# Patient Record
Sex: Female | Born: 1945
Health system: Southern US, Community
[De-identification: ages and names within clinical notes are randomized; demographics above are authoritative.]

## PROBLEM LIST (undated history)

## (undated) DIAGNOSIS — K828 Other specified diseases of gallbladder: Secondary | ICD-10-CM

## (undated) DIAGNOSIS — E785 Hyperlipidemia, unspecified: Secondary | ICD-10-CM

## (undated) DIAGNOSIS — I519 Heart disease, unspecified: Secondary | ICD-10-CM

## (undated) DIAGNOSIS — E119 Type 2 diabetes mellitus without complications: Secondary | ICD-10-CM

## (undated) DIAGNOSIS — S32010A Wedge compression fracture of first lumbar vertebra, initial encounter for closed fracture: Secondary | ICD-10-CM

## (undated) DIAGNOSIS — M8580 Other specified disorders of bone density and structure, unspecified site: Secondary | ICD-10-CM

## (undated) DIAGNOSIS — K219 Gastro-esophageal reflux disease without esophagitis: Secondary | ICD-10-CM

## (undated) DIAGNOSIS — I214 Non-ST elevation (NSTEMI) myocardial infarction: Secondary | ICD-10-CM

## (undated) DIAGNOSIS — R55 Syncope and collapse: Secondary | ICD-10-CM

## (undated) HISTORY — DX: Other specified disorders of bone density and structure, unspecified site: M85.80

## (undated) HISTORY — DX: Other specified diseases of gallbladder: K82.8

## (undated) HISTORY — PX: TONSILLECTOMY AND ADENOIDECTOMY: SUR1326

## (undated) HISTORY — DX: Hyperlipidemia, unspecified: E78.5

## (undated) HISTORY — DX: Heart disease, unspecified: I51.9

## (undated) HISTORY — PX: OTHER SURGICAL HISTORY: SHX169

---

## 2000-08-29 ENCOUNTER — Other Ambulatory Visit: Admission: RE | Admit: 2000-08-29 | Discharge: 2000-08-29 | Payer: Self-pay | Admitting: Obstetrics and Gynecology

## 2001-11-16 ENCOUNTER — Emergency Department (HOSPITAL_COMMUNITY): Admission: EM | Admit: 2001-11-16 | Discharge: 2001-11-16 | Payer: Self-pay | Admitting: Emergency Medicine

## 2005-03-30 ENCOUNTER — Ambulatory Visit: Payer: Self-pay | Admitting: Internal Medicine

## 2006-10-09 DIAGNOSIS — M8580 Other specified disorders of bone density and structure, unspecified site: Secondary | ICD-10-CM

## 2006-10-11 ENCOUNTER — Encounter: Payer: Self-pay | Admitting: Internal Medicine

## 2006-10-11 ENCOUNTER — Ambulatory Visit: Payer: Self-pay | Admitting: Internal Medicine

## 2006-10-11 DIAGNOSIS — M810 Age-related osteoporosis without current pathological fracture: Secondary | ICD-10-CM | POA: Insufficient documentation

## 2006-10-29 ENCOUNTER — Ambulatory Visit: Payer: Self-pay | Admitting: Internal Medicine

## 2006-11-01 LAB — CONVERTED CEMR LAB
Bilirubin, Direct: 0.1 mg/dL (ref 0.0–0.3)
CO2: 24 meq/L (ref 19–32)
Cholesterol: 242 mg/dL (ref 0–200)
Direct LDL: 170.7 mg/dL
GFR calc Af Amer: 82 mL/min
GFR calc non Af Amer: 68 mL/min
Glucose, Bld: 101 mg/dL — ABNORMAL HIGH (ref 70–99)
HDL: 35.7 mg/dL — ABNORMAL LOW (ref >39.0)
Sodium: 141 meq/L (ref 135–145)
Total CHOL/HDL Ratio: 6.8
Total Protein: 7.2 g/dL (ref 6.0–8.3)
Triglycerides: 132 mg/dL (ref 0–149)

## 2006-11-02 ENCOUNTER — Encounter (INDEPENDENT_AMBULATORY_CARE_PROVIDER_SITE_OTHER): Payer: Self-pay | Admitting: *Deleted

## 2006-12-18 ENCOUNTER — Ambulatory Visit: Payer: Self-pay | Admitting: Internal Medicine

## 2006-12-18 DIAGNOSIS — E785 Hyperlipidemia, unspecified: Secondary | ICD-10-CM | POA: Insufficient documentation

## 2006-12-18 DIAGNOSIS — E1169 Type 2 diabetes mellitus with other specified complication: Secondary | ICD-10-CM | POA: Insufficient documentation

## 2006-12-18 LAB — CONVERTED CEMR LAB
Cholesterol, target level: 200 mg/dL
HDL goal, serum: 40 mg/dL
LDL Goal: 130 mg/dL

## 2008-04-21 ENCOUNTER — Telehealth (INDEPENDENT_AMBULATORY_CARE_PROVIDER_SITE_OTHER): Payer: Self-pay | Admitting: *Deleted

## 2008-08-13 ENCOUNTER — Telehealth (INDEPENDENT_AMBULATORY_CARE_PROVIDER_SITE_OTHER): Payer: Self-pay | Admitting: *Deleted

## 2011-04-15 LAB — HM DIABETES EYE EXAM

## 2012-07-23 DIAGNOSIS — N6489 Other specified disorders of breast: Secondary | ICD-10-CM | POA: Diagnosis not present

## 2012-07-24 DIAGNOSIS — L819 Disorder of pigmentation, unspecified: Secondary | ICD-10-CM | POA: Diagnosis not present

## 2012-07-24 DIAGNOSIS — L821 Other seborrheic keratosis: Secondary | ICD-10-CM | POA: Diagnosis not present

## 2012-07-24 DIAGNOSIS — L820 Inflamed seborrheic keratosis: Secondary | ICD-10-CM | POA: Diagnosis not present

## 2012-07-24 DIAGNOSIS — D239 Other benign neoplasm of skin, unspecified: Secondary | ICD-10-CM | POA: Diagnosis not present

## 2012-07-24 DIAGNOSIS — D2370 Other benign neoplasm of skin of unspecified lower limb, including hip: Secondary | ICD-10-CM | POA: Diagnosis not present

## 2012-07-24 DIAGNOSIS — L909 Atrophic disorder of skin, unspecified: Secondary | ICD-10-CM | POA: Diagnosis not present

## 2012-07-24 DIAGNOSIS — D485 Neoplasm of uncertain behavior of skin: Secondary | ICD-10-CM | POA: Diagnosis not present

## 2012-07-24 DIAGNOSIS — L570 Actinic keratosis: Secondary | ICD-10-CM | POA: Diagnosis not present

## 2012-08-23 DIAGNOSIS — Z124 Encounter for screening for malignant neoplasm of cervix: Secondary | ICD-10-CM | POA: Diagnosis not present

## 2012-08-23 DIAGNOSIS — B373 Candidiasis of vulva and vagina: Secondary | ICD-10-CM | POA: Diagnosis not present

## 2012-08-23 DIAGNOSIS — Z01419 Encounter for gynecological examination (general) (routine) without abnormal findings: Secondary | ICD-10-CM | POA: Diagnosis not present

## 2012-08-23 DIAGNOSIS — Z0000 Encounter for general adult medical examination without abnormal findings: Secondary | ICD-10-CM | POA: Diagnosis not present

## 2012-08-23 DIAGNOSIS — R81 Glycosuria: Secondary | ICD-10-CM | POA: Diagnosis not present

## 2012-08-26 ENCOUNTER — Telehealth: Payer: Self-pay | Admitting: Internal Medicine

## 2012-08-26 DIAGNOSIS — Z331 Pregnant state, incidental: Secondary | ICD-10-CM | POA: Diagnosis not present

## 2012-08-26 NOTE — Telephone Encounter (Signed)
Patient states that she used to be a patient of Dr. Alwyn Ren. She would like to start seeing Alfonse Flavors again but knows that it has been years since she was last seen. She says that Dr. Alwyn Ren does see her husband Aneta Mins. Is it okay to put her in as a new patient for [facility] or does she need to see someone else? Thanks!

## 2012-08-26 NOTE — Telephone Encounter (Signed)
Please schedule followup  Medicare Wellness appointment . Please bring all actual pill bottles and supplements.

## 2012-08-27 NOTE — Telephone Encounter (Signed)
Spoke w/pt. She is filling a cancellation we have for tomorrow, 08/28/12.

## 2012-08-28 ENCOUNTER — Ambulatory Visit (INDEPENDENT_AMBULATORY_CARE_PROVIDER_SITE_OTHER): Payer: Medicare Other | Admitting: Internal Medicine

## 2012-08-28 ENCOUNTER — Encounter: Payer: Self-pay | Admitting: Internal Medicine

## 2012-08-28 VITALS — BP 118/70 | HR 106 | Temp 98.1°F | Resp 14 | Ht 61.08 in | Wt 137.0 lb

## 2012-08-28 DIAGNOSIS — E119 Type 2 diabetes mellitus without complications: Secondary | ICD-10-CM | POA: Insufficient documentation

## 2012-08-28 DIAGNOSIS — Z23 Encounter for immunization: Secondary | ICD-10-CM | POA: Diagnosis not present

## 2012-08-28 DIAGNOSIS — Z0000 Encounter for general adult medical examination without abnormal findings: Secondary | ICD-10-CM | POA: Diagnosis not present

## 2012-08-28 DIAGNOSIS — R7309 Other abnormal glucose: Secondary | ICD-10-CM | POA: Diagnosis not present

## 2012-08-28 DIAGNOSIS — M899 Disorder of bone, unspecified: Secondary | ICD-10-CM

## 2012-08-28 DIAGNOSIS — Z8249 Family history of ischemic heart disease and other diseases of the circulatory system: Secondary | ICD-10-CM

## 2012-08-28 DIAGNOSIS — N1832 Chronic kidney disease, stage 3b: Secondary | ICD-10-CM | POA: Insufficient documentation

## 2012-08-28 DIAGNOSIS — E1159 Type 2 diabetes mellitus with other circulatory complications: Secondary | ICD-10-CM | POA: Insufficient documentation

## 2012-08-28 DIAGNOSIS — M949 Disorder of cartilage, unspecified: Secondary | ICD-10-CM

## 2012-08-28 DIAGNOSIS — E785 Hyperlipidemia, unspecified: Secondary | ICD-10-CM | POA: Diagnosis not present

## 2012-08-28 DIAGNOSIS — Z139 Encounter for screening, unspecified: Secondary | ICD-10-CM

## 2012-08-28 NOTE — Patient Instructions (Addendum)
Preventive Health Care: Exercise  30-45  minutes a day, 3-4 days a week. Walking is especially valuable in preventing Osteoporosis. Eat a low-fat diet with lots of fruits and vegetables, up to 7-9 servings per day. This would eliminate need for vitamin supplements for most individuals. Consume less than 30 grams of sugar per day from foods & drinks with High Fructose Corn Syrup as #2,3 or #4 on label. The legal document "Health Care Power of Attorney & Living Will " verifies decisions concerning your health care. As per the Standard of Care , screening Colonoscopy recommended @ 50 & every 5-10 years thereafter . More frequent monitor would be dictated by family history or findings @ Colonoscopy. To prevent fast heart rate,palpitations or premature beats, avoid stimulants such as decongestants, diet pills, nicotine, or caffeine (coffee, tea, cola, or chocolate) to excess.  Please  schedule fasting Labs : BMET,NMR LIPOPROFILE PANEL, hepatic panel,vitamin D 1,25 [place] D3,A1c, urine microalbumin, CBC & dif, TSH.PLEASE BRING THESE INSTRUCTIONS TO FOLLOW UP  LAB APPOINTMENT.This will guarantee correct labs are drawn, eliminating need for repeat blood sampling ( needle sticks ! ). Diagnoses /Codes: 272.4, V17.3, 733.90,790.29.

## 2012-08-28 NOTE — Progress Notes (Signed)
Subjective:    Patient ID: Kathryn Logan, female    DOB: 05-07-1946, 67 y.o.   MRN: [id number]  HPI Medicare Wellness Visit:  Psychosocial & medical history were reviewed as required by Medicare (abuse,antisocial behavioral risks,firearm risk).  Social history: caffeine:occasionally  , alcohol:rarely   ,  tobacco use:  never  Exercise :  See below No home & personal  safety / fall risk Activities of daily living: no limitations  Seatbelt  and smoke alarm employed. Power of Attorney/Living Will status : needed Ophthalmology exam current Hearing evaluation not current Orientation :oriented X 3  Memory & recall :good Spelling  testing:good Mood & affect : normal . Depression / anxiety: denied Travel history : last 2010 [place]  Immunization status :Shingles /Flu/ PNA/ tetanus needed Transfusion history:  none  Preventive health surveillance ( colonoscopy, BMD , mammograms,PAP as per protocol/ SOC):  colonoscopy & BMD due Dental care:  Every 6 mos. Chart reviewed &  Updated. Active issues reviewed & addressed.      Review of Systems Nonfasting glucose was over 300 @ hergynecologist's office recently. He urged her to have a complete exam. She is on a heart healthy diet; she exercises as dancing & weights 30 minutes > 5 times per week without symptoms. Specifically she denies chest pain, palpitations, dyspnea, or claudication. Family history is negative for premature coronary disease . Advanced cholesterol testing never performed.There is no PMH of statin use. No myalgias. No colonoscopy to date.No significant abdominal symptoms .     Objective:   Physical Exam Gen.: Healthy and well-nourished in appearance. Alert, appropriate and cooperative throughout exam. Appears younger than stated age  Head: Normocephalic without obvious abnormalities Eyes: No corneal or conjunctival inflammation noted. Pupils equal round reactive to light and accommodation.  Extraocular motion intact.   Ears: External  ear exam reveals no significant lesions or deformities. Canals clear .TMs normal. Hearing is grossly normal bilaterally. Nose: External nasal exam reveals no deformity or inflammation. Nasal mucosa are pink and moist. No lesions or exudates noted.  Mouth: Oral mucosa and oropharynx reveal no lesions or exudates. Teeth in good repair. Neck: No deformities, masses, or tenderness noted. Range of motion &  Thyroid normal. Lungs: Normal respiratory effort; chest expands symmetrically. Lungs are clear to auscultation without rales, wheezes, or increased work of breathing. Heart: Increased  rate and regular  rhythm. Normal S1 and S2. S4 gallop, click, or rub. No murmur. Abdomen: Bowel sounds normal; abdomen soft and nontender. No masses, organomegaly or hernias noted. Genitalia: As per Dr Ambrose Mantle, Gyn                                  Musculoskeletal/extremities: Slightly accentuated curvature of upper thoracic spine. No clubbing, cyanosis, edema, or significant extremity  deformity noted. Range of motion normal .Tone & strength  Normal. Joints normal . Nail health good. Able to lie down & sit up w/o help. Negative SLR bilaterally Vascular: Carotid, radial artery, dorsalis pedis and  posterior tibial pulses are full and equal. No bruits present. Neurologic: Alert and oriented x3. Deep tendon reflexes symmetrical and normal.      Skin: Intact without suspicious lesions or rashes. Lymph: No cervical, axillary lymphadenopathy present. Psych: Mood and affect are normal. Normally interactive  Assessment & Plan:  #1 Medicare Wellness Exam; criteria met ; data entered #2 Problem List reviewed ; Assessment/ Recommendations made Plan: see Orders

## 2012-08-29 ENCOUNTER — Ambulatory Visit (INDEPENDENT_AMBULATORY_CARE_PROVIDER_SITE_OTHER): Payer: Medicare Other | Admitting: *Deleted

## 2012-08-29 DIAGNOSIS — R7309 Other abnormal glucose: Secondary | ICD-10-CM | POA: Diagnosis not present

## 2012-08-29 DIAGNOSIS — M899 Disorder of bone, unspecified: Secondary | ICD-10-CM | POA: Diagnosis not present

## 2012-08-29 DIAGNOSIS — M949 Disorder of cartilage, unspecified: Secondary | ICD-10-CM | POA: Diagnosis not present

## 2012-08-29 DIAGNOSIS — Z139 Encounter for screening, unspecified: Secondary | ICD-10-CM | POA: Diagnosis not present

## 2012-08-29 DIAGNOSIS — Z8249 Family history of ischemic heart disease and other diseases of the circulatory system: Secondary | ICD-10-CM | POA: Diagnosis not present

## 2012-08-29 DIAGNOSIS — Z23 Encounter for immunization: Secondary | ICD-10-CM

## 2012-08-29 DIAGNOSIS — E785 Hyperlipidemia, unspecified: Secondary | ICD-10-CM | POA: Diagnosis not present

## 2012-08-29 LAB — CBC WITH DIFFERENTIAL/PLATELET
Basophils Absolute: 0 10*3/uL (ref 0.0–0.1)
HCT: 38.1 % (ref 36.0–46.0)
Lymphocytes Relative: 33.7 % (ref 12.0–46.0)
Lymphs Abs: 2.6 10*3/uL (ref 0.7–4.0)
Monocytes Relative: 5.6 % (ref 3.0–12.0)
Neutrophils Relative %: 57.8 % (ref 43.0–77.0)
Platelets: 280 10*3/uL (ref 150.0–400.0)
RDW: 14.9 % — ABNORMAL HIGH (ref 11.5–14.6)
WBC: 7.7 10*3/uL (ref 4.5–10.5)

## 2012-08-29 LAB — HEPATIC FUNCTION PANEL
Albumin: 4.1 g/dL (ref 3.5–5.2)
Total Bilirubin: 0.6 mg/dL (ref 0.3–1.2)

## 2012-08-29 LAB — BASIC METABOLIC PANEL
BUN: 21 mg/dL (ref 6–23)
Calcium: 9.5 mg/dL (ref 8.4–10.5)
GFR: 87.72 mL/min (ref >60.00)
Glucose, Bld: 235 mg/dL — ABNORMAL HIGH (ref 70–99)

## 2012-08-29 LAB — HEMOGLOBIN A1C: Hgb A1c MFr Bld: 10.2 % — ABNORMAL HIGH (ref 4.6–6.5)

## 2012-08-30 ENCOUNTER — Other Ambulatory Visit: Payer: Self-pay | Admitting: Internal Medicine

## 2012-08-30 DIAGNOSIS — M899 Disorder of bone, unspecified: Secondary | ICD-10-CM | POA: Diagnosis not present

## 2012-08-30 DIAGNOSIS — Z139 Encounter for screening, unspecified: Secondary | ICD-10-CM | POA: Diagnosis not present

## 2012-08-30 DIAGNOSIS — E785 Hyperlipidemia, unspecified: Secondary | ICD-10-CM | POA: Diagnosis not present

## 2012-08-30 DIAGNOSIS — Z8249 Family history of ischemic heart disease and other diseases of the circulatory system: Secondary | ICD-10-CM | POA: Diagnosis not present

## 2012-08-30 DIAGNOSIS — R7309 Other abnormal glucose: Secondary | ICD-10-CM | POA: Diagnosis not present

## 2012-08-30 LAB — MICROALBUMIN / CREATININE URINE RATIO: Microalb Creat Ratio: 0.8 mg/g (ref 0.0–30.0)

## 2012-08-30 MED ORDER — GLIMEPIRIDE 2 MG PO TABS: ORAL_TABLET | ORAL | Status: DC

## 2012-08-30 MED ORDER — METFORMIN HCL 500 MG PO TABS: 500.0000 mg | ORAL_TABLET | Freq: Two times a day (BID) | ORAL | Status: DC

## 2012-08-30 NOTE — Addendum Note (Signed)
Addended by: Silvio Pate D on: 08/30/2012 09:07 AM   Modules accepted: Orders

## 2012-09-02 ENCOUNTER — Encounter: Payer: Self-pay | Admitting: Internal Medicine

## 2012-09-02 LAB — VITAMIN D 1,25 DIHYDROXY
Vitamin D 1, 25 (OH)2 Total: 43 pg/mL (ref 18–72)
Vitamin D2 1, 25 (OH)2: <8 pg/mL
Vitamin D3 1, 25 (OH)2: 43 pg/mL

## 2012-09-02 LAB — NMR LIPOPROFILE WITH LIPIDS
Cholesterol, Total: 249 mg/dL — ABNORMAL HIGH (ref <200)
LDL Particle Number: 2747 nmol/L — ABNORMAL HIGH (ref <1000)
Large HDL-P: <1.3 umol/L — ABNORMAL LOW (ref >=4.8)
Large VLDL-P: 1.2 nmol/L (ref <=2.7)
Small LDL Particle Number: 1624 nmol/L — ABNORMAL HIGH (ref <=527)
VLDL Size: 42.9 nm (ref <=46.6)

## 2012-09-03 ENCOUNTER — Telehealth: Payer: Self-pay | Admitting: Internal Medicine

## 2012-09-03 NOTE — Telephone Encounter (Signed)
PT called regarding there blood sugar test. Please call pt as soon as possible because she is going out of town and wanted to know before she went. thanks

## 2012-09-03 NOTE — Telephone Encounter (Signed)
Spoke with patient, patient was encouraged to set up Mychart and she can have quicker access to her lab results. Patient verbalized understanding. Patient was given lab values and scheduled appointment for Monday for glucometer teaching. Patient aware copy of report(s) mailed

## 2012-09-09 ENCOUNTER — Ambulatory Visit (INDEPENDENT_AMBULATORY_CARE_PROVIDER_SITE_OTHER): Payer: Medicare Other | Admitting: Internal Medicine

## 2012-09-09 VITALS — HR 100 | Wt 137.0 lb

## 2012-09-09 MED ORDER — GLUCOSE BLOOD VI STRP: ORAL_STRIP | Status: DC

## 2012-09-09 MED ORDER — ONETOUCH DELICA LANCETS FINE MISC: 1.0000 | Status: DC

## 2012-09-09 NOTE — Patient Instructions (Signed)
Patient verbalized understanding of Blood Sugar machine teaching. Patient advised to check BS Fasting M/W/F/Sun and Tue (2 hours after breakfast), Thurs (2 hours after lunch), then Sat (2 hours after dinner). Patient to bring BS reading to appointment on May 20,2014. Patient to call if questions or concerns about Glucose Monitor teaching

## 2012-09-10 ENCOUNTER — Telehealth: Payer: Self-pay | Admitting: Internal Medicine

## 2012-09-10 DIAGNOSIS — Z8262 Family history of osteoporosis: Secondary | ICD-10-CM | POA: Diagnosis not present

## 2012-09-10 DIAGNOSIS — M899 Disorder of bone, unspecified: Secondary | ICD-10-CM | POA: Diagnosis not present

## 2012-09-10 NOTE — Telephone Encounter (Signed)
Patient has blood on a strip for a One Touch. She doesn't know how to use it, please call her.

## 2012-09-10 NOTE — Telephone Encounter (Signed)
Left message with patient's husband to have patient return call when available.  Called patient on cell phone, patient was placing the blood on the strip before placing it into the machine, patient was instructed to place strip into machine first then place blood on strip when prompted by machine. Patient verbalized understanding.

## 2012-09-10 NOTE — Telephone Encounter (Signed)
Left message on voicemail for patient to return call when available   

## 2012-09-10 NOTE — Progress Notes (Signed)
  Subjective:    Patient ID: Kathryn Logan, female    DOB: [birth year], 67 y.o.   MRN: [id number]  HPI    Review of Systems     Objective:   Physical Exam        Assessment & Plan:

## 2012-09-10 NOTE — Assessment & Plan Note (Signed)
Glucometer teaching completed

## 2012-09-15 ENCOUNTER — Encounter: Payer: Self-pay | Admitting: Internal Medicine

## 2012-09-27 DIAGNOSIS — L280 Lichen simplex chronicus: Secondary | ICD-10-CM | POA: Diagnosis not present

## 2012-10-01 ENCOUNTER — Encounter: Payer: Self-pay | Admitting: Internal Medicine

## 2012-10-01 ENCOUNTER — Ambulatory Visit: Payer: Medicare Other | Admitting: Internal Medicine

## 2012-10-08 ENCOUNTER — Encounter: Payer: Self-pay | Admitting: Internal Medicine

## 2012-10-08 ENCOUNTER — Ambulatory Visit (INDEPENDENT_AMBULATORY_CARE_PROVIDER_SITE_OTHER): Payer: Medicare Other | Admitting: Internal Medicine

## 2012-10-08 VITALS — BP 118/76 | HR 126 | Wt 133.4 lb

## 2012-10-08 DIAGNOSIS — M949 Disorder of cartilage, unspecified: Secondary | ICD-10-CM

## 2012-10-08 DIAGNOSIS — M899 Disorder of bone, unspecified: Secondary | ICD-10-CM | POA: Diagnosis not present

## 2012-10-08 DIAGNOSIS — E785 Hyperlipidemia, unspecified: Secondary | ICD-10-CM

## 2012-10-08 DIAGNOSIS — R7402 Elevation of levels of lactic acid dehydrogenase (LDH): Secondary | ICD-10-CM

## 2012-10-08 DIAGNOSIS — IMO0001 Reserved for inherently not codable concepts without codable children: Secondary | ICD-10-CM

## 2012-10-08 NOTE — Patient Instructions (Addendum)
Please  schedule fasting Labs in 12 weeks after continuing present exercise & nutrition changes: A1c, urine microalbumin,Lipids,AST,ALT. PLEASE BRING THESE INSTRUCTIONS TO FOLLOW UP  LAB APPOINTMENT.This will guarantee correct labs are drawn, eliminating need for repeat blood sampling ( needle sticks ! ). Diagnoses /Codes: 250.02,272.4.  Annual  retinal assessments are indicated because the diabetes.

## 2012-10-08 NOTE — Progress Notes (Signed)
  Subjective:    Patient ID: Kathryn Logan, female    DOB: 19-Sep-1945, 67 y.o.   MRN: [id number]  HPI Diabetes status assessment : Uncontrolled, pt has not filled medication yet Fasting or morning glucose range: 130- 204 Highest glucose 2 hours after any meal :150- 299. Hypoglycemia : none                                                                   Significant change in  Weight :4 lb weight loss since last visit .   Exercise : walking 5 days per week, one hour per occasion.  Nutrition/diet : Limiting sugar, simple carbohydrates, and sweets.                                                                                Medication compliance:  Has not began medication regimen. Adverse  Medication effects:  Have not started medication. Eye exam : Last exam in 2011 Foot care:  Never A1c/ urine microalbumin monitor:  Last A1C  10.2%, microalbumin ratio 0.8   Review of Systems   ROS: Excess thirst /  hunger  / urination:  none.  Lightheadedness with standing:none . Chest pain:  None . Palpitations: None . Pain in  calves with walking : None . Non healing skin  ulcers or sores :None.  Numbness , tingling or burning in feet :None .   Vision changes:  No      Objective:   Physical Exam  Appears healthy and well-nourished & in no acute distress  No carotid bruits are present.No neck pain distention present at 10 - 15 degrees. Thyroid normal to palpation  Heart rhythm and rate are normal with no significant murmurs or gallops.  Chest is clear with no increased work of breathing  There is no evidence of aortic aneurysm or renal artery bruits  Abdomen soft with no organomegaly or masses. No HJR  No clubbing, cyanosis or edema present.  Pedal pulses are intact   No ischemic skin changes are present . Nails healthy    Alert and oriented. Strength, tone, DTRs reflexes normal. Light touch normal over feet.          Assessment & Plan:  See Current Assessment & Plan in  Problem List under specific Diagnosis

## 2012-10-08 NOTE — Assessment & Plan Note (Signed)
She is engaged in a very aggressive lifestyle change program both nutritionally and with exercise. Metformin twice a day with the largest meals would be recommended for insulin sensitization. The A1c will be rechecked in 10-[redacted] weeks along with lipids

## 2012-10-08 NOTE — Assessment & Plan Note (Signed)
   Focus will be on continuing the weight bearing exercises which she is now doing 6 days a week along with continuing the calcium and vitamin D supplementation. Her concerns about negative dental impact was discussed. Bisphosphonate would not be necessary unless there is progression of bone loss. Long-term risk  related to osteopenia discussed, especially at the femoral neck

## 2012-10-08 NOTE — Assessment & Plan Note (Signed)
Statin will not be initiated until we see the response of her fasting lipids to be continuing the excellent nutritional and exercise program in  10-12 weeks.

## 2012-10-28 DIAGNOSIS — L280 Lichen simplex chronicus: Secondary | ICD-10-CM | POA: Diagnosis not present

## 2012-11-12 LAB — HM MAMMOGRAPHY

## 2012-12-17 DIAGNOSIS — L439 Lichen planus, unspecified: Secondary | ICD-10-CM | POA: Diagnosis not present

## 2012-12-17 DIAGNOSIS — L821 Other seborrheic keratosis: Secondary | ICD-10-CM | POA: Diagnosis not present

## 2012-12-17 DIAGNOSIS — L919 Hypertrophic disorder of the skin, unspecified: Secondary | ICD-10-CM | POA: Diagnosis not present

## 2012-12-17 DIAGNOSIS — D485 Neoplasm of uncertain behavior of skin: Secondary | ICD-10-CM | POA: Diagnosis not present

## 2012-12-17 DIAGNOSIS — D235 Other benign neoplasm of skin of trunk: Secondary | ICD-10-CM | POA: Diagnosis not present

## 2012-12-31 ENCOUNTER — Other Ambulatory Visit: Payer: Medicare Other

## 2013-01-23 ENCOUNTER — Other Ambulatory Visit: Payer: Medicare Other

## 2013-02-03 ENCOUNTER — Other Ambulatory Visit: Payer: Medicare Other

## 2013-02-18 ENCOUNTER — Other Ambulatory Visit: Payer: Medicare Other

## 2013-03-04 ENCOUNTER — Other Ambulatory Visit (INDEPENDENT_AMBULATORY_CARE_PROVIDER_SITE_OTHER): Payer: Medicare Other

## 2013-03-04 ENCOUNTER — Encounter: Payer: Self-pay | Admitting: Internal Medicine

## 2013-03-04 DIAGNOSIS — IMO0001 Reserved for inherently not codable concepts without codable children: Secondary | ICD-10-CM

## 2013-03-04 DIAGNOSIS — E785 Hyperlipidemia, unspecified: Secondary | ICD-10-CM | POA: Diagnosis not present

## 2013-03-04 LAB — ALT: ALT: 25 U/L (ref 0–35)

## 2013-03-04 LAB — HEMOGLOBIN A1C: Hgb A1c MFr Bld: 7 % — ABNORMAL HIGH (ref 4.6–6.5)

## 2013-03-04 LAB — LIPID PANEL
Cholesterol: 236 mg/dL — ABNORMAL HIGH (ref 0–200)
Triglycerides: 137 mg/dL (ref 0.0–149.0)

## 2013-03-04 LAB — MICROALBUMIN / CREATININE URINE RATIO: Microalb Creat Ratio: 0.7 mg/g (ref 0.0–30.0)

## 2013-03-05 ENCOUNTER — Encounter: Payer: Self-pay | Admitting: *Deleted

## 2013-03-05 NOTE — Progress Notes (Signed)
Letter mailed to patient.

## 2013-03-06 ENCOUNTER — Other Ambulatory Visit: Payer: Self-pay | Admitting: Internal Medicine

## 2013-03-06 ENCOUNTER — Telehealth: Payer: Self-pay | Admitting: General Practice

## 2013-03-06 NOTE — Telephone Encounter (Signed)
Pt called and is requesting that her recent labs be released to her myChart.

## 2013-03-06 NOTE — Telephone Encounter (Signed)
See notes on 03/04/13 lab reports ( ? Released through My Chart already?) .She needs F/U before refilling meds

## 2013-03-06 NOTE — Telephone Encounter (Signed)
Noted  

## 2013-03-07 NOTE — Telephone Encounter (Signed)
Med filled.  

## 2013-03-20 ENCOUNTER — Other Ambulatory Visit: Payer: Self-pay

## 2013-04-14 ENCOUNTER — Encounter: Payer: Self-pay | Admitting: Internal Medicine

## 2013-04-14 ENCOUNTER — Ambulatory Visit (INDEPENDENT_AMBULATORY_CARE_PROVIDER_SITE_OTHER): Payer: Medicare Other | Admitting: Internal Medicine

## 2013-04-14 VITALS — BP 142/89 | HR 109 | Temp 98.6°F | Ht 61.07 in | Wt 140.4 lb

## 2013-04-14 DIAGNOSIS — J209 Acute bronchitis, unspecified: Secondary | ICD-10-CM | POA: Diagnosis not present

## 2013-04-14 DIAGNOSIS — E785 Hyperlipidemia, unspecified: Secondary | ICD-10-CM

## 2013-04-14 MED ORDER — AZITHROMYCIN 250 MG PO TABS: ORAL_TABLET | ORAL | Status: DC

## 2013-04-14 MED ORDER — ATORVASTATIN CALCIUM 20 MG PO TABS: 20.0000 mg | ORAL_TABLET | Freq: Every day | ORAL | Status: DC

## 2013-04-14 NOTE — Progress Notes (Signed)
   Subjective:    Patient ID: Kathryn Logan, female    DOB: 09/26/1945, 67 y.o.   MRN: [id number]  HPI   Symptoms began 04/11/13 as rhinitis associated with itchy, watery eyes and sneezing.  As of 11/29 she developed chest congestion with production of yellow sputum. She also describes low-grade fever, chills, sweats. The only treatment to date was Mucinex.  She is only now returning to discuss DM & lipids    Review of Systems She specifically denies frontal or maxillary sinus area pain, nasal purulence, dental pain, otic pain, or otic discharge  The cough was not associated with shortness of breath or wheezing.  Her fasting glucoses are 124-157. Her A1c dropped from 10.2% down to the value of 710/21/14. Her wrist has dropped from the 104% down to 40%. The average sugar would drop from 285 down to 172.  Her LDL on 10/21 was 179 ; HDL 40.4; & Triglycerides were excellent at 137. A modified  heart healthy diet is followed; exercise is sporadic .  Family history is positive for premature coronary disease in her father. With DM  LDL goal is less than 100 ; ideally < 70 . To date no statin.  Low dose ASA taken Specifically denied are  chest pain, palpitations, dyspnea, or claudication.      Objective:   Physical Exam General appearance:good health ;well nourished; no acute distress or increased work of breathing is present.  No  lymphadenopathy about the head, neck, or axilla noted.   Eyes: No conjunctival inflammation or lid edema is present. There is no scleral icterus.  Ears:  External ear exam shows no significant lesions or deformities.  Otoscopic examination reveals clear canals, tympanic membranes are intact bilaterally without bulging, retraction, inflammation or discharge.  Nose:  External nasal examination shows no deformity or inflammation. Nasal mucosa are pink and moist without lesions or exudates. No septal dislocation or deviation.No obstruction to airflow.   Oral  exam: Dental hygiene is good; lips and gums are healthy appearing.There is no oropharyngeal erythema or exudate noted.   Neck:  No deformities, masses, or tenderness noted.   Supple with full range of motion without pain.   Heart:  Normal rate and regular rhythm. S1 and S2 normal without gallop, murmur, click, rub or other extra sounds.   Lungs:Chest clear to auscultation; no wheezes, rhonchi,rales ,or rubs present.No increased work of breathing.    Extremities:  No cyanosis, edema, or clubbing  noted    Skin: Warm & dry w/o jaundice or tenting.         Assessment & Plan:  #1 acute bronchitis w/o bronchospasm #2 DM improved #3 dyslipidemia Plan: See orders and recommendations

## 2013-04-14 NOTE — Progress Notes (Signed)
Pre visit review using our clinic review tool, if applicable. No additional management support is needed unless otherwise documented below in the visit note. 

## 2013-04-14 NOTE — Patient Instructions (Signed)
Fasting labs 07/11/13; OV 3-4 days later Plain Mucinex (NOT D) for thick secretions ;force NON dairy fluids .   Nasal cleansing in the shower as discussed with lather of mild shampoo.After 10 seconds wash off lather while  exhaling through nostrils. Make sure that all residual soap is removed to prevent irritation.  Nasacort AQ OTC 1 spray in each nostril twice a day as needed. Use the "crossover" technique into opposite nostril spraying toward opposite ear @ 45 degree angle, not straight up into nostril.  Use a Neti pot daily only  as needed for significant sinus congestion; going from open side to congested side . Plain Allegra (NOT D )  160 daily , Loratidine 10 mg , OR Zyrtec 10 mg @ bedtime  as needed for itchy eyes & sneezing.

## 2013-05-27 ENCOUNTER — Other Ambulatory Visit: Payer: Self-pay | Admitting: Internal Medicine

## 2013-05-27 NOTE — Telephone Encounter (Signed)
Metformin refilled per protocol. JG//CMA

## 2013-06-17 ENCOUNTER — Telehealth: Payer: Self-pay

## 2013-06-17 NOTE — Telephone Encounter (Signed)
The patient called and is hoping to get an rx called in for congestion.  She states this same medication was called in for a while ago for the same problem   Callback [phone]

## 2013-06-18 ENCOUNTER — Encounter: Payer: Self-pay | Admitting: Internal Medicine

## 2013-06-18 ENCOUNTER — Other Ambulatory Visit (INDEPENDENT_AMBULATORY_CARE_PROVIDER_SITE_OTHER): Payer: Medicare Other

## 2013-06-18 ENCOUNTER — Other Ambulatory Visit: Payer: Self-pay | Admitting: Internal Medicine

## 2013-06-18 ENCOUNTER — Ambulatory Visit (INDEPENDENT_AMBULATORY_CARE_PROVIDER_SITE_OTHER): Payer: Medicare Other | Admitting: Internal Medicine

## 2013-06-18 VITALS — BP 118/80 | HR 121 | Temp 97.4°F | Wt 139.0 lb

## 2013-06-18 DIAGNOSIS — R000 Tachycardia, unspecified: Secondary | ICD-10-CM | POA: Diagnosis not present

## 2013-06-18 DIAGNOSIS — IMO0001 Reserved for inherently not codable concepts without codable children: Secondary | ICD-10-CM

## 2013-06-18 DIAGNOSIS — J209 Acute bronchitis, unspecified: Secondary | ICD-10-CM

## 2013-06-18 DIAGNOSIS — E1165 Type 2 diabetes mellitus with hyperglycemia: Principal | ICD-10-CM

## 2013-06-18 LAB — CBC WITH DIFFERENTIAL/PLATELET
BASOS PCT: 1.7 % (ref 0.0–3.0)
Basophils Absolute: 0.2 10*3/uL — ABNORMAL HIGH (ref 0.0–0.1)
EOS ABS: 0.3 10*3/uL (ref 0.0–0.7)
EOS PCT: 3 % (ref 0.0–5.0)
HEMATOCRIT: 41.8 % (ref 36.0–46.0)
Hemoglobin: 13.9 g/dL (ref 12.0–15.0)
LYMPHS ABS: 2.9 10*3/uL (ref 0.7–4.0)
Lymphocytes Relative: 27.6 % (ref 12.0–46.0)
MCHC: 33.1 g/dL (ref 30.0–36.0)
MCV: 87.9 fl (ref 78.0–100.0)
MONO ABS: 0.6 10*3/uL (ref 0.1–1.0)
Monocytes Relative: 5.8 % (ref 3.0–12.0)
NEUTROS PCT: 61.9 % (ref 43.0–77.0)
Neutro Abs: 6.5 10*3/uL (ref 1.4–7.7)
Platelets: 358 10*3/uL (ref 150.0–400.0)
RBC: 4.76 Mil/uL (ref 3.87–5.11)
RDW: 15.3 % — ABNORMAL HIGH (ref 11.5–14.6)
WBC: 10.5 10*3/uL (ref 4.5–10.5)

## 2013-06-18 LAB — BASIC METABOLIC PANEL
BUN: 29 mg/dL — ABNORMAL HIGH (ref 6–23)
CHLORIDE: 99 meq/L (ref 96–112)
CO2: 26 meq/L (ref 19–32)
CREATININE: 0.8 mg/dL (ref 0.4–1.2)
Calcium: 11.2 mg/dL — ABNORMAL HIGH (ref 8.4–10.5)
GFR: 82.14 mL/min (ref >60.00)
Glucose, Bld: 194 mg/dL — ABNORMAL HIGH (ref 70–99)
POTASSIUM: 4.2 meq/L (ref 3.5–5.1)
Sodium: 138 mEq/L (ref 135–145)

## 2013-06-18 LAB — HEMOGLOBIN A1C: HEMOGLOBIN A1C: 8.6 % — AB (ref 4.6–6.5)

## 2013-06-18 LAB — TSH: TSH: 2.81 u[IU]/mL (ref 0.35–5.50)

## 2013-06-18 MED ORDER — AMOXICILLIN 500 MG PO CAPS: 500.0000 mg | ORAL_CAPSULE | Freq: Three times a day (TID) | ORAL | Status: DC

## 2013-06-18 NOTE — Patient Instructions (Addendum)
Plain Mucinex (NOT D) for thick secretions ;force NON dairy fluids .   Nasal cleansing in the shower as discussed with lather of mild shampoo.After 10 seconds wash off lather while  exhaling through nostrils. Make sure that all residual soap is removed to prevent irritation.  Flonase OR Nasacort AQ 1 spray in each nostril twice a day as needed. Use the "crossover" technique into opposite nostril spraying toward opposite ear @ 45 degree angle, not straight up into nostril.  Use a Neti pot daily only  as needed for significant sinus congestion; going from open side to congested side . Plain Allegra (NOT D )  160 daily , Loratidine 10 mg , OR Zyrtec 10 mg @ bedtime  as needed for itchy eyes & sneezing. To prevent fast heart rates avoid stimulants such as decongestants, diet pills, nicotine, or caffeine (coffee, tea, cola, or chocolate) to excess.

## 2013-06-18 NOTE — Progress Notes (Signed)
Pre visit review using our clinic review tool, if applicable. No additional management support is needed unless otherwise documented below in the visit note. 

## 2013-06-18 NOTE — Progress Notes (Signed)
   Subjective:    Patient ID: Kathryn Logan, female    DOB: July 20, 1945, 68 y.o.   MRN: [id number]  HPI   Symptoms began 06/16/13 as head congestion. She also had some sneezing.  Subsequently she has had cough with production of yellow sputum.  Mucinex D was of no significant benefit    She has not smoked and has no history of lung disease.    Review of Systems She specifically denies frontal sinus or maxillary sinus pain. She has no dental pain, sore throat, otic pain, or otic discharge  The cough is not associated with shortness of breath or wheezing  There's been no associated fever, chills, or sweats  The sneezing was not associated with significant itchy, watery eyes.  See pulse rate; verified x3. She been taking Mucinex D 2 every 12 hours for the last several days. She denies chest pain or palpitations.  FBS 111-147    Objective:   Physical Exam General appearance:good health ;well nourished; no acute distress or increased work of breathing is present.  No  lymphadenopathy about the head, neck, or axilla noted.   Eyes: No conjunctival inflammation or lid edema is present. Ears:  External ear exam shows no significant lesions or deformities.  Otoscopic examination reveals clear canals, tympanic membranes are intact bilaterally without bulging, retraction, inflammation or discharge.  Nose:  External nasal examination shows no deformity or inflammation. Nasal mucosa are pink and moist without lesions or exudates. No septal dislocation or deviation.No obstruction to airflow.   Oral exam: Dental hygiene is good; lips and gums are healthy appearing.There is no oropharyngeal erythema or exudate noted.   Neck:  No deformities,  masses, or tenderness noted.   Supple with full range of motion without pain. Thyroid normal  Heart:  Fast rate and regular rhythm. S1 and S2 normal without gallop, murmur, click, rub or other extra sounds.   Lungs:Chest clear to auscultation; no  wheezes, rhonchi,rales ,or rubs present.No increased work of breathing.    Extremities:  No cyanosis, edema, or clubbing  noted    Skin: Warm & dry w/o jaundice or tenting.         Assessment & Plan:  #1 acute bronchitis  #2 tachycardia, probably due to Mucinex D.EKG reveals tach ,NOT a flutter  #3 diabetes questionable status  Plan: See orders

## 2013-06-18 NOTE — Telephone Encounter (Signed)
Pt was seen today by Dr. Feliz Beam

## 2013-06-21 DIAGNOSIS — J069 Acute upper respiratory infection, unspecified: Secondary | ICD-10-CM | POA: Diagnosis not present

## 2013-07-11 ENCOUNTER — Other Ambulatory Visit: Payer: Medicare Other

## 2013-07-15 ENCOUNTER — Ambulatory Visit: Payer: Medicare Other | Admitting: Internal Medicine

## 2013-07-24 ENCOUNTER — Other Ambulatory Visit: Payer: Self-pay | Admitting: *Deleted

## 2013-07-24 MED ORDER — GLUCOSE BLOOD VI STRP: ORAL_STRIP | Status: DC

## 2013-07-24 NOTE — Telephone Encounter (Signed)
Rx sent to the pharmacy by e-script.//AB/CMA 

## 2013-08-04 ENCOUNTER — Encounter: Payer: Self-pay | Admitting: Internal Medicine

## 2013-08-04 ENCOUNTER — Ambulatory Visit (INDEPENDENT_AMBULATORY_CARE_PROVIDER_SITE_OTHER): Payer: Medicare Other | Admitting: Internal Medicine

## 2013-08-04 VITALS — BP 128/80 | HR 117 | Temp 97.6°F | Wt 135.0 lb

## 2013-08-04 DIAGNOSIS — E1165 Type 2 diabetes mellitus with hyperglycemia: Principal | ICD-10-CM

## 2013-08-04 DIAGNOSIS — A084 Viral intestinal infection, unspecified: Secondary | ICD-10-CM

## 2013-08-04 DIAGNOSIS — A088 Other specified intestinal infections: Secondary | ICD-10-CM | POA: Diagnosis not present

## 2013-08-04 DIAGNOSIS — IMO0001 Reserved for inherently not codable concepts without codable children: Secondary | ICD-10-CM

## 2013-08-04 MED ORDER — SITAGLIPTIN PHOSPHATE 100 MG PO TABS: 100.0000 mg | ORAL_TABLET | Freq: Every day | ORAL | Status: DC

## 2013-08-04 NOTE — Progress Notes (Signed)
Subjective:    Patient ID: Kathryn Logan, female    DOB: 1946/04/25, 68 y.o.   MRN: [id number]  HPI Diabetes status assessment: Fasting or morning glucose range 124-154. Highest glucose 2 hours after any meal is <175. No hypoglycemia reported                                                                                                                 Regular exercise described as walking   6 times per week for 60  minutes. Specific nutrition/diet : no sugar, low glycemic carbs Medication compliance is good. No medication adverse effects noted. Eye exam not current. Foot care not current  A1c 8.6% last month.  She was exposed to a woman last week who may have had Flu on a bus trip. As of 3/20 she's had some nausea and 2 days of loose but not watery stool.  Her calcium was 11.2 one month ago; she has stopped all calcium supplements as well as vitamin D supplements.        Review of Systems No excess thirst ;  excess hunger ; or excess urination reported                              No lightheadedness with standing reported No chest pain ; palpitations ; claudication described .                                                                                                                       No non healing skin  ulcers or sores of extremities noted. No numbness or tingling or burning in feet described                                                                                                                                             Change  in weight of  4 pound  loss. No blurred,double, or loss of vision reported  .    She specifically denies frontal headache, facial pain, nasal purulence, otic pain, otic discharge. She has no sore throat, dental pain, cough, or sputum production.     Objective:   Physical Exam Gen.: Healthy and well-nourished in appearance. Alert, appropriate and cooperative throughout exam. Appears younger than stated age   Eyes: No corneal or  conjunctival inflammation noted.  Ears: External  ear exam reveals no significant lesions or deformities. Canals clear .TMs normal. Hearing is grossly normal bilaterally. Nose: External nasal exam reveals no deformity or inflammation. Nasal mucosa are pink and moist. No lesions or exudates noted.   Mouth: Oral mucosa and oropharynx reveal no lesions or exudates. Teeth in good repair. Neck: No deformities, masses, or tenderness noted. Thyroid normal Lungs: Normal respiratory effort; chest expands symmetrically. Lungs are clear to auscultation without rales, wheezes, or increased work of breathing. Heart: Normal rate and rhythm. Normal S1 and S2. No gallop, click, or rub. No murmur. Abdomen: Bowel sounds normal; abdomen soft and nontender. No masses, organomegaly or hernias noted.                    Musculoskeletal/extremities:  No clubbing, cyanosis, edema, or significant extremity  deformity noted. Range of motion normal .Tone & strength normal. Hand joints normal Able to lie down & sit up w/o help.  Vascular: Carotid, radial artery, dorsalis pedis and  posterior tibial pulses are full and equal. No bruits present. Neurologic: Alert and oriented x3.  Gait normal  .      Skin: Intact without suspicious lesions or rashes. Lymph: No cervical, axillary lymphadenopathy present. Psych: Mood and affect are normal. Normally interactive                                                                                        Assessment & Plan:  #1 uncontrolled diabetes  #2 hypercalcemia  #3 viral gastritis  See orders

## 2013-08-04 NOTE — Progress Notes (Signed)
Pre visit review using our clinic review tool, if applicable. No additional management support is needed unless otherwise documented below in the visit note. 

## 2013-08-04 NOTE — Patient Instructions (Signed)
Please do not take times, calcium supplements, or vitamin D supplements. Have the calcium checked in the morning fasting. Please take the probiotic , Align, every day until the bowels are normal. This will replace the normal bacteria which  are necessary for formation of normal stool and processing of food.  Immodium AD as needed for frank diarrhea    Your A1c will be rechecked in early May  If Januvia is not on your formulary; verify which agent in this class is.

## 2013-08-05 ENCOUNTER — Other Ambulatory Visit: Payer: Self-pay

## 2013-08-05 ENCOUNTER — Other Ambulatory Visit: Payer: Self-pay | Admitting: Internal Medicine

## 2013-08-05 ENCOUNTER — Encounter: Payer: Self-pay | Admitting: Internal Medicine

## 2013-08-05 ENCOUNTER — Other Ambulatory Visit (INDEPENDENT_AMBULATORY_CARE_PROVIDER_SITE_OTHER): Payer: Medicare Other

## 2013-08-05 DIAGNOSIS — IMO0001 Reserved for inherently not codable concepts without codable children: Secondary | ICD-10-CM

## 2013-08-05 DIAGNOSIS — E1165 Type 2 diabetes mellitus with hyperglycemia: Principal | ICD-10-CM

## 2013-08-05 DIAGNOSIS — E785 Hyperlipidemia, unspecified: Secondary | ICD-10-CM

## 2013-08-05 LAB — HEMOGLOBIN A1C: Hgb A1c MFr Bld: 7.5 % — ABNORMAL HIGH (ref 4.6–6.5)

## 2013-08-05 LAB — CALCIUM: CALCIUM: 9.4 mg/dL (ref 8.4–10.5)

## 2013-08-05 MED ORDER — PIOGLITAZONE HCL 30 MG PO TABS: 30.0000 mg | ORAL_TABLET | Freq: Every day | ORAL | Status: DC

## 2013-08-05 MED ORDER — ATORVASTATIN CALCIUM 20 MG PO TABS: 20.0000 mg | ORAL_TABLET | Freq: Every day | ORAL | Status: DC

## 2013-08-05 NOTE — Telephone Encounter (Signed)
Refill for lipitor 20 mg filled, pt needs to schedule an appointment in order to receive further refills

## 2013-08-05 NOTE — Telephone Encounter (Signed)
Refilled ; lipids will be rechecked with A1c in 11 weeks

## 2013-08-08 DIAGNOSIS — J019 Acute sinusitis, unspecified: Secondary | ICD-10-CM | POA: Diagnosis not present

## 2013-08-12 ENCOUNTER — Telehealth: Payer: Self-pay

## 2013-08-12 NOTE — Telephone Encounter (Signed)
Relevant patient education assigned to patient using Emmi. ° °

## 2013-08-20 DIAGNOSIS — D2370 Other benign neoplasm of skin of unspecified lower limb, including hip: Secondary | ICD-10-CM | POA: Diagnosis not present

## 2013-08-20 DIAGNOSIS — D2330 Other benign neoplasm of skin of unspecified part of face: Secondary | ICD-10-CM | POA: Diagnosis not present

## 2013-08-20 DIAGNOSIS — D239 Other benign neoplasm of skin, unspecified: Secondary | ICD-10-CM | POA: Diagnosis not present

## 2013-08-20 DIAGNOSIS — D2310 Other benign neoplasm of skin of unspecified eyelid, including canthus: Secondary | ICD-10-CM | POA: Diagnosis not present

## 2013-08-20 DIAGNOSIS — D485 Neoplasm of uncertain behavior of skin: Secondary | ICD-10-CM | POA: Diagnosis not present

## 2013-08-20 DIAGNOSIS — L819 Disorder of pigmentation, unspecified: Secondary | ICD-10-CM | POA: Diagnosis not present

## 2013-08-20 DIAGNOSIS — D1801 Hemangioma of skin and subcutaneous tissue: Secondary | ICD-10-CM | POA: Diagnosis not present

## 2013-08-20 DIAGNOSIS — L821 Other seborrheic keratosis: Secondary | ICD-10-CM | POA: Diagnosis not present

## 2013-08-25 ENCOUNTER — Encounter: Payer: Self-pay | Admitting: Internal Medicine

## 2013-11-12 DIAGNOSIS — I214 Non-ST elevation (NSTEMI) myocardial infarction: Secondary | ICD-10-CM

## 2013-11-12 HISTORY — DX: Non-ST elevation (NSTEMI) myocardial infarction: I21.4

## 2013-11-26 ENCOUNTER — Inpatient Hospital Stay (HOSPITAL_COMMUNITY)
Admission: EM | Admit: 2013-11-26 | Discharge: 2013-12-06 | DRG: 234 | Disposition: A | Discharge status: Home / Self Care | Payer: Medicare Other | Attending: Cardiothoracic Surgery | Admitting: Cardiothoracic Surgery

## 2013-11-26 ENCOUNTER — Emergency Department (HOSPITAL_COMMUNITY): Payer: Medicare Other

## 2013-11-26 ENCOUNTER — Encounter (HOSPITAL_COMMUNITY): Payer: Self-pay | Admitting: Emergency Medicine

## 2013-11-26 DIAGNOSIS — E1169 Type 2 diabetes mellitus with other specified complication: Secondary | ICD-10-CM | POA: Diagnosis present

## 2013-11-26 DIAGNOSIS — E1165 Type 2 diabetes mellitus with hyperglycemia: Secondary | ICD-10-CM

## 2013-11-26 DIAGNOSIS — I214 Non-ST elevation (NSTEMI) myocardial infarction: Secondary | ICD-10-CM | POA: Diagnosis not present

## 2013-11-26 DIAGNOSIS — I509 Heart failure, unspecified: Secondary | ICD-10-CM | POA: Diagnosis present

## 2013-11-26 DIAGNOSIS — I2510 Atherosclerotic heart disease of native coronary artery without angina pectoris: Secondary | ICD-10-CM | POA: Diagnosis present

## 2013-11-26 DIAGNOSIS — Z8249 Family history of ischemic heart disease and other diseases of the circulatory system: Secondary | ICD-10-CM | POA: Diagnosis not present

## 2013-11-26 DIAGNOSIS — R079 Chest pain, unspecified: Secondary | ICD-10-CM | POA: Diagnosis not present

## 2013-11-26 DIAGNOSIS — J90 Pleural effusion, not elsewhere classified: Secondary | ICD-10-CM | POA: Diagnosis not present

## 2013-11-26 DIAGNOSIS — N1832 Chronic kidney disease, stage 3b: Secondary | ICD-10-CM | POA: Diagnosis present

## 2013-11-26 DIAGNOSIS — Z951 Presence of aortocoronary bypass graft: Secondary | ICD-10-CM

## 2013-11-26 DIAGNOSIS — Z01810 Encounter for preprocedural cardiovascular examination: Secondary | ICD-10-CM | POA: Diagnosis not present

## 2013-11-26 DIAGNOSIS — I498 Other specified cardiac arrhythmias: Secondary | ICD-10-CM | POA: Diagnosis present

## 2013-11-26 DIAGNOSIS — E119 Type 2 diabetes mellitus without complications: Secondary | ICD-10-CM | POA: Diagnosis present

## 2013-11-26 DIAGNOSIS — R21 Rash and other nonspecific skin eruption: Secondary | ICD-10-CM | POA: Diagnosis not present

## 2013-11-26 DIAGNOSIS — IMO0001 Reserved for inherently not codable concepts without codable children: Secondary | ICD-10-CM | POA: Diagnosis present

## 2013-11-26 DIAGNOSIS — J811 Chronic pulmonary edema: Secondary | ICD-10-CM | POA: Diagnosis not present

## 2013-11-26 DIAGNOSIS — E1159 Type 2 diabetes mellitus with other circulatory complications: Secondary | ICD-10-CM | POA: Diagnosis present

## 2013-11-26 DIAGNOSIS — E785 Hyperlipidemia, unspecified: Secondary | ICD-10-CM | POA: Diagnosis present

## 2013-11-26 DIAGNOSIS — I059 Rheumatic mitral valve disease, unspecified: Secondary | ICD-10-CM | POA: Diagnosis not present

## 2013-11-26 DIAGNOSIS — Z833 Family history of diabetes mellitus: Secondary | ICD-10-CM

## 2013-11-26 DIAGNOSIS — D62 Acute posthemorrhagic anemia: Secondary | ICD-10-CM | POA: Diagnosis not present

## 2013-11-26 DIAGNOSIS — M949 Disorder of cartilage, unspecified: Secondary | ICD-10-CM

## 2013-11-26 DIAGNOSIS — I252 Old myocardial infarction: Secondary | ICD-10-CM | POA: Diagnosis present

## 2013-11-26 DIAGNOSIS — M899 Disorder of bone, unspecified: Secondary | ICD-10-CM

## 2013-11-26 DIAGNOSIS — J9819 Other pulmonary collapse: Secondary | ICD-10-CM | POA: Diagnosis not present

## 2013-11-26 DIAGNOSIS — I495 Sick sinus syndrome: Secondary | ICD-10-CM | POA: Diagnosis not present

## 2013-11-26 DIAGNOSIS — R918 Other nonspecific abnormal finding of lung field: Secondary | ICD-10-CM | POA: Diagnosis not present

## 2013-11-26 HISTORY — DX: Type 2 diabetes mellitus without complications: E11.9

## 2013-11-26 LAB — TROPONIN I: Troponin I: 19.84 ng/mL (ref <0.30)

## 2013-11-26 LAB — BASIC METABOLIC PANEL
Anion gap: 20 — ABNORMAL HIGH (ref 5–15)
BUN: 32 mg/dL — ABNORMAL HIGH (ref 6–23)
CO2: 18 mEq/L — ABNORMAL LOW (ref 19–32)
CREATININE: 0.71 mg/dL (ref 0.50–1.10)
Calcium: 10 mg/dL (ref 8.4–10.5)
Chloride: 101 mEq/L (ref 96–112)
GFR calc non Af Amer: 88 mL/min — ABNORMAL LOW (ref >90)
GLUCOSE: 111 mg/dL — AB (ref 70–99)
Potassium: 4.1 mEq/L (ref 3.7–5.3)
Sodium: 139 mEq/L (ref 137–147)

## 2013-11-26 LAB — CBG MONITORING, ED: Glucose-Capillary: 130 mg/dL — ABNORMAL HIGH (ref 70–99)

## 2013-11-26 LAB — CBC
HCT: 36.7 % (ref 36.0–46.0)
Hemoglobin: 12.5 g/dL (ref 12.0–15.0)
MCH: 29.3 pg (ref 26.0–34.0)
MCHC: 34.1 g/dL (ref 30.0–36.0)
MCV: 85.9 fL (ref 78.0–100.0)
Platelets: 287 10*3/uL (ref 150–400)
RBC: 4.27 MIL/uL (ref 3.87–5.11)
RDW: 14.8 % (ref 11.5–15.5)
WBC: 12.8 10*3/uL — ABNORMAL HIGH (ref 4.0–10.5)

## 2013-11-26 LAB — I-STAT TROPONIN, ED: Troponin i, poc: 15.09 ng/mL (ref 0.00–0.08)

## 2013-11-26 LAB — PRO B NATRIURETIC PEPTIDE: Pro B Natriuretic peptide (BNP): 9278 pg/mL — ABNORMAL HIGH (ref 0–125)

## 2013-11-26 MED ORDER — ZOLPIDEM TARTRATE 5 MG PO TABS: 5.0000 mg | ORAL_TABLET | Freq: Every evening | ORAL | Status: DC | PRN

## 2013-11-26 MED ORDER — ASPIRIN 81 MG PO CHEW: 324.0000 mg | CHEWABLE_TABLET | ORAL | Status: DC

## 2013-11-26 MED ORDER — ASPIRIN 300 MG RE SUPP: 300.0000 mg | RECTAL | Status: DC

## 2013-11-26 MED ORDER — SODIUM CHLORIDE 0.9 % IV SOLN: 250.0000 mL | INTRAVENOUS | Status: DC | PRN

## 2013-11-26 MED ORDER — HEPARIN (PORCINE) IN NACL 100-0.45 UNIT/ML-% IJ SOLN
750.0000 [IU]/h | INTRAMUSCULAR | Status: DC
  Administered 2013-11-26: 750 [IU]/h via INTRAVENOUS
  Filled 2013-11-26: qty 250

## 2013-11-26 MED ORDER — SODIUM CHLORIDE 0.9 % IV SOLN: INTRAVENOUS | Status: DC

## 2013-11-26 MED ORDER — ACETAMINOPHEN 325 MG PO TABS: 650.0000 mg | ORAL_TABLET | ORAL | Status: DC | PRN

## 2013-11-26 MED ORDER — ASPIRIN 81 MG PO CHEW
81.0000 mg | CHEWABLE_TABLET | ORAL | Status: AC
  Administered 2013-11-27: 06:00:00 81 mg via ORAL
  Filled 2013-11-26: qty 1

## 2013-11-26 MED ORDER — ASPIRIN 81 MG PO CHEW
243.0000 mg | CHEWABLE_TABLET | Freq: Once | ORAL | Status: AC
  Administered 2013-11-26: 243 mg via ORAL
  Filled 2013-11-26: qty 3

## 2013-11-26 MED ORDER — METOPROLOL TARTRATE 25 MG PO TABS
25.0000 mg | ORAL_TABLET | Freq: Two times a day (BID) | ORAL | Status: DC
  Administered 2013-11-26: 25 mg via ORAL
  Filled 2013-11-26 (×3): qty 1

## 2013-11-26 MED ORDER — INSULIN ASPART 100 UNIT/ML ~~LOC~~ SOLN
0.0000 [IU] | Freq: Three times a day (TID) | SUBCUTANEOUS | Status: DC
  Administered 2013-11-29: 2 [IU] via SUBCUTANEOUS
  Administered 2013-11-30: 1 [IU] via SUBCUTANEOUS

## 2013-11-26 MED ORDER — ONDANSETRON HCL 4 MG/2ML IJ SOLN: 4.0000 mg | Freq: Four times a day (QID) | INTRAMUSCULAR | Status: DC | PRN

## 2013-11-26 MED ORDER — ASPIRIN EC 81 MG PO TBEC
81.0000 mg | DELAYED_RELEASE_TABLET | Freq: Every day | ORAL | Status: DC
  Administered 2013-11-27 – 2013-11-30 (×4): 81 mg via ORAL
  Filled 2013-11-26 (×5): qty 1

## 2013-11-26 MED ORDER — NITROGLYCERIN 0.4 MG SL SUBL: 0.4000 mg | SUBLINGUAL_TABLET | SUBLINGUAL | Status: DC | PRN

## 2013-11-26 MED ORDER — SODIUM CHLORIDE 0.9 % IV SOLN: 1.0000 mL/kg/h | INTRAVENOUS | Status: DC

## 2013-11-26 MED ORDER — ATORVASTATIN CALCIUM 80 MG PO TABS
80.0000 mg | ORAL_TABLET | Freq: Every day | ORAL | Status: DC
  Administered 2013-11-27 – 2013-12-05 (×8): 80 mg via ORAL
  Filled 2013-11-26 (×10): qty 1

## 2013-11-26 MED ORDER — SODIUM CHLORIDE 0.9 % IJ SOLN: 3.0000 mL | Freq: Two times a day (BID) | INTRAMUSCULAR | Status: DC

## 2013-11-26 MED ORDER — ASPIRIN EC 81 MG PO TBEC: 81.0000 mg | DELAYED_RELEASE_TABLET | Freq: Every day | ORAL | Status: DC

## 2013-11-26 MED ORDER — METOPROLOL TARTRATE 1 MG/ML IV SOLN
5.0000 mg | Freq: Once | INTRAVENOUS | Status: AC
  Administered 2013-11-26: 5 mg via INTRAVENOUS
  Filled 2013-11-26: qty 5

## 2013-11-26 MED ORDER — SODIUM CHLORIDE 0.9 % IJ SOLN: 3.0000 mL | INTRAMUSCULAR | Status: DC | PRN

## 2013-11-26 MED ORDER — HEPARIN BOLUS VIA INFUSION
4000.0000 [IU] | Freq: Once | INTRAVENOUS | Status: AC
  Administered 2013-11-26: 4000 [IU] via INTRAVENOUS
  Filled 2013-11-26: qty 4000

## 2013-11-26 NOTE — ED Notes (Signed)
Abnormal labs given to Dr. Jeneen Rinks

## 2013-11-26 NOTE — ED Notes (Signed)
Reports not feeling well last night. Woke up this am feeling weak and fatigued. Today having pain to her back in between her shoulder blades and mid chest heaviness and mild neck pain. Denies any sob. HR 137 at triage, airway intact.

## 2013-11-26 NOTE — H&P (Signed)
Consulting cardiologist: Dr Carlyle Dolly MD  Clinical Summary Kathryn Logan is a 68 y.o.female hx of DM  hyperlipidemia admitted with chest pain.  Reports chest pressure started this morning while folding clothes. 6/10 pressure in midchest, no other associated symptoms. Lasted approx 1 hour. Nothing made better or worst. Never had prior episode. Denies any DOE over the last several weeks.    BNP 9278, K 4.1, Cr 0.71, Hgb 12.5, Plt 287, trop 15-->19.  CXR no acute process EKG sinus tach no specific ischemic changes. ( all prior EKGs show sinus tach 110s from 2014 on).   No Known Allergies  Medications Scheduled Medications:     Infusions: . heparin 750 Units/hr (11/26/13 1841)     PRN Medications:     Past Medical History  Diagnosis Date  . Osteopenia   . Hyperlipidemia   . Diabetes mellitus without complication     Past Surgical History  Procedure Laterality Date  . Tonsillectomy and adenoidectomy    . G 1 p 1    . No colonoscopy      SOC reviewed    Family History  Problem Relation Age of Onset  . Heart attack Mother 68  . Diabetes Mother   . Stroke Mother   . Hypertension Mother   . Goiter Mother   . Heart attack Father     >55  . Bladder Cancer Father   . Parkinson's disease Maternal Aunt   . Osteoporosis Sister   . Diabetes Brother     Social History Kathryn Logan reports that she has never smoked. She does not have any smokeless tobacco history on file. Kathryn Logan reports that she drinks alcohol.  Review of Systems CONSTITUTIONAL: No weight loss, fever, chills, weakness or fatigue.  HEENT: Eyes: No visual loss, blurred vision, double vision or yellow sclerae. No hearing loss, sneezing, congestion, runny nose or sore throat.  SKIN: No rash or itching.  CARDIOVASCULAR: per HPI RESPIRATORY: No shortness of breath, cough or sputum.  GASTROINTESTINAL: No anorexia, nausea, vomiting or diarrhea. No abdominal pain or blood.  GENITOURINARY: no  polyuria, no dysuria NEUROLOGICAL: No headache, dizziness, syncope, paralysis, ataxia, numbness or tingling in the extremities. No change in bowel or bladder control.  MUSCULOSKELETAL: No muscle, back pain, joint pain or stiffness.  HEMATOLOGIC: No anemia, bleeding or bruising.  LYMPHATICS: No enlarged nodes. No history of splenectomy.  PSYCHIATRIC: No history of depression or anxiety.      Physical Examination Blood pressure 100/66, pulse 109, temperature 98.4 F (36.9 C), temperature source Oral, resp. rate 17, height 5\' 2"  (1.575 m), weight 140 lb (63.504 kg), SpO2 97.00%. No intake or output data in the 24 hours ending 11/26/13 2013  HEENT: sclera clear  Cardiovascular: RRR, no m/r/g, no JVD, no carotid bruits  Respiratory: CTAB  GI: abdomen soft, NT, ND  MSK: no LE edema  Neuro: no focal deficits  Psych: appropriate affect   Lab Results  Basic Metabolic Panel:  Recent Labs Lab 11/26/13 1730  NA 139  K 4.1  CL 101  CO2 18*  GLUCOSE 111*  BUN 32*  CREATININE 0.71  CALCIUM 10.0    Liver Function Tests: No results found for this basename: AST, ALT, ALKPHOS, BILITOT, PROT, ALBUMIN,  in the last 168 hours  CBC:  Recent Labs Lab 11/26/13 1730  WBC 12.8*  HGB 12.5  HCT 36.7  MCV 85.9  PLT 287    Cardiac Enzymes:  Recent Labs Lab 11/26/13 1824  TROPONINI 19.84*    BNP: No components found with this basename: POCBNP,     Impression/Recommendations 1. NSTEMI - patient started on anticoag, plan for cath in AM. Currently pain free and hemodynamically stable.  - manage with ASA, beta blocker, high dose statin, ACE-I overnight. PRN SL NG and prn morphine as needed.  - cycle enzyme and EKG, echo tomorrow.   2. Chronic sinus tach Patient reports history of elevated heart rates. From EKG review typically sinus tach 110s, which is her current rate - will add TSH to AM labs  3. Hyperlipidemia - repeat fasting lipid panel in AM  4. DM - hold  oral agents while admitted, cover with SSI.   Carlyle Dolly, M.D., F.A.C.C.

## 2013-11-26 NOTE — ED Provider Notes (Signed)
Patient seen and examined. Care discussed with Dr. Silvio Clayman. I agree with his assessment and plan. EKG shows sinus tachycardia but no acute or ischemic changes. No ST changes. History of nausea and weakness last night worsening this morning chest pressure and scapular tightness starting at 3 PM. Resolved upon her my evaluation. Enzymes positive troponin greater than 15. Patient given 3 additional 81 mg aspirin after taking an 81 mg at home. Given IV beta blockers. Started on heparin bolus and infusion. Cardiology consulted. Remains symptom free and hemodynamically stable.  Care: Initiation of heparin bolus and drip, IV beta blockers, indication is non-Q-wave myocardial infarction. Patient remains symptomatically stable. Placed on initial nasal cannula O2. Not hypoxemic. No signs clinically of congestive heart failure no murmurs no gallops no JVD no edema.  Tanna Furry, MD 11/26/13 [phone]

## 2013-11-26 NOTE — Consult Note (Signed)
ANTICOAGULATION CONSULT NOTE - Initial Consult  Pharmacy Consult for Heparin Indication: chest pain/ACS  No Known Allergies  Patient Measurements: Height: 5\' 2"  (157.5 cm) Weight: 140 lb (63.504 kg) IBW/kg (Calculated) : 50.1 Heparin Dosing Weight: 63kg  Vital Signs: Temp: 98.4 F (36.9 C) (07/15 1727) Temp src: Oral (07/15 1727) BP: 113/72 mmHg (07/15 1800) Pulse Rate: 139 (07/15 1800)  Labs:  Recent Labs  11/26/13 1730  HGB 12.5  HCT 36.7  PLT 287    Estimated Creatinine Clearance: 60.6 ml/min (by C-G formula based on Cr of 0.8).  Medical History: Past Medical History  Diagnosis Date  . Osteopenia   . Hyperlipidemia   . Diabetes mellitus without complication    Medications:  No anticoagulants pta  Assessment: 67yof presents to the ED with back pain and chest heaviness. First troponin positive at 15. She will begin IV heparin. CBC wnl. BMET pending but sCr 0.8 back in February.   Goal of Therapy:  Heparin level 0.3-0.7 units/ml Monitor platelets by anticoagulation protocol: Yes   Plan:  1) Heparin bolus 4000 units x 1 2) Heparin drip at 750 units/hr 3) 6 hour heparin level 4) Daily heparin level and CBC  Kathryn Logan 11/26/2013,6:23 PM

## 2013-11-26 NOTE — ED Provider Notes (Signed)
CSN: [id number]     Arrival date & time 11/26/13  1718 History   First MD Initiated Contact with Patient 11/26/13 1757     Chief Complaint  Patient presents with  . Chest Pain  . Back Pain     (Consider location/radiation/quality/duration/timing/severity/associated sxs/prior Treatment) Patient is a 68 y.o. female presenting with chest pain.  Chest Pain Pain location:  Substernal area Pain quality: pressure   Pain radiates to:  Does not radiate Pain radiates to the back: no   Pain severity:  Severe Onset quality:  Sudden Duration:  1 day Timing:  Constant Progression:  Partially resolved Chronicity:  New Context: at rest   Relieved by:  Rest Associated symptoms: diaphoresis and nausea   Associated symptoms: no abdominal pain, no back pain, no cough, no fever, no headache, no numbness, no shortness of breath and not vomiting   Risk factors: high cholesterol and hypertension     Past Medical History  Diagnosis Date  . Osteopenia   . Hyperlipidemia   . Diabetes mellitus without complication    Past Surgical History  Procedure Laterality Date  . Tonsillectomy and adenoidectomy    . G 1 p 1    . No colonoscopy      SOC reviewed   Family History  Problem Relation Age of Onset  . Heart attack Mother 49  . Diabetes Mother   . Stroke Mother   . Hypertension Mother   . Goiter Mother   . Heart attack Father     >55  . Bladder Cancer Father   . Parkinson's disease Maternal Aunt   . Osteoporosis Sister   . Diabetes Brother    History  Substance Use Topics  . Smoking status: Never Smoker   . Smokeless tobacco: Not on file  . Alcohol Use: Yes     Comment: Occasionally    OB History   Grav Para Term Preterm Abortions TAB SAB Ect Mult Living                 Review of Systems  Constitutional: Positive for diaphoresis. Negative for fever and chills.  HENT: Negative for sore throat.   Eyes: Negative for pain.  Respiratory: Negative for cough and shortness of  breath.   Cardiovascular: Positive for chest pain.  Gastrointestinal: Positive for nausea. Negative for vomiting, abdominal pain and diarrhea.  Genitourinary: Negative for dysuria.  Musculoskeletal: Negative for back pain.  Skin: Negative for rash.  Neurological: Negative for numbness and headaches.      Allergies  Review of patient's allergies indicates no known allergies.  Home Medications   Prior to Admission medications   Medication Sig Start Date End Date Taking? Authorizing Provider  aspirin EC 81 MG tablet Take 81 mg by mouth daily.    Yes Historical Provider, MD  atorvastatin (LIPITOR) 20 MG tablet Take 1 tablet (20 mg total) by mouth daily. 08/05/13  Yes Hendricks Limes, MD  BIOTIN PO Take 2,500 mcg by mouth daily.    Yes Historical Provider, MD  glucose blood (ONE TOUCH ULTRA TEST) test strip Check blood sugar daily as directed, DX 250.02 07/24/13  Yes Hendricks Limes, MD  lanolin-mineral oil (BABY OIL) OIL Apply 1 application topically every other day.   Yes Historical Provider, MD  metFORMIN (GLUCOPHAGE) 500 MG tablet Take 500 mg by mouth 2 (two) times daily with a meal.   Yes Historical Provider, MD  [place] 1 each by Does not apply route  as directed. Check blood sugar daily as directed, DX 250.02 HOLD UNTIL PATIENT REQUEST 09/09/12  Yes Hendricks Limes, MD  pioglitazone (ACTOS) 30 MG tablet Take 1 tablet (30 mg total) by mouth daily. 08/05/13  Yes Hendricks Limes, MD   BP 101/60  Pulse 102  Temp(Src) 98.4 F (36.9 C) (Oral)  Resp 11  Ht 5\' 2"  (1.575 m)  Wt 140 lb (63.504 kg)  BMI 25.60 kg/m2  SpO2 99% Physical Exam  Constitutional: She is oriented to person, place, and time. She appears well-developed and well-nourished. No distress.  HENT:  Head: Normocephalic and atraumatic.  Eyes: Pupils are equal, round, and reactive to light. Right eye exhibits no discharge. Left eye exhibits no discharge.  Neck: Normal range of motion.   Cardiovascular: Normal rate, regular rhythm and normal heart sounds.   Pulmonary/Chest: Effort normal and breath sounds normal.  Abdominal: Soft. She exhibits no distension. There is no tenderness.  Musculoskeletal: Normal range of motion.  Neurological: She is alert and oriented to person, place, and time.  Skin: Skin is warm. She is not diaphoretic.    ED Course  Procedures (including critical care time) Labs Review Labs Reviewed  CBC - Abnormal; Notable for the following:    WBC 12.8 (*)    All other components within normal limits  BASIC METABOLIC PANEL - Abnormal; Notable for the following:    CO2 18 (*)    Glucose, Bld 111 (*)    BUN 32 (*)    GFR calc non Af Amer 88 (*)    Anion gap 20 (*)    All other components within normal limits  PRO B NATRIURETIC PEPTIDE - Abnormal; Notable for the following:    Pro B Natriuretic peptide (BNP) 9278.0 (*)    All other components within normal limits  TROPONIN I - Abnormal; Notable for the following:    Troponin I 19.84 (*)    All other components within normal limits  I-STAT TROPOININ, ED - Abnormal; Notable for the following:    Troponin i, poc 15.09 (*)    All other components within normal limits  CBG MONITORING, ED - Abnormal; Notable for the following:    Glucose-Capillary 130 (*)    All other components within normal limits  HEPARIN LEVEL (UNFRACTIONATED)  CBC    Imaging Review Dg Chest Port 1 View  11/26/2013   CLINICAL DATA:  Chest pain, back pain  EXAM: PORTABLE CHEST - 1 VIEW  COMPARISON:  Portable exam 1852 hr without priors for comparison.  FINDINGS: Minimal enlargement of cardiac silhouette.  Pulmonary vascularity and mediastinal contours normal.  Minimal bibasilar atelectasis.  Slight accentuation of perihilar markings without segmental consolidation, pleural effusion or pneumothorax.  Bones unremarkable.  IMPRESSION: Minimal bibasilar atelectasis enlargement of cardiac silhouette.   Electronically Signed   By: Lavonia Dana M.D.   On: 11/26/2013 19:35     EKG Interpretation   Date/Time:  Wednesday November 26 2013 17:23:19 EDT Ventricular Rate:  137 PR Interval:  112 QRS Duration: 82 QT Interval:  276 QTC Calculation: 416 R Axis:   77 Text Interpretation:  Sinus tachycardia Nonspecific T wave abnormality  Abnormal ECG No previous ECGs available Confirmed by Jeneen Rinks  MD, [place]  [phone]) on 11/26/2013 6:02:38 PM      MDM   Final diagnoses:  NSTEMI (non-ST elevated myocardial infarction)   68 year old female with a history of hyperlipidemia and diabetes who presents with chest pain. This has been started earlier this morning.  Patient did have chest pain like pressure with associated nausea and diaphoresis.  Patient is hemodynamically stable upon arrival. Patient is tachycardic with a heart rate in the 130s. Of note the patient does have a resting heart rate is documented at prior PCP this is being in the 100s to 110s. Patient's EKG demonstrates sinus tachycardia with nonspecific T wave abnormality. Given the patient's duration of her pain and her age and risk factors, there is concern for ACS. Doubt pneumonia, esophagitis, dissection, aneurysm. Will obtain troponins, basic labs, chest x-ray.  Chest x-ray demonstrates minimal bibasilar atelectasis with enlargement of cardiac silhouette. Patient's troponin is significantly elevated. I consulted with cardiology for concern for NSTEMI. Cardiology agrees to admit the patient. Patient given aspirin, nitroglycerin as well as the patient is having no active chest pain. Have been ordered per pharmacy consult. Patient started on heparin. Per the patient's ED stay, she continued to have no chest pain. Patient admitted to the floor with cardiology in stable condition. Plan according to cardiology is have the patient have a heart cath done in the morning. Patient was seen and evaluated by myself and by the attending Dr. Jeneen Rinks.     Freddi Che, MD 11/26/13 2238

## 2013-11-26 NOTE — ED Notes (Signed)
Dr. James at bedside  

## 2013-11-27 ENCOUNTER — Encounter (HOSPITAL_COMMUNITY)
Admission: EM | Disposition: A | Discharge status: Home / Self Care | Payer: Self-pay | Source: Home / Self Care | Attending: Cardiothoracic Surgery

## 2013-11-27 DIAGNOSIS — I495 Sick sinus syndrome: Secondary | ICD-10-CM

## 2013-11-27 DIAGNOSIS — I2510 Atherosclerotic heart disease of native coronary artery without angina pectoris: Secondary | ICD-10-CM

## 2013-11-27 DIAGNOSIS — E785 Hyperlipidemia, unspecified: Secondary | ICD-10-CM

## 2013-11-27 DIAGNOSIS — IMO0001 Reserved for inherently not codable concepts without codable children: Secondary | ICD-10-CM

## 2013-11-27 DIAGNOSIS — E1165 Type 2 diabetes mellitus with hyperglycemia: Secondary | ICD-10-CM

## 2013-11-27 HISTORY — PX: LEFT HEART CATHETERIZATION WITH CORONARY ANGIOGRAM: SHX5451

## 2013-11-27 LAB — GLUCOSE, CAPILLARY
GLUCOSE-CAPILLARY: 82 mg/dL (ref 70–99)
Glucose-Capillary: 111 mg/dL — ABNORMAL HIGH (ref 70–99)
Glucose-Capillary: 78 mg/dL (ref 70–99)
Glucose-Capillary: 105 mg/dL — ABNORMAL HIGH (ref 70–99)

## 2013-11-27 LAB — CBC
HEMATOCRIT: 35.1 % — AB (ref 36.0–46.0)
HEMOGLOBIN: 12 g/dL (ref 12.0–15.0)
MCH: 29.8 pg (ref 26.0–34.0)
MCHC: 34.2 g/dL (ref 30.0–36.0)
MCV: 87.1 fL (ref 78.0–100.0)
Platelets: 286 10*3/uL (ref 150–400)
RBC: 4.03 MIL/uL (ref 3.87–5.11)
RDW: 14.9 % (ref 11.5–15.5)
WBC: 12.6 10*3/uL — AB (ref 4.0–10.5)

## 2013-11-27 LAB — HEPARIN LEVEL (UNFRACTIONATED): Heparin Unfractionated: 0.49 IU/mL (ref 0.30–0.70)

## 2013-11-27 LAB — TROPONIN I
TROPONIN I: 18.77 ng/mL — AB (ref <0.30)
TROPONIN I: 15.67 ng/mL — AB (ref <0.30)
TROPONIN I: 12.8 ng/mL — AB (ref <0.30)

## 2013-11-27 LAB — BASIC METABOLIC PANEL
Anion gap: 18 — ABNORMAL HIGH (ref 5–15)
BUN: 27 mg/dL — AB (ref 6–23)
CHLORIDE: 101 meq/L (ref 96–112)
CO2: 21 mEq/L (ref 19–32)
Calcium: 9.3 mg/dL (ref 8.4–10.5)
Creatinine, Ser: 0.67 mg/dL (ref 0.50–1.10)
GFR calc Af Amer: >90 mL/min (ref >90)
GFR calc non Af Amer: 90 mL/min — ABNORMAL LOW (ref >90)
Glucose, Bld: 121 mg/dL — ABNORMAL HIGH (ref 70–99)
Potassium: 3.9 mEq/L (ref 3.7–5.3)
Sodium: 140 mEq/L (ref 137–147)

## 2013-11-27 LAB — MAGNESIUM: MAGNESIUM: 1.7 mg/dL (ref 1.5–2.5)

## 2013-11-27 LAB — LIPID PANEL
CHOLESTEROL: 138 mg/dL (ref 0–200)
HDL: 54 mg/dL (ref >39)
LDL Cholesterol: 69 mg/dL (ref 0–99)
TRIGLYCERIDES: 77 mg/dL (ref <150)
Total CHOL/HDL Ratio: 2.6 RATIO
VLDL: 15 mg/dL (ref 0–40)

## 2013-11-27 LAB — HEMOGLOBIN A1C
Hgb A1c MFr Bld: 6.2 % — ABNORMAL HIGH (ref <5.7)
Mean Plasma Glucose: 131 mg/dL — ABNORMAL HIGH (ref <117)

## 2013-11-27 LAB — APTT: aPTT: 73 seconds — ABNORMAL HIGH (ref 24–37)

## 2013-11-27 LAB — TSH: TSH: 3.47 u[IU]/mL (ref 0.350–4.500)

## 2013-11-27 LAB — PROTIME-INR
INR: 1.08 (ref 0.00–1.49)
Prothrombin Time: 14 seconds (ref 11.6–15.2)

## 2013-11-27 LAB — T4, FREE: Free T4: 1.09 ng/dL (ref 0.80–1.80)

## 2013-11-27 LAB — MRSA PCR SCREENING: MRSA by PCR: NEGATIVE

## 2013-11-27 SURGERY — LEFT HEART CATHETERIZATION WITH CORONARY ANGIOGRAM
Anesthesia: LOCAL

## 2013-11-27 MED ORDER — NITROGLYCERIN 1 MG/10 ML FOR IR/CATH LAB
INTRA_ARTERIAL | Status: AC
  Filled 2013-11-27: qty 10

## 2013-11-27 MED ORDER — FENTANYL CITRATE 0.05 MG/ML IJ SOLN
INTRAMUSCULAR | Status: AC
  Filled 2013-11-27: qty 2

## 2013-11-27 MED ORDER — MIDAZOLAM HCL 2 MG/2ML IJ SOLN
INTRAMUSCULAR | Status: AC
  Filled 2013-11-27: qty 2

## 2013-11-27 MED ORDER — LIDOCAINE HCL (PF) 1 % IJ SOLN
INTRAMUSCULAR | Status: AC
  Filled 2013-11-27: qty 30

## 2013-11-27 MED ORDER — HEPARIN (PORCINE) IN NACL 2-0.9 UNIT/ML-% IJ SOLN
INTRAMUSCULAR | Status: AC
  Filled 2013-11-27: qty 1000

## 2013-11-27 MED ORDER — CARVEDILOL 3.125 MG PO TABS
3.1250 mg | ORAL_TABLET | Freq: Two times a day (BID) | ORAL | Status: DC
Start: 2013-11-27 — End: 2013-11-30
  Administered 2013-11-27 – 2013-11-30 (×4): 3.125 mg via ORAL
  Filled 2013-11-27 (×9): qty 1

## 2013-11-27 MED ORDER — ACETAMINOPHEN 325 MG PO TABS: 650.0000 mg | ORAL_TABLET | ORAL | Status: DC | PRN

## 2013-11-27 MED ORDER — HEPARIN (PORCINE) IN NACL 100-0.45 UNIT/ML-% IJ SOLN
750.0000 [IU]/h | INTRAMUSCULAR | Status: DC
  Filled 2013-11-27: qty 250

## 2013-11-27 MED ORDER — VERAPAMIL HCL 2.5 MG/ML IV SOLN
INTRAVENOUS | Status: AC
  Filled 2013-11-27: qty 2

## 2013-11-27 MED ORDER — HEPARIN (PORCINE) IN NACL 100-0.45 UNIT/ML-% IJ SOLN
750.0000 [IU]/h | INTRAMUSCULAR | Status: DC
  Administered 2013-11-28: 750 [IU]/h via INTRAVENOUS
  Filled 2013-11-27: qty 250

## 2013-11-27 MED ORDER — HEPARIN SODIUM (PORCINE) 1000 UNIT/ML IJ SOLN
INTRAMUSCULAR | Status: AC
  Filled 2013-11-27: qty 1

## 2013-11-27 MED ORDER — ISOSORBIDE MONONITRATE 15 MG HALF TABLET
15.0000 mg | ORAL_TABLET | Freq: Every day | ORAL | Status: DC
Start: 2013-11-27 — End: 2013-11-30
  Administered 2013-11-29 – 2013-11-30 (×2): 15 mg via ORAL
  Filled 2013-11-27 (×4): qty 1

## 2013-11-27 MED ORDER — SODIUM CHLORIDE 0.9 % IV SOLN
INTRAVENOUS | Status: AC
Start: 2013-11-27 — End: 2013-11-28
  Administered 2013-11-27 (×2): via INTRAVENOUS

## 2013-11-27 MED ORDER — ONDANSETRON HCL 4 MG/2ML IJ SOLN: 4.0000 mg | Freq: Four times a day (QID) | INTRAMUSCULAR | Status: DC | PRN

## 2013-11-27 NOTE — Consult Note (Addendum)
ANTICOAGULATION CONSULT NOTE  Pharmacy Consult for Heparin Indication: chest pain/ACS  No Known Allergies  Patient Measurements: Height: 5\' 2"  (157.5 cm) Weight: 142 lb 6.7 oz (64.6 kg) IBW/kg (Calculated) : 50.1 Heparin Dosing Weight: 63kg  Vital Signs: Temp: 98.3 F (36.8 C) (07/16 1138) Temp src: Oral (07/16 1138) BP: 86/46 mmHg (07/16 1138) Pulse Rate: 100 (07/16 1606)  Labs:  Recent Labs  11/26/13 1730  11/26/13 2322 11/27/13 0057 11/27/13 0640 11/27/13 1034  HGB 12.5  --   --  12.0  --   --   HCT 36.7  --   --  35.1*  --   --   PLT 287  --   --  286  --   --   APTT  --   --   --  73*  --   --   LABPROT  --   --   --  14.0  --   --   INR  --   --   --  1.08  --   --   HEPARINUNFRC  --   --   --  0.49  --   --   CREATININE 0.71  --   --  0.67  --   --   TROPONINI  --   < > 18.77*  --  15.67* 12.80*  < > = values in this interval not displayed.  Estimated Creatinine Clearance: 61 ml/min (by C-G formula based on Cr of 0.67).  Medical History: Past Medical History  Diagnosis Date  . Osteopenia   . Hyperlipidemia   . Diabetes mellitus without complication    Medications:  No anticoagulants pta  Assessment: Kathryn Logan presents to resume heparin after PCI.  TR band deflated at ~1815 per nurse.  Will resume heparin without bolus 8 hours post TR removal.  Previously therapeutic on 750 unit/hr.    Goal of Therapy:  Heparin level 0.3-0.7 units/ml Monitor platelets by anticoagulation protocol: Yes   Plan:  1) Resume Heparin drip at 750 units/hr 8 hr after sheath removal 2) 8 hour heparin level after resume 3) Daily heparin level and CBC  Thank you, Vivia Ewing, PharmD Clinical Pharmacist - Resident Pager: [phone] Pharmacy: [phone] 11/27/2013 5:52 PM

## 2013-11-27 NOTE — Care Management Note (Addendum)
    Page 1 of 1   12/04/2013     11:05:59 AM CARE MANAGEMENT NOTE 12/04/2013  Patient:  Kathryn Logan, Kathryn Logan   Account Number:  [id number]  Date Initiated:  11/27/2013  Documentation initiated by:  [facility]  Subjective/Objective Assessment:   68 y.o.female hx of DM  hyperlipidemia admitted with chest pain.//Home with spouse.     Action/Plan:   Hep gtts; heart cath.//Access for [facility] services.   Anticipated DC Date:     Anticipated DC Plan:  HOME/SELF CARE      DC Planning Services  CM consult      Choice offered to / List presented to:             Status of service:  Completed, signed off Medicare Important Message given?  YES (If response is "NO", the following Medicare IM given date fields will be blank) Date Medicare IM given:  12/02/2013 Medicare IM given by:  [facility] Date Additional Medicare IM given:  12/04/2013 Additional Medicare IM given by:  [name]  Discharge Disposition:    Per UR Regulation:  Reviewed for med. necessity/level of care/duration of stay  If discussed at [place] of Stay Meetings, dates discussed:   12/02/2013    Comments:  ContactMANUELLA, Logan   [phone]   Kathryn Logan Spouse [phone]   [phone]  12-02-13 3:40pm Kathryn Logan Sitting up in chair eating lunch.  Independent prior to admission - Lives with husband - plans to go to sons house to live on discharge.  Confirmed will have someone with her 24/7 on discharge.  CM will continue to follow.  12-01-13 9:15pm Kathryn Logan [phone] Post op CABG x3 today

## 2013-11-27 NOTE — Progress Notes (Signed)
Patient Name: Kathryn Logan Date of Encounter: 11/27/2013     Principal Problem:   NSTEMI (non-ST elevated myocardial infarction) Active Problems:   HYPERLIPIDEMIA NEC/NOS   Type II or unspecified type diabetes mellitus without mention of complication, uncontrolled    SUBJECTIVE  Feeling well, denies any chest discomfort since arrival. No SOB. Per family, her symptom actually started on the night of 7/14 with nausea and back pain, however she did not recognize it at the time. Her CP started around 3-4PM on 7/15.   CURRENT MEDS . aspirin EC  81 mg Oral Daily  . atorvastatin  80 mg Oral Daily  . insulin aspart  0-9 Units Subcutaneous TID WC  . metoprolol tartrate  25 mg Oral BID  . sodium chloride  3 mL Intravenous Q12H    OBJECTIVE  Filed Vitals:   11/26/13 2238 11/27/13 0009 11/27/13 0500 11/27/13 0547  BP: 107/69 101/60  94/55  Pulse: 107 107  100  Temp: 97.8 F (36.6 C) 98.5 F (36.9 C)  98.4 F (36.9 C)  TempSrc: Oral Oral  Oral  Resp: 20 18  20   Height: 5\' 2"  (1.575 m)     Weight: 142 lb 6.7 oz (64.6 kg) 142 lb 6.7 oz (64.6 kg) 142 lb 6.7 oz (64.6 kg)   SpO2: 99% 96%  97%    Intake/Output Summary (Last 24 hours) at 11/27/13 0746 Last data filed at 11/27/13 6269  Gross per 24 hour  Intake 254.38 ml  Output    400 ml  Net -145.62 ml   Filed Weights   11/26/13 2238 11/27/13 0009 11/27/13 0500  Weight: 142 lb 6.7 oz (64.6 kg) 142 lb 6.7 oz (64.6 kg) 142 lb 6.7 oz (64.6 kg)    PHYSICAL EXAM  General: Pleasant, NAD. Neuro: Alert and oriented X 3. Moves all extremities spontaneously. Psych: Normal affect. HEENT:  Normal  Neck: Supple without bruits or JVD. Lungs:  Resp regular and unlabored, decreased breath sound bilaterally Heart: RRR no s3, s4, or murmurs. Abdomen: Soft, non-tender, non-distended, BS + x 4.  Extremities: No clubbing, cyanosis or edema. DP/PT/Radials 2+ and equal bilaterally.  Accessory Clinical Findings  CBC  Recent Labs  11/26/13 1730 11/27/13 0057  WBC 12.8* 12.6*  HGB 12.5 12.0  HCT 36.7 35.1*  MCV 85.9 87.1  PLT 287 485   Basic Metabolic Panel  Recent Labs  11/26/13 1730 11/27/13 0057  NA 139 140  K 4.1 3.9  CL 101 101  CO2 18* 21  GLUCOSE 111* 121*  BUN 32* 27*  CREATININE 0.71 0.67  CALCIUM 10.0 9.3  MG  --  1.7   Cardiac Enzymes  Recent Labs  11/26/13 1824 11/26/13 2322  TROPONINI 19.84* 18.77*   Hemoglobin A1C  Recent Labs  11/27/13 0057  HGBA1C 6.2*   Fasting Lipid Panel  Recent Labs  11/27/13 0057  CHOL 138  HDL 54  LDLCALC 69  TRIG 77  CHOLHDL 2.6   Thyroid Function Tests  Recent Labs  11/26/13 2322  TSH 3.470    TELE  Sinus tach with HR 90-100s  ECG  Sinus tach with HR >100, poor R wave progression in anterior lead  Radiology/Studies  Dg Chest Port 1 View  11/26/2013   CLINICAL DATA:  Chest pain, back pain  EXAM: PORTABLE CHEST - 1 VIEW  COMPARISON:  Portable exam 1852 hr without priors for comparison.  FINDINGS: Minimal enlargement of cardiac silhouette.  Pulmonary vascularity and mediastinal contours normal.  Minimal  bibasilar atelectasis.  Slight accentuation of perihilar markings without segmental consolidation, pleural effusion or pneumothorax.  Bones unremarkable.  IMPRESSION: Minimal bibasilar atelectasis enlargement of cardiac silhouette.   Electronically Signed   By: Lavonia Dana M.D.   On: 11/26/2013 19:35    ASSESSMENT AND PLAN  1. NSTEMI   - chest pain free since ED arrival  - continue with ASA, beta blocker, high dose statin overnight.   - troponin 19.84 --> 18.77, trending down  - plan for cath today. Risk and benefit has been explained to the patient and her family, she display clear understanding and agree to proceed  - no contraindication to DES (denies bleeding, h/o stroke, upcoming surgery or on systemic anticoagulation)  2. Chronic sinus tach - unclear etiology  - TSH and free T4 normal  - Patient reports history of  elevated heart rates. From EKG review typically sinus tach 110s, which is her current rate   - continue 25mg  metoprolol XL with borderline SBP  3. Elevated: proBNP >9000  - no acute CHF symptom  - may be related to tachycardia  - plan for Echo today  4. Hyperlipidemia   - repeat fasting lipid panel  5. DM   - hold oral agents while admitted, cover with SSI.    Hilbert Corrigan PA-C Pager: [phone]   Patient seen and examined. Agree with assessment and plan. Very pleasant 68 yo WF who presented yesterday after experiencing chest tightness. She did not feel well the evening before and was very uncomfortable trying to sleep. ECG suggests LAD ischemia/infarction and troponins are positive c/w NSTEMI. BNP is elevated c/w probable ischemia mediated CHF.  No chest pain presently on iv heparin. Discussed cath and possible PCI in detail with pt and family. Will change metoprolol XL to carvedilol with DM and CHF and titrate as BP and HR allow. Will add nitrates as BP allows.  Troy Sine, MD, [facility] 11/27/2013 8:31 AM

## 2013-11-27 NOTE — CV Procedure (Addendum)
     Left Heart Catheterization with Coronary Angiography  Report  Kathryn Logan  68 y.o.  female 08-03-1945  Procedure Date: 11/27/2013 Referring Physician: Carlyle Dolly, M.D. Primary Cardiologist: Carlyle Dolly, M.D.  INDICATIONS:  Non-ST elevation MI  PROCEDURE:    1. Left heart catheterization; 2. Left ventriculography; 3. Coronary angiography  CONSENT:  The risks, benefits, and details of the procedure were explained in detail to the patient. Risks including death, stroke, heart attack, kidney injury, allergy, limb ischemia, bleeding and radiation injury were discussed.  The patient verbalized understanding and wanted to proceed.  Informed written consent was obtained.  PROCEDURE TECHNIQUE:  After Xylocaine anesthesia a 5 French Slender sheath was placed in the right radial artery with an angiocath and the modified Seldinger technique.  Coronary angiography was done using a 5 F JR 4 and JL 3.5 cm diagnostic catheter.  Left ventriculography was done using the JR 4 catheter and hand injection.   Images were reviewed and discussion with the patient concerning surgery versus PCI. Considering left ventricular systolic dysfunction, proximal LAD involvement, and other focal but present disease in the right and circumflex coronary artery, we decided to proceed with evaluation for surgery. The case was terminated.  A wrist band was used for hemostasis with good results.   CONTRAST:  Total of 100 cc.  COMPLICATIONS:  none   HEMODYNAMICS:  Aortic pressure 91/51 mmHg; LV pressure 94/13 mmHg; LVEDP 16 mmHg  ANGIOGRAPHIC DATA:   The left main coronary artery is widely patent.  The left anterior descending artery is large and trans-apical with a proximal eccentric 99% stenosis. This is the culprit vessel for the patient's presentation and wall motion abnormality  The left circumflex artery is dominant. The PDA arises from the circumflex. The first obtuse marginal contains proximal  75-80% obstruction. The vessel distribution is relatively small. The second obtuse marginal is large in caliber and distribution. There is proximal to ostial 95% stenosis. The third, fourth, and fifth obtuse marginals are free of any significant obstruction. There is 50% ostial PDA. The PDA is small.  The right coronary artery is nondominant. There is ostial 70% narrowing with catheter damping. The mid vessel contains segmental 95% stenosis proximal to the dominant acute marginal branch. The acute marginal branch is graftable.  LEFT VENTRICULOGRAM:  Left ventricular angiogram was done in the 30 RAO projection and revealed mid to apical anterior severe hypokinesis. Inferior apical severe hypokinesis. Estimated ejection fraction 35%.   IMPRESSIONS:  1. Severe coronary artery disease involving the proximal LAD, the first and second marginal branches, and a nondominant right coronary artery.  2. Moderate to severe left ventricular systolic dysfunction in a predominant apical distribution compatible with LAD occlusion. EF is 30-35%.   RECOMMENDATION:  Given LV dysfunction, large LAD distribution, and diabetes, CABG in this highly functional 68 year old female would appear to be her best long-term option. PCI could be performed without much trouble but is probably less sturdy.  [facility] has been notified. They request Dr. Servando Snare but prefer expedient treatment if they have to wait.

## 2013-11-27 NOTE — H&P (View-Only) (Signed)
Patient Name: Kathryn Logan Date of Encounter: 11/27/2013     Principal Problem:   NSTEMI (non-ST elevated myocardial infarction) Active Problems:   HYPERLIPIDEMIA NEC/NOS   Type II or unspecified type diabetes mellitus without mention of complication, uncontrolled    SUBJECTIVE  Feeling well, denies any chest discomfort since arrival. No SOB. Per family, her symptom actually started on the night of 7/14 with nausea and back pain, however she did not recognize it at the time. Her CP started around 3-4PM on 7/15.   CURRENT MEDS . aspirin EC  81 mg Oral Daily  . atorvastatin  80 mg Oral Daily  . insulin aspart  0-9 Units Subcutaneous TID WC  . metoprolol tartrate  25 mg Oral BID  . sodium chloride  3 mL Intravenous Q12H    OBJECTIVE  Filed Vitals:   11/26/13 2238 11/27/13 0009 11/27/13 0500 11/27/13 0547  BP: 107/69 101/60  94/55  Pulse: 107 107  100  Temp: 97.8 F (36.6 C) 98.5 F (36.9 C)  98.4 F (36.9 C)  TempSrc: Oral Oral  Oral  Resp: 20 18  20   Height: 5\' 2"  (1.575 m)     Weight: 142 lb 6.7 oz (64.6 kg) 142 lb 6.7 oz (64.6 kg) 142 lb 6.7 oz (64.6 kg)   SpO2: 99% 96%  97%    Intake/Output Summary (Last 24 hours) at 11/27/13 0746 Last data filed at 11/27/13 9381  Gross per 24 hour  Intake 254.38 ml  Output    400 ml  Net -145.62 ml   Filed Weights   11/26/13 2238 11/27/13 0009 11/27/13 0500  Weight: 142 lb 6.7 oz (64.6 kg) 142 lb 6.7 oz (64.6 kg) 142 lb 6.7 oz (64.6 kg)    PHYSICAL EXAM  General: Pleasant, NAD. Neuro: Alert and oriented X 3. Moves all extremities spontaneously. Psych: Normal affect. HEENT:  Normal  Neck: Supple without bruits or JVD. Lungs:  Resp regular and unlabored, decreased breath sound bilaterally Heart: RRR no s3, s4, or murmurs. Abdomen: Soft, non-tender, non-distended, BS + x 4.  Extremities: No clubbing, cyanosis or edema. DP/PT/Radials 2+ and equal bilaterally.  Accessory Clinical Findings  CBC  Recent Labs  11/26/13 1730 11/27/13 0057  WBC 12.8* 12.6*  HGB 12.5 12.0  HCT 36.7 35.1*  MCV 85.9 87.1  PLT 287 017   Basic Metabolic Panel  Recent Labs  11/26/13 1730 11/27/13 0057  NA 139 140  K 4.1 3.9  CL 101 101  CO2 18* 21  GLUCOSE 111* 121*  BUN 32* 27*  CREATININE 0.71 0.67  CALCIUM 10.0 9.3  MG  --  1.7   Cardiac Enzymes  Recent Labs  11/26/13 1824 11/26/13 2322  TROPONINI 19.84* 18.77*   Hemoglobin A1C  Recent Labs  11/27/13 0057  HGBA1C 6.2*   Fasting Lipid Panel  Recent Labs  11/27/13 0057  CHOL 138  HDL 54  LDLCALC 69  TRIG 77  CHOLHDL 2.6   Thyroid Function Tests  Recent Labs  11/26/13 2322  TSH 3.470    TELE  Sinus tach with HR 90-100s  ECG  Sinus tach with HR >100, poor R wave progression in anterior lead  Radiology/Studies  Dg Chest Port 1 View  11/26/2013   CLINICAL DATA:  Chest pain, back pain  EXAM: PORTABLE CHEST - 1 VIEW  COMPARISON:  Portable exam 1852 hr without priors for comparison.  FINDINGS: Minimal enlargement of cardiac silhouette.  Pulmonary vascularity and mediastinal contours normal.  Minimal  bibasilar atelectasis.  Slight accentuation of perihilar markings without segmental consolidation, pleural effusion or pneumothorax.  Bones unremarkable.  IMPRESSION: Minimal bibasilar atelectasis enlargement of cardiac silhouette.   Electronically Signed   By: Lavonia Dana M.D.   On: 11/26/2013 19:35    ASSESSMENT AND PLAN  1. NSTEMI   - chest pain free since ED arrival  - continue with ASA, beta blocker, high dose statin overnight.   - troponin 19.84 --> 18.77, trending down  - plan for cath today. Risk and benefit has been explained to the patient and her family, she display clear understanding and agree to proceed  - no contraindication to DES (denies bleeding, h/o stroke, upcoming surgery or on systemic anticoagulation)  2. Chronic sinus tach - unclear etiology  - TSH and free T4 normal  - Patient reports history of  elevated heart rates. From EKG review typically sinus tach 110s, which is her current rate   - continue 25mg  metoprolol XL with borderline SBP  3. Elevated: proBNP >9000  - no acute CHF symptom  - may be related to tachycardia  - plan for Echo today  4. Hyperlipidemia   - repeat fasting lipid panel  5. DM   - hold oral agents while admitted, cover with SSI.    Hilbert Corrigan PA-C Pager: [phone]   Patient seen and examined. Agree with assessment and plan. Very pleasant 68 yo WF who presented yesterday after experiencing chest tightness. She did not feel well the evening before and was very uncomfortable trying to sleep. ECG suggests LAD ischemia/infarction and troponins are positive c/w NSTEMI. BNP is elevated c/w probable ischemia mediated CHF.  No chest pain presently on iv heparin. Discussed cath and possible PCI in detail with pt and family. Will change metoprolol XL to carvedilol with DM and CHF and titrate as BP and HR allow. Will add nitrates as BP allows.  Troy Sine, MD, [facility] 11/27/2013 8:31 AM

## 2013-11-27 NOTE — Progress Notes (Signed)
TR BAND REMOVAL  LOCATION:    right radial  DEFLATED PER PROTOCOL:    Yes.    TIME BAND OFF / DRESSING APPLIED:    1944   SITE UPON ARRIVAL:    Level 0  SITE AFTER BAND REMOVAL:    Level 0  REVERSE ALLEN'S TEST:     positive  CIRCULATION SENSATION AND MOVEMENT:    Within Normal Limits   Yes.    COMMENTS:   Written post radial cath instructions given and reviewed w/ pt and family.  Husband quite anxious about pending surgery, numerous questions answered, much calmer after 30 minute discussion.  Pt denies complaints. Amb to bathroom and back without difficulty. Tele shows HR 120, Coreg and Imdur held earlier today due to SBP<100.  BP 114/67. Coreg given at this time.

## 2013-11-27 NOTE — Consult Note (Signed)
[place] for Heparin Indication: chest pain/ACS  No Known Allergies  Patient Measurements: Height: 5\' 2"  (157.5 cm) Weight: 142 lb 6.7 oz (64.6 kg) IBW/kg (Calculated) : 50.1 Heparin Dosing Weight: 63kg  Vital Signs: Temp: 98.5 F (36.9 C) (07/16 0009) Temp src: Oral (07/16 0009) BP: 101/60 mmHg (07/16 0009) Pulse Rate: 107 (07/16 0009)  Labs:  Recent Labs  11/26/13 1730 11/26/13 1824 11/26/13 2322 11/27/13 0057  HGB 12.5  --   --  12.0  HCT 36.7  --   --  35.1*  PLT 287  --   --  286  APTT  --   --   --  73*  LABPROT  --   --   --  14.0  INR  --   --   --  1.08  HEPARINUNFRC  --   --   --  0.49  CREATININE 0.71  --   --  0.67  TROPONINI  --  19.84* 18.77*  --     Estimated Creatinine Clearance: 61 ml/min (by C-G formula based on Cr of 0.67).  Assessment: 68 y.o. female with chest pain for heparin   Goal of Therapy:  Heparin level 0.3-0.7 units/ml Monitor platelets by anticoagulation protocol: Yes   Plan:  Continue Heparin at current rate  Pelagia Iacobucci, Bronson Curb 11/27/2013,2:29 AM

## 2013-11-27 NOTE — Interval H&P Note (Signed)
Cath Lab Visit (complete for each Cath Lab visit)  Clinical Evaluation Leading to the Procedure:   ACS: Yes.    Non-ACS:    Anginal Classification: CCS IV  Anti-ischemic medical therapy: Maximal Therapy (2 or more classes of medications)  Non-Invasive Test Results: No non-invasive testing performed  Prior CABG: No previous CABG      History and Physical Interval Note:  11/27/2013 4:22 PM  Kathryn Logan  has presented today for surgery, with the diagnosis of chest pain  The various methods of treatment have been discussed with the patient and family. After consideration of risks, benefits and other options for treatment, the patient has consented to  Procedure(s): LEFT HEART CATHETERIZATION WITH CORONARY ANGIOGRAM (N/A) as a surgical intervention .  The patient's history has been reviewed, patient examined, no change in status, stable for surgery.  I have reviewed the patient's chart and labs.  Questions were answered to the patient's satisfaction.     Sinclair Grooms

## 2013-11-28 ENCOUNTER — Other Ambulatory Visit: Payer: Self-pay | Admitting: *Deleted

## 2013-11-28 ENCOUNTER — Inpatient Hospital Stay (HOSPITAL_COMMUNITY): Payer: Medicare Other

## 2013-11-28 DIAGNOSIS — Z01810 Encounter for preprocedural cardiovascular examination: Secondary | ICD-10-CM

## 2013-11-28 DIAGNOSIS — I2510 Atherosclerotic heart disease of native coronary artery without angina pectoris: Secondary | ICD-10-CM

## 2013-11-28 DIAGNOSIS — I059 Rheumatic mitral valve disease, unspecified: Secondary | ICD-10-CM

## 2013-11-28 LAB — BASIC METABOLIC PANEL
Anion gap: 15 (ref 5–15)
BUN: 17 mg/dL (ref 6–23)
CO2: 20 mEq/L (ref 19–32)
Calcium: 8.9 mg/dL (ref 8.4–10.5)
Chloride: 105 mEq/L (ref 96–112)
Creatinine, Ser: 0.61 mg/dL (ref 0.50–1.10)
GFR calc Af Amer: >90 mL/min (ref >90)
GFR calc non Af Amer: >90 mL/min (ref >90)
Glucose, Bld: 148 mg/dL — ABNORMAL HIGH (ref 70–99)
Potassium: 4.2 mEq/L (ref 3.7–5.3)
Sodium: 140 mEq/L (ref 137–147)

## 2013-11-28 LAB — GLUCOSE, CAPILLARY
GLUCOSE-CAPILLARY: 115 mg/dL — AB (ref 70–99)
Glucose-Capillary: 114 mg/dL — ABNORMAL HIGH (ref 70–99)
Glucose-Capillary: 108 mg/dL — ABNORMAL HIGH (ref 70–99)

## 2013-11-28 LAB — CBC
HEMATOCRIT: 30.2 % — AB (ref 36.0–46.0)
Hemoglobin: 9.9 g/dL — ABNORMAL LOW (ref 12.0–15.0)
MCH: 28.8 pg (ref 26.0–34.0)
MCHC: 32.8 g/dL (ref 30.0–36.0)
MCV: 87.8 fL (ref 78.0–100.0)
PLATELETS: 214 10*3/uL (ref 150–400)
RBC: 3.44 MIL/uL — ABNORMAL LOW (ref 3.87–5.11)
RDW: 15.2 % (ref 11.5–15.5)
WBC: 8.7 10*3/uL (ref 4.0–10.5)

## 2013-11-28 LAB — HEPARIN LEVEL (UNFRACTIONATED): Heparin Unfractionated: 0.4 IU/mL (ref 0.30–0.70)

## 2013-11-28 MED ORDER — METOPROLOL TARTRATE 12.5 MG HALF TABLET
12.5000 mg | ORAL_TABLET | Freq: Once | ORAL | Status: DC
  Filled 2013-11-28: qty 1

## 2013-11-28 MED ORDER — CHLORHEXIDINE GLUCONATE CLOTH 2 % EX PADS
6.0000 | MEDICATED_PAD | Freq: Once | CUTANEOUS | Status: AC
  Administered 2013-11-30: 6 via TOPICAL

## 2013-11-28 MED ORDER — CHLORHEXIDINE GLUCONATE CLOTH 2 % EX PADS
6.0000 | MEDICATED_PAD | Freq: Once | CUTANEOUS | Status: AC
  Administered 2013-12-01: 6 via TOPICAL

## 2013-11-28 MED ORDER — BISACODYL 5 MG PO TBEC: 5.0000 mg | DELAYED_RELEASE_TABLET | Freq: Once | ORAL | Status: DC

## 2013-11-28 MED ORDER — MAGNESIUM HYDROXIDE 400 MG/5ML PO SUSP
30.0000 mL | ORAL | Status: DC | PRN
  Administered 2013-11-30: 30 mL via ORAL
  Filled 2013-11-28: qty 30

## 2013-11-28 MED ORDER — POLYETHYLENE GLYCOL 3350 17 G PO PACK
17.0000 g | PACK | Freq: Every day | ORAL | Status: DC
Start: 2013-11-28 — End: 2013-12-01
  Administered 2013-11-28 – 2013-11-30 (×3): 17 g via ORAL
  Filled 2013-11-28 (×4): qty 1

## 2013-11-28 MED ORDER — HEPARIN (PORCINE) IN NACL 100-0.45 UNIT/ML-% IJ SOLN
1000.0000 [IU]/h | INTRAMUSCULAR | Status: DC
  Administered 2013-11-30: 1000 [IU]/h via INTRAVENOUS
  Filled 2013-11-28 (×4): qty 250

## 2013-11-28 MED ORDER — ALPRAZOLAM 0.25 MG PO TABS
0.2500 mg | ORAL_TABLET | ORAL | Status: DC | PRN
Start: 2013-11-28 — End: 2013-12-01

## 2013-11-28 MED ORDER — TEMAZEPAM 15 MG PO CAPS: 15.0000 mg | ORAL_CAPSULE | Freq: Once | ORAL | Status: DC | PRN

## 2013-11-28 MED ORDER — ALBUTEROL SULFATE (2.5 MG/3ML) 0.083% IN NEBU
2.5000 mg | INHALATION_SOLUTION | Freq: Once | RESPIRATORY_TRACT | Status: AC
  Administered 2013-11-28: 2.5 mg via RESPIRATORY_TRACT

## 2013-11-28 NOTE — Progress Notes (Signed)
ANTICOAGULATION CONSULT NOTE - Follow Up Consult  Pharmacy Consult for heparin Indication: severe CAD pending CABG  No Known Allergies  Patient Measurements: Height: 5\' 2"  (157.5 cm) Weight: 143 lb 11.8 oz (65.2 kg) IBW/kg (Calculated) : 50.1 Heparin Dosing Weight: 63kg  Vital Signs: Temp: 98.5 F (36.9 C) (07/17 0810) Temp src: Oral (07/17 0810) BP: 92/57 mmHg (07/17 0810) Pulse Rate: 96 (07/17 0810)  Labs:  Recent Labs  11/26/13 1730  11/26/13 2322 11/27/13 0057 11/27/13 0640 11/27/13 1034 11/28/13 0422 11/28/13 0939  HGB 12.5  --   --  12.0  --   --  9.9*  --   HCT 36.7  --   --  35.1*  --   --  30.2*  --   PLT 287  --   --  286  --   --  214  --   APTT  --   --   --  73*  --   --   --   --   LABPROT  --   --   --  14.0  --   --   --   --   INR  --   --   --  1.08  --   --   --   --   HEPARINUNFRC  --   --   --  0.49  --   --   --  0.40  CREATININE 0.71  --   --  0.67  --   --   --   --   TROPONINI  --   < > 18.77*  --  15.67* 12.80*  --   --   < > = values in this interval not displayed.  Estimated Creatinine Clearance: 61.3 ml/min (by C-G formula based on Cr of 0.67).   Medications:  Scheduled:  . aspirin EC  81 mg Oral Daily  . atorvastatin  80 mg Oral Daily  . carvedilol  3.125 mg Oral BID WC  . insulin aspart  0-9 Units Subcutaneous TID WC  . isosorbide mononitrate  15 mg Oral Daily  . polyethylene glycol  17 g Oral Daily   Infusions:  . heparin 750 Units/hr (11/28/13 0207)    Assessment: 68 yo female with severe CAD pending CABG consult is currently on therapeutic heparin.  Heparin level is 0.4. Goal of Therapy:  Heparin level 0.3-0.7 units/ml Monitor platelets by anticoagulation protocol: Yes   Plan:  1) Continue heparin at 750 units/hr 2) Daily heparin level and CBC 3) f/u on CABG plan  Lowanda Cashaw, Tsz-Yin 11/28/2013,11:50 AM

## 2013-11-28 NOTE — Plan of Care (Signed)
Problem: Consults Goal: MI Patient Education (See Patient Education module for education specifics.) Outcome: Progressing MI principles (risk factors, meds, s/s, when to call EMS/MD) discussed with patient and family.  Bouncing back from heart attack book given.

## 2013-11-28 NOTE — Progress Notes (Addendum)
Pre-op Cardiac Surgery  Carotid Findings:  Findings suggest 1-39% internal carotid artery stenosis bilaterally. Vertebral arteries are patent with antegrade flow.  11/28/2013 2:04 PM Maudry Mayhew, RVT, RDCS, RDMS    Upper Extremity Right Left  Brachial Pressures 109 Triphasic 109 Triphasic  Radial Waveforms Triphasic Triphasic  Ulnar Waveforms Triphasic Triphasic  Palmar Arch (Allen's Test) Normal *   Findings:  * Left Palmar Arch: Remains normal with radial compression. Decreases greater than 50% with ulnar compression.     Lower  Extremity Right Left  Dorsalis Pedis    Anterior Tibial    Posterior Tibial    Ankle/Brachial Indices      Findings:  Palpable pedal pulses X 4.

## 2013-11-28 NOTE — Progress Notes (Signed)
BP 87/55, Tele ST 102, alert and oriented, denies complaints except no bm x 2 days, MOM ordered. Up to bathroom w/ steady gait, no dizziness.  Pt states skin sweaty but not shaky, CBG checked = 115.  Rt radial level 0, heparin restarted at 0200 per pharmacy order.

## 2013-11-28 NOTE — Consult Note (Signed)
Kathryn Logan       [place] [address]             [phone]        Kathryn Logan [facility] Medical Record #[id number] Date of Birth: Aug 11, 1945  Referring: Dr Tamala Julian Primary Care: Unice Cobble, MD  Chief Complaint:    Chief Complaint  Patient presents with  . Chest Pain  . Back Pain    History of Present Illness:    The patient is a 68 year old female who presented to the emergency department with chest and back pain. She has a history of hyperlipidemia and diabetes. The pain began the morning of presentation on 11/26/2013. He was described as a pressure type sensation. There was also some associated nausea and diaphoresis. She was noted in the emergency department to be tachycardic with a heart rate in the 130's. Initial EKG showed nonspecific T-wave abnormalities. There is significant concern for a ACS and NSEMI.  Initial troponin was elevated and cardiology consultation was obtained. The patient was admitted on nitroglycerin and heparin. Initial pro BNP was noted to be greater than 9000 although she evidenced no clinical congestive heart failure. Thyroid function studies were obtained and TSH/free T4 were normal. She was felt to require cardiac catheterization and this was done on 11/27/2013 by Dr. Daneen Schick with the following results noted:  Left Heart Catheterization with Coronary Angiography Report  Kathryn Logan  68 y.o.  female  28-Oct-1945  Procedure Date: 11/27/2013  Referring Physician: Carlyle Dolly, M.D.  Primary Cardiologist: Carlyle Dolly, M.D.  INDICATIONS: Non-ST elevation MI  PROCEDURE: 1. Left heart catheterization; 2. Left ventriculography; 3. Coronary angiography  CONSENT:  The risks, benefits, and details of the procedure were explained in detail to the patient. Risks including death, stroke, heart attack, kidney injury, allergy, limb ischemia, bleeding and radiation injury were discussed. The patient verbalized  understanding and wanted to proceed. Informed written consent was obtained.  PROCEDURE TECHNIQUE: After Xylocaine anesthesia a 5 French Slender sheath was placed in the right radial artery with an angiocath and the modified Seldinger technique. Coronary angiography was done using a 5 F JR 4 and JL 3.5 cm diagnostic catheter. Left ventriculography was done using the JR 4 catheter and hand injection.  Images were reviewed and discussion with the patient concerning surgery versus PCI. Considering left ventricular systolic dysfunction, proximal LAD involvement, and other focal but present disease in the right and circumflex coronary artery, we decided to proceed with evaluation for surgery. The case was terminated.  A wrist band was used for hemostasis with good results.  CONTRAST: Total of 100 cc.  COMPLICATIONS: none  HEMODYNAMICS: Aortic pressure 91/51 mmHg; LV pressure 94/13 mmHg; LVEDP 16 mmHg  ANGIOGRAPHIC DATA: The left main coronary artery is widely patent.  The left anterior descending artery is large and trans-apical with a proximal eccentric 99% stenosis. This is the culprit vessel for the patient's presentation and wall motion abnormality  The left circumflex artery is dominant. The PDA arises from the circumflex. The first obtuse marginal contains proximal 75-80% obstruction. The vessel distribution is relatively small. The second obtuse marginal is large in caliber and distribution. There is proximal to ostial 95% stenosis. The third, fourth, and fifth obtuse marginals are free of any significant obstruction. There is 50% ostial PDA. The PDA is small.  The right coronary artery is nondominant. There is ostial 70% narrowing with catheter damping. The mid vessel contains segmental 95% stenosis  proximal to the dominant acute marginal branch. The acute marginal branch is graftable.  LEFT VENTRICULOGRAM: Left ventricular angiogram was done in the 30 RAO projection and revealed mid to apical anterior  severe hypokinesis. Inferior apical severe hypokinesis. Estimated ejection fraction 35%.  IMPRESSIONS: 1. Severe coronary artery disease involving the proximal LAD, the first and second marginal branches, and a nondominant right coronary artery.  2. Moderate to severe left ventricular systolic dysfunction and a predominant apical distribution compatible with LAD occlusion. EF is 30-35%.  RECOMMENDATION: Given LV dysfunction, large LAD distribution, and diabetes, CABG and this functional a young 68 year old female would appear to be her best long-term option.  [facility] has been notified. They request Dr. Servando Snare but prefer expedient treatment if they have to wait.   We have been asked to consult for consideration of coronary artery bypass grafting.  Current Activity/ Functional Status: Patient is independent with mobility/ambulation, transfers, ADL's, IADL's.   Zubrod Score: At the time of surgery this patient's most appropriate activity status/level should be described as: [x]     0    Normal activity, no symptoms []     1    Restricted in physical strenuous activity but ambulatory, able to do out light work []     2    Ambulatory and capable of self care, unable to do work activities, up and about                 more than 50%  Of the time                            []     3    Only limited self care, in bed greater than 50% of waking hours []     4    Completely disabled, no self care, confined to bed or chair []     5    Moribund  Past Medical History  Diagnosis Date  . Osteopenia   . Hyperlipidemia   . Diabetes mellitus without complication     Past Surgical History  Procedure Laterality Date  . Tonsillectomy and adenoidectomy    . G 1 p 1    . No colonoscopy      SOC reviewed    History  Smoking status  . Never Smoker   Smokeless tobacco  . Not on file    History  Alcohol Use  . Yes    Comment: Occasionally     History   Social History  . Marital Status: Married     Spouse Name: N/A    Number of Children: N/A  . Years of Education: N/A   Occupational History  . Not on file.   Social History Main Topics  . Smoking status: Never Smoker   . Smokeless tobacco: Not on file  . Alcohol Use: Yes     Comment: Occasionally   . Drug Use: No  . Sexual Activity: Not on file   Other Topics Concern  . Not on file   Social History Narrative  . No narrative on file    No Known Allergies  Current Facility-Administered Medications  Medication Dose Route Frequency Provider Last Rate Last Dose  . acetaminophen (TYLENOL) tablet 650 mg  650 mg Oral Q4H PRN Belva Crome III, MD      . aspirin EC tablet 81 mg  81 mg Oral Daily Cecilie Kicks, NP   81 mg at 11/28/13 1018  . atorvastatin (LIPITOR) tablet  80 mg  80 mg Oral Daily Cecilie Kicks, NP   80 mg at 11/28/13 1018  . carvedilol (COREG) tablet 3.125 mg  3.125 mg Oral BID WC Tarri Fuller, PA-C   3.125 mg at 11/27/13 2200  . heparin ADULT infusion 100 units/mL (25000 units/250 mL)  750 Units/hr Intravenous Continuous Erik Obey, RPH 7.5 mL/hr at 11/28/13 0207 750 Units/hr at 11/28/13 0207  . insulin aspart (novoLOG) injection 0-9 Units  0-9 Units Subcutaneous TID WC Cecilie Kicks, NP      . isosorbide mononitrate (IMDUR) 24 hr tablet 15 mg  15 mg Oral Daily Tarri Fuller, PA-C      . magnesium hydroxide (MILK OF MAGNESIA) suspension 30 mL  30 mL Oral PRN Arnoldo Lenis, MD      . nitroGLYCERIN (NITROSTAT) SL tablet 0.4 mg  0.4 mg Sublingual Q5 Min x 3 PRN Cecilie Kicks, NP      . ondansetron [facility]) injection 4 mg  4 mg Intravenous Q6H PRN Belva Crome III, MD      . polyethylene glycol (MIRALAX / GLYCOLAX) packet 17 g  17 g Oral Daily Tarri Fuller, PA-C   17 g at 11/28/13 1214  . zolpidem (AMBIEN) tablet 5 mg  5 mg Oral QHS PRN Cecilie Kicks, NP        Prescriptions prior to admission  Medication Sig Dispense Refill  . aspirin EC 81 MG tablet Take 81 mg by mouth daily.       [name] atorvastatin (LIPITOR) 20 MG  tablet Take 1 tablet (20 mg total) by mouth daily.  90 tablet  0  . BIOTIN PO Take 2,500 mcg by mouth daily.       [name] glucose blood (ONE TOUCH ULTRA TEST) test strip Check blood sugar daily as directed, DX 250.02  100 each  12  . lanolin-mineral oil (BABY OIL) OIL Apply 1 application topically every other day.      . metFORMIN (GLUCOPHAGE) 500 MG tablet Take 500 mg by mouth 2 (two) times daily with a meal.      . ONETOUCH DELICA LANCETS FINE MISC 1 each by Does not apply route as directed. Check blood sugar daily as directed, DX 250.02 HOLD UNTIL PATIENT REQUEST  100 each  5  . pioglitazone (ACTOS) 30 MG tablet Take 1 tablet (30 mg total) by mouth daily.  30 tablet  3    Family History  Problem Relation Age of Onset  . Heart attack Mother 73  . Diabetes Mother   . Stroke Mother   . Hypertension Mother   . Goiter Mother   . Heart attack Father     >55  . Bladder Cancer Father   . Parkinson's disease Maternal Aunt   . Osteoporosis Sister   . Diabetes Brother      Review of Systems:     Cardiac Review of Systems: Y or N  Chest Pain [ y   ]  Resting SOB [ n  ] Exertional SOB  [n  ]  Orthopnea [ n ]   Pedal Edema [ n  ]    Palpitations [ n ] Syncope  [ n ]   Presyncope [n  ]  General Review of Systems: [Y] = yes [  ]=no Constitional: recent weight change [n  ]; anorexia [n  ]; fatigue Blue.Reese  ]; nausea [n  ]; night sweats [n  ]; fever [ nn ]; or chills [  ]  Dental: poor dentition[  ]; Last Dentist visit:   Eye : blurred vision [ n ]; diplopia [ n  ]; vision changes [ n ];  Amaurosis fugax[ n ]; Resp: cough [n  ];  wheezing[n  ];  hemoptysis[n ]; shortness of breath[  nn]; paroxysmal nocturnal dyspnea[n  ]; dyspnea on exertion[n  ]; or orthopnea[  n];  GI:  gallstones[ n ], vomiting[n  ];  dysphagia[ n ]; melena[  n];  hematochezia [n  ]; heartburn[n  ];   Hx of  Colonoscopy[  ]; GU: kidney stones [n  ]; hematuria[ n ];   dysuria  [  n];  nocturia[ n ];  history of     obstruction [ n ]; urinary frequency [  n]             Skin: rash, swelling[  n];, hair loss[ n ];  peripheral edema[ n ];  or itching[n  ]; Musculosketetal: myalgias[n  ];  joint swelling[n  ];  joint erythema[n  ];  joint pain[ n ];  back pain[ n ];  Heme/Lymph: bruising[ n ];  bleeding[n  ];  anemia[ n ];  Neuro: TIA[ n ];  headaches[ n ];  stroke[  n];  vertigo[ n ];  seizures[n  ];   paresthesias[ n ];  difficulty walking[ n ];  Psych:depression[n  ]; anxiety[ n ];  Endocrine: diabetes[ y ];  thyroid dysfunction[n ];  Immunizations: Flu [  n]; Pneumococcal[y 08/2012  ];  Other:  Physical Exam: BP 117/77  Pulse 110  Temp(Src) 98.6 F (37 C) (Oral)  Resp 18  Ht 5\' 2"  (1.575 m)  Wt 143 lb 11.8 oz (65.2 kg)  BMI 26.28 kg/m2  SpO2 98%  General appearance: alert, cooperative and no distress Neurologic: intact Heart: regular rate and rhythm, S1, S2 normal, no murmur, click, rub or gallop Lungs: clear to auscultation bilaterally Abdomen: soft, non-tender; bowel sounds normal; no masses,  no organomegaly Extremities: extremities normal, atraumatic, no cyanosis or edema and no edema, redness or tenderness in the calves or thighs Wound: n/a Skin: no rashes or lesions Gu/rectal: defer HEENT: NCAT Peerl, eomi anict, pharynx clear, teeth- good repair Patient has no carotid bruits, no cervical or supraclavicular adenopathy Full DP and PT pulses bilaterally  Diagnostic Studies & Laboratory data:     Recent Radiology Findings:   Dg Chest Port 1 View  11/26/2013   CLINICAL DATA:  Chest pain, back pain  EXAM: PORTABLE CHEST - 1 VIEW  COMPARISON:  Portable exam 1852 hr without priors for comparison.  FINDINGS: Minimal enlargement of cardiac silhouette.  Pulmonary vascularity and mediastinal contours normal.  Minimal bibasilar atelectasis.  Slight accentuation of perihilar markings without segmental consolidation, pleural effusion or pneumothorax.  Bones  unremarkable.  IMPRESSION: Minimal bibasilar atelectasis enlargement of cardiac silhouette.   Electronically Signed   By: Lavonia Dana M.D.   On: 11/26/2013 19:35      Recent Lab Findings: Lab Results  Component Value Date   WBC 8.7 11/28/2013   HGB 9.9* 11/28/2013   HCT 30.2* 11/28/2013   PLT 214 11/28/2013   GLUCOSE 121* 11/27/2013   CHOL 138 11/27/2013   TRIG 77 11/27/2013   HDL 54 11/27/2013   LDLDIRECT 179.1 03/04/2013   LDLCALC 69 11/27/2013   ALT 25 03/04/2013   AST 22 03/04/2013   NA 140 11/27/2013   K 3.9 11/27/2013   CL 101 11/27/2013   CREATININE 0.67 11/27/2013   BUN 27* 11/27/2013  CO2 21 11/27/2013   TSH 3.470 11/26/2013   INR 1.08 11/27/2013   HGBA1C 6.2* 11/27/2013      Assessment / Plan:   1 Recent NSTEMI 2. Severe coronary artery disease involving the proximal LAD, the first and second marginal branches, and a nondominant right coronary artery.  3.  Moderate to severe left ventricular systolic dysfunction and a predominant apical distribution compatible with LAD occlusion. EF is 30-35%.  4. preoperative anemia with hemoglobin hematocrit of 30/9.9-unknown cause  I discussed with the patient her husband and son in detail the risks and options of proceeding with bypass surgery. I recommended because of being diabetic and having critical three-vessel coronary artery disease that coronary bypass grafting offers the best option for treatment with relief of symptoms and prevention of further decrease in LV function. We'll plan to proceed Monday, July 21.  The goals risks and alternatives of the planned surgical procedure coronary artery bypass grafting have been discussed with the patient in detail. The risks of the procedure including death, infection, stroke, myocardial infarction, bleeding, blood transfusion have all been discussed specifically.  I have quoted Kathryn Logan a 2% % of perioperative mortality and a complication rate as high as 25% %. The patient's questions have  been answered.Kathryn Logan is willing  to proceed with the planned procedure.   Grace Isaac MD      [address] [place] [address] Office [phone]   Beeper [phone]

## 2013-11-28 NOTE — Progress Notes (Signed)
  Echocardiogram 2D Echocardiogram has been performed.  Kathryn Logan 11/28/2013, 1:50 PM

## 2013-11-28 NOTE — Progress Notes (Signed)
SUBJECTIVE:  No chest pain.  Awaiting surgical consult.  OBJECTIVE:   Vitals:   Filed Vitals:   11/27/13 2347 11/28/13 0414 11/28/13 0435 11/28/13 0810  BP: 115/59 83/48 87/55  92/57  Pulse: 121 106 103 96  Temp: 98 F (36.7 C) 98.6 F (37 C)  98.5 F (36.9 C)  TempSrc: Oral Oral  Oral  Resp: 18 18  18   Height:      Weight: 143 lb 11.8 oz (65.2 kg)     SpO2: 98% 94% 97% 96%   I&O's:   Intake/Output Summary (Last 24 hours) at 11/28/13 1220 Last data filed at 11/28/13 0930  Gross per 24 hour  Intake 2980.5 ml  Output   1000 ml  Net 1980.5 ml   TELEMETRY: Reviewed telemetry pt in NSR:     PHYSICAL EXAM General: Well developed, well nourished, in no acute distress Head:   Normal cephalic and atramatic  Lungs:   Clear bilaterally to auscultation. Heart:   HRRR S1 S2  No JVD.   Abdomen: abdomen soft and non-tender Msk:  Back normal,  Normal strength and tone for age. Extremities:   No edema.  2+ right radial pulse; no hematoma Neuro: Alert and oriented. Psych:  Normal affect, responds appropriately   LABS: Basic Metabolic Panel:  Recent Labs  11/26/13 1730 11/27/13 0057  NA 139 140  K 4.1 3.9  CL 101 101  CO2 18* 21  GLUCOSE 111* 121*  BUN 32* 27*  CREATININE 0.71 0.67  CALCIUM 10.0 9.3  MG  --  1.7   Liver Function Tests: No results found for this basename: AST, ALT, ALKPHOS, BILITOT, PROT, ALBUMIN,  in the last 72 hours No results found for this basename: LIPASE, AMYLASE,  in the last 72 hours CBC:  Recent Labs  11/27/13 0057 11/28/13 0422  WBC 12.6* 8.7  HGB 12.0 9.9*  HCT 35.1* 30.2*  MCV 87.1 87.8  PLT 286 214   Cardiac Enzymes:  Recent Labs  11/26/13 2322 11/27/13 0640 11/27/13 1034  TROPONINI 18.77* 15.67* 12.80*   BNP: No components found with this basename: POCBNP,  D-Dimer: No results found for this basename: DDIMER,  in the last 72 hours Hemoglobin A1C:  Recent Labs  11/27/13 0057  HGBA1C 6.2*   Fasting Lipid  Panel:  Recent Labs  11/27/13 0057  CHOL 138  HDL 54  LDLCALC 69  TRIG 77  CHOLHDL 2.6   Thyroid Function Tests:  Recent Labs  11/26/13 2322  TSH 3.470   Anemia Panel: No results found for this basename: VITAMINB12, FOLATE, FERRITIN, TIBC, IRON, RETICCTPCT,  in the last 72 hours Coag Panel:   Lab Results  Component Value Date   INR 1.08 11/27/2013    RADIOLOGY: Dg Chest Port 1 View  11/26/2013   CLINICAL DATA:  Chest pain, back pain  EXAM: PORTABLE CHEST - 1 VIEW  COMPARISON:  Portable exam 1852 hr without priors for comparison.  FINDINGS: Minimal enlargement of cardiac silhouette.  Pulmonary vascularity and mediastinal contours normal.  Minimal bibasilar atelectasis.  Slight accentuation of perihilar markings without segmental consolidation, pleural effusion or pneumothorax.  Bones unremarkable.  IMPRESSION: Minimal bibasilar atelectasis enlargement of cardiac silhouette.   Electronically Signed   By: Lavonia Dana M.D.   On: 11/26/2013 19:35      ASSESSMENT: Multivessel CAD  PLAN:  Plan for CABG.  Patient requested Dr. Servando Snare.  Will see what his availability is for CABG.  Aggressive secondary prevention including statin.  Kathryn Logan  S., MD  11/28/2013  12:20 PM

## 2013-11-29 LAB — PULMONARY FUNCTION TEST
FEF 25-75 Post: 0.96 L/sec
FEF 25-75 Pre: 1.36 L/sec
FEF2575-%Change-Post: -29 %
FEF2575-%Pred-Post: 49 %
FEF2575-%Pred-Pre: 70 %
FEV1-%Change-Post: -5 %
FEV1-%Pred-Post: 74 %
FEV1-%Pred-Pre: 78 %
FEV1-Post: 1.62 L
FEV1-Pre: 1.71 L
FEV1FVC-%Change-Post: 2 %
FEV1FVC-%Pred-Pre: 98 %
FEV6-%Change-Post: -7 %
FEV6-%Pred-Post: 76 %
FEV6-%Pred-Pre: 82 %
FEV6-Post: 2.1 L
FEV6-Pre: 2.27 L
FEV6FVC-%Pred-Post: 104 %
FEV6FVC-%Pred-Pre: 104 %
FVC-%Change-Post: -7 %
FVC-%Pred-Post: 73 %
FVC-%Pred-Pre: 79 %
FVC-Post: 2.1 L
FVC-Pre: 2.27 L
Post FEV1/FVC ratio: 77 %
Post FEV6/FVC ratio: 100 %
Pre FEV1/FVC ratio: 75 %
Pre FEV6/FVC Ratio: 100 %

## 2013-11-29 LAB — CBC
HCT: 32.8 % — ABNORMAL LOW (ref 36.0–46.0)
Hemoglobin: 10.5 g/dL — ABNORMAL LOW (ref 12.0–15.0)
MCH: 28.5 pg (ref 26.0–34.0)
MCHC: 32 g/dL (ref 30.0–36.0)
MCV: 88.9 fL (ref 78.0–100.0)
PLATELETS: 234 10*3/uL (ref 150–400)
RBC: 3.69 MIL/uL — ABNORMAL LOW (ref 3.87–5.11)
RDW: 15 % (ref 11.5–15.5)
WBC: 8.7 10*3/uL (ref 4.0–10.5)

## 2013-11-29 LAB — GLUCOSE, CAPILLARY
GLUCOSE-CAPILLARY: 96 mg/dL (ref 70–99)
GLUCOSE-CAPILLARY: 103 mg/dL — AB (ref 70–99)
GLUCOSE-CAPILLARY: 95 mg/dL (ref 70–99)
Glucose-Capillary: 164 mg/dL — ABNORMAL HIGH (ref 70–99)

## 2013-11-29 LAB — HEPARIN LEVEL (UNFRACTIONATED)
HEPARIN UNFRACTIONATED: 0.57 [IU]/mL (ref 0.30–0.70)
HEPARIN UNFRACTIONATED: 0.41 [IU]/mL (ref 0.30–0.70)
Heparin Unfractionated: 0.18 IU/mL — ABNORMAL LOW (ref 0.30–0.70)

## 2013-11-29 MED ORDER — DIPHENHYDRAMINE HCL 25 MG PO CAPS
25.0000 mg | ORAL_CAPSULE | Freq: Four times a day (QID) | ORAL | Status: DC | PRN
  Administered 2013-11-29 – 2013-11-30 (×2): 25 mg via ORAL
  Filled 2013-11-29 (×2): qty 1

## 2013-11-29 MED ORDER — HEPARIN BOLUS VIA INFUSION
2000.0000 [IU] | Freq: Once | INTRAVENOUS | Status: AC
  Administered 2013-11-29: 2000 [IU] via INTRAVENOUS
  Filled 2013-11-29: qty 2000

## 2013-11-29 MED ORDER — DIPHENHYDRAMINE HCL 50 MG/ML IJ SOLN
25.0000 mg | Freq: Once | INTRAMUSCULAR | Status: AC
  Administered 2013-11-29: 25 mg via INTRAVENOUS
  Filled 2013-11-29: qty 1

## 2013-11-29 NOTE — Progress Notes (Signed)
Subjective: NO CP  No SOB  Has rash that itches on chest   No wheezing.  Objective: Filed Vitals:   11/29/13 0329 11/29/13 0500 11/29/13 0700 11/29/13 0800  BP:  102/48 96/53 96/50   Pulse:  94 96 96  Temp: 98.8 F (37.1 C)   98.1 F (36.7 C)  TempSrc: Oral   Oral  Resp:  19 17 16   Height:      Weight:      SpO2:  94% 95% 97%   Weight change: 2 lb 6.8 oz (1.1 kg)  Intake/Output Summary (Last 24 hours) at 11/29/13 1202 Last data filed at 11/28/13 1228  Gross per 24 hour  Intake      0 ml  Output    300 ml  Net   -300 ml    General: Alert, awake, oriented x3, in no acute distress Neck:  JVP is normal Heart: Regular rate and rhythm, without murmurs, rubs, gallops.  Chest:  Macular rash on chest mildy erythematous.   Lungs: Clear to auscultation.  No rales or wheezes. Exemities:  No edema.   Neuro: Grossly intact, nonfocal.   Lab Results: Results for orders placed during the hospital encounter of 11/26/13 (from the past 24 hour(s))  GLUCOSE, CAPILLARY     Status: Abnormal   Collection Time    11/28/13 12:26 PM      Result Value Ref Range   Glucose-Capillary 108 (*) 70 - 99 mg/dL   Comment 1 Notify RN    TYPE AND SCREEN     Status: None   Collection Time    11/28/13  7:24 PM      Result Value Ref Range   ABO/RH(D) A POS     Antibody Screen POS     Sample Expiration 12/01/2013     Antibody Identification NO CLINICALLY SIGNIFICANT ANTIBODY IDENTIFIED     DAT, IgG NEG     Unit Number [id number]     Blood Component Type RED CELLS,LR     Unit division 00     Status of Unit ALLOCATED     Transfusion Status OK TO TRANSFUSE     Crossmatch Result COMPATIBLE     Unit Number [id number]     Blood Component Type RED CELLS,LR     Unit division 00     Status of Unit ALLOCATED     Transfusion Status OK TO TRANSFUSE     Crossmatch Result COMPATIBLE     Unit Number [id number]     Blood Component Type RED CELLS,LR     Unit division 00     Status of Unit ALLOCATED      Transfusion Status OK TO TRANSFUSE     Crossmatch Result COMPATIBLE     Unit Number [id number]     Blood Component Type RED CELLS,LR     Unit division 00     Status of Unit ALLOCATED     Transfusion Status OK TO TRANSFUSE     Crossmatch Result COMPATIBLE    BASIC METABOLIC PANEL     Status: Abnormal   Collection Time    11/28/13  7:24 PM      Result Value Ref Range   Sodium 140  137 - 147 mEq/L   Potassium 4.2  3.7 - 5.3 mEq/L   Chloride 105  96 - 112 mEq/L   CO2 20  19 - 32 mEq/L   Glucose, Bld 148 (*) 70 - 99 mg/dL   BUN 17  6 - 23 mg/dL  Creatinine, Ser 0.61  0.50 - 1.10 mg/dL   Calcium 8.9  8.4 - 10.5 mg/dL   GFR calc non Af Amer >90  >90 mL/min   GFR calc Af Amer >90  >90 mL/min   Anion gap 15  5 - 15  CBC     Status: Abnormal   Collection Time    11/29/13  3:26 AM      Result Value Ref Range   WBC 8.7  4.0 - 10.5 K/uL   RBC 3.69 (*) 3.87 - 5.11 MIL/uL   Hemoglobin 10.5 (*) 12.0 - 15.0 g/dL   HCT 32.8 (*) 36.0 - 46.0 %   MCV 88.9  78.0 - 100.0 fL   MCH 28.5  26.0 - 34.0 pg   MCHC 32.0  30.0 - 36.0 g/dL   RDW 15.0  11.5 - 15.5 %   Platelets 234  150 - 400 K/uL  HEPARIN LEVEL (UNFRACTIONATED)     Status: Abnormal   Collection Time    11/29/13  3:26 AM      Result Value Ref Range   Heparin Unfractionated 0.18 (*) 0.30 - 0.70 IU/mL  GLUCOSE, CAPILLARY     Status: Abnormal   Collection Time    11/29/13  8:51 AM      Result Value Ref Range   Glucose-Capillary 164 (*) 70 - 99 mg/dL  HEPARIN LEVEL (UNFRACTIONATED)     Status: None   Collection Time    11/29/13 10:20 AM      Result Value Ref Range   Heparin Unfractionated 0.57  0.30 - 0.70 IU/mL    Studies/Results: No results found.  Medications: REviewed   @PROBHOSP @  1.  CAD LAD 99% prox.  LCx:  75 to 80% prox  OM2 ostial 95% stenosis; RCA nondominant  70% ostiral narrowing then 95% mid.  LVEF 35% .    Plan for CABG on Monday  2.  HL  Statin.    3  CV disease  Mild  Statin rx    4.  Rash  I have  asked pharmacy to look at meds.    LOS: 3 days   Kathryn Logan 11/29/2013, 12:02 PM

## 2013-11-29 NOTE — Progress Notes (Signed)
Pt c/o itching on her chest - generalized red rash noted - [place], PA notified  Pt currently up to chair - no c/o pain - eating breakfast.  Will continue to closely monitor.

## 2013-11-29 NOTE — Progress Notes (Signed)
ANTICOAGULATION CONSULT NOTE - Follow Up Consult  Pharmacy Consult for heparin Indication: CAD awaiting CABG  Labs:  Recent Labs  11/26/13 1730  11/26/13 2322  11/27/13 0057 11/27/13 0640 11/27/13 1034 11/28/13 0422 11/28/13 0939 11/28/13 1924 11/29/13 0326 11/29/13 1020  HGB 12.5  --   --   --  12.0  --   --  9.9*  --   --  10.5*  --   HCT 36.7  --   --   --  35.1*  --   --  30.2*  --   --  32.8*  --   PLT 287  --   --   --  286  --   --  214  --   --  234  --   APTT  --   --   --   --  73*  --   --   --   --   --   --   --   LABPROT  --   --   --   --  14.0  --   --   --   --   --   --   --   INR  --   --   --   --  1.08  --   --   --   --   --   --   --   HEPARINUNFRC  --   --   --   < > 0.49  --   --   --  0.40  --  0.18* 0.57  CREATININE 0.71  --   --   --  0.67  --   --   --   --  0.61  --   --   TROPONINI  --   < > 18.77*  --   --  15.67* 12.80*  --   --   --   --   --   < > = values in this interval not displayed.   Assessment: 68yo female with severe CAD pending CABG on Monday now therapeutic on heparin after re-bolus and increased rate. H/H low but improved, plt wnl and stable with no reported s/s bleeding.   Goal of Therapy:  Heparin level 0.3-0.7 units/ml   Plan:  - Continue heparin at 900 units/hr - F/u confirmatory HL in 6 hr - Daily HL, CBC, s/s bleeding - CABG planned for Monday  Harolyn Rutherford, PharmD Clinical Pharmacist - Resident Pager: [phone] Pharmacy: [phone] 11/29/2013 11:31 AM

## 2013-11-29 NOTE — Progress Notes (Signed)
ANTICOAGULATION CONSULT NOTE - Follow Up Consult  Pharmacy Consult for heparin Indication: CAD awaiting CABG  Labs:  Recent Labs  11/26/13 2322  11/27/13 0057 11/27/13 0640 11/27/13 1034 11/28/13 0422  11/28/13 1924 11/29/13 0326 11/29/13 1020 11/29/13 1713  HGB  --   < > 12.0  --   --  9.9*  --   --  10.5*  --   --   HCT  --   --  35.1*  --   --  30.2*  --   --  32.8*  --   --   PLT  --   --  286  --   --  214  --   --  234  --   --   APTT  --   --  73*  --   --   --   --   --   --   --   --   LABPROT  --   --  14.0  --   --   --   --   --   --   --   --   INR  --   --  1.08  --   --   --   --   --   --   --   --   HEPARINUNFRC  --   --  0.49  --   --   --   < >  --  0.18* 0.57 0.41  CREATININE  --   --  0.67  --   --   --   --  0.61  --   --   --   TROPONINI 18.77*  --   --  15.67* 12.80*  --   --   --   --   --   --   < > = values in this interval not displayed.   Assessment: 68yo female with severe CAD pending CABG on Monday remains therapeutic on heparin. H/H low but improved, plt wnl and stable. RN reports no s/s of bleeding.   Goal of Therapy:  Heparin level 0.3-0.7 units/ml   Plan:  - Continue heparin at 900 units/hr - Daily HL, CBC, s/s bleeding - CABG planned for Monday  Albertina Parr, PharmD.  Clinical Pharmacist Pager [phone]

## 2013-11-29 NOTE — Progress Notes (Signed)
ANTICOAGULATION CONSULT NOTE - Follow Up Consult  Pharmacy Consult for heparin Indication: CAD awaiting CABG  Labs:  Recent Labs  11/26/13 1730  11/26/13 2322 11/27/13 0057 11/27/13 0640 11/27/13 1034 11/28/13 0422 11/28/13 0939 11/28/13 1924 11/29/13 0326  HGB 12.5  --   --  12.0  --   --  9.9*  --   --  10.5*  HCT 36.7  --   --  35.1*  --   --  30.2*  --   --  32.8*  PLT 287  --   --  286  --   --  214  --   --  234  APTT  --   --   --  73*  --   --   --   --   --   --   LABPROT  --   --   --  14.0  --   --   --   --   --   --   INR  --   --   --  1.08  --   --   --   --   --   --   HEPARINUNFRC  --   --   --  0.49  --   --   --  0.40  --  0.18*  CREATININE 0.71  --   --  0.67  --   --   --   --  0.61  --   TROPONINI  --   < > 18.77*  --  15.67* 12.80*  --   --   --   --   < > = values in this interval not displayed.   Assessment: 68yo female now subtherapeutic on heparin after two levels at goal, no issues w/ gtt per RN, plan for CABG Monday.  Goal of Therapy:  Heparin level 0.3-0.7 units/ml   Plan:  Will give heparin 2000 units IV bolus x1 and increase gtt by 3 units/kg/hr to 1000 units/hr and check level in 6hr.  Wynona Neat, PharmD, BCPS  11/29/2013,4:02 AM

## 2013-11-29 NOTE — Progress Notes (Signed)
RN called, pt c/o rash on her chest. No new medications. Benadryl prn ordered.   Kerin Ransom PA-C 11/29/2013 8:34 AM

## 2013-11-29 NOTE — Progress Notes (Signed)
Pt placed on tele box - up to chair since waking this morning.  Pt to bathroom to clean up independently.  No c/o chest pain or discomfort.  Pt educated of the importance of relaying any changes, SOB or pain to staff immediately - pt verbalizes understanding and has no questions or concerns at this time.  Will continue to closely monitor.

## 2013-11-29 NOTE — Progress Notes (Signed)
CARDIAC REHAB PHASE I   PRE:  Rate/Rhythm: 95 ST  BP:  Sitting: 103/54      SaO2: 97 RA  MODE:  Ambulation: 350 ft   POST:  Rate/Rhythm: 95 ST  BP:  Sitting: 115/67     SaO2: 98 RA  1045-1130 Patient ambulated in hallway independently x 1 assist. Steady gait noted. Patient denied c/o SOB or CP. Post ambulation, patient to chair with call bell and phone in reach. Pre-op CABG education complete. Cardiac surgery booklet and IS given. Patient demonstrated correct use of IS. Patient encouraged to watch pre-op video. Will follow after surgery.  Santina Evans, BSN 11/29/2013 11:39 AM

## 2013-11-30 LAB — COMPREHENSIVE METABOLIC PANEL
ALT: 19 U/L (ref 0–35)
AST: 24 U/L (ref 0–37)
Albumin: 3.2 g/dL — ABNORMAL LOW (ref 3.5–5.2)
Alkaline Phosphatase: 74 U/L (ref 39–117)
Anion gap: 19 — ABNORMAL HIGH (ref 5–15)
BUN: 17 mg/dL (ref 6–23)
CO2: 20 mEq/L (ref 19–32)
Calcium: 9.3 mg/dL (ref 8.4–10.5)
Chloride: 102 mEq/L (ref 96–112)
Creatinine, Ser: 0.78 mg/dL (ref 0.50–1.10)
GFR calc Af Amer: >90 mL/min (ref >90)
GFR calc non Af Amer: 85 mL/min — ABNORMAL LOW (ref >90)
Glucose, Bld: 166 mg/dL — ABNORMAL HIGH (ref 70–99)
Potassium: 4.1 mEq/L (ref 3.7–5.3)
Sodium: 141 mEq/L (ref 137–147)
Total Bilirubin: 0.4 mg/dL (ref 0.3–1.2)
Total Protein: 6.9 g/dL (ref 6.0–8.3)

## 2013-11-30 LAB — HEMOGLOBIN A1C
Hgb A1c MFr Bld: 6.1 % — ABNORMAL HIGH (ref <5.7)
Mean Plasma Glucose: 128 mg/dL — ABNORMAL HIGH (ref <117)

## 2013-11-30 LAB — APTT: aPTT: 74 seconds — ABNORMAL HIGH (ref 24–37)

## 2013-11-30 LAB — CBC
HCT: 29.3 % — ABNORMAL LOW (ref 36.0–46.0)
Hemoglobin: 9.7 g/dL — ABNORMAL LOW (ref 12.0–15.0)
MCH: 29.1 pg (ref 26.0–34.0)
MCHC: 33.1 g/dL (ref 30.0–36.0)
MCV: 88 fL (ref 78.0–100.0)
PLATELETS: 238 10*3/uL (ref 150–400)
RBC: 3.33 MIL/uL — AB (ref 3.87–5.11)
RDW: 14.9 % (ref 11.5–15.5)
WBC: 7.8 10*3/uL (ref 4.0–10.5)

## 2013-11-30 LAB — PROTIME-INR
INR: 1.02 (ref 0.00–1.49)
Prothrombin Time: 13.4 seconds (ref 11.6–15.2)

## 2013-11-30 LAB — URINALYSIS, ROUTINE W REFLEX MICROSCOPIC
Bilirubin Urine: NEGATIVE
Glucose, UA: NEGATIVE mg/dL
Hgb urine dipstick: NEGATIVE
Ketones, ur: NEGATIVE mg/dL
Leukocytes, UA: NEGATIVE
Nitrite: NEGATIVE
Protein, ur: NEGATIVE mg/dL
Specific Gravity, Urine: 1.013 (ref 1.005–1.030)
Urobilinogen, UA: 1 mg/dL (ref 0.0–1.0)
pH: 5 (ref 5.0–8.0)

## 2013-11-30 LAB — GLUCOSE, CAPILLARY
Glucose-Capillary: 101 mg/dL — ABNORMAL HIGH (ref 70–99)
Glucose-Capillary: 142 mg/dL — ABNORMAL HIGH (ref 70–99)
Glucose-Capillary: 90 mg/dL (ref 70–99)
Glucose-Capillary: 116 mg/dL — ABNORMAL HIGH (ref 70–99)

## 2013-11-30 LAB — POCT I-STAT 3, ART BLOOD GAS (G3+)
Bicarbonate: 22.3 mEq/L (ref 20.0–24.0)
O2 SAT: 98 %
TCO2: 23 mmol/L (ref 0–100)
pCO2 arterial: 27.8 mmHg — ABNORMAL LOW (ref 35.0–45.0)
pH, Arterial: 7.512 — ABNORMAL HIGH (ref 7.350–7.450)
pO2, Arterial: 87 mmHg (ref 80.0–100.0)

## 2013-11-30 LAB — HEPARIN LEVEL (UNFRACTIONATED): HEPARIN UNFRACTIONATED: 0.38 [IU]/mL (ref 0.30–0.70)

## 2013-11-30 MED ORDER — SODIUM CHLORIDE 0.9 % IV SOLN
INTRAVENOUS | Status: AC
  Administered 2013-12-01: 69.8 mL/h via INTRAVENOUS
  Filled 2013-11-30: qty 40

## 2013-11-30 MED ORDER — NITROGLYCERIN IN D5W 200-5 MCG/ML-% IV SOLN
2.0000 ug/min | INTRAVENOUS | Status: AC
  Administered 2013-12-01: 5 ug/min via INTRAVENOUS
  Filled 2013-11-30: qty 250

## 2013-11-30 MED ORDER — PHENYLEPHRINE HCL 10 MG/ML IJ SOLN
30.0000 ug/min | INTRAVENOUS | Status: DC
  Filled 2013-11-30: qty 2

## 2013-11-30 MED ORDER — EPINEPHRINE HCL 1 MG/ML IJ SOLN
0.5000 ug/min | INTRAMUSCULAR | Status: DC
  Filled 2013-11-30: qty 4

## 2013-11-30 MED ORDER — DEXTROSE 5 % IV SOLN
750.0000 mg | INTRAVENOUS | Status: DC
  Filled 2013-11-30: qty 750

## 2013-11-30 MED ORDER — METOPROLOL TARTRATE 25 MG PO TABS
25.0000 mg | ORAL_TABLET | Freq: Two times a day (BID) | ORAL | Status: DC
  Administered 2013-11-30: 25 mg via ORAL
  Filled 2013-11-30 (×3): qty 1

## 2013-11-30 MED ORDER — POTASSIUM CHLORIDE 2 MEQ/ML IV SOLN
80.0000 meq | INTRAVENOUS | Status: DC
  Filled 2013-11-30: qty 40

## 2013-11-30 MED ORDER — SODIUM CHLORIDE 0.9 % IV SOLN
INTRAVENOUS | Status: DC
  Filled 2013-11-30: qty 30

## 2013-11-30 MED ORDER — SODIUM CHLORIDE 0.9 % IV SOLN
INTRAVENOUS | Status: AC
  Administered 2013-12-01: 1 [IU]/h via INTRAVENOUS
  Administered 2013-12-01: 1.4 [IU]/h via INTRAVENOUS
  Filled 2013-11-30: qty 1

## 2013-11-30 MED ORDER — PLASMA-LYTE 148 IV SOLN
INTRAVENOUS | Status: AC
  Administered 2013-12-01: 08:00:00
  Filled 2013-11-30: qty 2.5

## 2013-11-30 MED ORDER — DOPAMINE-DEXTROSE 3.2-5 MG/ML-% IV SOLN
2.0000 ug/kg/min | INTRAVENOUS | Status: AC
  Administered 2013-12-01: 3 ug/kg/min via INTRAVENOUS
  Filled 2013-11-30: qty 250

## 2013-11-30 MED ORDER — DEXMEDETOMIDINE HCL IN NACL 400 MCG/100ML IV SOLN
0.1000 ug/kg/h | INTRAVENOUS | Status: AC
  Administered 2013-12-01: 0.2 ug/kg/h via INTRAVENOUS
  Filled 2013-11-30: qty 100

## 2013-11-30 MED ORDER — BISACODYL 5 MG PO TBEC
5.0000 mg | DELAYED_RELEASE_TABLET | Freq: Once | ORAL | Status: AC
  Administered 2013-11-30: 5 mg via ORAL
  Filled 2013-11-30: qty 1

## 2013-11-30 MED ORDER — DEXTROSE 5 % IV SOLN
1.5000 g | INTRAVENOUS | Status: AC
  Administered 2013-12-01: .75 g via INTRAVENOUS
  Administered 2013-12-01: 1.5 g via INTRAVENOUS
  Filled 2013-11-30 (×2): qty 1.5

## 2013-11-30 MED ORDER — VANCOMYCIN HCL 10 G IV SOLR
1250.0000 mg | INTRAVENOUS | Status: AC
  Administered 2013-12-01: 1250 mg via INTRAVENOUS
  Filled 2013-11-30 (×2): qty 1250

## 2013-11-30 MED ORDER — MAGNESIUM SULFATE 50 % IJ SOLN
40.0000 meq | INTRAMUSCULAR | Status: DC
  Filled 2013-11-30: qty 10

## 2013-11-30 NOTE — Progress Notes (Addendum)
Subjective: Denies CP  No SOB  Itching on chest less Objective: Filed Vitals:   11/29/13 2036 11/29/13 2345 11/30/13 0408 11/30/13 0818  BP:  98/53 109/62 112/60  Pulse:  100 99 99  Temp: 98.4 F (36.9 C) 99.4 F (37.4 C) 98.5 F (36.9 C) 99.1 F (37.3 C)  TempSrc: Oral Oral Oral Oral  Resp:  18 20   Height:      Weight:   147 lb 14.9 oz (67.1 kg)   SpO2: 94% 93% 93%    Weight change: 1 lb 12.2 oz (0.8 kg)  Intake/Output Summary (Last 24 hours) at 11/30/13 1011 Last data filed at 11/30/13 0500  Gross per 24 hour  Intake    873 ml  Output   1200 ml  Net   -327 ml    General: Alert, awake, oriented x3, in no acute distress Neck:  JVP is normal Heart: Regular rate and rhythm, without murmurs, rubs, gallops.  Chest  Mild macular rash   Lungs: Clear to auscultation.  No rales or wheezes. Exemities:  No edema.   Neuro: Grossly intact, nonfocal.  Tele:  SR  Lab Results: Results for orders placed during the hospital encounter of 11/26/13 (from the past 24 hour(s))  HEPARIN LEVEL (UNFRACTIONATED)     Status: None   Collection Time    11/29/13 10:20 AM      Result Value Ref Range   Heparin Unfractionated 0.57  0.30 - 0.70 IU/mL  GLUCOSE, CAPILLARY     Status: None   Collection Time    11/29/13 12:26 PM      Result Value Ref Range   Glucose-Capillary 96  70 - 99 mg/dL  GLUCOSE, CAPILLARY     Status: Abnormal   Collection Time    11/29/13  4:36 PM      Result Value Ref Range   Glucose-Capillary 103 (*) 70 - 99 mg/dL  HEPARIN LEVEL (UNFRACTIONATED)     Status: None   Collection Time    11/29/13  5:13 PM      Result Value Ref Range   Heparin Unfractionated 0.41  0.30 - 0.70 IU/mL  GLUCOSE, CAPILLARY     Status: None   Collection Time    11/29/13  9:31 PM      Result Value Ref Range   Glucose-Capillary 95  70 - 99 mg/dL  CBC     Status: Abnormal   Collection Time    11/30/13  2:32 AM      Result Value Ref Range   WBC 7.8  4.0 - 10.5 K/uL   RBC 3.33 (*) 3.87 -  5.11 MIL/uL   Hemoglobin 9.7 (*) 12.0 - 15.0 g/dL   HCT 29.3 (*) 36.0 - 46.0 %   MCV 88.0  78.0 - 100.0 fL   MCH 29.1  26.0 - 34.0 pg   MCHC 33.1  30.0 - 36.0 g/dL   RDW 14.9  11.5 - 15.5 %   Platelets 238  150 - 400 K/uL  HEPARIN LEVEL (UNFRACTIONATED)     Status: None   Collection Time    11/30/13  2:32 AM      Result Value Ref Range   Heparin Unfractionated 0.38  0.30 - 0.70 IU/mL  GLUCOSE, CAPILLARY     Status: Abnormal   Collection Time    11/30/13  7:51 AM      Result Value Ref Range   Glucose-Capillary 101 (*) 70 - 99 mg/dL  POCT I-STAT 3, ART BLOOD GAS (  G3+)     Status: Abnormal   Collection Time    11/30/13  9:53 AM      Result Value Ref Range   pH, Arterial 7.512 (*) 7.350 - 7.450   pCO2 arterial 27.8 (*) 35.0 - 45.0 mmHg   pO2, Arterial 87.0  80.0 - 100.0 mmHg   Bicarbonate 22.3  20.0 - 24.0 mEq/L   TCO2 23  0 - 100 mmol/L   O2 Saturation 98.0     Patient temperature 98.6 F     Collection site RADIAL, ALLEN'S TEST ACCEPTABLE     Drawn by RT     Sample type ARTERIAL      Studies/Results: No results found.  Medications:  Reviewed   @PROBHOSP @  1 CAD LAD 99% prox. LCx: 75 to 80% prox OM2 ostial 95% stenosis; RCA nondominant 70% ostiral narrowing then 95% mid. LVEF 35% . Plan for CABG on Monday  2. HL Statin.  3 CV disease Mild 4. Rash Still present Spoke with pharmacy  The 2 meds that hav small chance for rash are coreg and imdur  Will switch coreg to metoprolol 25 bid  This still has a chance fro rash  Will also stop imdur for now.     LOS: 4 days   Kathryn Logan 11/30/2013, 10:11 AM

## 2013-11-30 NOTE — Progress Notes (Signed)
ANTICOAGULATION CONSULT NOTE - Follow Up Consult  Pharmacy Consult for Heparin Indication: CAD awaiting CABG  No Known Allergies  Patient Measurements: Height: 5\' 2"  (157.5 cm) Weight: 147 lb 14.9 oz (67.1 kg) IBW/kg (Calculated) : 50.1 Heparin Dosing Weight: 64 kg  Vital Signs: Temp: 99.1 F (37.3 C) (07/19 0818) Temp src: Oral (07/19 0818) BP: 112/60 mmHg (07/19 0818) Pulse Rate: 99 (07/19 0818)  Labs:  Recent Labs  11/28/13 0422  11/28/13 1924 11/29/13 0326 11/29/13 1020 11/29/13 1713 11/30/13 0232 11/30/13 1000  HGB 9.9*  --   --  10.5*  --   --  9.7*  --   HCT 30.2*  --   --  32.8*  --   --  29.3*  --   PLT 214  --   --  234  --   --  238  --   APTT  --   --   --   --   --   --   --  74*  LABPROT  --   --   --   --   --   --   --  13.4  INR  --   --   --   --   --   --   --  1.02  HEPARINUNFRC  --   < >  --  0.18* 0.57 0.41 0.38  --   CREATININE  --   --  0.61  --   --   --   --  0.78  < > = values in this interval not displayed.  Estimated Creatinine Clearance: 62.1 ml/min (by C-G formula based on Cr of 0.78).   Medications:  Scheduled:  . aspirin EC  81 mg Oral Daily  . atorvastatin  80 mg Oral Daily  . bisacodyl  5 mg Oral Once  . bisacodyl  5 mg Oral Once  . Chlorhexidine Gluconate Cloth  6 each Topical Once   And  . [START ON 12/01/2013] Chlorhexidine Gluconate Cloth  6 each Topical Once  . insulin aspart  0-9 Units Subcutaneous TID WC  . [START ON 12/01/2013] metoprolol tartrate  12.5 mg Oral Once  . metoprolol tartrate  25 mg Oral BID  . polyethylene glycol  17 g Oral Daily   Infusions:  . heparin 1,000 Units/hr (11/30/13 0809)    Assessment: 68yo female with severe CAD pending CABG on Monday remains therapeutic on heparin but level continues to decline. H/H low but relatively stable, plt wnl and stable with no reported s/s bleeding.    Goal of Therapy:  Heparin level 0.3-0.7 units/ml Monitor platelets by anticoagulation protocol: Yes    Plan:  - Increase heparin slightly to 1000 units/hr to maintain in goal range - Daily HL, CBC, s/s bleeding - CABG planned for Monday  Harolyn Rutherford, PharmD Clinical Pharmacist - Resident Pager: [phone] Pharmacy: [phone] 11/30/2013 10:58 AM

## 2013-11-30 NOTE — Progress Notes (Signed)
[facility] teaching completed. Teaching includes IS use, Pain medication use, ambulation, coughing and DBE, Infection control and wound care and wound splinting

## 2013-12-01 ENCOUNTER — Encounter (HOSPITAL_COMMUNITY)
Admission: EM | Disposition: A | Discharge status: Home / Self Care | Payer: Medicare Other | Source: Home / Self Care | Attending: Cardiothoracic Surgery

## 2013-12-01 ENCOUNTER — Encounter (HOSPITAL_COMMUNITY): Payer: Self-pay | Admitting: Anesthesiology

## 2013-12-01 ENCOUNTER — Encounter (HOSPITAL_COMMUNITY): Payer: Medicare Other | Admitting: Anesthesiology

## 2013-12-01 ENCOUNTER — Inpatient Hospital Stay (HOSPITAL_COMMUNITY): Payer: Medicare Other

## 2013-12-01 ENCOUNTER — Inpatient Hospital Stay (HOSPITAL_COMMUNITY): Payer: Medicare Other | Admitting: Anesthesiology

## 2013-12-01 DIAGNOSIS — I2510 Atherosclerotic heart disease of native coronary artery without angina pectoris: Secondary | ICD-10-CM

## 2013-12-01 DIAGNOSIS — Z951 Presence of aortocoronary bypass graft: Secondary | ICD-10-CM

## 2013-12-01 HISTORY — PX: INTRAOPERATIVE TRANSESOPHAGEAL ECHOCARDIOGRAM: SHX5062

## 2013-12-01 HISTORY — PX: CORONARY ARTERY BYPASS GRAFT: SHX141

## 2013-12-01 LAB — POCT I-STAT 3, ART BLOOD GAS (G3+)
ACID-BASE DEFICIT: 5 mmol/L — AB (ref 0.0–2.0)
ACID-BASE EXCESS: 1 mmol/L (ref 0.0–2.0)
Acid-base deficit: 5 mmol/L — ABNORMAL HIGH (ref 0.0–2.0)
Acid-base deficit: 3 mmol/L — ABNORMAL HIGH (ref 0.0–2.0)
Acid-base deficit: 3 mmol/L — ABNORMAL HIGH (ref 0.0–2.0)
BICARBONATE: 20.5 meq/L (ref 20.0–24.0)
BICARBONATE: 21.1 meq/L (ref 20.0–24.0)
BICARBONATE: 21.7 meq/L (ref 20.0–24.0)
Bicarbonate: 25.3 mEq/L — ABNORMAL HIGH (ref 20.0–24.0)
Bicarbonate: 22.3 mEq/L (ref 20.0–24.0)
O2 SAT: 100 %
O2 SAT: 94 %
O2 SAT: 97 %
O2 Saturation: 100 %
O2 Saturation: 96 %
PCO2 ART: 39.7 mmHg (ref 35.0–45.0)
PCO2 ART: 38.1 mmHg (ref 35.0–45.0)
PH ART: 7.342 — AB (ref 7.350–7.450)
PO2 ART: 228 mmHg — AB (ref 80.0–100.0)
PO2 ART: 77 mmHg — AB (ref 80.0–100.0)
PO2 ART: 68 mmHg — AB (ref 80.0–100.0)
Patient temperature: 35.2
Patient temperature: 36.6
TCO2: 27 mmol/L (ref 0–100)
TCO2: 22 mmol/L (ref 0–100)
TCO2: 22 mmol/L (ref 0–100)
TCO2: 23 mmol/L (ref 0–100)
TCO2: 23 mmol/L (ref 0–100)
pCO2 arterial: 39.2 mmHg (ref 35.0–45.0)
pCO2 arterial: 34.7 mmHg — ABNORMAL LOW (ref 35.0–45.0)
pCO2 arterial: 37.5 mmHg (ref 35.0–45.0)
pH, Arterial: 7.418 (ref 7.350–7.450)
pH, Arterial: 7.321 — ABNORMAL LOW (ref 7.350–7.450)
pH, Arterial: 7.403 (ref 7.350–7.450)
pH, Arterial: 7.381 (ref 7.350–7.450)
pO2, Arterial: 369 mmHg — ABNORMAL HIGH (ref 80.0–100.0)
pO2, Arterial: 90 mmHg (ref 80.0–100.0)

## 2013-12-01 LAB — POCT I-STAT, CHEM 8
BUN: 12 mg/dL (ref 6–23)
BUN: 11 mg/dL (ref 6–23)
BUN: 12 mg/dL (ref 6–23)
BUN: 11 mg/dL (ref 6–23)
BUN: 10 mg/dL (ref 6–23)
BUN: 9 mg/dL (ref 6–23)
CALCIUM ION: 1.21 mmol/L (ref 1.13–1.30)
CALCIUM ION: 1.23 mmol/L (ref 1.13–1.30)
CALCIUM ION: 1.05 mmol/L — AB (ref 1.13–1.30)
CHLORIDE: 108 meq/L (ref 96–112)
CHLORIDE: 106 meq/L (ref 96–112)
CREATININE: 0.5 mg/dL (ref 0.50–1.10)
CREATININE: 0.5 mg/dL (ref 0.50–1.10)
Calcium, Ion: 1.1 mmol/L — ABNORMAL LOW (ref 1.13–1.30)
Calcium, Ion: 1.03 mmol/L — ABNORMAL LOW (ref 1.13–1.30)
Calcium, Ion: 1.16 mmol/L (ref 1.13–1.30)
Chloride: 106 mEq/L (ref 96–112)
Chloride: 98 mEq/L (ref 96–112)
Chloride: 104 mEq/L (ref 96–112)
Chloride: 107 mEq/L (ref 96–112)
Creatinine, Ser: 0.4 mg/dL — ABNORMAL LOW (ref 0.50–1.10)
Creatinine, Ser: 0.4 mg/dL — ABNORMAL LOW (ref 0.50–1.10)
Creatinine, Ser: 0.4 mg/dL — ABNORMAL LOW (ref 0.50–1.10)
Creatinine, Ser: 0.4 mg/dL — ABNORMAL LOW (ref 0.50–1.10)
GLUCOSE: 106 mg/dL — AB (ref 70–99)
GLUCOSE: 107 mg/dL — AB (ref 70–99)
Glucose, Bld: 123 mg/dL — ABNORMAL HIGH (ref 70–99)
Glucose, Bld: 148 mg/dL — ABNORMAL HIGH (ref 70–99)
Glucose, Bld: 198 mg/dL — ABNORMAL HIGH (ref 70–99)
Glucose, Bld: 99 mg/dL (ref 70–99)
HCT: 21 % — ABNORMAL LOW (ref 36.0–46.0)
HCT: 29 % — ABNORMAL LOW (ref 36.0–46.0)
HEMATOCRIT: 28 % — AB (ref 36.0–46.0)
HEMATOCRIT: 24 % — AB (ref 36.0–46.0)
HEMATOCRIT: 23 % — AB (ref 36.0–46.0)
HEMATOCRIT: 28 % — AB (ref 36.0–46.0)
HEMOGLOBIN: 7.1 g/dL — AB (ref 12.0–15.0)
HEMOGLOBIN: 9.9 g/dL — AB (ref 12.0–15.0)
HEMOGLOBIN: 9.5 g/dL — AB (ref 12.0–15.0)
Hemoglobin: 9.5 g/dL — ABNORMAL LOW (ref 12.0–15.0)
Hemoglobin: 8.2 g/dL — ABNORMAL LOW (ref 12.0–15.0)
Hemoglobin: 7.8 g/dL — ABNORMAL LOW (ref 12.0–15.0)
POTASSIUM: 5.7 meq/L — AB (ref 3.7–5.3)
POTASSIUM: 4.4 meq/L (ref 3.7–5.3)
Potassium: 4.3 mEq/L (ref 3.7–5.3)
Potassium: 3.5 mEq/L — ABNORMAL LOW (ref 3.7–5.3)
Potassium: 4.3 mEq/L (ref 3.7–5.3)
Potassium: 4.1 mEq/L (ref 3.7–5.3)
SODIUM: 137 meq/L (ref 137–147)
SODIUM: 135 meq/L — AB (ref 137–147)
Sodium: 140 mEq/L (ref 137–147)
Sodium: 139 mEq/L (ref 137–147)
Sodium: 140 mEq/L (ref 137–147)
Sodium: 139 mEq/L (ref 137–147)
TCO2: 23 mmol/L (ref 0–100)
TCO2: 22 mmol/L (ref 0–100)
TCO2: 24 mmol/L (ref 0–100)
TCO2: 22 mmol/L (ref 0–100)
TCO2: 20 mmol/L (ref 0–100)
TCO2: 21 mmol/L (ref 0–100)

## 2013-12-01 LAB — CBC
HCT: 30.6 % — ABNORMAL LOW (ref 36.0–46.0)
HCT: 32.2 % — ABNORMAL LOW (ref 36.0–46.0)
HCT: 30.7 % — ABNORMAL LOW (ref 36.0–46.0)
HEMOGLOBIN: 10.8 g/dL — AB (ref 12.0–15.0)
Hemoglobin: 10 g/dL — ABNORMAL LOW (ref 12.0–15.0)
Hemoglobin: 10.5 g/dL — ABNORMAL LOW (ref 12.0–15.0)
MCH: 28.7 pg (ref 26.0–34.0)
MCH: 29.3 pg (ref 26.0–34.0)
MCH: 29.3 pg (ref 26.0–34.0)
MCHC: 32.7 g/dL (ref 30.0–36.0)
MCHC: 33.5 g/dL (ref 30.0–36.0)
MCHC: 34.2 g/dL (ref 30.0–36.0)
MCV: 87.7 fL (ref 78.0–100.0)
MCV: 87.3 fL (ref 78.0–100.0)
MCV: 85.8 fL (ref 78.0–100.0)
PLATELETS: 237 10*3/uL (ref 150–400)
Platelets: 87 10*3/uL — ABNORMAL LOW (ref 150–400)
Platelets: 100 10*3/uL — ABNORMAL LOW (ref 150–400)
RBC: 3.49 MIL/uL — ABNORMAL LOW (ref 3.87–5.11)
RBC: 3.69 MIL/uL — AB (ref 3.87–5.11)
RBC: 3.58 MIL/uL — ABNORMAL LOW (ref 3.87–5.11)
RDW: 14.9 % (ref 11.5–15.5)
RDW: 14.7 % (ref 11.5–15.5)
RDW: 14.4 % (ref 11.5–15.5)
WBC: 7.7 10*3/uL (ref 4.0–10.5)
WBC: 7.7 10*3/uL (ref 4.0–10.5)
WBC: 9 10*3/uL (ref 4.0–10.5)

## 2013-12-01 LAB — APTT: APTT: 35 s (ref 24–37)

## 2013-12-01 LAB — POCT I-STAT 4, (NA,K, GLUC, HGB,HCT)
GLUCOSE: 149 mg/dL — AB (ref 70–99)
HCT: 32 % — ABNORMAL LOW (ref 36.0–46.0)
HEMOGLOBIN: 10.9 g/dL — AB (ref 12.0–15.0)
POTASSIUM: 3.5 meq/L — AB (ref 3.7–5.3)
SODIUM: 142 meq/L (ref 137–147)

## 2013-12-01 LAB — CREATININE, SERUM
Creatinine, Ser: 0.52 mg/dL (ref 0.50–1.10)
GFR calc Af Amer: >90 mL/min (ref >90)
GFR calc non Af Amer: >90 mL/min (ref >90)

## 2013-12-01 LAB — MAGNESIUM: Magnesium: 3.2 mg/dL — ABNORMAL HIGH (ref 1.5–2.5)

## 2013-12-01 LAB — PROTIME-INR
INR: 1.38 (ref 0.00–1.49)
Prothrombin Time: 17 seconds — ABNORMAL HIGH (ref 11.6–15.2)

## 2013-12-01 LAB — GLUCOSE, CAPILLARY: GLUCOSE-CAPILLARY: 104 mg/dL — AB (ref 70–99)

## 2013-12-01 LAB — HEMOGLOBIN AND HEMATOCRIT, BLOOD
HCT: 27.2 % — ABNORMAL LOW (ref 36.0–46.0)
Hemoglobin: 9.3 g/dL — ABNORMAL LOW (ref 12.0–15.0)

## 2013-12-01 LAB — PLATELET COUNT: Platelets: 114 10*3/uL — ABNORMAL LOW (ref 150–400)

## 2013-12-01 LAB — HEPARIN LEVEL (UNFRACTIONATED): Heparin Unfractionated: 0.4 IU/mL (ref 0.30–0.70)

## 2013-12-01 SURGERY — CORONARY ARTERY BYPASS GRAFTING (CABG)
Anesthesia: General | Site: Chest

## 2013-12-01 MED ORDER — PROTAMINE SULFATE 10 MG/ML IV SOLN
INTRAVENOUS | Status: AC
  Filled 2013-12-01: qty 25

## 2013-12-01 MED ORDER — ACETAMINOPHEN 160 MG/5ML PO SOLN: 1000.0000 mg | Freq: Four times a day (QID) | ORAL | Status: DC

## 2013-12-01 MED ORDER — LIDOCAINE HCL (CARDIAC) 20 MG/ML IV SOLN
INTRAVENOUS | Status: AC
  Filled 2013-12-01: qty 5

## 2013-12-01 MED ORDER — POTASSIUM CHLORIDE 10 MEQ/50ML IV SOLN
10.0000 meq | INTRAVENOUS | Status: AC
  Administered 2013-12-01 (×3): 10 meq via INTRAVENOUS

## 2013-12-01 MED ORDER — ALBUMIN HUMAN 5 % IV SOLN
250.0000 mL | INTRAVENOUS | Status: AC | PRN
  Administered 2013-12-01 – 2013-12-02 (×3): 250 mL via INTRAVENOUS
  Filled 2013-12-01: qty 250

## 2013-12-01 MED ORDER — MILRINONE IN DEXTROSE 20 MG/100ML IV SOLN: 0.3330 ug/kg/min | INTRAVENOUS | Status: DC

## 2013-12-01 MED ORDER — ALBUMIN HUMAN 5 % IV SOLN
INTRAVENOUS | Status: DC | PRN
  Administered 2013-12-01 (×2): via INTRAVENOUS

## 2013-12-01 MED ORDER — PHENYLEPHRINE HCL 10 MG/ML IJ SOLN
0.0000 ug/min | INTRAVENOUS | Status: DC
  Filled 2013-12-01: qty 2

## 2013-12-01 MED ORDER — ACETAMINOPHEN 650 MG RE SUPP
650.0000 mg | Freq: Once | RECTAL | Status: AC
  Administered 2013-12-01: 650 mg via RECTAL

## 2013-12-01 MED ORDER — SODIUM CHLORIDE 0.9 % IJ SOLN
INTRAMUSCULAR | Status: DC | PRN
  Administered 2013-12-01 (×3): via TOPICAL

## 2013-12-01 MED ORDER — MIDAZOLAM HCL 10 MG/2ML IJ SOLN
INTRAMUSCULAR | Status: AC
  Filled 2013-12-01: qty 2

## 2013-12-01 MED ORDER — LACTATED RINGERS IV SOLN: INTRAVENOUS | Status: DC

## 2013-12-01 MED ORDER — FENTANYL CITRATE 0.05 MG/ML IJ SOLN
INTRAMUSCULAR | Status: DC | PRN
Start: 2013-12-01 — End: 2013-12-01
  Administered 2013-12-01: 100 ug via INTRAVENOUS
  Administered 2013-12-01: 150 ug via INTRAVENOUS
  Administered 2013-12-01: 675 ug via INTRAVENOUS
  Administered 2013-12-01: 50 ug via INTRAVENOUS
  Administered 2013-12-01: 150 ug via INTRAVENOUS
  Administered 2013-12-01: 100 ug via INTRAVENOUS
  Administered 2013-12-01: 25 ug via INTRAVENOUS

## 2013-12-01 MED ORDER — MILRINONE IN DEXTROSE 20 MG/100ML IV SOLN
0.3750 ug/kg/min | INTRAVENOUS | Status: DC
  Administered 2013-12-02: 0.333 ug/kg/min via INTRAVENOUS
  Filled 2013-12-01: qty 100

## 2013-12-01 MED ORDER — HEPARIN SODIUM (PORCINE) 1000 UNIT/ML IJ SOLN
INTRAMUSCULAR | Status: AC
  Filled 2013-12-01: qty 3

## 2013-12-01 MED ORDER — METOCLOPRAMIDE HCL 5 MG/ML IJ SOLN
10.0000 mg | Freq: Four times a day (QID) | INTRAMUSCULAR | Status: AC
  Administered 2013-12-01 – 2013-12-02 (×4): 10 mg via INTRAVENOUS
  Filled 2013-12-01 (×3): qty 2

## 2013-12-01 MED ORDER — PHENYLEPHRINE 40 MCG/ML (10ML) SYRINGE FOR IV PUSH (FOR BLOOD PRESSURE SUPPORT)
PREFILLED_SYRINGE | INTRAVENOUS | Status: AC
  Filled 2013-12-01: qty 10

## 2013-12-01 MED ORDER — PANTOPRAZOLE SODIUM 40 MG PO TBEC: 40.0000 mg | DELAYED_RELEASE_TABLET | Freq: Every day | ORAL | Status: DC

## 2013-12-01 MED ORDER — ROCURONIUM BROMIDE 50 MG/5ML IV SOLN
INTRAVENOUS | Status: AC
  Filled 2013-12-01: qty 2

## 2013-12-01 MED ORDER — MAGNESIUM SULFATE 4000MG/100ML IJ SOLN
4.0000 g | Freq: Once | INTRAMUSCULAR | Status: AC
  Administered 2013-12-01: 4 g via INTRAVENOUS
  Filled 2013-12-01: qty 100

## 2013-12-01 MED ORDER — BISACODYL 10 MG RE SUPP: 10.0000 mg | Freq: Every day | RECTAL | Status: DC

## 2013-12-01 MED ORDER — HEPARIN SODIUM (PORCINE) 1000 UNIT/ML IJ SOLN
INTRAMUSCULAR | Status: DC | PRN
  Administered 2013-12-01: 35000 [IU] via INTRAVENOUS

## 2013-12-01 MED ORDER — SODIUM CHLORIDE 0.9 % IV SOLN
INTRAVENOUS | Status: DC
  Administered 2013-12-01: 21:00:00 via INTRAVENOUS
  Filled 2013-12-01: qty 1

## 2013-12-01 MED ORDER — DOPAMINE-DEXTROSE 3.2-5 MG/ML-% IV SOLN: 2.0000 ug/kg/min | INTRAVENOUS | Status: DC

## 2013-12-01 MED ORDER — LACTATED RINGERS IV SOLN: 500.0000 mL | Freq: Once | INTRAVENOUS | Status: AC | PRN

## 2013-12-01 MED ORDER — PROPOFOL 10 MG/ML IV BOLUS
INTRAVENOUS | Status: DC | PRN
  Administered 2013-12-01: 20 mg via INTRAVENOUS

## 2013-12-01 MED ORDER — ROCURONIUM BROMIDE 100 MG/10ML IV SOLN
INTRAVENOUS | Status: DC | PRN
  Administered 2013-12-01: 50 mg via INTRAVENOUS
  Administered 2013-12-01: 20 mg via INTRAVENOUS
  Administered 2013-12-01: 30 mg via INTRAVENOUS

## 2013-12-01 MED ORDER — NITROGLYCERIN IN D5W 200-5 MCG/ML-% IV SOLN: 0.0000 ug/min | INTRAVENOUS | Status: DC

## 2013-12-01 MED ORDER — HEPARIN SODIUM (PORCINE) 1000 UNIT/ML IJ SOLN
INTRAMUSCULAR | Status: AC
  Filled 2013-12-01: qty 1

## 2013-12-01 MED ORDER — DEXTROSE 5 % IV SOLN
1.5000 g | Freq: Two times a day (BID) | INTRAVENOUS | Status: AC
  Administered 2013-12-01 – 2013-12-03 (×4): 1.5 g via INTRAVENOUS
  Filled 2013-12-01 (×4): qty 1.5

## 2013-12-01 MED ORDER — MORPHINE SULFATE 2 MG/ML IJ SOLN
1.0000 mg | INTRAMUSCULAR | Status: AC | PRN
  Administered 2013-12-01 – 2013-12-02 (×3): 2 mg via INTRAVENOUS
  Filled 2013-12-01 (×2): qty 1
  Filled 2013-12-01: qty 2
  Filled 2013-12-01: qty 1

## 2013-12-01 MED ORDER — LACTATED RINGERS IV SOLN
INTRAVENOUS | Status: DC | PRN
  Administered 2013-12-01: 08:00:00 via INTRAVENOUS

## 2013-12-01 MED ORDER — DOCUSATE SODIUM 100 MG PO CAPS
200.0000 mg | ORAL_CAPSULE | Freq: Every day | ORAL | Status: DC
  Administered 2013-12-02: 200 mg via ORAL
  Filled 2013-12-01: qty 2

## 2013-12-01 MED ORDER — MORPHINE SULFATE 2 MG/ML IJ SOLN
2.0000 mg | INTRAMUSCULAR | Status: DC | PRN
  Administered 2013-12-01: 2 mg via INTRAVENOUS
  Administered 2013-12-02: 4 mg via INTRAVENOUS
  Administered 2013-12-02 (×3): 2 mg via INTRAVENOUS
  Filled 2013-12-01: qty 1
  Filled 2013-12-01: qty 2
  Filled 2013-12-01: qty 1

## 2013-12-01 MED ORDER — SODIUM CHLORIDE 0.9 % IV SOLN
10.0000 mg | INTRAVENOUS | Status: DC | PRN
  Administered 2013-12-01: 10 ug/min via INTRAVENOUS

## 2013-12-01 MED ORDER — HEMOSTATIC AGENTS (NO CHARGE) OPTIME
TOPICAL | Status: DC | PRN
  Administered 2013-12-01: 1 via TOPICAL

## 2013-12-01 MED ORDER — FENTANYL CITRATE 0.05 MG/ML IJ SOLN
INTRAMUSCULAR | Status: AC
  Filled 2013-12-01: qty 5

## 2013-12-01 MED ORDER — ASPIRIN EC 325 MG PO TBEC
325.0000 mg | DELAYED_RELEASE_TABLET | Freq: Every day | ORAL | Status: DC
  Administered 2013-12-02: 325 mg via ORAL
  Filled 2013-12-01 (×2): qty 1

## 2013-12-01 MED ORDER — MIDAZOLAM HCL 2 MG/2ML IJ SOLN: 2.0000 mg | INTRAMUSCULAR | Status: DC | PRN

## 2013-12-01 MED ORDER — ONDANSETRON HCL 4 MG/2ML IJ SOLN
4.0000 mg | Freq: Four times a day (QID) | INTRAMUSCULAR | Status: DC | PRN
  Administered 2013-12-01: 4 mg via INTRAVENOUS
  Filled 2013-12-01: qty 2

## 2013-12-01 MED ORDER — ASPIRIN 81 MG PO CHEW: 324.0000 mg | CHEWABLE_TABLET | Freq: Every day | ORAL | Status: DC

## 2013-12-01 MED ORDER — SODIUM CHLORIDE 0.9 % IJ SOLN
3.0000 mL | Freq: Two times a day (BID) | INTRAMUSCULAR | Status: DC
  Administered 2013-12-02 (×2): 3 mL via INTRAVENOUS

## 2013-12-01 MED ORDER — OXYCODONE HCL 5 MG PO TABS
5.0000 mg | ORAL_TABLET | ORAL | Status: DC | PRN
  Administered 2013-12-02 (×2): 10 mg via ORAL
  Administered 2013-12-02: 5 mg via ORAL
  Administered 2013-12-02: 10 mg via ORAL
  Filled 2013-12-01: qty 2
  Filled 2013-12-01: qty 1
  Filled 2013-12-01 (×2): qty 2

## 2013-12-01 MED ORDER — SODIUM BICARBONATE 8.4 % IV SOLN
25.0000 meq | Freq: Once | INTRAVENOUS | Status: AC
  Administered 2013-12-01: 25 meq via INTRAVENOUS

## 2013-12-01 MED ORDER — VANCOMYCIN HCL IN DEXTROSE 1-5 GM/200ML-% IV SOLN
1000.0000 mg | Freq: Once | INTRAVENOUS | Status: AC
  Administered 2013-12-01: 1000 mg via INTRAVENOUS
  Filled 2013-12-01: qty 200

## 2013-12-01 MED ORDER — PROPOFOL 10 MG/ML IV BOLUS
INTRAVENOUS | Status: AC
  Filled 2013-12-01: qty 20

## 2013-12-01 MED ORDER — INSULIN REGULAR BOLUS VIA INFUSION
0.0000 [IU] | Freq: Three times a day (TID) | INTRAVENOUS | Status: DC
  Administered 2013-12-02: 2 [IU] via INTRAVENOUS
  Administered 2013-12-02: 3 [IU] via INTRAVENOUS
  Filled 2013-12-01: qty 10

## 2013-12-01 MED ORDER — PROTAMINE SULFATE 10 MG/ML IV SOLN
INTRAVENOUS | Status: AC
  Filled 2013-12-01: qty 5

## 2013-12-01 MED ORDER — FAMOTIDINE IN NACL 20-0.9 MG/50ML-% IV SOLN
20.0000 mg | Freq: Two times a day (BID) | INTRAVENOUS | Status: AC
  Administered 2013-12-01: 20 mg via INTRAVENOUS

## 2013-12-01 MED ORDER — SODIUM CHLORIDE 0.9 % IV SOLN
INTRAVENOUS | Status: DC
  Administered 2013-12-01: 20 mL/h via INTRAVENOUS

## 2013-12-01 MED ORDER — SODIUM CHLORIDE 0.9 % IJ SOLN
INTRAMUSCULAR | Status: AC
  Filled 2013-12-01: qty 20

## 2013-12-01 MED ORDER — SODIUM CHLORIDE 0.45 % IV SOLN
INTRAVENOUS | Status: DC
  Administered 2013-12-01: 20 mL/h via INTRAVENOUS

## 2013-12-01 MED ORDER — METOPROLOL TARTRATE 25 MG/10 ML ORAL SUSPENSION
12.5000 mg | Freq: Two times a day (BID) | ORAL | Status: DC
  Filled 2013-12-01 (×5): qty 5

## 2013-12-01 MED ORDER — MIDAZOLAM HCL 5 MG/5ML IJ SOLN
INTRAMUSCULAR | Status: DC | PRN
  Administered 2013-12-01: 4 mg via INTRAVENOUS
  Administered 2013-12-01: 1 mg via INTRAVENOUS
  Administered 2013-12-01: 2 mg via INTRAVENOUS
  Administered 2013-12-01: 1 mg via INTRAVENOUS
  Administered 2013-12-01: 2 mg via INTRAVENOUS

## 2013-12-01 MED ORDER — MILRINONE IN DEXTROSE 20 MG/100ML IV SOLN
0.1250 ug/kg/min | INTRAVENOUS | Status: AC
  Administered 2013-12-01: .33 ug/kg/min via INTRAVENOUS
  Filled 2013-12-01 (×2): qty 100

## 2013-12-01 MED ORDER — BISACODYL 5 MG PO TBEC
10.0000 mg | DELAYED_RELEASE_TABLET | Freq: Every day | ORAL | Status: DC
Start: 2013-12-02 — End: 2013-12-03
  Administered 2013-12-02: 10 mg via ORAL
  Filled 2013-12-01: qty 2

## 2013-12-01 MED ORDER — ACETAMINOPHEN 500 MG PO TABS
1000.0000 mg | ORAL_TABLET | Freq: Four times a day (QID) | ORAL | Status: DC
  Administered 2013-12-02 – 2013-12-03 (×5): 1000 mg via ORAL
  Filled 2013-12-01 (×10): qty 2

## 2013-12-01 MED ORDER — SUCCINYLCHOLINE CHLORIDE 20 MG/ML IJ SOLN
INTRAMUSCULAR | Status: AC
  Filled 2013-12-01: qty 1

## 2013-12-01 MED ORDER — PROTAMINE SULFATE 10 MG/ML IV SOLN
INTRAVENOUS | Status: DC | PRN
  Administered 2013-12-01 (×4): 30 mg via INTRAVENOUS
  Administered 2013-12-01: 10 mg via INTRAVENOUS
  Administered 2013-12-01 (×4): 30 mg via INTRAVENOUS

## 2013-12-01 MED ORDER — METOPROLOL TARTRATE 1 MG/ML IV SOLN: 2.5000 mg | INTRAVENOUS | Status: DC | PRN

## 2013-12-01 MED ORDER — ARTIFICIAL TEARS OP OINT
TOPICAL_OINTMENT | OPHTHALMIC | Status: AC
  Filled 2013-12-01: qty 3.5

## 2013-12-01 MED ORDER — LIDOCAINE HCL (CARDIAC) 20 MG/ML IV SOLN
INTRAVENOUS | Status: DC | PRN
  Administered 2013-12-01: 20 mg via INTRAVENOUS

## 2013-12-01 MED ORDER — SODIUM CHLORIDE 0.9 % IV SOLN
INTRAVENOUS | Status: DC | PRN
  Administered 2013-12-01: 14:00:00 via INTRAVENOUS

## 2013-12-01 MED ORDER — SODIUM CHLORIDE 0.9 % IJ SOLN: 3.0000 mL | INTRAMUSCULAR | Status: DC | PRN

## 2013-12-01 MED ORDER — 0.9 % SODIUM CHLORIDE (POUR BTL) OPTIME
TOPICAL | Status: DC | PRN
  Administered 2013-12-01: 6000 mL

## 2013-12-01 MED ORDER — ACETAMINOPHEN 160 MG/5ML PO SOLN: 650.0000 mg | Freq: Once | ORAL | Status: AC

## 2013-12-01 MED ORDER — METOPROLOL TARTRATE 12.5 MG HALF TABLET
12.5000 mg | ORAL_TABLET | Freq: Two times a day (BID) | ORAL | Status: DC
  Administered 2013-12-02 (×2): 12.5 mg via ORAL
  Filled 2013-12-01 (×5): qty 1

## 2013-12-01 MED ORDER — DEXMEDETOMIDINE HCL IN NACL 200 MCG/50ML IV SOLN: 0.1000 ug/kg/h | INTRAVENOUS | Status: DC

## 2013-12-01 MED ORDER — SODIUM CHLORIDE 0.9 % IV SOLN: 250.0000 mL | INTRAVENOUS | Status: DC

## 2013-12-01 MED ORDER — EPHEDRINE SULFATE 50 MG/ML IJ SOLN
INTRAMUSCULAR | Status: AC
  Filled 2013-12-01: qty 1

## 2013-12-01 SURGICAL SUPPLY — 72 items
ATTRACTOMAT 16X20 MAGNETIC DRP (DRAPES) ×3 IMPLANT
BAG DECANTER FOR FLEXI CONT (MISCELLANEOUS) ×3 IMPLANT
BANDAGE ELASTIC 4 VELCRO ST LF (GAUZE/BANDAGES/DRESSINGS) ×3 IMPLANT
BANDAGE ELASTIC 6 VELCRO ST LF (GAUZE/BANDAGES/DRESSINGS) ×3 IMPLANT
BANDAGE GAUZE ELAST BULKY 4 IN (GAUZE/BANDAGES/DRESSINGS) ×3 IMPLANT
BLADE STERNUM SYSTEM 6 (BLADE) ×3 IMPLANT
BNDG GAUZE ELAST 4 BULKY (GAUZE/BANDAGES/DRESSINGS) ×1 IMPLANT
CANISTER SUCTION 2500CC (MISCELLANEOUS) ×3 IMPLANT
CANNULA VEN 2 STAGE (MISCELLANEOUS) ×1 IMPLANT
CARDIAC SUCTION (MISCELLANEOUS) ×3 IMPLANT
CATH CPB KIT GERHARDT (MISCELLANEOUS) ×3 IMPLANT
CATH THORACIC 28FR (CATHETERS) ×3 IMPLANT
COVER SURGICAL LIGHT HANDLE (MISCELLANEOUS) ×3 IMPLANT
CRADLE DONUT ADULT HEAD (MISCELLANEOUS) ×3 IMPLANT
DRAIN CHANNEL 28F RND 3/8 FF (WOUND CARE) ×3 IMPLANT
DRAPE CARDIOVASCULAR INCISE (DRAPES) ×3
DRAPE SLUSH/WARMER DISC (DRAPES) ×3 IMPLANT
DRAPE SRG 135X102X78XABS (DRAPES) ×2 IMPLANT
DRSG AQUACEL AG ADV 3.5X14 (GAUZE/BANDAGES/DRESSINGS) ×3 IMPLANT
ELECT BLADE 4.0 EZ CLEAN MEGAD (MISCELLANEOUS) ×3
ELECT REM PT RETURN 9FT ADLT (ELECTROSURGICAL) ×6
ELECTRODE BLDE 4.0 EZ CLN MEGD (MISCELLANEOUS) ×2 IMPLANT
ELECTRODE REM PT RTRN 9FT ADLT (ELECTROSURGICAL) ×4 IMPLANT
GLOVE BIO SURGEON STRL SZ 6 (GLOVE) ×3 IMPLANT
GLOVE BIO SURGEON STRL SZ 6.5 (GLOVE) ×15 IMPLANT
GLOVE BIOGEL PI IND STRL 6 (GLOVE) IMPLANT
GLOVE BIOGEL PI IND STRL 6.5 (GLOVE) IMPLANT
GLOVE BIOGEL PI INDICATOR 6 (GLOVE) ×1
GLOVE BIOGEL PI INDICATOR 6.5 (GLOVE) ×1
GOWN STRL REUS W/ TWL LRG LVL3 (GOWN DISPOSABLE) ×8 IMPLANT
GOWN STRL REUS W/TWL LRG LVL3 (GOWN DISPOSABLE) ×18
HEMOSTAT POWDER SURGIFOAM 1G (HEMOSTASIS) ×9 IMPLANT
HEMOSTAT SURGICEL 2X14 (HEMOSTASIS) ×3 IMPLANT
KIT BASIN OR (CUSTOM PROCEDURE TRAY) ×3 IMPLANT
KIT ROOM TURNOVER OR (KITS) ×3 IMPLANT
KIT SUCTION CATH 14FR (SUCTIONS) ×6 IMPLANT
KIT VASOVIEW W/TROCAR VH 2000 (KITS) ×3 IMPLANT
LEAD PACING MYOCARDI (MISCELLANEOUS) ×3 IMPLANT
MARKER GRAFT CORONARY BYPASS (MISCELLANEOUS) ×9 IMPLANT
NS IRRIG 1000ML POUR BTL (IV SOLUTION) ×16 IMPLANT
PACK OPEN HEART (CUSTOM PROCEDURE TRAY) ×3 IMPLANT
PAD ARMBOARD 7.5X6 YLW CONV (MISCELLANEOUS) ×6 IMPLANT
PAD ELECT DEFIB RADIOL ZOLL (MISCELLANEOUS) ×3 IMPLANT
PENCIL BUTTON HOLSTER BLD 10FT (ELECTRODE) ×3 IMPLANT
PUNCH AORTIC ROT 4.0MM RCL 40 (MISCELLANEOUS) ×1 IMPLANT
SET CARDIOPLEGIA MPS 5001102 (MISCELLANEOUS) ×1 IMPLANT
SPONGE GAUZE 4X4 12PLY (GAUZE/BANDAGES/DRESSINGS) ×6 IMPLANT
SPONGE GAUZE 4X4 12PLY STER LF (GAUZE/BANDAGES/DRESSINGS) ×2 IMPLANT
SPONGE LAP 18X18 X RAY DECT (DISPOSABLE) ×2 IMPLANT
SUT BONE WAX W31G (SUTURE) ×3 IMPLANT
SUT PROLENE 3 0 SH1 36 (SUTURE) ×3 IMPLANT
SUT PROLENE 4 0 TF (SUTURE) ×7 IMPLANT
SUT PROLENE 6 0 C 1 30 (SUTURE) ×3 IMPLANT
SUT PROLENE 6 0 CC (SUTURE) ×7 IMPLANT
SUT PROLENE 7 0 BV1 MDA (SUTURE) ×4 IMPLANT
SUT PROLENE 8 0 BV175 6 (SUTURE) ×4 IMPLANT
SUT SILK  1 MH (SUTURE) ×1
SUT SILK 1 MH (SUTURE) IMPLANT
SUT STEEL 6MS V (SUTURE) ×3 IMPLANT
SUT STEEL SZ 6 DBL 3X14 BALL (SUTURE) ×3 IMPLANT
SUT VIC AB 1 CTX 18 (SUTURE) ×6 IMPLANT
SUTURE E-PAK OPEN HEART (SUTURE) ×3 IMPLANT
SYSTEM SAHARA CHEST DRAIN ATS (WOUND CARE) ×3 IMPLANT
TAPE CLOTH SURG 4X10 WHT LF (GAUZE/BANDAGES/DRESSINGS) ×1 IMPLANT
TAPE PAPER 2X10 WHT MICROPORE (GAUZE/BANDAGES/DRESSINGS) ×1 IMPLANT
TOWEL OR 17X24 6PK STRL BLUE (TOWEL DISPOSABLE) ×6 IMPLANT
TOWEL OR 17X26 10 PK STRL BLUE (TOWEL DISPOSABLE) ×6 IMPLANT
TRAY FOLEY IC TEMP SENS 16FR (CATHETERS) ×3 IMPLANT
TUBE FEEDING 8FR 16IN STR KANG (MISCELLANEOUS) ×3 IMPLANT
TUBING INSUFFLATION 10FT LAP (TUBING) ×3 IMPLANT
UNDERPAD 30X30 INCONTINENT (UNDERPADS AND DIAPERS) ×3 IMPLANT
WATER STERILE IRR 1000ML POUR (IV SOLUTION) ×6 IMPLANT

## 2013-12-01 NOTE — Progress Notes (Signed)
Pt transported to the operating room in stable condition and family by her side. Report given to CRNA.

## 2013-12-01 NOTE — Transfer of Care (Signed)
Immediate Anesthesia Transfer of Care Note  Patient: Kathryn Logan  Procedure(s) Performed: Procedure(s): CORONARY ARTERY BYPASS GRAFTING (CABG) x three, using left internal mammary artery, and right leg saphenous vein harvested endoscopically (LIMA to LAD, SVG to OM2 , SVG to dRCA) (N/A) INTRAOPERATIVE TRANSESOPHAGEAL ECHOCARDIOGRAM (N/A)  Patient Location: SICU  Anesthesia Type:General  Level of Consciousness: sedated and Patient remains intubated per anesthesia plan  Airway & Oxygen Therapy: Patient remains intubated per anesthesia plan and Patient placed on Ventilator (see vital sign flow sheet for setting)  Post-op Assessment: Report given to PACU RN and Post -op Vital signs reviewed and stable  Post vital signs: Reviewed and stable  Complications: No apparent anesthesia complications

## 2013-12-01 NOTE — Procedures (Signed)
Extubation Procedure Note  Patient Details:   Name: STEPHENY CANAL DOB: 05/18/1945 MRN: [id number]   Airway Documentation:  Airway 8 mm (Active)  Secured at (cm) 21 cm 12/01/2013  8:32 PM  Measured From Lips 12/01/2013  8:32 PM  Secured Location Right 12/01/2013  8:32 PM  Secured By Pink Tape 12/01/2013  8:32 PM  Site Condition Dry 12/01/2013  8:32 PM    Evaluation  O2 sats: stable throughout Complications: No apparent complications Patient did tolerate procedure well. Bilateral Breath Sounds: Clear;Diminished   Yes, patient vocalized name after tube was pulled. Patient was able to cough up secretions and is on a nasal cannula of 6 liters with SATS of 93% Patient had a NIF of -40, procedure was done 3 times and consistently was above -30. Patient had a  Vital capacity of 1.6 procedure was done 3 times as well. RT will continue to monitor throughout the night.  Virgilio Frees 12/01/2013, 9:17 PM

## 2013-12-01 NOTE — Progress Notes (Signed)
Patient ID: Kathryn Logan, female   DOB: [birth year], 68 y.o.   MRN: [id number] EVENING ROUNDS NOTE :     [address]       [place] [address]             [phone]                 Day of Surgery Procedure(s) (LRB): CORONARY ARTERY BYPASS GRAFTING (CABG) x three, using left internal mammary artery, and right leg saphenous vein harvested endoscopically (LIMA to LAD, SVG to OM2 , SVG to dRCA) (N/A) INTRAOPERATIVE TRANSESOPHAGEAL ECHOCARDIOGRAM (N/A)  Total Length of Stay:  LOS: 5 days  BP 113/57  Pulse 104  Temp(Src) 96.1 F (35.6 C) (Oral)  Resp 12  Ht 5\' 2"  (1.575 m)  Wt 144 lb 2.9 oz (65.4 kg)  BMI 26.36 kg/m2  SpO2 95%  .Intake/Output     07/19 0701 - 07/20 0700 07/20 0701 - 07/21 0700   P.O.     I.V. (mL/kg) 256.9 (3.9) 2632.6 (40.3)   Blood  581   IV Piggyback  520   Total Intake(mL/kg) 256.9 (3.9) 3733.6 (57.1)   Urine (mL/kg/hr) 750 (0.5) 2400 (3.3)   Blood  1075 (1.5)   Chest Tube  40 (0.1)   Total Output 750 3515   Net -493.2 +218.6        Urine Occurrence 3 x      . sodium chloride 20 mL/hr at 12/01/13 1700  . sodium chloride 20 mL/hr at 12/01/13 1700  . [START ON 12/02/2013] sodium chloride    . dexmedetomidine 0.5 mcg/kg/hr (12/01/13 1700)  . DOPamine 3 mcg/kg/min (12/01/13 1700)  . insulin (NOVOLIN-R) infusion 6.2 Units/hr (12/01/13 1700)  . lactated ringers 20 mL/hr (12/01/13 1551)  . milrinone 0.333 mcg/kg/min (12/01/13 1700)  . nitroGLYCERIN 5 mcg/min (12/01/13 1700)  . phenylephrine (NEO-SYNEPHRINE) Adult infusion       Lab Results  Component Value Date   WBC 7.7 12/01/2013   HGB 10.9* 12/01/2013   HCT 32.0* 12/01/2013   PLT 87* 12/01/2013   GLUCOSE 149* 12/01/2013   CHOL 138 11/27/2013   TRIG 77 11/27/2013   HDL 54 11/27/2013   LDLDIRECT 179.1 03/04/2013   LDLCALC 69 11/27/2013   ALT 19 11/30/2013   AST 24 11/30/2013   NA 142 12/01/2013   K 3.5* 12/01/2013   CL 106 12/01/2013   CREATININE 0.40* 12/01/2013   BUN 10 12/01/2013    CO2 20 11/30/2013   TSH 3.470 11/26/2013   INR 1.38 12/01/2013   HGBA1C 6.1* 11/30/2013   MICROALBUR 1.7 03/04/2013   Stable early post op Sedated on vent Not bleeding  Grace Isaac MD  Beeper [phone] Office [phone] 12/01/2013 6:08 PM

## 2013-12-01 NOTE — OR Nursing (Signed)
14:40 - 2nd call to SICU charge nurse

## 2013-12-01 NOTE — Progress Notes (Signed)
Nurse placed pt on wean parameters of 40% O2 and a RR of 4. Patient is stable and will continue with weaning.

## 2013-12-01 NOTE — OR Nursing (Signed)
13:05 - 1st call to SICU  - spoke to [facility], Network engineer

## 2013-12-01 NOTE — Anesthesia Procedure Notes (Addendum)
Anesthesia Procedure Note PA catheter:  Routine monitors. Timeout, sterile prep, drape, FBP R neck.  Trendelenburg position.  1% Lido local, finder and trocar RIJ 1st pass with US guidance.  Cordis placed over J wire. PA catheter in easily.  Sterile dressing applied.  Patient tolerated well, VSS.  Jenita Seashore, MD  08:20-08:33 Procedure Name: Intubation Date/Time: 12/01/2013 10:02 AM Performed by: Kyung Rudd Pre-anesthesia Checklist: Patient identified, Emergency Drugs available, Suction available, Patient being monitored and Timeout performed Patient Re-evaluated:Patient Re-evaluated prior to inductionOxygen Delivery Method: Circle system utilized Preoxygenation: Pre-oxygenation with 100% oxygen Intubation Type: IV induction Ventilation: Mask ventilation without difficulty Laryngoscope Size: Mac and 3 Grade View: Grade II Tube type: Oral Tube size: 8.0 mm Number of attempts: 1 Airway Equipment and Method: Stylet Placement Confirmation: ETT inserted through vocal cords under direct vision,  positive ETCO2 and breath sounds checked- equal and bilateral Secured at: 21 cm Tube secured with: Tape Dental Injury: Teeth and Oropharynx as per pre-operative assessment

## 2013-12-01 NOTE — Anesthesia Preprocedure Evaluation (Addendum)
Anesthesia Evaluation  Patient identified by MRN, date of birth, ID band Patient awake    Reviewed: Allergy & Precautions, H&P , NPO status , Patient's Chart, lab work & pertinent test results, reviewed documented beta blocker date and time   History of Anesthesia Complications Negative for: history of anesthetic complications  Airway Mallampati: II TM Distance: >3 FB Neck ROM: Full    Dental  (+) Teeth Intact, Dental Advisory Given   Pulmonary neg pulmonary ROS,  breath sounds clear to auscultation        Cardiovascular + angina + CAD (severe LAD disease, EF 30-35%) and + Past MI Rhythm:Regular Rate:Normal  11/28/13 ECHO: EF 40-45%, antero-apical akinesis, mild MR   Neuro/Psych negative neurological ROS     GI/Hepatic negative GI ROS, Neg liver ROS,   Endo/Other  diabetes (glu 104), Oral Hypoglycemic Agents  Renal/GU negative Renal ROS     Musculoskeletal   Abdominal   Peds  Hematology  (+) Blood dyscrasia (Hb 10.0), anemia ,   Anesthesia Other Findings   Reproductive/Obstetrics                       Anesthesia Physical Anesthesia Plan  ASA: III  Anesthesia Plan: General   Post-op Pain Management:    Induction: Intravenous  Airway Management Planned: Oral ETT  Additional Equipment: Arterial line, PA Cath, 3D TEE and Ultrasound Guidance Line Placement  Intra-op Plan:   Post-operative Plan: Post-operative intubation/ventilation  Informed Consent: I have reviewed the patients History and Physical, chart, labs and discussed the procedure including the risks, benefits and alternatives for the proposed anesthesia with the patient or authorized representative who has indicated his/her understanding and acceptance.   Dental advisory given  Plan Discussed with: CRNA, Anesthesiologist and Surgeon  Anesthesia Plan Comments: (Plan routine monitors, A line, PA cath, GETA with post op  ventilation)      Anesthesia Quick Evaluation

## 2013-12-01 NOTE — Progress Notes (Signed)
CABG today.  Candee Furbish, MD

## 2013-12-01 NOTE — Brief Op Note (Addendum)
      [address]       [place] [address]             [phone]       12/01/2013  1:21 PM  PATIENT:  Kathryn Logan  68 y.o. female  PRE-OPERATIVE DIAGNOSIS:  CAD with recent subendocardial MI less 5 days  POST-OPERATIVE DIAGNOSIS:  CAD  PROCEDURE:   CORONARY ARTERY BYPASS GRAFTING x 3 (LIMA-LAD, SVG-OM2, SVG-dRCA) ENDOSCOPIC VEIN HARVEST RIGHT LEG   SURGEON:  Surgeon(s): Grace Isaac, MD  ASSISTANT: Suzzanne Cloud, PA-C  ANESTHESIA:   general  PATIENT CONDITION:  ICU - intubated and hemodynamically stable.  PRE-OPERATIVE WEIGHT: 65 kg

## 2013-12-02 ENCOUNTER — Inpatient Hospital Stay (HOSPITAL_COMMUNITY): Payer: Medicare Other

## 2013-12-02 ENCOUNTER — Encounter (HOSPITAL_COMMUNITY): Payer: Self-pay | Admitting: Cardiothoracic Surgery

## 2013-12-02 LAB — CREATININE, SERUM
Creatinine, Ser: 0.67 mg/dL (ref 0.50–1.10)
GFR calc Af Amer: >90 mL/min (ref >90)
GFR calc non Af Amer: 90 mL/min — ABNORMAL LOW (ref >90)

## 2013-12-02 LAB — CBC
HCT: 28 % — ABNORMAL LOW (ref 36.0–46.0)
HCT: 30.9 % — ABNORMAL LOW (ref 36.0–46.0)
Hemoglobin: 9.7 g/dL — ABNORMAL LOW (ref 12.0–15.0)
Hemoglobin: 10.4 g/dL — ABNORMAL LOW (ref 12.0–15.0)
MCH: 29.6 pg (ref 26.0–34.0)
MCH: 29.3 pg (ref 26.0–34.0)
MCHC: 34.6 g/dL (ref 30.0–36.0)
MCHC: 33.7 g/dL (ref 30.0–36.0)
MCV: 85.4 fL (ref 78.0–100.0)
MCV: 87 fL (ref 78.0–100.0)
PLATELETS: 105 10*3/uL — AB (ref 150–400)
PLATELETS: 122 10*3/uL — AB (ref 150–400)
RBC: 3.28 MIL/uL — ABNORMAL LOW (ref 3.87–5.11)
RBC: 3.55 MIL/uL — ABNORMAL LOW (ref 3.87–5.11)
RDW: 14.4 % (ref 11.5–15.5)
RDW: 14.9 % (ref 11.5–15.5)
WBC: 8.8 10*3/uL (ref 4.0–10.5)
WBC: 11.5 10*3/uL — ABNORMAL HIGH (ref 4.0–10.5)

## 2013-12-02 LAB — GLUCOSE, CAPILLARY
GLUCOSE-CAPILLARY: 114 mg/dL — AB (ref 70–99)
GLUCOSE-CAPILLARY: 98 mg/dL (ref 70–99)
GLUCOSE-CAPILLARY: 111 mg/dL — AB (ref 70–99)
GLUCOSE-CAPILLARY: 158 mg/dL — AB (ref 70–99)
GLUCOSE-CAPILLARY: 96 mg/dL (ref 70–99)
GLUCOSE-CAPILLARY: 100 mg/dL — AB (ref 70–99)
GLUCOSE-CAPILLARY: 112 mg/dL — AB (ref 70–99)
GLUCOSE-CAPILLARY: 119 mg/dL — AB (ref 70–99)
GLUCOSE-CAPILLARY: 128 mg/dL — AB (ref 70–99)
GLUCOSE-CAPILLARY: 148 mg/dL — AB (ref 70–99)
GLUCOSE-CAPILLARY: 175 mg/dL — AB (ref 70–99)
Glucose-Capillary: 102 mg/dL — ABNORMAL HIGH (ref 70–99)
Glucose-Capillary: 103 mg/dL — ABNORMAL HIGH (ref 70–99)
Glucose-Capillary: 108 mg/dL — ABNORMAL HIGH (ref 70–99)
Glucose-Capillary: 108 mg/dL — ABNORMAL HIGH (ref 70–99)
Glucose-Capillary: 112 mg/dL — ABNORMAL HIGH (ref 70–99)
Glucose-Capillary: 114 mg/dL — ABNORMAL HIGH (ref 70–99)
Glucose-Capillary: 117 mg/dL — ABNORMAL HIGH (ref 70–99)
Glucose-Capillary: 119 mg/dL — ABNORMAL HIGH (ref 70–99)
Glucose-Capillary: 145 mg/dL — ABNORMAL HIGH (ref 70–99)
Glucose-Capillary: 146 mg/dL — ABNORMAL HIGH (ref 70–99)
Glucose-Capillary: 149 mg/dL — ABNORMAL HIGH (ref 70–99)
Glucose-Capillary: 169 mg/dL — ABNORMAL HIGH (ref 70–99)
Glucose-Capillary: 91 mg/dL (ref 70–99)
Glucose-Capillary: 94 mg/dL (ref 70–99)
Glucose-Capillary: 97 mg/dL (ref 70–99)

## 2013-12-02 LAB — TYPE AND SCREEN
ABO/RH(D): A POS
Antibody Screen: POSITIVE
DAT, IgG: NEGATIVE
Unit division: 0
Unit division: 0
Unit division: 0
Unit division: 0
Unit division: 0
Unit division: 0

## 2013-12-02 LAB — POCT I-STAT, CHEM 8
BUN: 13 mg/dL (ref 6–23)
CHLORIDE: 106 meq/L (ref 96–112)
Calcium, Ion: 1.21 mmol/L (ref 1.13–1.30)
Creatinine, Ser: 0.6 mg/dL (ref 0.50–1.10)
Glucose, Bld: 200 mg/dL — ABNORMAL HIGH (ref 70–99)
HCT: 29 % — ABNORMAL LOW (ref 36.0–46.0)
Hemoglobin: 9.9 g/dL — ABNORMAL LOW (ref 12.0–15.0)
Potassium: 5.1 mEq/L (ref 3.7–5.3)
Sodium: 135 mEq/L — ABNORMAL LOW (ref 137–147)
TCO2: 19 mmol/L (ref 0–100)

## 2013-12-02 LAB — BASIC METABOLIC PANEL
ANION GAP: 15 (ref 5–15)
BUN: 11 mg/dL (ref 6–23)
CO2: 20 mEq/L (ref 19–32)
CREATININE: 0.52 mg/dL (ref 0.50–1.10)
Calcium: 8 mg/dL — ABNORMAL LOW (ref 8.4–10.5)
Chloride: 104 mEq/L (ref 96–112)
GFR calc non Af Amer: >90 mL/min (ref >90)
Glucose, Bld: 96 mg/dL (ref 70–99)
POTASSIUM: 4.2 meq/L (ref 3.7–5.3)
Sodium: 139 mEq/L (ref 137–147)

## 2013-12-02 LAB — MAGNESIUM
Magnesium: 2.5 mg/dL (ref 1.5–2.5)
Magnesium: 2.5 mg/dL (ref 1.5–2.5)

## 2013-12-02 MED ORDER — INSULIN DETEMIR 100 UNIT/ML ~~LOC~~ SOLN
10.0000 [IU] | Freq: Every day | SUBCUTANEOUS | Status: DC
  Filled 2013-12-02: qty 0.1

## 2013-12-02 MED ORDER — INSULIN DETEMIR 100 UNIT/ML ~~LOC~~ SOLN
10.0000 [IU] | Freq: Once | SUBCUTANEOUS | Status: AC
  Administered 2013-12-02: 10 [IU] via SUBCUTANEOUS
  Filled 2013-12-02: qty 0.1

## 2013-12-02 MED ORDER — INSULIN ASPART 100 UNIT/ML ~~LOC~~ SOLN
0.0000 [IU] | SUBCUTANEOUS | Status: DC
  Administered 2013-12-02: 2 [IU] via SUBCUTANEOUS
  Administered 2013-12-02 (×2): 4 [IU] via SUBCUTANEOUS

## 2013-12-02 MED ORDER — ENOXAPARIN SODIUM 30 MG/0.3ML ~~LOC~~ SOLN
30.0000 mg | SUBCUTANEOUS | Status: DC
Start: 2013-12-02 — End: 2013-12-06
  Administered 2013-12-02 – 2013-12-05 (×4): 30 mg via SUBCUTANEOUS
  Filled 2013-12-02 (×5): qty 0.3

## 2013-12-02 MED FILL — Magnesium Sulfate Inj 50%: INTRAMUSCULAR | Qty: 10 | Status: AC

## 2013-12-02 MED FILL — Sodium Chloride IV Soln 0.9%: INTRAVENOUS | Qty: 2000 | Status: AC

## 2013-12-02 MED FILL — Heparin Sodium (Porcine) Inj 1000 Unit/ML: INTRAMUSCULAR | Qty: 30 | Status: AC

## 2013-12-02 MED FILL — Electrolyte-R (PH 7.4) Solution: INTRAVENOUS | Qty: 1000 | Status: AC

## 2013-12-02 MED FILL — Sodium Bicarbonate IV Soln 8.4%: INTRAVENOUS | Qty: 50 | Status: AC

## 2013-12-02 MED FILL — Lidocaine HCl IV Inj 20 MG/ML: INTRAVENOUS | Qty: 5 | Status: AC

## 2013-12-02 MED FILL — Mannitol IV Soln 20%: INTRAVENOUS | Qty: 500 | Status: AC

## 2013-12-02 MED FILL — Heparin Sodium (Porcine) Inj 1000 Unit/ML: INTRAMUSCULAR | Qty: 10 | Status: AC

## 2013-12-02 MED FILL — Potassium Chloride Inj 2 mEq/ML: INTRAVENOUS | Qty: 40 | Status: AC

## 2013-12-02 NOTE — Op Note (Signed)
NAMEHAVANA, BALDWIN NO.:  [id number]  MEDICAL RECORD NO.:  [id number]  LOCATION:  [id number]                        FACILITY:  [facility]  PHYSICIAN:  Lanelle Bal, MD    DATE OF BIRTH:  30-Dec-1945  DATE OF PROCEDURE:  12/01/2013 DATE OF DISCHARGE:                              OPERATIVE REPORT   PREOPERATIVE DIAGNOSIS:  Coronary occlusive disease with recent subendocardial myocardial infarction with depressed LV function.  POSTOPERATIVE DIAGNOSIS:  Coronary occlusive disease with recent subendocardial myocardial infarction with depressed LV function.  SURGICAL PROCEDURE:  Coronary artery bypass grafting x3 with the left internal mammary to the left anterior descending coronary artery, reverse saphenous vein graft to the second obtuse marginal coronary artery, reverse saphenous vein graft to the right coronary artery with right leg Endovein harvesting calf and thigh.  SURGEON:  Lanelle Bal, MD  FIRST ASSISTANT:  Suzzanne Cloud, PA-C  BRIEF HISTORY:  The patient is a 68 year old female with no previous history of cardiac disease who presents with an acute myocardial infarction, elevated troponins, and at the time of cath, inferior wall hypokinesis.  She underwent cardiac catheterization by Dr. Daneen Schick which demonstrated a high-grade complex plaque in the proximal LAD.  In addition, a 90% stenosis of a very large second obtuse marginal branch and a small nondominant right coronary artery.  Because of the critical nature of her lesions and her known diabetes, coronary artery bypass grafting was recommended.  The patient agreed and signed informed consent.  DESCRIPTION OF PROCEDURE:  With Swan-Ganz and arterial line monitors in place, the patient underwent general endotracheal anesthesia without incident.  Skin of the chest and legs were prepped with Betadine and draped in usual sterile manner.  Using the [employer] Endovein harvesting system, segment of vein  was harvested from the right thigh and calf and was of adequate quality and caliber.  Median sternotomy was performed. Left internal mammary artery was dissected down as a pedicle graft.  The distal artery was divided, had good free flow. The pericardium was opened.  The TEE and visualizing the anterior wall appeared to have a better function than originally noted on the cardiac cath.  The patient was systemically heparinized.  Ascending aorta was cannulated.  The right atrium was cannulated and aortic root vent cardioplegia needle was introduced into the ascending aorta.  The patient was placed on cardiopulmonary bypass 2.4 L/min/m2.  Sites of anastomosis were inspected were dissected out of the epicardium.  The patient's distal circumflex branches were extremely small as it arose from the AV groove less than 1 mm in size each.  The aortic cross-clamp was applied and 500 mL of cold blood potassium cardioplegia was administered with diastolic arrest of the heart.  Myocardial septal temperature was monitored throughout the cross-clamp.  Attention was turned first to the large second obtuse marginal, which was opened and was 1.8-mm in size using a running 7-0 Prolene and distal anastomosis was performed.  Attention was then turned to the small right coronary artery which was opened and was 1.2 mm in size, and using a running 8-0 Prolene a segment of reverse saphenous vein graft was anastomosed to the right coronary artery.  In the  midportion of the LAD, the vessel was opened and was of good quality, approximately 1.5-1.6 mm in size.  Using a running 8-0 Prolene, the left internal mammary artery was anastomosed to the left anterior descending coronary artery.  With release of the bulldog on the mammary artery with this rise in myocardial septal temperature, bulldog was placed back on the mammary artery, pedicle of the mammary was then tacked to the epicardium.  With cross-clamp still in  place, 2 punch aortotomies were performed and each of the 2 vein grafts were anastomosed to the ascending aorta.  Air was allowed to passively fill in the grafts and ascending aorta were de-aired.  The aortic cross-clamp was then removed with a total cross-clamp time of 71 minutes.  Before removing the cross-clamp, the bulldog and the mammary artery was removed with prompt rise in myocardial septal temperature.  The patient spontaneously converted to a sinus rhythm.  Sites of anastomosis were inspected and were free of bleeding.  Atrial and ventricular pacing wires were applied.  Graft markers were applied.  The patient was then ventilated and weaned from cardiopulmonary bypass on low-dose milrinone and dopamine.  She remained hemodynamically stable.  She was decannulated in usual fashion.  With the TEE, overall ventricular function appeared to have improved.  She was decannulated in usual fashion.  Protamine sulfate was administered with operative field hemostatic.  A left pleural tube and a Blake mediastinal drain were left in place.  The sternum was closed with #6 stainless steel wire.  Fascia was closed with interrupted 0 Vicryl, running 3-0 Vicryl in the subcutaneous tissue, 4-0 subcuticular stitch in skin edges.  Dry dressings were applied.  Sponge and needle count was reported as correct at completion of the procedure.  The patient tolerated the procedure without obvious complication.  Because of low hematocrit prior to pump of 30, she did require 2 units of packed red blood cells to be placed in the pump.  Total pump time was 103 minutes.     Lanelle Bal, MD     EG/MEDQ  D:  12/02/2013  T:  12/02/2013  Job:  [id number]

## 2013-12-02 NOTE — Progress Notes (Signed)
Patient ID: Kathryn Logan, female   DOB: 1945-12-12, 68 y.o.   MRN: [id number] TCTS DAILY ICU PROGRESS NOTE                   [address]            [place] [address]          [phone]   1 Day Post-Op Procedure(s) (LRB): CORONARY ARTERY BYPASS GRAFTING (CABG) x three, using left internal mammary artery, and right leg saphenous vein harvested endoscopically (LIMA to LAD, SVG to OM2 , SVG to dRCA) (N/A) INTRAOPERATIVE TRANSESOPHAGEAL ECHOCARDIOGRAM (N/A)  Total Length of Stay:  LOS: 6 days   Subjective: Awake , alert neuro intact, extubated last pm  Objective: Vital signs in last 24 hours: Temp:  [95.2 F (35.1 C)-99.3 F (37.4 C)] 99 F (37.2 C) (07/21 0700) Pulse Rate:  [85-109] 107 (07/21 0700) Cardiac Rhythm:  [-] Sinus tachycardia (07/21 0400) Resp:  [7-29] 13 (07/21 0700) BP: (93-124)/(47-68) 108/55 mmHg (07/21 0700) SpO2:  [90 %-97 %] 93 % (07/21 0700) FiO2 (%):  [40 %-50 %] 40 % (07/20 2032) Weight:  [154 lb 5.2 oz (70 kg)] 154 lb 5.2 oz (70 kg) (07/21 0500)  Filed Weights   11/30/13 0408 12/01/13 0507 12/02/13 0500  Weight: 147 lb 14.9 oz (67.1 kg) 144 lb 2.9 oz (65.4 kg) 154 lb 5.2 oz (70 kg)    Weight change: 10 lb 2.3 oz (4.6 kg)   Hemodynamic parameters for last 24 hours: PAP: (23-38)/(9-24) 26/12 mmHg CO:  [4.6 L/min-6.2 L/min] 5 L/min CI:  [2.7 L/min/m2-3.7 L/min/m2] 3 L/min/m2  Intake/Output from previous day: 07/20 0701 - 07/21 0700 In: 5576.4 [P.O.:100; I.V.:3375.4; Blood:581; IV Piggyback:1520] Out: 9379 [Urine:3610; Blood:1075; Chest Tube:330]  Intake/Output this shift:    Current Meds: Scheduled Meds: . acetaminophen  1,000 mg Oral 4 times per day   Or  . acetaminophen (TYLENOL) oral liquid 160 mg/5 mL  1,000 mg Per Tube 4 times per day  . aspirin EC  325 mg Oral Daily   Or  . aspirin  324 mg Per Tube Daily  . atorvastatin  80 mg Oral Daily  . bisacodyl  10 mg Oral Daily   Or  . bisacodyl  10 mg Rectal Daily  .  cefUROXime (ZINACEF)  IV  1.5 g Intravenous Q12H  . docusate sodium  200 mg Oral Daily  . famotidine (PEPCID) IV  20 mg Intravenous Q12H  . insulin regular  0-10 Units Intravenous TID WC  . metoCLOPramide (REGLAN) injection  10 mg Intravenous 4 times per day  . metoprolol tartrate  12.5 mg Oral BID   Or  . metoprolol tartrate  12.5 mg Per Tube BID  . [START ON 12/03/2013] pantoprazole  40 mg Oral Daily  . sodium chloride  3 mL Intravenous Q12H   Continuous Infusions: . sodium chloride 20 mL/hr at 12/01/13 1900  . sodium chloride 20 mL/hr at 12/01/13 1900  . sodium chloride    . dexmedetomidine Stopped (12/01/13 1945)  . DOPamine 3 mcg/kg/min (12/01/13 1900)  . insulin (NOVOLIN-R) infusion 1.7 Units/hr (12/02/13 0600)  . lactated ringers 20 mL/hr (12/01/13 1551)  . milrinone 0.333 mcg/kg/min (12/02/13 0145)  . nitroGLYCERIN Stopped (12/01/13 2300)  . phenylephrine (NEO-SYNEPHRINE) Adult infusion Stopped (12/01/13 1920)   PRN Meds:.albumin human, metoprolol, midazolam, morphine injection, ondansetron (ZOFRAN) IV, oxyCODONE, sodium chloride  General appearance: alert and cooperative Neurologic: intact Heart: regular rate and rhythm, S1, S2 normal,  no murmur, click, rub or gallop Lungs: clear to auscultation bilaterally Abdomen: soft, non-tender; bowel sounds normal; no masses,  no organomegaly Extremities: extremities normal, atraumatic, no cyanosis or edema and Homans sign is negative, no sign of DVT Wound: sternum stable  Lab Results: CBC: Recent Labs  12/01/13 2050 12/01/13 2053 12/02/13 0410  WBC 9.0  --  8.8  HGB 10.5* 9.5* 9.7*  HCT 30.7* 28.0* 28.0*  PLT 100*  --  105*   BMET:  Recent Labs  11/30/13 1000  12/01/13 2053 12/02/13 0410  NA 141  < > 139 139  K 4.1  < > 4.1 4.2  CL 102  < > 107 104  CO2 20  --   --  20  GLUCOSE 166*  < > 99 96  BUN 17  < > 9 11  CREATININE 0.78  < > 0.50 0.52  CALCIUM 9.3  --   --  8.0*  < > = values in this interval not  displayed.  PT/INR:  Recent Labs  12/01/13 1510  LABPROT 17.0*  INR 1.38   Radiology: Dg Chest Portable 1 View  12/01/2013   CLINICAL DATA:  Status post CABG  EXAM: PORTABLE CHEST - 1 VIEW  COMPARISON:  11/26/2013  FINDINGS: Endotracheal and nasogastric tubes are appropriately positioned. Epicardial pacer wires are in place. Mild enlargement of the cardiac silhouette is reidentified. Right IJ Swan-Ganz catheter tip is positioned over the right main pulmonary artery and could be pulled back approximately 3 cm for main pulmonary arterial position. Central vascular congestion is noted with trace pleural effusions. Mild prominence of the interstitial markings could suggest superimposed edema.  IMPRESSION: Cardiomegaly with mild trace interstitial edema.  Support apparatus as above, including right IJ Swan-Ganz catheter tip over right interlobar pulmonary artery, consider pulling back approximately 3 cm.   Electronically Signed   By: Conchita Paris M.D.   On: 12/01/2013 16:07     Assessment/Plan: S/P Procedure(s) (LRB): CORONARY ARTERY BYPASS GRAFTING (CABG) x three, using left internal mammary artery, and right leg saphenous vein harvested endoscopically (LIMA to LAD, SVG to OM2 , SVG to dRCA) (N/A) INTRAOPERATIVE TRANSESOPHAGEAL ECHOCARDIOGRAM (N/A) Mobilize Diuresis d/c tubes/lines See progression orders HGB stable, preop anemia required transfusion       [name] 12/02/2013 7:34 AM

## 2013-12-02 NOTE — Anesthesia Postprocedure Evaluation (Signed)
  Anesthesia Post-op Note  Patient: Kathryn Logan  Procedure(s) Performed: Procedure(s): CORONARY ARTERY BYPASS GRAFTING (CABG) x three, using left internal mammary artery, and right leg saphenous vein harvested endoscopically (LIMA to LAD, SVG to OM2 , SVG to dRCA) (N/A) INTRAOPERATIVE TRANSESOPHAGEAL ECHOCARDIOGRAM (N/A)  Patient Location: SICU  Anesthesia Type:General  Level of Consciousness: awake, alert , oriented and patient cooperative  Airway and Oxygen Therapy: Patient Spontanous Breathing and Patient connected to nasal cannula oxygen  Post-op Pain: none  Post-op Assessment: Post-op Vital signs reviewed, Patient's Cardiovascular Status Stable, Respiratory Function Stable, Patent Airway, No signs of Nausea or vomiting, Adequate PO intake and Pain level controlled  Post-op Vital Signs: Reviewed and stable  Last Vitals:  Filed Vitals:   12/02/13 1400  BP: 112/64  Pulse: 88  Temp:   Resp: 9    Complications: No apparent anesthesia complications

## 2013-12-02 NOTE — Progress Notes (Signed)
TCTS BRIEF SICU PROGRESS NOTE  1 Day Post-Op  S/P Procedure(s) (LRB): CORONARY ARTERY BYPASS GRAFTING (CABG) x three, using left internal mammary artery, and right leg saphenous vein harvested endoscopically (LIMA to LAD, SVG to OM2 , SVG to dRCA) (N/A) INTRAOPERATIVE TRANSESOPHAGEAL ECHOCARDIOGRAM (N/A)   Stable day NSR w/ stable BP off all drips O2 sats 96% on 4 L/min UOP adequate  Plan: Continue current plan  Kathryn Logan H 12/02/2013 7:44 PM

## 2013-12-03 ENCOUNTER — Inpatient Hospital Stay (HOSPITAL_COMMUNITY): Payer: Medicare Other

## 2013-12-03 LAB — CBC
HCT: 31 % — ABNORMAL LOW (ref 36.0–46.0)
Hemoglobin: 10.4 g/dL — ABNORMAL LOW (ref 12.0–15.0)
MCH: 29.6 pg (ref 26.0–34.0)
MCHC: 33.5 g/dL (ref 30.0–36.0)
MCV: 88.3 fL (ref 78.0–100.0)
Platelets: 132 10*3/uL — ABNORMAL LOW (ref 150–400)
RBC: 3.51 MIL/uL — ABNORMAL LOW (ref 3.87–5.11)
RDW: 15.4 % (ref 11.5–15.5)
WBC: 12 10*3/uL — ABNORMAL HIGH (ref 4.0–10.5)

## 2013-12-03 LAB — BASIC METABOLIC PANEL
Anion gap: 16 — ABNORMAL HIGH (ref 5–15)
BUN: 14 mg/dL (ref 6–23)
CO2: 21 mEq/L (ref 19–32)
Calcium: 8.6 mg/dL (ref 8.4–10.5)
Chloride: 102 mEq/L (ref 96–112)
Creatinine, Ser: 0.59 mg/dL (ref 0.50–1.10)
GFR calc Af Amer: >90 mL/min (ref >90)
GFR calc non Af Amer: >90 mL/min (ref >90)
Glucose, Bld: 89 mg/dL (ref 70–99)
Potassium: 4.4 mEq/L (ref 3.7–5.3)
Sodium: 139 mEq/L (ref 137–147)

## 2013-12-03 LAB — GLUCOSE, CAPILLARY
GLUCOSE-CAPILLARY: 133 mg/dL — AB (ref 70–99)
GLUCOSE-CAPILLARY: 91 mg/dL (ref 70–99)
GLUCOSE-CAPILLARY: 116 mg/dL — AB (ref 70–99)
Glucose-Capillary: 93 mg/dL (ref 70–99)
Glucose-Capillary: 114 mg/dL — ABNORMAL HIGH (ref 70–99)

## 2013-12-03 MED ORDER — DOCUSATE SODIUM 100 MG PO CAPS
200.0000 mg | ORAL_CAPSULE | Freq: Every day | ORAL | Status: DC
  Administered 2013-12-03 – 2013-12-04 (×2): 200 mg via ORAL
  Filled 2013-12-03 (×4): qty 2

## 2013-12-03 MED ORDER — INSULIN ASPART 100 UNIT/ML ~~LOC~~ SOLN
0.0000 [IU] | Freq: Three times a day (TID) | SUBCUTANEOUS | Status: DC
  Administered 2013-12-03 – 2013-12-05 (×7): 2 [IU] via SUBCUTANEOUS

## 2013-12-03 MED ORDER — METOPROLOL TARTRATE 12.5 MG HALF TABLET
12.5000 mg | ORAL_TABLET | Freq: Two times a day (BID) | ORAL | Status: DC
  Administered 2013-12-03 (×2): 12.5 mg via ORAL
  Filled 2013-12-03 (×4): qty 1

## 2013-12-03 MED ORDER — SODIUM CHLORIDE 0.9 % IJ SOLN
3.0000 mL | Freq: Two times a day (BID) | INTRAMUSCULAR | Status: DC
  Administered 2013-12-03 – 2013-12-05 (×6): 3 mL via INTRAVENOUS

## 2013-12-03 MED ORDER — ASPIRIN EC 81 MG PO TBEC
81.0000 mg | DELAYED_RELEASE_TABLET | Freq: Every day | ORAL | Status: DC
  Administered 2013-12-03 – 2013-12-06 (×4): 81 mg via ORAL
  Filled 2013-12-03 (×4): qty 1

## 2013-12-03 MED ORDER — SODIUM CHLORIDE 0.9 % IJ SOLN: 3.0000 mL | INTRAMUSCULAR | Status: DC | PRN

## 2013-12-03 MED ORDER — BISACODYL 10 MG RE SUPP
10.0000 mg | Freq: Every day | RECTAL | Status: DC | PRN
  Administered 2013-12-05: 10 mg via RECTAL
  Filled 2013-12-03: qty 1

## 2013-12-03 MED ORDER — TRAMADOL HCL 50 MG PO TABS: 50.0000 mg | ORAL_TABLET | ORAL | Status: DC | PRN

## 2013-12-03 MED ORDER — SODIUM CHLORIDE 0.9 % IV SOLN: 250.0000 mL | INTRAVENOUS | Status: DC | PRN

## 2013-12-03 MED ORDER — METFORMIN HCL 500 MG PO TABS
500.0000 mg | ORAL_TABLET | Freq: Two times a day (BID) | ORAL | Status: DC
  Administered 2013-12-03 – 2013-12-06 (×7): 500 mg via ORAL
  Filled 2013-12-03 (×10): qty 1

## 2013-12-03 MED ORDER — LISINOPRIL 2.5 MG PO TABS
2.5000 mg | ORAL_TABLET | Freq: Every day | ORAL | Status: DC
  Administered 2013-12-03 – 2013-12-06 (×4): 2.5 mg via ORAL
  Filled 2013-12-03 (×4): qty 1

## 2013-12-03 MED ORDER — BISACODYL 5 MG PO TBEC: 10.0000 mg | DELAYED_RELEASE_TABLET | Freq: Every day | ORAL | Status: DC | PRN

## 2013-12-03 MED ORDER — OXYCODONE HCL 5 MG PO TABS
5.0000 mg | ORAL_TABLET | ORAL | Status: DC | PRN
  Administered 2013-12-03: 5 mg via ORAL
  Filled 2013-12-03: qty 1

## 2013-12-03 MED ORDER — MOVING RIGHT ALONG BOOK
Freq: Once | Status: AC
  Administered 2013-12-03: 09:00:00
  Filled 2013-12-03: qty 1

## 2013-12-03 MED ORDER — ONDANSETRON HCL 4 MG PO TABS: 4.0000 mg | ORAL_TABLET | Freq: Four times a day (QID) | ORAL | Status: DC | PRN

## 2013-12-03 MED ORDER — FAMOTIDINE 20 MG PO TABS
20.0000 mg | ORAL_TABLET | Freq: Two times a day (BID) | ORAL | Status: DC
  Administered 2013-12-03 – 2013-12-06 (×7): 20 mg via ORAL
  Filled 2013-12-03 (×9): qty 1

## 2013-12-03 MED ORDER — PIOGLITAZONE HCL 30 MG PO TABS
30.0000 mg | ORAL_TABLET | Freq: Every day | ORAL | Status: DC
  Administered 2013-12-03 – 2013-12-06 (×4): 30 mg via ORAL
  Filled 2013-12-03 (×4): qty 1

## 2013-12-03 MED ORDER — ONDANSETRON HCL 4 MG/2ML IJ SOLN: 4.0000 mg | Freq: Four times a day (QID) | INTRAMUSCULAR | Status: DC | PRN

## 2013-12-03 NOTE — Progress Notes (Signed)
Patient transferred from 2S room 14 to 2W room 23, after report was called to Baxter Flattery, Therapist, sports.  All questions answered.  Patient's medications, chart, and personal belongings all sent with patient.  Husband present during transfer.  Safety measures maintained.  Doran Clay, RN

## 2013-12-03 NOTE — Progress Notes (Signed)
Patient ID: Kathryn Logan, female   DOB: 04-Sep-1945, 68 y.o.   MRN: [id number] TCTS DAILY ICU PROGRESS NOTE                   [address]            [place] [address]          [phone]   2 Days Post-Op Procedure(s) (LRB): CORONARY ARTERY BYPASS GRAFTING (CABG) x three, using left internal mammary artery, and right leg saphenous vein harvested endoscopically (LIMA to LAD, SVG to OM2 , SVG to dRCA) (N/A) INTRAOPERATIVE TRANSESOPHAGEAL ECHOCARDIOGRAM (N/A)  Total Length of Stay:  LOS: 7 days   Subjective: Feels better today, walked in unit, good pain control  Objective: Vital signs in last 24 hours: Temp:  [97.4 F (36.3 C)-98.6 F (37 C)] 97.6 F (36.4 C) (07/22 0823) Pulse Rate:  [87-110] 110 (07/22 0800) Cardiac Rhythm:  [-] Sinus tachycardia (07/22 0800) Resp:  [9-21] 15 (07/22 0600) BP: (99-147)/(56-81) 147/73 mmHg (07/22 0800) SpO2:  [91 %-99 %] 94 % (07/22 0800) Weight:  [154 lb 12.2 oz (70.2 kg)] 154 lb 12.2 oz (70.2 kg) (07/22 0500)  Filed Weights   12/01/13 0507 12/02/13 0500 12/03/13 0500  Weight: 144 lb 2.9 oz (65.4 kg) 154 lb 5.2 oz (70 kg) 154 lb 12.2 oz (70.2 kg)    Weight change: 7.1 oz (0.2 kg)   Hemodynamic parameters for last 24 hours:    Intake/Output from previous day: 07/21 0701 - 07/22 0700 In: 1180.7 [P.O.:720; I.V.:460.7] Out: 1100 [Urine:1060; Chest Tube:40]  Intake/Output this shift: Total I/O In: 100 [P.O.:60; I.V.:40] Out: 250 [Urine:250]  Current Meds: Scheduled Meds: . acetaminophen  1,000 mg Oral 4 times per day   Or  . acetaminophen (TYLENOL) oral liquid 160 mg/5 mL  1,000 mg Per Tube 4 times per day  . aspirin EC  325 mg Oral Daily   Or  . aspirin  324 mg Per Tube Daily  . atorvastatin  80 mg Oral Daily  . bisacodyl  10 mg Oral Daily   Or  . bisacodyl  10 mg Rectal Daily  . docusate sodium  200 mg Oral Daily  . enoxaparin (LOVENOX) injection  30 mg Subcutaneous Q24H  . insulin aspart  0-24 Units  Subcutaneous 6 times per day  . insulin detemir  10 Units Subcutaneous Daily  . insulin regular  0-10 Units Intravenous TID WC  . metoprolol tartrate  12.5 mg Oral BID   Or  . metoprolol tartrate  12.5 mg Per Tube BID  . pantoprazole  40 mg Oral Daily  . sodium chloride  3 mL Intravenous Q12H   Continuous Infusions: . sodium chloride 20 mL/hr at 12/01/13 1900  . sodium chloride 20 mL/hr at 12/03/13 0400  . sodium chloride    . insulin (NOVOLIN-R) infusion 3.4 Units/hr (12/02/13 1300)  . lactated ringers 20 mL/hr (12/01/13 1551)  . nitroGLYCERIN Stopped (12/01/13 2300)  . phenylephrine (NEO-SYNEPHRINE) Adult infusion Stopped (12/01/13 1920)   PRN Meds:.metoprolol, morphine injection, ondansetron (ZOFRAN) IV, oxyCODONE, sodium chloride  General appearance: alert and cooperative Neurologic: intact Heart: regular rate and rhythm, S1, S2 normal, no murmur, click, rub or gallop Lungs: clear to auscultation bilaterally Abdomen: soft, non-tender; bowel sounds normal; no masses,  no organomegaly Extremities: extremities normal, atraumatic, no cyanosis or edema and Homans sign is negative, no sign of DVT Wound: sternum stable  Lab Results: CBC: Recent Labs  12/02/13 1615 12/03/13 0408  WBC 11.5* 12.0*  HGB 10.4* 10.4*  HCT 30.9* 31.0*  PLT 122* 132*   BMET:  Recent Labs  12/02/13 0410 12/02/13 1609 12/02/13 1615 12/03/13 0408  NA 139 135*  --  139  K 4.2 5.1  --  4.4  CL 104 106  --  102  CO2 20  --   --  21  GLUCOSE 96 200*  --  89  BUN 11 13  --  14  CREATININE 0.52 0.60 0.67 0.59  CALCIUM 8.0*  --   --  8.6    PT/INR:  Recent Labs  12/01/13 1510  LABPROT 17.0*  INR 1.38   Radiology: Dg Chest Portable 1 View In Am  12/02/2013   CLINICAL DATA:  Post CABG, followup  EXAM: PORTABLE CHEST - 1 VIEW  COMPARISON:  Portable chest x-ray of 12/01/2013  FINDINGS: Aeration has improved slightly as has the mild edema pattern. The endotracheal tube is been removed. Left chest  tube remains and no definite pneumothorax is seen. Mild cardiomegaly is stable. Swan-Ganz catheter is noted with the tip in the right main pulmonary artery.  IMPRESSION: Endotracheal tube removed. Improved aeration with improvement in mild edema.   Electronically Signed   By: Ivar Drape M.D.   On: 12/02/2013 08:00   Dg Chest Portable 1 View  12/01/2013   CLINICAL DATA:  Status post CABG  EXAM: PORTABLE CHEST - 1 VIEW  COMPARISON:  11/26/2013  FINDINGS: Endotracheal and nasogastric tubes are appropriately positioned. Epicardial pacer wires are in place. Mild enlargement of the cardiac silhouette is reidentified. Right IJ Swan-Ganz catheter tip is positioned over the right main pulmonary artery and could be pulled back approximately 3 cm for main pulmonary arterial position. Central vascular congestion is noted with trace pleural effusions. Mild prominence of the interstitial markings could suggest superimposed edema.  IMPRESSION: Cardiomegaly with mild trace interstitial edema.  Support apparatus as above, including right IJ Swan-Ganz catheter tip over right interlobar pulmonary artery, consider pulling back approximately 3 cm.   Electronically Signed   By: Conchita Paris M.D.   On: 12/01/2013 16:07     Assessment/Plan: S/P Procedure(s) (LRB): CORONARY ARTERY BYPASS GRAFTING (CABG) x three, using left internal mammary artery, and right leg saphenous vein harvested endoscopically (LIMA to LAD, SVG to OM2 , SVG to dRCA) (N/A) INTRAOPERATIVE TRANSESOPHAGEAL ECHOCARDIOGRAM (N/A) Mobilize Diuresis d/c tubes/lines Plan for transfer to step-down: see transfer orders Expected Acute  Blood - loss Anemia   Murvin Gift B 12/03/2013 8:25 AM

## 2013-12-03 NOTE — Progress Notes (Addendum)
Pt ambulated 300 ft on 1L of O2. Pt did not endorse any SOB or dyspnea though RN has noted some SOB during trips to bathroom.  O2 sats remain above 90% during ambulation.  Pt assisted to edge of bed for dinner. Call bell within reach. Pt reminded of continued use of IS. Pt verbalized understanding. Will continue to monitor pt closely.

## 2013-12-03 NOTE — ED Provider Notes (Signed)
I saw and evaluated the patient, reviewed the resident's note and I agree with the findings and plan.   EKG Interpretation   Date/Time:  Wednesday November 26 2013 17:23:19 EDT Ventricular Rate:  137 PR Interval:  112 QRS Duration: 82 QT Interval:  276 QTC Calculation: 416 R Axis:   77 Text Interpretation:  Sinus tachycardia Nonspecific T wave abnormality  Abnormal ECG No previous ECGs available Confirmed by Jeneen Rinks  MD, [place]  [phone]) on 11/26/2013 6:02:38 PM      Please see my additional dictation  Tanna Furry, MD 12/03/13 1025

## 2013-12-04 ENCOUNTER — Inpatient Hospital Stay (HOSPITAL_COMMUNITY): Payer: Medicare Other

## 2013-12-04 LAB — BASIC METABOLIC PANEL
Anion gap: 14 (ref 5–15)
BUN: 18 mg/dL (ref 6–23)
CO2: 22 mEq/L (ref 19–32)
Calcium: 8.5 mg/dL (ref 8.4–10.5)
Chloride: 104 mEq/L (ref 96–112)
Creatinine, Ser: 0.6 mg/dL (ref 0.50–1.10)
GFR calc Af Amer: >90 mL/min (ref >90)
GFR calc non Af Amer: >90 mL/min (ref >90)
Glucose, Bld: 110 mg/dL — ABNORMAL HIGH (ref 70–99)
Potassium: 4.4 mEq/L (ref 3.7–5.3)
Sodium: 140 mEq/L (ref 137–147)

## 2013-12-04 LAB — CBC
HCT: 32 % — ABNORMAL LOW (ref 36.0–46.0)
Hemoglobin: 10.4 g/dL — ABNORMAL LOW (ref 12.0–15.0)
MCH: 29 pg (ref 26.0–34.0)
MCHC: 32.5 g/dL (ref 30.0–36.0)
MCV: 89.1 fL (ref 78.0–100.0)
Platelets: 186 10*3/uL (ref 150–400)
RBC: 3.59 MIL/uL — ABNORMAL LOW (ref 3.87–5.11)
RDW: 15.6 % — ABNORMAL HIGH (ref 11.5–15.5)
WBC: 13.2 10*3/uL — ABNORMAL HIGH (ref 4.0–10.5)

## 2013-12-04 LAB — GLUCOSE, CAPILLARY
GLUCOSE-CAPILLARY: 124 mg/dL — AB (ref 70–99)
GLUCOSE-CAPILLARY: 133 mg/dL — AB (ref 70–99)
Glucose-Capillary: 117 mg/dL — ABNORMAL HIGH (ref 70–99)
Glucose-Capillary: 122 mg/dL — ABNORMAL HIGH (ref 70–99)

## 2013-12-04 MED ORDER — MAGNESIUM HYDROXIDE 400 MG/5ML PO SUSP
30.0000 mL | Freq: Every day | ORAL | Status: DC | PRN
  Filled 2013-12-04: qty 30

## 2013-12-04 MED ORDER — METOPROLOL TARTRATE 25 MG PO TABS
25.0000 mg | ORAL_TABLET | Freq: Two times a day (BID) | ORAL | Status: DC
  Administered 2013-12-04 (×2): 25 mg via ORAL
  Filled 2013-12-04 (×4): qty 1

## 2013-12-04 NOTE — Progress Notes (Signed)
CARDIAC REHAB PHASE I   PRE:  Rate/Rhythm: 125 ST  BP:  Supine:   Sitting: 105/49  Standing:    SaO2: 96%RA  MODE:  Ambulation: 550 ft   POST:  Rate/Rhythm: 129 ST  BP:  Supine:   Sitting: 125/73  Standing:    SaO2: 95%RA 1000-1028 Pt walked 550 ft on RA with rolling walker with minimal asst. Gait steady. Tolerated well. No palpitations with elevated HR. Knows to rest 3 to 4 hours between walks. Very motivated.   Graylon Good, RN BSN  12/04/2013 10:22 AM

## 2013-12-04 NOTE — Progress Notes (Addendum)
[address]       [place] [address]             [phone]      3 Days Post-Op Procedure(s) (LRB): CORONARY ARTERY BYPASS GRAFTING (CABG) x three, using left internal mammary artery, and right leg saphenous vein harvested endoscopically (LIMA to LAD, SVG to OM2 , SVG to dRCA) (N/A) INTRAOPERATIVE TRANSESOPHAGEAL ECHOCARDIOGRAM (N/A) Subjective: Feels ok, overall progressing well  Objective: Vital signs in last 24 hours: Temp:  [97.6 F (36.4 C)-98.3 F (36.8 C)] 98.3 F (36.8 C) (07/23 0438) Pulse Rate:  [96-117] 109 (07/23 0438) Cardiac Rhythm:  [-] Sinus tachycardia (07/22 1940) Resp:  [18-20] 18 (07/23 0438) BP: (117-136)/(46-82) 117/66 mmHg (07/23 0438) SpO2:  [90 %-99 %] 94 % (07/23 0438) Weight:  [151 lb 10.8 oz (68.8 kg)] 151 lb 10.8 oz (68.8 kg) (07/23 0452)  Hemodynamic parameters for last 24 hours:    Intake/Output from previous day: 07/22 0701 - 07/23 0700 In: 460 [P.O.:420; I.V.:40] Out: 825 [Urine:825] Intake/Output this shift:    General appearance: alert, cooperative and no distress Heart: regular rate and rhythm and tachy Lungs: clear to auscultation bilaterally Abdomen: benign Extremities: minor edema Wound: evh incis ok, chest incis dressed  Lab Results:  Recent Labs  12/03/13 0408 12/04/13 0250  WBC 12.0* 13.2*  HGB 10.4* 10.4*  HCT 31.0* 32.0*  PLT 132* 186   BMET:  Recent Labs  12/03/13 0408 12/04/13 0250  NA 139 140  K 4.4 4.4  CL 102 104  CO2 21 22  GLUCOSE 89 110*  BUN 14 18  CREATININE 0.59 0.60  CALCIUM 8.6 8.5    PT/INR:  Recent Labs  12/01/13 1510  LABPROT 17.0*  INR 1.38   ABG    Component Value Date/Time   PHART 7.381 12/01/2013 2201   HCO3 22.3 12/01/2013 2201   TCO2 19 12/02/2013 1609   ACIDBASEDEF 3.0* 12/01/2013 2201   O2SAT 97.0 12/01/2013 2201   CBG (last 3)   Recent Labs  12/03/13 1630 12/03/13 2101 12/04/13 0608  GLUCAP 91 116* 124*   Scheduled Meds: . aspirin EC   81 mg Oral Daily  . atorvastatin  80 mg Oral Daily  . docusate sodium  200 mg Oral Daily  . enoxaparin (LOVENOX) injection  30 mg Subcutaneous Q24H  . famotidine  20 mg Oral BID  . insulin aspart  0-24 Units Subcutaneous TID AC & HS  . lisinopril  2.5 mg Oral Daily  . metFORMIN  500 mg Oral BID WC  . metoprolol tartrate  12.5 mg Oral BID  . pioglitazone  30 mg Oral Daily  . sodium chloride  3 mL Intravenous Q12H   Continuous Infusions:  PRN Meds:.sodium chloride, bisacodyl, bisacodyl, ondansetron (ZOFRAN) IV, ondansetron, oxyCODONE, sodium chloride, traMADol  Dg Chest 2 View  12/04/2013   CLINICAL DATA:  Postop CABG  EXAM: CHEST  2 VIEW  COMPARISON:  12/03/2013  FINDINGS: Right jugular catheter has been removed.  No pneumothorax  Bibasilar atelectasis in bilateral effusions shows slight interval improvement. Negative for edema.  IMPRESSION: Mild improvement in bibasilar atelectasis and bilateral pleural effusions.   Electronically Signed   By: Franchot Gallo M.D.   On: 12/04/2013 07:29   Dg Chest Port 1 View  12/03/2013   CLINICAL DATA:  Postop CABG, followup  EXAM: PORTABLE CHEST - 1 VIEW  COMPARISON:  Portable chest x-ray of 12/02/2013  FINDINGS: There is little change in suboptimal  aeration with basilar opacities consistent with atelectasis and effusions. The Swan-Ganz catheter has been removed and a venous sheath remains in the SVC. No pneumothorax is seen. Mild cardiomegaly is stable.  IMPRESSION: 1. Little change in poor aeration with basilar atelectasis and effusions. 2. Swan-Ganz catheter removed.   Electronically Signed   By: Ivar Drape M.D.   On: 12/03/2013 09:07    Assessment/Plan: S/P Procedure(s) (LRB): CORONARY ARTERY BYPASS GRAFTING (CABG) x three, using left internal mammary artery, and right leg saphenous vein harvested endoscopically (LIMA to LAD, SVG to OM2 , SVG to dRCA) (N/A) INTRAOPERATIVE TRANSESOPHAGEAL ECHOCARDIOGRAM (N/A)  1 doing well 2 tachycardia, will  increase beta blocker 3 push rehab and pulm toilet as able 4 labs stable, mild leukocytosis, H/H stable      LOS: 8 days    GOLD,[name] 12/04/2013   Home one to two days I have seen and examined Kathryn Logan and agree with the above assessment  and plan.  Grace Isaac MD [facility] [phone] Office [phone] 12/04/2013 8:57 AM

## 2013-12-04 NOTE — Progress Notes (Signed)
Pt ambulated approximately 550 ft accompanied by family using front wheel walker. Pt tolerated ambulation well and is currently resting in chair with call bell within reach. Will continue to monitor.  Kathryn Logan

## 2013-12-04 NOTE — Discharge Instructions (Signed)
Endoscopic Saphenous Vein Harvesting Care After Refer to this sheet in the next few weeks. These instructions provide you with information on caring for yourself after your procedure. Your health care provider may also give you more specific instructions. Your treatment has been planned according to current medical practices, but problems sometimes occur. Call your health care provider if you have any problems or questions after your procedure. HOME CARE INSTRUCTIONS Medicine  Take whatever pain medicine your surgeon prescribes. Follow the directions carefully. Do not take over-the-counter pain medicine unless your surgeon says it is okay. Some pain medicine can cause bleeding problems for several weeks after surgery.  Follow your surgeon's instructions about driving. You will probably not be permitted to drive after heart surgery.  Take any medicines your surgeon prescribes. Any medicines you took before your heart surgery should be checked with your health care provider before you start taking them again. Wound care  If your surgeon has prescribed an elastic bandage or stocking, ask how long you should wear it.  Check the area around your surgical cuts (incisions) whenever your bandages (dressings) are changed. Look for any redness or swelling.  You will need to return to have the stitches (sutures) or staples taken out. Ask your surgeon when to do that.  Ask your surgeon when you can shower or bathe. Activity  Try to keep your legs raised when you are sitting.  Do any exercises your health care providers have given you. These may include deep breathing exercises, coughing, walking, or other exercises. SEEK MEDICAL CARE IF:  You have any questions about your medicines.  You have more leg pain, especially if your pain medicine stops working.  New or growing bruises develop on your leg.  Your leg swells, feels tight, or becomes red.  You have numbness in your leg. SEEK IMMEDIATE  MEDICAL CARE IF:  Your pain gets much worse.  Blood or fluid leaks from any of the incisions.  Your incisions become warm, swollen, or red.  You have chest pain.  You have trouble breathing.  You have a fever.  You have more pain near your leg incision. MAKE SURE YOU:  Understand these instructions.  Will watch your condition.  Will get help right away if you are not doing well or get worse. Document Released: 01/11/2011 Document Revised: 05/06/2013 Document Reviewed: 01/11/2011 [facility] Patient Information 2015 [place], [place]. This information is not intended to replace advice given to you by your health care provider. Make sure you discuss any questions you have with your health care provider. Coronary Artery Bypass Grafting  Coronary artery bypass grafting (CABG) is a surgery that is done when arteries of the heart have become narrow or blocked with a fatty, waxy buildup. These arteries supply the heart with oxygen and nutrients it needs to pump blood to your body. During CABG, a section of blood vessel from another part of the body is taken and placed where there is narrowing or blocking. BEFORE THE PROCEDURE  Take all medicines as told by your doctor. You may be asked to start new medicines or stop others. Do not stop medicines or take different amounts on your own.  Do not eat or drink anything after midnight on the night before the surgery or as told by your doctor. Ask your doctor if it is okay to take a sip of water with any needed medicines. PROCEDURE  There are two ways of performing this surgery. Traditional open surgery  You will be given medicine  to make you sleep through the surgery (general anesthetic).  Once you are sleeping, a cut (incision) is made down the front of the chest through your breastbone. Your breastbone is spread open so your heart can be seen.  You are then placed on a heart-lung bypass machine. This machine gives oxygen to your blood while the  heart is being worked on.  Your heart is then stopped for a short amount of time. This is so Engineer, production can do the next steps.  Part of a leg vein may be taken and used to go around (bypass) the blocked heart arteries. Sometimes parts of an artery from inside your chest wall or from your arm are used.  When the bypass is done, you are taken off the machine.  Your heart is started again. It will start pumping as it once did.  The sac around your heart is closed.  Your chest is closed with stitches or staples.  You will have tubes in your chest. These tubes are connected to a suction device. This device will drain fluid and puff up your lungs again. Minimally invasive surgery This surgery is done from a cut that is made on the left side of your chest. If needed, your surgeon may not have to slow or stop your heart. AFTER THE PROCEDURE  You will be taken to a recovery area to be watched.  You may wake up with a tube in your throat that helps you breathe. You may be connected to a breathing machine. You will not be able to talk when the tube is in. The tube is taken out when it is safe.  You may be groggy and have some pain. You will be given medicine to help the pain. Document Released: 05/06/2013 Document Reviewed: 05/06/2013 [facility] Patient Information 2015 [place], [place]. This information is not intended to replace advice given to you by your health care provider. Make sure you discuss any questions you have with your health care provider.

## 2013-12-05 LAB — GLUCOSE, CAPILLARY
Glucose-Capillary: 121 mg/dL — ABNORMAL HIGH (ref 70–99)
Glucose-Capillary: 136 mg/dL — ABNORMAL HIGH (ref 70–99)
Glucose-Capillary: 97 mg/dL (ref 70–99)
Glucose-Capillary: 151 mg/dL — ABNORMAL HIGH (ref 70–99)

## 2013-12-05 MED ORDER — METOPROLOL TARTRATE 25 MG PO TABS
37.5000 mg | ORAL_TABLET | Freq: Two times a day (BID) | ORAL | Status: DC
  Administered 2013-12-05 – 2013-12-06 (×3): 37.5 mg via ORAL
  Filled 2013-12-05 (×4): qty 1

## 2013-12-05 MED ORDER — LORATADINE 10 MG PO TABS
10.0000 mg | ORAL_TABLET | Freq: Every day | ORAL | Status: DC
  Administered 2013-12-05 – 2013-12-06 (×2): 10 mg via ORAL
  Filled 2013-12-05 (×2): qty 1

## 2013-12-05 MED ORDER — SALINE SPRAY 0.65 % NA SOLN
1.0000 | NASAL | Status: DC | PRN
  Filled 2013-12-05: qty 44

## 2013-12-05 NOTE — Progress Notes (Signed)
OK to transfer to regular bed per Jadene Pierini, PA-C Kalman Shan, Baird Kay, RN

## 2013-12-05 NOTE — Progress Notes (Signed)
Pt ambulated approximately 500 ft with RW and assistance of family.  Pt tolerated well.  Pt back to room in chair with call bell in reach.  Will continue to monitor closely.

## 2013-12-05 NOTE — Progress Notes (Signed)
12/05/2013 3:05 PM EPW d/cd per orders and per protocol. Ends intact, pt tolerated well. Vital signs collected per protocol. Advised bedrest for one hour. Call bell within reach.  Kathryn Logan

## 2013-12-05 NOTE — Progress Notes (Addendum)
[address]       [place] [address]             [phone]   4 Days Post-Op Procedure(s) (LRB): CORONARY ARTERY BYPASS GRAFTING (CABG) x three, using left internal mammary artery, and right leg saphenous vein harvested endoscopically (LIMA to LAD, SVG to OM2 , SVG to dRCA) (N/A) INTRAOPERATIVE TRANSESOPHAGEAL ECHOCARDIOGRAM (N/A) Subjective: Feels well  Objective: Vital signs in last 24 hours: Temp:  [97.2 F (36.2 C)-98.1 F (36.7 C)] 97.2 F (36.2 C) (07/24 0321) Pulse Rate:  [98-112] 112 (07/24 0321) Cardiac Rhythm:  [-] Sinus tachycardia (07/24 0749) Resp:  [18-20] 18 (07/24 0321) BP: (118-142)/(56-81) 142/81 mmHg (07/24 0321) SpO2:  [95 %] 95 % (07/24 0321) Weight:  [149 lb 11.1 oz (67.9 kg)] 149 lb 11.1 oz (67.9 kg) (07/24 0321)  Hemodynamic parameters for last 24 hours:    Intake/Output from previous day: 07/23 0701 - 07/24 0700 In: 240 [P.O.:240] Out: 1700 [Urine:1700] Intake/Output this shift:   PE Gen: NAD Lungs: clear Cor: RRR Abd: benign Ext: minor edema Incis: healing well  Lab Results:  Recent Labs  12/03/13 0408 12/04/13 0250  WBC 12.0* 13.2*  HGB 10.4* 10.4*  HCT 31.0* 32.0*  PLT 132* 186   BMET:  Recent Labs  12/03/13 0408 12/04/13 0250  NA 139 140  K 4.4 4.4  CL 102 104  CO2 21 22  GLUCOSE 89 110*  BUN 14 18  CREATININE 0.59 0.60  CALCIUM 8.6 8.5    PT/INR: No results found for this basename: LABPROT, INR,  in the last 72 hours ABG    Component Value Date/Time   PHART 7.381 12/01/2013 2201   HCO3 22.3 12/01/2013 2201   TCO2 19 12/02/2013 1609   ACIDBASEDEF 3.0* 12/01/2013 2201   O2SAT 97.0 12/01/2013 2201   CBG (last 3)   Recent Labs  12/04/13 1620 12/04/13 2039 12/05/13 0557  GLUCAP 122* 133* 121*   Scheduled Meds: . aspirin EC  81 mg Oral Daily  . atorvastatin  80 mg Oral Daily  . docusate sodium  200 mg Oral Daily  . enoxaparin (LOVENOX) injection  30 mg Subcutaneous Q24H  . famotidine   20 mg Oral BID  . insulin aspart  0-24 Units Subcutaneous TID AC & HS  . lisinopril  2.5 mg Oral Daily  . metFORMIN  500 mg Oral BID WC  . metoprolol tartrate  25 mg Oral BID  . pioglitazone  30 mg Oral Daily  . sodium chloride  3 mL Intravenous Q12H   Continuous Infusions:  PRN Meds:.sodium chloride, bisacodyl, bisacodyl, magnesium hydroxide, ondansetron (ZOFRAN) IV, ondansetron, oxyCODONE, sodium chloride, traMADol  Dg Chest 2 View  12/04/2013   CLINICAL DATA:  Postop CABG  EXAM: CHEST  2 VIEW  COMPARISON:  12/03/2013  FINDINGS: Right jugular catheter has been removed.  No pneumothorax  Bibasilar atelectasis in bilateral effusions shows slight interval improvement. Negative for edema.  IMPRESSION: Mild improvement in bibasilar atelectasis and bilateral pleural effusions.   Electronically Signed   By: Franchot Gallo M.D.   On: 12/04/2013 07:29   Dg Chest Port 1 View  12/03/2013   CLINICAL DATA:  Postop CABG, followup  EXAM: PORTABLE CHEST - 1 VIEW  COMPARISON:  Portable chest x-ray of 12/02/2013  FINDINGS: There is little change in suboptimal aeration with basilar opacities consistent with atelectasis and effusions. The Swan-Ganz catheter has been removed and a venous sheath remains in the SVC.  No pneumothorax is seen. Mild cardiomegaly is stable.  IMPRESSION: 1. Little change in poor aeration with basilar atelectasis and effusions. 2. Swan-Ganz catheter removed.   Electronically Signed   By: Ivar Drape M.D.   On: 12/03/2013 09:07    Assessment/Plan: S/P Procedure(s) (LRB): CORONARY ARTERY BYPASS GRAFTING (CABG) x three, using left internal mammary artery, and right leg saphenous vein harvested endoscopically (LIMA to LAD, SVG to OM2 , SVG to dRCA) (N/A) INTRAOPERATIVE TRANSESOPHAGEAL ECHOCARDIOGRAM (N/A)  1 doing well 2 no new labs 3 sugars controlled 4 still a bit tachy but improved, occas systolic htn but BP mostly well controlled on current meds- will increase beta blocker, will need  epw's d/c'd 5 ambulation/pulm toilet- having some sinus congestion, will add nasal spray/claritin 6 poss home later today or tomorrow   LOS: 9 days    GOLD,[name] 12/05/2013  Still no bowel movement Plan d/c tomorrow I have seen and examined Kathryn Logan and agree with the above assessment  and plan.  Grace Isaac MD Beeper [phone] Office [phone] 12/05/2013 10:50 AM

## 2013-12-05 NOTE — Discharge Summary (Signed)
Physician Discharge Summary  Patient ID: Kathryn Logan MRN: [id number] DOB/AGE: 1945-07-28 68 y.o.  Admit date: 11/26/2013 Discharge date: 12/06/2013  Admission Diagnoses: NSTEMI    History of present Illness: The patient is a 68 year old female who presented to the emergency department with chest and back pain. She has a history of hyperlipidemia and diabetes. The pain began the morning of presentation on 11/26/2013. He was described as a pressure type sensation. There was also some associated nausea and diaphoresis. She was noted in the emergency department to be tachycardic with a heart rate in the 130's. Initial EKG showed nonspecific T-wave abnormalities. There is significant concern for a ACS and NSEMI. Initial troponin was elevated and cardiology consultation was obtained. The patient was admitted on nitroglycerin and heparin. Initial pro BNP was noted to be greater than 9000 although she evidenced no clinical congestive heart failure. Thyroid function studies were obtained and TSH/free T4 were normal. She was felt to require cardiac catheterization as well as  further evaluation and treatment.     Past Medical History   Diagnosis  Date   .  Osteopenia    .  Hyperlipidemia    .  Diabetes mellitus without complication     Past Surgical History   Procedure  Laterality  Date   .  Tonsillectomy and adenoidectomy     .  G 1 p 1     .  No colonoscopy       SOC reviewed    History   Smoking status   .  Never Smoker   Smokeless tobacco   .  Not on file    History   Alcohol Use   .  Yes     Comment: Occasionally    History    Social History   .  Marital Status:  Married     Spouse Name:  N/A     Number of Children:  N/A   .  Years of Education:  N/A    Occupational History   .  Not on file.    Social History Main Topics   .  Smoking status:  Never Smoker   .  Smokeless tobacco:  Not on file   .  Alcohol Use:  Yes      Comment: Occasionally   .  Drug Use:  No    .  Sexual Activity:  Not on file    Other Topics  Concern   .  Not on file    Social History Narrative   .  No narrative on file    No Known Allergies    Discharge Diagnoses:  Principal Problem:   NSTEMI (non-ST elevated myocardial infarction) Active Problems:   HYPERLIPIDEMIA NEC/NOS   Type II or unspecified type diabetes mellitus without mention of complication, uncontrolled   S/P CABG x 3   Discharged Condition: good  Hospital Course: The patient was admitted and ruled in for a non-ST segment elevation myocardial infarction. She was treated medically and stabilized. Cardiac catheterization was done and recommendations were for coronary bypass grafting.    Postoperative hospital course:  The patient is overall doing quite well. She maintained stable hemodynamics although has been tachycardic necessitating up titration of her beta blocker. This is improved over time. She was weaned from the ventilator without difficulty. She has remained neurologically intact. She is tolerating gradually increasing activities using standard protocols. Incisions are noted healing well without evidence of infection. She has a postoperative anemia which is stable.  She has mild volume overload which is improving. Blood sugars have been stable on sliding scale insulin.  She will need outpatient follow up with Dr. Linna Darner for hemoglobin A1C=6.1.  She has been evaluated on today's date and is stable for discharge home.     Consults: cardiology and cardiothoracic surgery  Significant Diagnostic Studies: cardiac catheterization Left Heart Catheterization with Coronary Angiography Report  Kathryn Logan  68 y.o.  female  11/25/1945  Procedure Date: 11/27/2013  Referring Physician: Carlyle Dolly, M.D.  Primary Cardiologist: Carlyle Dolly, M.D.  INDICATIONS: Non-ST elevation MI  PROCEDURE: 1. Left heart catheterization; 2. Left ventriculography; 3. Coronary angiography  CONSENT:  The risks,  benefits, and details of the procedure were explained in detail to the patient. Risks including death, stroke, heart attack, kidney injury, allergy, limb ischemia, bleeding and radiation injury were discussed. The patient verbalized understanding and wanted to proceed. Informed written consent was obtained.  PROCEDURE TECHNIQUE: After Xylocaine anesthesia a 5 French Slender sheath was placed in the right radial artery with an angiocath and the modified Seldinger technique. Coronary angiography was done using a 5 F JR 4 and JL 3.5 cm diagnostic catheter. Left ventriculography was done using the JR 4 catheter and hand injection.  Images were reviewed and discussion with the patient concerning surgery versus PCI. Considering left ventricular systolic dysfunction, proximal LAD involvement, and other focal but present disease in the right and circumflex coronary artery, we decided to proceed with evaluation for surgery. The case was terminated.  A wrist band was used for hemostasis with good results.  CONTRAST: Total of 100 cc.  COMPLICATIONS: none  HEMODYNAMICS: Aortic pressure 91/51 mmHg; LV pressure 94/13 mmHg; LVEDP 16 mmHg  ANGIOGRAPHIC DATA: The left main coronary artery is widely patent.  The left anterior descending artery is large and trans-apical with a proximal eccentric 99% stenosis. This is the culprit vessel for the patient's presentation and wall motion abnormality  The left circumflex artery is dominant. The PDA arises from the circumflex. The first obtuse marginal contains proximal 75-80% obstruction. The vessel distribution is relatively small. The second obtuse marginal is large in caliber and distribution. There is proximal to ostial 95% stenosis. The third, fourth, and fifth obtuse marginals are free of any significant obstruction. There is 50% ostial PDA. The PDA is small.  The right coronary artery is nondominant. There is ostial 70% narrowing with catheter damping. The mid vessel  contains segmental 95% stenosis proximal to the dominant acute marginal branch. The acute marginal branch is graftable.  LEFT VENTRICULOGRAM: Left ventricular angiogram was done in the 30 RAO projection and revealed mid to apical anterior severe hypokinesis. Inferior apical severe hypokinesis. Estimated ejection fraction 35%.  IMPRESSIONS: 1. Severe coronary artery disease involving the proximal LAD, the first and second marginal branches, and a nondominant right coronary artery.  2. Moderate to severe left ventricular systolic dysfunction and a predominant apical distribution compatible with LAD occlusion. EF is 30-35%.  RECOMMENDATION: Given LV dysfunction, large LAD distribution, and diabetes, CABG and this functional a young 68 year old female would appear to be her best long-term option.  [facility] has been notified. They request Dr. Servando Snare but prefer expedient treatment if they have to wait.  We have been asked to consult for consideration of coronary artery bypass grafting     Treatments:   DATE OF PROCEDURE: 12/01/2013  DATE OF DISCHARGE:  OPERATIVE REPORT  PREOPERATIVE DIAGNOSIS: Coronary occlusive disease with recent  subendocardial myocardial infarction with depressed LV function.  POSTOPERATIVE DIAGNOSIS: Coronary occlusive disease with recent  subendocardial myocardial infarction with depressed LV function.  SURGICAL PROCEDURE: Coronary artery bypass grafting x3 with the left  internal mammary to the left anterior descending coronary artery,  reverse saphenous vein graft to the second obtuse marginal coronary  artery, reverse saphenous vein graft to the right coronary artery with  right leg Endovein harvesting calf and thigh.  SURGEON: Lanelle Bal, MD  FIRST ASSISTANT: Suzzanne Cloud, PA-C    Discharge Exam: Blood pressure 131/70, pulse 102, temperature 97.8 F (36.6 C), temperature source Oral, resp. rate 18, height 5\' 2"  (1.575 m), weight 145 lb 11.6 oz (66.1 kg), SpO2  95.00%. Resp: clear to auscultation bilaterally Cardio: regular rate and rhythm Extremities: no edema, redness or tenderness in the calves or thighs Incision/Wound: Clean and dry   Disposition: Home      Discharge Instructions   Amb Referral to Cardiac Rehabilitation    Complete by:  As directed            Medications:   Medication List         aspirin EC 81 MG tablet  Take 81 mg by mouth daily.     atorvastatin 80 MG tablet  Commonly known as:  LIPITOR  Take 1 tablet (80 mg total) by mouth daily.     BIOTIN PO  Take 2,500 mcg by mouth daily.     glucose blood test strip  Commonly known as:  ONE TOUCH ULTRA TEST  Check blood sugar daily as directed, DX 250.02     lanolin-mineral oil Oil  Apply 1 application topically every other day.     lisinopril 2.5 MG tablet  Commonly known as:  PRINIVIL,ZESTRIL  Take 1 tablet (2.5 mg total) by mouth daily.     metFORMIN 500 MG tablet  Commonly known as:  GLUCOPHAGE  Take 500 mg by mouth 2 (two) times daily with a meal.     metoprolol tartrate 12.5 mg Tabs tablet  Commonly known as:  LOPRESSOR  Take 1.5 tablets (37.5 mg total) by mouth 2 (two) times daily.     [place] LANCETS FINE Misc  - 1 each by Does not apply route as directed. Check blood sugar daily as directed, DX 250.02  - HOLD UNTIL PATIENT REQUEST     oxyCODONE 5 MG immediate release tablet  Commonly known as:  Oxy IR/ROXICODONE  Take 1-2 tablets (5-10 mg total) by mouth every 3 (three) hours as needed for severe pain.     pioglitazone 30 MG tablet  Commonly known as:  ACTOS  Take 1 tablet (30 mg total) by mouth daily.        Follow Up: Follow-up Information   Follow up with GERHARDT,EDWARD B, MD. (01/08/2014 at 12:30 PM to see the surgeon. Please obtain a chest x-ray at 11:30 AM at [facility]. [facility] is located in the same office complex.)    Specialty:  Cardiothoracic Surgery   Contact information:   [address] [place] [address] [phone]       Follow up with Carlyle Dolly, F, MD. (Please obtain an appointment to see cardiologist in 2 weeks if it has not already been arranged.)    Specialty:  Cardiology   Contact information:   [address] [place] [address] [phone]       Signed: Burke Keels 12/06/2013, 8:47 AM  The patient has been discharged on:  1.Beta Blocker: Yes [  x ]  No [ ]   If No, reason:    2.Ace Inhibitor/ARB: Yes [ x ]  No [  ]  If No, reason:    3.Statin: Yes [ x ]  No [ ]   If No, reason:    4.Ecasa: Yes [ x ]  No [ ]   If No, reason:

## 2013-12-05 NOTE — Progress Notes (Signed)
[id number] Education completed with pt and son. Understanding voiced. Gave diabetic and heart healthy diets and discussed carb counting. Discussed CRP2 and pt gave permission to refer to [facility] program. Pt has walked once today and will walk twice more if not discharged.Graylon Good RN BSN 12/05/2013 9:29 AM

## 2013-12-06 LAB — GLUCOSE, CAPILLARY
GLUCOSE-CAPILLARY: 108 mg/dL — AB (ref 70–99)
Glucose-Capillary: 115 mg/dL — ABNORMAL HIGH (ref 70–99)

## 2013-12-06 MED ORDER — METOPROLOL TARTRATE 12.5 MG HALF TABLET: 37.5000 mg | ORAL_TABLET | Freq: Two times a day (BID) | ORAL | Status: DC

## 2013-12-06 MED ORDER — LISINOPRIL 2.5 MG PO TABS: 2.5000 mg | ORAL_TABLET | Freq: Every day | ORAL | Status: DC

## 2013-12-06 MED ORDER — ATORVASTATIN CALCIUM 80 MG PO TABS: 80.0000 mg | ORAL_TABLET | Freq: Every day | ORAL | Status: DC

## 2013-12-06 MED ORDER — OXYCODONE HCL 5 MG PO TABS
5.0000 mg | ORAL_TABLET | ORAL | Status: DC | PRN
Start: 2013-12-06 — End: 2014-04-27

## 2013-12-06 NOTE — Progress Notes (Signed)
       [address]       [place] [address]             [phone]          5 Days Post-Op Procedure(s) (LRB): CORONARY ARTERY BYPASS GRAFTING (CABG) x three, using left internal mammary artery, and right leg saphenous vein harvested endoscopically (LIMA to LAD, SVG to OM2 , SVG to dRCA) (N/A) INTRAOPERATIVE TRANSESOPHAGEAL ECHOCARDIOGRAM (N/A)  Subjective: Feels well, had a BM yesterday.  Ready to go home.   Objective: Vital signs in last 24 hours: Patient Vitals for the past 24 hrs:  BP Temp Temp src Pulse Resp SpO2 Weight  12/06/13 0452 131/70 mmHg 97.8 F (36.6 C) Oral 102 18 95 % 145 lb 11.6 oz (66.1 kg)  12/05/13 2024 119/66 mmHg 97.6 F (36.4 C) Oral 118 18 94 % -  12/05/13 1600 132/68 mmHg - - 108 18 94 % -  12/05/13 1545 120/64 mmHg - - 106 18 93 % -  12/05/13 1530 136/73 mmHg - - 104 18 94 % -  12/05/13 1515 137/71 mmHg - - 105 18 94 % -  12/05/13 1500 130/76 mmHg - - 107 18 95 % -  12/05/13 1116 123/70 mmHg - - 119 - - -   Current Weight  12/06/13 145 lb 11.6 oz (66.1 kg)   PRE-OPERATIVE WEIGHT: 65 kg   Intake/Output from previous day: 07/24 0701 - 07/25 0700 In: 720 [P.O.:720] Out: 700 [Urine:700]    PHYSICAL EXAM:  Heart: RRR, mildly tachy 100-110 Lungs: Clear Wound: Clean and dry Extremities: Minimal LE edema   Lab Results: CBC: Recent Labs  12/04/13 0250  WBC 13.2*  HGB 10.4*  HCT 32.0*  PLT 186   BMET:  Recent Labs  12/04/13 0250  NA 140  K 4.4  CL 104  CO2 22  GLUCOSE 110*  BUN 18  CREATININE 0.60  CALCIUM 8.5    PT/INR: No results found for this basename: LABPROT, INR,  in the last 72 hours    Assessment/Plan: S/P Procedure(s) (LRB): CORONARY ARTERY BYPASS GRAFTING (CABG) x three, using left internal mammary artery, and right leg saphenous vein harvested endoscopically (LIMA to LAD, SVG to OM2 , SVG to dRCA) (N/A) INTRAOPERATIVE TRANSESOPHAGEAL ECHOCARDIOGRAM (N/A) CV- BPs stable, tachycardia  improved with increase of beta blocker.   Doing well otherwise. Plan home today- instructions reviewed with patient.   LOS: 10 days    Karliah Kowalchuk H 12/06/2013

## 2013-12-06 NOTE — Progress Notes (Signed)
Reviewed DC summary with patient. Answered all questions and patient has Rollator at home, no PT recommended. Patient home with son and husband. Glade Nurse, RN

## 2013-12-06 NOTE — Progress Notes (Signed)
CARDIAC REHAB PHASE I   PRE:  Rate/Rhythm: 115  BP:  Supine:   Sitting: 126/54  Standing:    SaO2: 95 RA  MODE:  Ambulation: 550 ft   POST:  Rate/Rhythem: 122  BP:  Supine:   Sitting: 126/72  Standing:    SaO2: 99 RA  Pt ambulated 550 ft with assist x1 using rolling walker.  Pt tolerated walk well and had no c/o with walk.  Pt has rollator walk at home to use.  Pt was encouraged to use walker at home.  Answered questions about exercise and Phase II rehab for pt and son.  Pt is eager to start rehab!  Pt returned to chair after walk with call bell in reach and son in room. Alberteen Sam, MA, ACSM RCEP [phone]  Clotilde Dieter

## 2013-12-06 NOTE — Progress Notes (Signed)
Removed CT sutures per MD order per hospital policy. Patient tolerated well. Applied benzoin and steri strips to CT suture site. Will continue to monitor closely.

## 2013-12-11 ENCOUNTER — Ambulatory Visit (INDEPENDENT_AMBULATORY_CARE_PROVIDER_SITE_OTHER): Payer: Medicare Other | Admitting: Internal Medicine

## 2013-12-11 ENCOUNTER — Encounter: Payer: Self-pay | Admitting: Internal Medicine

## 2013-12-11 VITALS — BP 110/70 | HR 105 | Temp 98.1°F | Ht 62.0 in | Wt 139.0 lb

## 2013-12-11 DIAGNOSIS — R7303 Prediabetes: Secondary | ICD-10-CM

## 2013-12-11 DIAGNOSIS — Z951 Presence of aortocoronary bypass graft: Secondary | ICD-10-CM | POA: Diagnosis not present

## 2013-12-11 DIAGNOSIS — I214 Non-ST elevation (NSTEMI) myocardial infarction: Secondary | ICD-10-CM | POA: Diagnosis not present

## 2013-12-11 DIAGNOSIS — R7309 Other abnormal glucose: Secondary | ICD-10-CM | POA: Diagnosis not present

## 2013-12-11 NOTE — Progress Notes (Signed)
Pre visit review using our clinic review tool, if applicable. No additional management support is needed unless otherwise documented below in the visit note. 

## 2013-12-11 NOTE — Patient Instructions (Signed)
Eat a low-fat diet with lots of fruits and vegetables, up to 7-9 servings per day. Consume less than 30  Grams (preferably ZERO) of sugar per day from foods & drinks with High Fructose Corn Syrup (HFCS) sugar as #1,2,3 or # 4 on label.[employer], [employer] do not carry products with HFCS. Follow a  low carb nutrition program such as [place] or The [employer] Sugar [employer]usters  to prevent Diabetes progression . White carbohydrates (potatoes, rice, bread, and pasta) have a high spike of sugar and a high load of sugar. For example a  baked potato has a cup of sugar and a  french fry  2 teaspoons of sugar. Yams, wild  rice, whole grained bread &  wheat pasta have been much lower spike and load of  sugar. Portions should be the size of a deck of cards or your palm. Check glucose once daily if possible Fasting or morning glucose recommended M, W, F, & Sun if possible. Goal= 100-150 Glucose 2 hours after breakfast Tues, after lunch Thurs & 2 hrs after eve meal Sat if possible. Goal = < 180

## 2013-12-11 NOTE — Progress Notes (Signed)
   Subjective:    Patient ID: Kathryn Logan, female    DOB: 02/26/1946, 68 y.o.   MRN: [id number]  [facility] record 7/15-7/25/15 were reviewed. She was hospitalized with a non-STEMI; presentation heart rate was in the 130s. Her BNP was over 9000 but she showed no sign of congestive heart failure. She is on Pioglitazone. Catheterization revealed LAD disease with involvement of first and second marginal branches. Ejection fraction was 30-35%. She underwent bypass grafting 12/01/13.   She was discharged on atorvastatin 80 mg daily; LDL goal will be less than 70.   She is continuing her Pioglitazone and metformin. Her A1c was 8.6 % in Feb and  7.5% in March of this year. On 7/16 it was 6.2%; it was rechecked 7/19 & was 6.1%.  FBS range/ average:115-131 2 hour post meal glucose< 150 Hypoglycemia not reported Ophthalmologic exam is not current  Foot care not current Diet is heart healthy , low carb Exercise being increased; to enter Cardiac Rehab    Review of Systems  Polyuria, polyphagia, polydipsia absent. There is no blurred vision, double vision, or loss of vision.  Also denied are numbness, tingling, or burning of the extremities. No nonhealing skin lesions present. Weight is dropping.      Objective:   Physical Exam Appears healthy and well-nourished & in no acute distress  No carotid bruits are present.No neck pain distention present at 10 - 15 degrees. Thyroid normal to palpation  Heart rhythm  normal with slight tachycardia &  gallop .No murmur  Chest is clear with no increased work of breathing  There is no evidence of aortic aneurysm or renal artery bruits  Abdomen soft with no organomegaly or masses. No HJR  No clubbing, cyanosis or edema present.  Pedal pulses are intact   No ischemic skin changes are present . Op sites w/o cellulitis.Fingernails/ toenails healthy   Alert and oriented. Strength, tone, DTRs reflexes normal          Assessment &  Plan:  #1 A1c value is down to the prediabetic range. Metformin should be discontinued with an A1c at this level.  #2 history of elevated BNP & decreased ejection fraction. The generic Actos should be stopped as risk outweighs benefit.  #3 status post non-ST elevation myocardial infarction and bypass grafting; stable clinically

## 2013-12-16 ENCOUNTER — Telehealth: Payer: Self-pay | Admitting: *Deleted

## 2013-12-16 ENCOUNTER — Encounter: Payer: Medicare Other | Admitting: Internal Medicine

## 2013-12-16 NOTE — Telephone Encounter (Signed)
Faxed order for cardiac rehab - phase 2

## 2013-12-18 ENCOUNTER — Encounter: Payer: Self-pay | Admitting: Cardiology

## 2013-12-18 ENCOUNTER — Ambulatory Visit (INDEPENDENT_AMBULATORY_CARE_PROVIDER_SITE_OTHER): Payer: Medicare Other | Admitting: Cardiology

## 2013-12-18 VITALS — BP 124/68 | HR 95 | Ht 62.0 in | Wt 136.0 lb

## 2013-12-18 DIAGNOSIS — I2510 Atherosclerotic heart disease of native coronary artery without angina pectoris: Secondary | ICD-10-CM | POA: Diagnosis not present

## 2013-12-18 NOTE — Patient Instructions (Signed)
Your physician has recommended you make the following change in your medication: INCREASE metoprolol tartrate to 50mg  twice daily.   Your physician recommends that you schedule a follow-up appointment in: 6-8 weeks (September) with Dr. Debara Pickett

## 2013-12-18 NOTE — Progress Notes (Signed)
Patient ID: Kathryn Logan, female   DOB: Jan 09, 1946, 68 y.o.   MRN: [id number]    12/18/2013 Kathryn Logan   [birth year]  [id number]  Primary Physicia Kathryn Cobble, MD Primary Cardiologist: Dr. Debara Logan  HPI:  Kathryn Logan presents to clinic for post hospital followup after undergoing coronary artery bypass grafting . She is a 68 year old female with a h/o diet controlled DM but no prior cardiac history who presented to [facility] on 11/26/2013 with chest discomfort with associated nausea and diaphoresis. EKG demonstrated nonspecific T-wave abnormalities. Initial cardiac enzymes were elevated and she ruled in for non-ST elevation myocardial infarction. Subsequently, she underwent a left heart catheterization, perform by Dr. Tamala Logan, which revealed severe coronary artery disease involving the proximal LAD, the first and second marginal branches and a nondominant right coronary artery. She was found to have moderate to severe left ventricular systolic dysfunction with an estimated ejection fraction of 30-35%. However, post-cath 2-D echocardiogram revealed an EF of 40-45%. Surgical revascularization was recommended for her CAD. She ultimately underwent CABG x3 by Dr. Jobie Logan on 12/01/2048. She received a LIMA-LAD, SVG-OM2, SVG-dRCA. She had an uncomplicated postoperative course. She was discharged home on 12/05/2013. She was discharged home on aspirin, metoprolol, lisinopril and atorvastatin.  [name] today for post hospital followup. She is accompanied by her husband. She states that she has been doing fine since discharge from the hospital. She denies any recurrent anginal symptoms. No shortness of breath. She denies orthopnea, PND, lower extremity edema. She denies palpitations, dizziness, lightheadedness, syncope/near-syncope. She states she has been fully compliant with her medications.    Current Outpatient Prescriptions  Medication Sig Dispense Refill  . aspirin EC 81  MG tablet Take 81 mg by mouth daily.       [name] atorvastatin (LIPITOR) 80 MG tablet Take 1 tablet (80 mg total) by mouth daily.  30 tablet  1  . BIOTIN PO Take 2,500 mcg by mouth daily.       . bisacodyl (DULCOLAX) 5 MG EC tablet Take 5 mg by mouth. Tuesday and Wednesday      . Docusate Calcium (STOOL SOFTENER PO) Take by mouth as needed.      [name] glucose blood (ONE TOUCH ULTRA TEST) test strip Check blood sugar daily as directed, DX 250.02  100 each  12  . lanolin-mineral oil (BABY OIL) OIL Apply 1 application topically every other day.      . lisinopril (PRINIVIL,ZESTRIL) 2.5 MG tablet Take 1 tablet (2.5 mg total) by mouth daily.  30 tablet  1  . loratadine (CLARITIN) 10 MG tablet Take 10 mg by mouth as needed for allergies.      . metoprolol tartrate (LOPRESSOR) 12.5 mg TABS tablet Take 1.5 tablets (37.5 mg total) by mouth 2 (two) times daily.  90 tablet  1  . ONETOUCH DELICA LANCETS FINE MISC 1 each by Does not apply route as directed. Check blood sugar daily as directed, DX 250.02 HOLD UNTIL PATIENT REQUEST  100 each  5  . oxyCODONE (OXY IR/ROXICODONE) 5 MG immediate release tablet Take 1-2 tablets (5-10 mg total) by mouth every 3 (three) hours as needed for severe pain.  30 tablet  0   No current facility-administered medications for this visit.    No Known Allergies  History   Social History  . Marital Status: Married    Spouse Name: N/A    Number of Children: N/A  . Years of Education: N/A   Occupational History  .  Not on file.   Social History Main Topics  . Smoking status: Never Smoker   . Smokeless tobacco: Not on file  . Alcohol Use: Yes     Comment: Occasionally   . Drug Use: No  . Sexual Activity: Not on file   Other Topics Concern  . Not on file   Social History Narrative  . No narrative on file     Review of Systems: General: negative for chills, fever, night sweats or weight changes.  Cardiovascular: negative for chest pain, dyspnea on exertion, edema,  orthopnea, palpitations, paroxysmal nocturnal dyspnea or shortness of breath Dermatological: negative for rash Respiratory: negative for cough or wheezing Urologic: negative for hematuria Abdominal: negative for nausea, vomiting, diarrhea, bright red blood per rectum, melena, or hematemesis Neurologic: negative for visual changes, syncope, or dizziness All other systems reviewed and are otherwise negative except as noted above.    Blood pressure 124/68, pulse 95, height 5\' 2"  (1.575 m), weight 136 lb (61.689 kg).  General appearance: alert, cooperative and no distress Neck: no carotid bruit and no JVD Lungs: clear to auscultation bilaterally Heart: regular rate and rhythm, S1, S2 normal, no murmur, click, rub or gallop Extremities: no LEE Pulses: 2+ and symmetric Skin: warm and dry Neurologic: Grossly normal  EKG Not performed  ASSESSMENT AND PLAN:   1. CAD: Status post CABG x3 (LIMA-LAD, SVG-OM2, SVG-dRCA). EF 40-45%. She denies any recurrent anginal symptoms no dyspnea or signs of acute heart failure. Continue aspirin, beta blocker, ACE inhibitor and statin. Her pulse rate today is 95 bpm and blood pressure is 124/68. We will further increase her beta blocker to 50 mg twice a day for better rate control.   2. Diabetes: Well controlled. Her hemoglobin A1c has improved from 10 down to 6.2 over the course of one year. She is not currently on any medications. Her diabetes is controlled through diet and exercise. This will continue to be followed by her primary care provider, Dr. Linna Logan.    PLAN Continue current medications for CAD. As mentioned above, we will further increase her Lopressor to 50 mg twice a day. She is scheduled for post surgical followup with Dr. Servando Logan on 01/08/2014. I recommended that she follow with her primary cardiologist, Dr. Debara Logan, in 4-6 weeks for repeat assessment.   Kathryn Logan, Kathryn Logan 12/18/2013 3:13 PM

## 2014-01-06 ENCOUNTER — Other Ambulatory Visit: Payer: Self-pay | Admitting: Cardiothoracic Surgery

## 2014-01-06 DIAGNOSIS — I214 Non-ST elevation (NSTEMI) myocardial infarction: Secondary | ICD-10-CM

## 2014-01-07 ENCOUNTER — Other Ambulatory Visit: Payer: Self-pay | Admitting: Cardiothoracic Surgery

## 2014-01-07 DIAGNOSIS — I214 Non-ST elevation (NSTEMI) myocardial infarction: Secondary | ICD-10-CM

## 2014-01-08 ENCOUNTER — Ambulatory Visit: Payer: Medicare Other | Admitting: Cardiothoracic Surgery

## 2014-01-12 ENCOUNTER — Ambulatory Visit
Admission: RE | Admit: 2014-01-12 | Discharge: 2014-01-12 | Disposition: A | Discharge status: Home / Self Care | Payer: Medicare Other | Source: Ambulatory Visit | Attending: Cardiothoracic Surgery | Admitting: Cardiothoracic Surgery

## 2014-01-12 ENCOUNTER — Ambulatory Visit (INDEPENDENT_AMBULATORY_CARE_PROVIDER_SITE_OTHER): Payer: Self-pay | Admitting: Physician Assistant

## 2014-01-12 VITALS — BP 118/81 | HR 85 | Ht 62.0 in | Wt 136.0 lb

## 2014-01-12 DIAGNOSIS — R000 Tachycardia, unspecified: Secondary | ICD-10-CM

## 2014-01-12 DIAGNOSIS — R9389 Abnormal findings on diagnostic imaging of other specified body structures: Secondary | ICD-10-CM | POA: Diagnosis not present

## 2014-01-12 DIAGNOSIS — I214 Non-ST elevation (NSTEMI) myocardial infarction: Secondary | ICD-10-CM

## 2014-01-12 MED ORDER — METOPROLOL TARTRATE 50 MG PO TABS: 50.0000 mg | ORAL_TABLET | Freq: Two times a day (BID) | ORAL | Status: DC

## 2014-01-12 NOTE — Progress Notes (Signed)
[address]       [place] [address]             [id number]          HPI: Patient returns for routine postoperative follow-up having undergone CABG x 3 by Dr. Servando Snare on 12/01/2013. The patient's postoperative course was notable for tachycardia, but was otherwise uneventful.  She was discharged home on 12/06/2013 in good condition.   Since hospital discharge, the patient has continued to progress well.  She is having no chest soreness and has completely stopped taking the pain medication.  She has been seen by the cardiology PA and was noted to still be mildly tachycardic, therefore her beta blocker dose was increased.  She seems to be tolerating this well.  She denies chest pain, shortness of breath, or lower extremity edema.  The patient has also seen her medical doctor and because her blood sugars have remained low and her A1C has decreased over time, she has been taken off all diabetes medications.    Current Outpatient Prescriptions  Medication Sig Dispense Refill  . aspirin EC 81 MG tablet Take 81 mg by mouth daily.       [name] atorvastatin (LIPITOR) 80 MG tablet Take 1 tablet (80 mg total) by mouth daily.  30 tablet  1  . BIOTIN PO Take 2,500 mcg by mouth daily.       . bisacodyl (DULCOLAX) 5 MG EC tablet Take 5 mg by mouth. Tuesday and Wednesday      . Docusate Calcium (STOOL SOFTENER PO) Take by mouth as needed.      [name] glucose blood (ONE TOUCH ULTRA TEST) test strip Check blood sugar daily as directed, DX 250.02  100 each  12  . lanolin-mineral oil (BABY OIL) OIL Apply 1 application topically every other day.      . lisinopril (PRINIVIL,ZESTRIL) 2.5 MG tablet Take 1 tablet (2.5 mg total) by mouth daily.  30 tablet  1  . loratadine (CLARITIN) 10 MG tablet Take 10 mg by mouth as needed for allergies.      . metoprolol tartrate (LOPRESSOR) 50 MG tablet Take 1 tablet (50 mg total) by mouth 2 (two) times daily.  60 tablet  1  . ONETOUCH DELICA LANCETS FINE MISC  1 each by Does not apply route as directed. Check blood sugar daily as directed, DX 250.02 HOLD UNTIL PATIENT REQUEST  100 each  5  . oxyCODONE (OXY IR/ROXICODONE) 5 MG immediate release tablet Take 1-2 tablets (5-10 mg total) by mouth every 3 (three) hours as needed for severe pain.  30 tablet  0   No current facility-administered medications for this visit.     Physical Exam: BP 118/81 HR 85 Wounds: Sternal and leg incisions all well healed. One left chest tube suture is in place and is removed without difficulty.  No erythema or drainage.  Sternum is stable to palpation. Heart: regular rate and rhythm Lungs: Clear to auscultation Extremities: No lower extremity edema   Diagnostic Tests: Chest xray: Dg Chest 2 View  01/12/2014   CLINICAL DATA:  Acute MI.  EXAM: CHEST  2 VIEW  COMPARISON:  12/04/2013  FINDINGS: Sternotomy wires are unchanged. Lungs are adequately inflated without focal airspace consolidation or effusion. Cardiomediastinal silhouette and remainder of the exam is unchanged.  IMPRESSION: No active cardiopulmonary disease.   Electronically Signed   By: Marin Olp M.D.   On: 01/12/2014 13:39  Assessment/Plan: Kathryn Logan is doing well status post CABG x 3.  She has been seen by cardiology and her primary MD and is overall progressing well.  Her beta blocker dose was increased for tachycardia, and today her heart rate is controlled at 85.  She is running low on her Lopressor, and I have given her a new prescription for Lopressor 50 mg po bid #60 with 1 refill.  She may begin driving at this point and increase her activity as tolerated.  She has been contacted by cardiac rehab and plans to begin the program next week.  We will see her back in 4-6 weeks for a recheck and she may call in the interim if she has any problems.

## 2014-01-15 ENCOUNTER — Encounter (HOSPITAL_COMMUNITY)
Admission: RE | Admit: 2014-01-15 | Discharge: 2014-01-15 | Disposition: A | Discharge status: Home / Self Care | Payer: Medicare Other | Source: Ambulatory Visit | Attending: Internal Medicine | Admitting: Internal Medicine

## 2014-01-15 DIAGNOSIS — Z951 Presence of aortocoronary bypass graft: Secondary | ICD-10-CM | POA: Insufficient documentation

## 2014-01-15 DIAGNOSIS — I2510 Atherosclerotic heart disease of native coronary artery without angina pectoris: Secondary | ICD-10-CM | POA: Insufficient documentation

## 2014-01-15 NOTE — Progress Notes (Signed)
Cardiac Rehab Medication Review by a Pharmacist  Does the patient  feel that his/her medications are working for him/her?  yes  Has the patient been experiencing any side effects to the medications prescribed?  no  Does the patient measure his/her own blood pressure or blood glucose at home?  Yes. Checks blood glucose at home. Pt reports last A1c was around 6%. CBGs well controlled currently  Does the patient have any problems obtaining medications due to transportation or finances?   no  Understanding of regimen: good Understanding of indications: good Potential of compliance: good   Pharmacist comments: Pt reports compliance with medications and is doing quite well. No complaints at this time.  Reginia Naas 01/15/2014 9:08 AM

## 2014-01-21 ENCOUNTER — Ambulatory Visit (HOSPITAL_COMMUNITY): Payer: Medicare Other

## 2014-01-21 ENCOUNTER — Encounter (HOSPITAL_COMMUNITY)
Admission: RE | Admit: 2014-01-21 | Discharge: 2014-01-21 | Disposition: A | Discharge status: Home / Self Care | Payer: Medicare Other | Source: Ambulatory Visit | Attending: Internal Medicine | Admitting: Internal Medicine

## 2014-01-21 DIAGNOSIS — Z951 Presence of aortocoronary bypass graft: Secondary | ICD-10-CM | POA: Diagnosis not present

## 2014-01-21 DIAGNOSIS — I2510 Atherosclerotic heart disease of native coronary artery without angina pectoris: Secondary | ICD-10-CM | POA: Diagnosis not present

## 2014-01-21 LAB — GLUCOSE, CAPILLARY
Glucose-Capillary: 108 mg/dL — ABNORMAL HIGH (ref 70–99)
Glucose-Capillary: 98 mg/dL (ref 70–99)

## 2014-01-21 NOTE — Progress Notes (Signed)
Pt in today for first day of exercise in the phase II cardiac rehab program at the 11:15 exercise.  Pt tolerated light exercise with no complaints. Monitor showed SR with no noted ectopy.  Pt pre and post blood sugars obtained due to prior history of diabetes.  Pt reports she checks her blood glucose every day. Medication list reconciled.  PHQ2 score 0.  Pt with supportive family and feels positive about her recovery.  Pt goals is to increase stamina.  Will monitor Met levels and review home exercise.  Long term goal is to be able to walk for 60 minutes.  Will monitor her progress toward this achievable goal. Maurice Small RN, BSN

## 2014-01-23 ENCOUNTER — Ambulatory Visit (HOSPITAL_COMMUNITY): Payer: Medicare Other

## 2014-01-23 ENCOUNTER — Encounter (HOSPITAL_COMMUNITY)
Admission: RE | Admit: 2014-01-23 | Discharge: 2014-01-23 | Disposition: A | Discharge status: Home / Self Care | Payer: Medicare Other | Source: Ambulatory Visit | Attending: Internal Medicine | Admitting: Internal Medicine

## 2014-01-23 DIAGNOSIS — I2510 Atherosclerotic heart disease of native coronary artery without angina pectoris: Secondary | ICD-10-CM | POA: Diagnosis not present

## 2014-01-26 ENCOUNTER — Other Ambulatory Visit: Payer: Self-pay | Admitting: Physician Assistant

## 2014-01-26 ENCOUNTER — Ambulatory Visit (HOSPITAL_COMMUNITY): Payer: Medicare Other

## 2014-01-26 ENCOUNTER — Encounter (HOSPITAL_COMMUNITY)
Admission: RE | Admit: 2014-01-26 | Discharge: 2014-01-26 | Disposition: A | Discharge status: Home / Self Care | Payer: Medicare Other | Source: Ambulatory Visit | Attending: Internal Medicine | Admitting: Internal Medicine

## 2014-01-26 DIAGNOSIS — I2510 Atherosclerotic heart disease of native coronary artery without angina pectoris: Secondary | ICD-10-CM | POA: Diagnosis not present

## 2014-01-27 ENCOUNTER — Other Ambulatory Visit: Payer: Self-pay

## 2014-01-27 DIAGNOSIS — E785 Hyperlipidemia, unspecified: Secondary | ICD-10-CM

## 2014-01-27 MED ORDER — ATORVASTATIN CALCIUM 80 MG PO TABS: 80.0000 mg | ORAL_TABLET | Freq: Every day | ORAL | Status: DC

## 2014-01-27 NOTE — Telephone Encounter (Signed)
rx sent to pharmacy

## 2014-01-28 ENCOUNTER — Encounter (HOSPITAL_COMMUNITY)
Admission: RE | Admit: 2014-01-28 | Discharge: 2014-01-28 | Disposition: A | Discharge status: Home / Self Care | Payer: Medicare Other | Source: Ambulatory Visit | Attending: Internal Medicine | Admitting: Internal Medicine

## 2014-01-28 ENCOUNTER — Ambulatory Visit (HOSPITAL_COMMUNITY): Payer: Medicare Other

## 2014-01-28 DIAGNOSIS — I2510 Atherosclerotic heart disease of native coronary artery without angina pectoris: Secondary | ICD-10-CM | POA: Diagnosis not present

## 2014-01-28 NOTE — Progress Notes (Signed)
Reviewed home exercise with pt today.  Pt plans to walk at mall for exercise.  Reviewed THR, pulse, RPE, sign and symptoms,and when to call 911 or MD.  Pt voiced understanding. Alberteen Sam, MA, ACSM RCEP

## 2014-01-30 ENCOUNTER — Ambulatory Visit (HOSPITAL_COMMUNITY): Payer: Medicare Other

## 2014-01-30 ENCOUNTER — Encounter (HOSPITAL_COMMUNITY)
Admission: RE | Admit: 2014-01-30 | Discharge: 2014-01-30 | Disposition: A | Discharge status: Home / Self Care | Payer: Medicare Other | Source: Ambulatory Visit | Attending: Internal Medicine | Admitting: Internal Medicine

## 2014-01-30 DIAGNOSIS — I2510 Atherosclerotic heart disease of native coronary artery without angina pectoris: Secondary | ICD-10-CM | POA: Diagnosis not present

## 2014-02-02 ENCOUNTER — Other Ambulatory Visit: Payer: Self-pay | Admitting: Physician Assistant

## 2014-02-02 ENCOUNTER — Ambulatory Visit (INDEPENDENT_AMBULATORY_CARE_PROVIDER_SITE_OTHER): Payer: Medicare Other | Admitting: Internal Medicine

## 2014-02-02 ENCOUNTER — Encounter (HOSPITAL_COMMUNITY)
Admission: RE | Admit: 2014-02-02 | Discharge: 2014-02-02 | Disposition: A | Discharge status: Home / Self Care | Payer: Medicare Other | Source: Ambulatory Visit | Attending: Internal Medicine | Admitting: Internal Medicine

## 2014-02-02 ENCOUNTER — Encounter: Payer: Self-pay | Admitting: Internal Medicine

## 2014-02-02 ENCOUNTER — Ambulatory Visit (HOSPITAL_COMMUNITY): Payer: Medicare Other

## 2014-02-02 VITALS — BP 100/66 | HR 88 | Ht 62.0 in | Wt 132.3 lb

## 2014-02-02 DIAGNOSIS — IMO0001 Reserved for inherently not codable concepts without codable children: Secondary | ICD-10-CM | POA: Diagnosis not present

## 2014-02-02 DIAGNOSIS — I2510 Atherosclerotic heart disease of native coronary artery without angina pectoris: Secondary | ICD-10-CM

## 2014-02-02 DIAGNOSIS — Z951 Presence of aortocoronary bypass graft: Secondary | ICD-10-CM | POA: Diagnosis not present

## 2014-02-02 DIAGNOSIS — E785 Hyperlipidemia, unspecified: Secondary | ICD-10-CM | POA: Diagnosis not present

## 2014-02-02 DIAGNOSIS — E1165 Type 2 diabetes mellitus with hyperglycemia: Secondary | ICD-10-CM

## 2014-02-02 NOTE — Patient Instructions (Signed)
Your physician wants you to follow-up in: 6 months with Dr. Hilty. You will receive a reminder letter in the mail two months in advance. If you don't receive a letter, please call our office to schedule the follow-up appointment.    

## 2014-02-02 NOTE — Progress Notes (Signed)
OFFICE NOTE  Chief Complaint:  Feels well - occasional low BP at CR  Primary Care Physician: Unice Cobble, MD  HPI:  Kathryn Logan is a 68 year old female with a h/o diet controlled DM but no prior cardiac history who presented to [facility] on 11/26/2013 with chest discomfort with associated nausea and diaphoresis. EKG demonstrated nonspecific T-wave abnormalities. Initial cardiac enzymes were elevated and she ruled in for non-ST elevation myocardial infarction. Subsequently, she underwent a left heart catheterization, perform by Dr. Tamala Julian, which revealed severe coronary artery disease involving the proximal LAD, the first and second marginal branches and a nondominant right coronary artery. She was found to have moderate to severe left ventricular systolic dysfunction with an estimated ejection fraction of 30-35%. However, post-cath 2-D echocardiogram revealed an EF of 40-45%. Surgical revascularization was recommended for her CAD. She ultimately underwent CABG x3 by Dr. Servando Snare on 12/01/2048. She received a LIMA-LAD, SVG-OM2, SVG-dRCA. She had an uncomplicated postoperative course. She was discharged home on 12/05/2013. She was discharged home on aspirin, metoprolol, lisinopril and atorvastatin.  Kathryn Logan was recently seen by Ellen Henri, PA-C., who recommended increasing her beta blocker due to tachycardia. Recently she's been having problems with hypotension, mostly in the morning at cardiac rehabilitation. She is generally asymptomatic with this.  PMHx:  Past Medical History  Diagnosis Date  . Osteopenia   . Hyperlipidemia   . Diabetes mellitus without complication     Past Surgical History  Procedure Laterality Date  . Tonsillectomy and adenoidectomy    . G 1 p 1    . No colonoscopy      [place] reviewed  . Coronary artery bypass graft N/A 12/01/2013    Procedure: CORONARY ARTERY BYPASS GRAFTING (CABG) x three, using left internal mammary artery, and right  leg saphenous vein harvested endoscopically (LIMA to LAD, SVG to OM2 , SVG to dRCA);  Surgeon: Grace Isaac, MD;  Location: [facility];  Service: Open Heart Surgery;  Laterality: N/A;  . Intraoperative transesophageal echocardiogram N/A 12/01/2013    Procedure: INTRAOPERATIVE TRANSESOPHAGEAL ECHOCARDIOGRAM;  Surgeon: Grace Isaac, MD;  Location: [facility];  Service: Open Heart Surgery;  Laterality: N/A;    FAMHx:  Family History  Problem Relation Age of Onset  . Heart attack Mother 1  . Diabetes Mother   . Stroke Mother   . Hypertension Mother   . Goiter Mother   . Heart attack Father     >55  . Bladder Cancer Father   . Parkinson's disease Maternal Aunt   . Osteoporosis Sister   . Diabetes Brother     SOCHx:   reports that she has never smoked. She does not have any smokeless tobacco history on file. She reports that she drinks alcohol. She reports that she does not use illicit drugs.  ALLERGIES:  No Known Allergies  ROS: A comprehensive review of systems was negative except for: hypotension  HOME MEDS: Current Outpatient Prescriptions  Medication Sig Dispense Refill  . aspirin EC 81 MG tablet Take 81 mg by mouth daily.       [name] atorvastatin (LIPITOR) 80 MG tablet Take 1 tablet (80 mg total) by mouth daily.  30 tablet  11  . BIOTIN PO Take 2,500 mcg by mouth daily.       . bisacodyl (DULCOLAX) 5 MG EC tablet Take 5 mg by mouth daily as needed for moderate constipation.       Mariane Baumgarten Calcium (STOOL SOFTENER PO) Take  by mouth as needed.      [name] glucose blood (ONE TOUCH ULTRA TEST) test strip Check blood sugar daily as directed, DX 250.02  100 each  12  . lanolin-mineral oil (BABY OIL) OIL Apply 1 application topically every other day.      . lisinopril (PRINIVIL,ZESTRIL) 2.5 MG tablet Take 2.5 mg by mouth at bedtime.      [name] loratadine (CLARITIN) 10 MG tablet Take 10 mg by mouth as needed for allergies.      . metoprolol tartrate (LOPRESSOR) 50 MG tablet Take 1 tablet (50 mg  total) by mouth 2 (two) times daily.  60 tablet  1  . ONETOUCH DELICA LANCETS FINE MISC 1 each by Does not apply route as directed. Check blood sugar daily as directed, DX 250.02 HOLD UNTIL PATIENT REQUEST  100 each  5  . oxyCODONE (OXY IR/ROXICODONE) 5 MG immediate release tablet Take 1-2 tablets (5-10 mg total) by mouth every 3 (three) hours as needed for severe pain.  30 tablet  0   No current facility-administered medications for this visit.    LABS/IMAGING: No results found for this or any previous visit (from the past 48 hour(s)). No results found.  VITALS: BP 100/66  Pulse 88  Ht 5\' 2"  (1.575 m)  Wt 132 lb 4.8 oz (60.011 kg)  BMI 24.19 kg/m2  EXAM: General appearance: alert and no distress Neck: no carotid bruit and no JVD Lungs: clear to auscultation bilaterally Heart: regular rate and rhythm, S1, S2 normal, no murmur, click, rub or gallop Abdomen: soft, non-tender; bowel sounds normal; no masses,  no organomegaly Extremities: extremities normal, atraumatic, no cyanosis or edema Pulses: 2+ and symmetric Skin: Skin color, texture, turgor normal. No rashes or lesions Neurologic: Grossly normal Psych: Anxious  EKG: deferred  ASSESSMENT: 1. Coronary artery disease status post three-vessel CABG 2. DM2 3. Dyslipidemia  PLAN: 1.   Kathryn Logan continues to recover from three-vessel bypass. She has had some low blood pressures which may be related to increases in her metoprolol. This was primarily due to ongoing tachycardia. She also takes lisinopril. I've recommended that she move her lisinopril dose to take in the evening as it may help with some of her a.m. hypotension. If she continues to have systolic blood pressures below 100, then she may need to discontinue lisinopril altogether. Plan to see her back in 6 months or sooner as necessary.  Pixie Casino, MD, [facility] Attending Cardiologist [facility]  Caitlain Tweed C 02/02/2014, 3:43 PM

## 2014-02-03 ENCOUNTER — Other Ambulatory Visit: Payer: Self-pay | Admitting: *Deleted

## 2014-02-03 MED ORDER — LISINOPRIL 2.5 MG PO TABS: 2.5000 mg | ORAL_TABLET | Freq: Every day | ORAL | Status: DC

## 2014-02-04 ENCOUNTER — Ambulatory Visit (HOSPITAL_COMMUNITY): Payer: Medicare Other

## 2014-02-04 ENCOUNTER — Encounter (HOSPITAL_COMMUNITY)
Admission: RE | Admit: 2014-02-04 | Discharge: 2014-02-04 | Disposition: A | Discharge status: Home / Self Care | Payer: Medicare Other | Source: Ambulatory Visit | Attending: Internal Medicine | Admitting: Internal Medicine

## 2014-02-04 DIAGNOSIS — I2510 Atherosclerotic heart disease of native coronary artery without angina pectoris: Secondary | ICD-10-CM | POA: Diagnosis not present

## 2014-02-06 ENCOUNTER — Encounter (HOSPITAL_COMMUNITY)
Admission: RE | Admit: 2014-02-06 | Discharge: 2014-02-06 | Disposition: A | Discharge status: Home / Self Care | Payer: Medicare Other | Source: Ambulatory Visit | Attending: Internal Medicine | Admitting: Internal Medicine

## 2014-02-06 ENCOUNTER — Ambulatory Visit (HOSPITAL_COMMUNITY): Payer: Medicare Other

## 2014-02-06 DIAGNOSIS — I2510 Atherosclerotic heart disease of native coronary artery without angina pectoris: Secondary | ICD-10-CM | POA: Diagnosis not present

## 2014-02-06 NOTE — Progress Notes (Signed)
Kathryn Logan 68 y.o. female Nutrition Note Spoke with pt.  Nutrition Plan and Nutrition Survey goals reviewed with pt. Pt is following Step 2 of the Therapeutic Lifestyle Changes diet. Pt is diabetic. Last A1c indicates blood glucose well-controlled. Pt checks CBG's daily and alternates pre-prandial and post-prandial CBG checks per MD recommendation. Per pt, CBG's run between 119-137 mg/dL whether they are pre- or post-prandial checks. This Probation officer went over Diabetes Education test results. Pt expressed understanding of the information reviewed. Pt aware of nutrition education classes offered. Lab Results  Component Value Date   HGBA1C 6.1* 11/30/2013   Nutrition Diagnosis   Food-and nutrition-related knowledge deficit related to lack of exposure to information as related to diagnosis of: ? CVD ? DM (A1c 6.1)  Nutrition RX/ Estimated Daily Nutrition Needs for: wt loss (pt desires) 1200 Kcal, 30 gm fat, 8 gm sat fat, 1.1 gm trans-fat, <1500 mg sodium, 150 gm CHO   Nutrition Intervention   Pt's individual nutrition plan reviewed with pt.   Benefits of adopting Therapeutic Lifestyle Changes discussed when Medficts reviewed.   Pt to attend the Portion Distortion class and Diabetes Q & A class   Pt given handouts for: ? Nutrition I class ? Nutrition II class ? Diabetes Blitz class   Continue client-centered nutrition education by RD, as part of interdisciplinary care. Goal(s)   Pt to identify food quantities necessary to achieve: ? wt loss to a goal wt of 116-126 lb (53.1-57.2 kg) at graduation from cardiac rehab.  Monitor and Evaluate progress toward nutrition goal with team. Nutrition Risk:  Change to Moderate Derek Mound, M.Ed, RD, LDN, CDE 02/06/2014 11:59 AM

## 2014-02-09 ENCOUNTER — Ambulatory Visit (HOSPITAL_COMMUNITY): Payer: Medicare Other

## 2014-02-09 ENCOUNTER — Encounter (HOSPITAL_COMMUNITY): Payer: Medicare Other

## 2014-02-11 ENCOUNTER — Encounter (HOSPITAL_COMMUNITY)
Admission: RE | Admit: 2014-02-11 | Discharge: 2014-02-11 | Disposition: A | Discharge status: Home / Self Care | Payer: Medicare Other | Source: Ambulatory Visit | Attending: Internal Medicine | Admitting: Internal Medicine

## 2014-02-11 ENCOUNTER — Ambulatory Visit (HOSPITAL_COMMUNITY): Payer: Medicare Other

## 2014-02-11 DIAGNOSIS — I2510 Atherosclerotic heart disease of native coronary artery without angina pectoris: Secondary | ICD-10-CM | POA: Diagnosis not present

## 2014-02-11 NOTE — Progress Notes (Signed)
[address]       [place] [address]             [phone]      Gayathri M [place]ton [place] Medical Record #[id number] Date of Birth: [birth year]  Referring: Dr Debara Pickett Primary Care: Unice Cobble, MD  Chief Complaint:   POST OP FOLLOW UP 12/01/2013  OPERATIVE REPORT  PREOPERATIVE DIAGNOSIS: Coronary occlusive disease with recent  subendocardial myocardial infarction with depressed LV function.  POSTOPERATIVE DIAGNOSIS: Coronary occlusive disease with recent  subendocardial myocardial infarction with depressed LV function.  SURGICAL PROCEDURE: Coronary artery bypass grafting x3 with the left  internal mammary to the left anterior descending coronary artery,  reverse saphenous vein graft to the second obtuse marginal coronary  artery, reverse saphenous vein graft to the right coronary artery with  right leg Endovein harvesting calf and thigh.  SURGEON: Lanelle Bal, MD  History of Present Illness:     Patient doing well postoperatively, without evidence of recurrent angina or congestive heart failure. She's actively involved in cardiac rehabilitation.      Past Medical History  Diagnosis Date  . Osteopenia   . Hyperlipidemia   . Diabetes mellitus without complication      History  Smoking status  . Never Smoker   Smokeless tobacco  . Not on file    History  Alcohol Use  . Yes    Comment: Occasionally      No Known Allergies  Current Outpatient Prescriptions  Medication Sig Dispense Refill  . aspirin EC 81 MG tablet Take 81 mg by mouth daily.       [name] atorvastatin (LIPITOR) 80 MG tablet Take 1 tablet (80 mg total) by mouth daily.  30 tablet  11  . BIOTIN PO Take 2,500 mcg by mouth daily.       . bisacodyl (DULCOLAX) 5 MG EC tablet Take 5 mg by mouth daily as needed for moderate constipation.       Mariane Baumgarten Calcium (STOOL SOFTENER PO) Take by mouth as needed.      [name] glucose blood (ONE TOUCH ULTRA TEST) test strip Check blood  sugar daily as directed, DX 250.02  100 each  12  . lanolin-mineral oil (BABY OIL) OIL Apply 1 application topically every other day.      . lisinopril (PRINIVIL,ZESTRIL) 2.5 MG tablet Take 1 tablet (2.5 mg total) by mouth at bedtime.  30 tablet  11  . loratadine (CLARITIN) 10 MG tablet Take 10 mg by mouth as needed for allergies.      . metoprolol tartrate (LOPRESSOR) 50 MG tablet Take 1 tablet (50 mg total) by mouth 2 (two) times daily.  60 tablet  1  . ONETOUCH DELICA LANCETS FINE MISC 1 each by Does not apply route as directed. Check blood sugar daily as directed, DX 250.02 HOLD UNTIL PATIENT REQUEST  100 each  5  . oxyCODONE (OXY IR/ROXICODONE) 5 MG immediate release tablet Take 1-2 tablets (5-10 mg total) by mouth every 3 (three) hours as needed for severe pain.  30 tablet  0   No current facility-administered medications for this visit.     Physical Exam: BP 126/85  Pulse 92  Resp 20  Ht 5\' 2"  (1.575 m)  Wt 132 lb (59.875 kg)  BMI 24.14 kg/m2  SpO2 98%  General appearance: alert and cooperative Neurologic: intact Heart: regular rate and rhythm, S1, S2 normal, no murmur, click, rub or gallop Lungs: clear to  auscultation bilaterally and normal percussion bilaterally Abdomen: soft, non-tender; bowel sounds normal; no masses,  no organomegaly Extremities: extremities normal, atraumatic, no cyanosis or edema and Homans sign is negative, no sign of DVT Wound: Sternum is stable and well-healed   Diagnostic Studies & Laboratory data:     Recent Radiology Findings:  CLINICAL DATA: Acute MI.  EXAM:  CHEST 2 VIEW  COMPARISON: 12/04/2013  FINDINGS:  Sternotomy wires are unchanged. Lungs are adequately inflated  without focal airspace consolidation or effusion. Cardiomediastinal  silhouette and remainder of the exam is unchanged.  IMPRESSION:  No active cardiopulmonary disease.  Electronically Signed  By: Marin Olp M.D.  On: 01/12/2014 13:39  Recent Lab Findings: Lab  Results  Component Value Date   WBC 13.2* 12/04/2013   HGB 10.4* 12/04/2013   HCT 32.0* 12/04/2013   PLT 186 12/04/2013   GLUCOSE 110* 12/04/2013   CHOL 138 11/27/2013   TRIG 77 11/27/2013   HDL 54 11/27/2013   LDLDIRECT 179.1 03/04/2013   LDLCALC 69 11/27/2013   ALT 19 11/30/2013   AST 24 11/30/2013   NA 140 12/04/2013   K 4.4 12/04/2013   CL 104 12/04/2013   CREATININE 0.60 12/04/2013   BUN 18 12/04/2013   CO2 22 12/04/2013   TSH 3.470 11/26/2013   INR 1.38 12/01/2013   HGBA1C 6.1* 11/30/2013      Assessment / Plan:      Patient stable after coronary artery bypass grafting July 2015. She is closely followed by cardiology. She is continuing her statin, aspirin, ACE inhibitor, and following her blood glucoses. I've not made a return appointment would be glad to see her anytime     Grace Isaac MD      [address] [place] [address] Office [phone]   Beeper [phone]  02/12/2014 10:18 AM

## 2014-02-12 ENCOUNTER — Ambulatory Visit (INDEPENDENT_AMBULATORY_CARE_PROVIDER_SITE_OTHER): Payer: Self-pay | Admitting: Cardiothoracic Surgery

## 2014-02-12 ENCOUNTER — Encounter: Payer: Self-pay | Admitting: Cardiothoracic Surgery

## 2014-02-12 VITALS — BP 126/85 | HR 92 | Resp 20 | Ht 62.0 in | Wt 132.0 lb

## 2014-02-12 DIAGNOSIS — R000 Tachycardia, unspecified: Secondary | ICD-10-CM

## 2014-02-12 DIAGNOSIS — Z951 Presence of aortocoronary bypass graft: Secondary | ICD-10-CM

## 2014-02-12 DIAGNOSIS — I25119 Atherosclerotic heart disease of native coronary artery with unspecified angina pectoris: Secondary | ICD-10-CM

## 2014-02-13 ENCOUNTER — Encounter (HOSPITAL_COMMUNITY)
Admission: RE | Admit: 2014-02-13 | Discharge: 2014-02-13 | Disposition: A | Discharge status: Home / Self Care | Payer: Medicare Other | Source: Ambulatory Visit | Attending: Internal Medicine | Admitting: Internal Medicine

## 2014-02-13 ENCOUNTER — Ambulatory Visit (HOSPITAL_COMMUNITY): Payer: Medicare Other

## 2014-02-13 DIAGNOSIS — Z951 Presence of aortocoronary bypass graft: Secondary | ICD-10-CM | POA: Diagnosis not present

## 2014-02-16 ENCOUNTER — Ambulatory Visit (HOSPITAL_COMMUNITY): Payer: Medicare Other

## 2014-02-16 ENCOUNTER — Encounter (HOSPITAL_COMMUNITY)
Admission: RE | Admit: 2014-02-16 | Discharge: 2014-02-16 | Disposition: A | Discharge status: Home / Self Care | Payer: Medicare Other | Source: Ambulatory Visit | Attending: Internal Medicine | Admitting: Internal Medicine

## 2014-02-16 DIAGNOSIS — Z951 Presence of aortocoronary bypass graft: Secondary | ICD-10-CM | POA: Diagnosis not present

## 2014-02-18 ENCOUNTER — Ambulatory Visit (HOSPITAL_COMMUNITY): Payer: Medicare Other

## 2014-02-18 ENCOUNTER — Encounter (HOSPITAL_COMMUNITY)
Admission: RE | Admit: 2014-02-18 | Discharge: 2014-02-18 | Disposition: A | Discharge status: Home / Self Care | Payer: Medicare Other | Source: Ambulatory Visit | Attending: Internal Medicine | Admitting: Internal Medicine

## 2014-02-18 DIAGNOSIS — Z951 Presence of aortocoronary bypass graft: Secondary | ICD-10-CM | POA: Diagnosis not present

## 2014-02-20 ENCOUNTER — Encounter (HOSPITAL_COMMUNITY)
Admission: RE | Admit: 2014-02-20 | Discharge: 2014-02-20 | Disposition: A | Discharge status: Home / Self Care | Payer: Medicare Other | Source: Ambulatory Visit | Attending: Internal Medicine | Admitting: Internal Medicine

## 2014-02-20 ENCOUNTER — Ambulatory Visit (HOSPITAL_COMMUNITY): Payer: Medicare Other

## 2014-02-20 DIAGNOSIS — Z951 Presence of aortocoronary bypass graft: Secondary | ICD-10-CM | POA: Diagnosis not present

## 2014-02-23 ENCOUNTER — Encounter (HOSPITAL_COMMUNITY)
Admission: RE | Admit: 2014-02-23 | Discharge: 2014-02-23 | Disposition: A | Discharge status: Home / Self Care | Payer: Medicare Other | Source: Ambulatory Visit | Attending: Internal Medicine | Admitting: Internal Medicine

## 2014-02-23 ENCOUNTER — Ambulatory Visit (HOSPITAL_COMMUNITY): Payer: Medicare Other

## 2014-02-23 DIAGNOSIS — Z951 Presence of aortocoronary bypass graft: Secondary | ICD-10-CM | POA: Diagnosis not present

## 2014-02-25 ENCOUNTER — Encounter (HOSPITAL_COMMUNITY)
Admission: RE | Admit: 2014-02-25 | Discharge: 2014-02-25 | Disposition: A | Discharge status: Home / Self Care | Payer: Medicare Other | Source: Ambulatory Visit | Attending: Internal Medicine | Admitting: Internal Medicine

## 2014-02-25 ENCOUNTER — Ambulatory Visit (HOSPITAL_COMMUNITY): Payer: Medicare Other

## 2014-02-25 DIAGNOSIS — Z951 Presence of aortocoronary bypass graft: Secondary | ICD-10-CM | POA: Diagnosis not present

## 2014-02-27 ENCOUNTER — Encounter (HOSPITAL_COMMUNITY)
Admission: RE | Admit: 2014-02-27 | Discharge: 2014-02-27 | Disposition: A | Discharge status: Home / Self Care | Payer: Medicare Other | Source: Ambulatory Visit | Attending: Internal Medicine | Admitting: Internal Medicine

## 2014-02-27 ENCOUNTER — Ambulatory Visit (HOSPITAL_COMMUNITY): Payer: Medicare Other

## 2014-02-27 DIAGNOSIS — Z951 Presence of aortocoronary bypass graft: Secondary | ICD-10-CM | POA: Diagnosis not present

## 2014-03-02 ENCOUNTER — Ambulatory Visit (HOSPITAL_COMMUNITY): Payer: Medicare Other

## 2014-03-02 ENCOUNTER — Encounter (HOSPITAL_COMMUNITY)
Admission: RE | Admit: 2014-03-02 | Discharge: 2014-03-02 | Disposition: A | Discharge status: Home / Self Care | Payer: Medicare Other | Source: Ambulatory Visit | Attending: Internal Medicine | Admitting: Internal Medicine

## 2014-03-02 DIAGNOSIS — Z951 Presence of aortocoronary bypass graft: Secondary | ICD-10-CM | POA: Diagnosis not present

## 2014-03-02 NOTE — Progress Notes (Signed)
Pt reports to rehab staff that she had some muscular discomfort to right scapula area. Pt reported that the pain was intense and was associated with vomiting.  Pt had this "pain" episode every day for three days. Pt states that this differs from her cardiac pain prior to her bypass surgery.  Pain started with cool down exercises on Friday during tricep kick back.  Pt has appt on tomorrow with her primary MD Dr. Linna Darner.  Will fax rehab report to md for review. Cherre Huger, BSN

## 2014-03-03 ENCOUNTER — Encounter: Payer: Self-pay | Admitting: Internal Medicine

## 2014-03-03 ENCOUNTER — Ambulatory Visit (INDEPENDENT_AMBULATORY_CARE_PROVIDER_SITE_OTHER): Payer: Medicare Other | Admitting: Internal Medicine

## 2014-03-03 ENCOUNTER — Telehealth: Payer: Self-pay | Admitting: *Deleted

## 2014-03-03 VITALS — BP 102/72 | HR 91 | Temp 98.2°F | Resp 14 | Wt 129.0 lb

## 2014-03-03 DIAGNOSIS — I2510 Atherosclerotic heart disease of native coronary artery without angina pectoris: Secondary | ICD-10-CM | POA: Diagnosis not present

## 2014-03-03 DIAGNOSIS — R112 Nausea with vomiting, unspecified: Secondary | ICD-10-CM

## 2014-03-03 DIAGNOSIS — R1013 Epigastric pain: Secondary | ICD-10-CM | POA: Diagnosis not present

## 2014-03-03 DIAGNOSIS — R110 Nausea: Secondary | ICD-10-CM

## 2014-03-03 DIAGNOSIS — Z23 Encounter for immunization: Secondary | ICD-10-CM

## 2014-03-03 DIAGNOSIS — M546 Pain in thoracic spine: Secondary | ICD-10-CM

## 2014-03-03 MED ORDER — RANITIDINE HCL 150 MG PO TABS: 150.0000 mg | ORAL_TABLET | Freq: Two times a day (BID) | ORAL | Status: DC

## 2014-03-03 NOTE — Progress Notes (Signed)
Pre visit review using our clinic review tool, if applicable. No additional management support is needed unless otherwise documented below in the visit note. 

## 2014-03-03 NOTE — Telephone Encounter (Signed)
[facility] Triage Call Report Triage Record Num: [id number] Operator: Amy Maggart Patient Name: Kathryn Logan Call Date & Time: 02/27/2014 7:43:04PM Patient Phone: [phone] PCP: Unice Cobble Patient Gender: Female PCP Fax : [phone] Patient DOB: 1946/04/15 Practice Name: [facility] Reason for Call: Caller: Kathryn Logan/Patient; PCP: Unice Cobble; CB#: [phone]; Call regarding Vomiting; Onset 02/26/14. Afebrile. Caller reports vomiting x2 in past 24hrs, but is able to keep fluids down. Pt also c/o back pain d/t pulled muscle from exercise. Caller denies any other signs of illness. Triaged per Vomiting and Back Sx guidelines, homecare disp for "all other sx." Homecare advice and call back parameters given per guidelines; caller verbalized understanding. Protocol(s) Used: Nausea or Vomiting Recommended Outcome per Protocol: Provide Home/Self Care Reason for Outcome: All other situations Care Advice: Call provider immediately if develop severe pain, black, tarry stools, bloody stools, blood-streaked or coffee ground-looking vomitus, or abdomen swollen. ~ Nausea Care Advice: - Drink small amounts of clear, sweetened liquids or ice cold drinks. - Eat light, bland foods such as saltine crackers or plain bread. - Do not eat high fat, highly seasoned, high fiber, or high sugar content foods. - Avoid mixing hot food and cold foods. - Eat smaller, more frequent meals. - Rest as much as possible in a sitting or in a propped lying position. Do not lie flat for at least 2 hours after eating. - Do not take pain medication (such as aspirin, NSAIDs) while nauseated. - Rest as much as possible until symptoms improve since activity may worsen nausea. ~ Call provider within 8 hours if unable to keep any fluids down for more than 8 hours, fever over 101.5 F (38.6 C), or severe vomiting with diarrhea. ~ 10/

## 2014-03-03 NOTE — Progress Notes (Signed)
   Subjective:    Patient ID: Kathryn Logan, female    DOB: 02/10/1946, 68 y.o.   MRN: [id number]  HPI   She has had 3 separate episodes of nausea and vomiting attributed to straining her back  On 02/17/14 she was using 3 pound weights in exercise class flexing and extending right upper arm posteriorly repetitively . She questioned whether she jerked it back too fast causing acute pain in the right infrascapular area.  She described that acute pain as electric & variable in intensity .It was nonradiating.  She did take Tylenol with some response  On 02/26/14 she ate a grilled chicken wrap which had ranch dressing; several hours later she had nausea and vomiting  On 10/16 at approximately 2 PM she had salad ,talapia as well as broccoli. Between 3 and 5 she experienced back pain with nausea and vomiting  On 10/17 she ate the leftover talapia and broccoli and by 3-4 pm she was experiencing nausea and vomiting again.  She has had a purposeful 15 pound weight loss since her cardiac surgery 3 months ago.  She has no personal or family history of gallbladder disease.    Review of Systems   She has constant "burning"  across her chest subcutaneously since her cardiac surgery. This is not exertional.  She did have sweats with these episodes but denies fever or chills.  She has no dysuria, pyuria, hematuria  She has no melena, rectal bleeding, or change in her bowels. She also specifically denies urine which is Coke-colored or stool which is clay colored.  FBS now 88-135.       Objective:   Physical Exam General appearance is one of good health and nourishment w/o distress.  Eyes: No conjunctival inflammation or scleral icterus is present.  Oral exam: Dental hygiene is good; lips and gums are healthy appearing.There is no oropharyngeal erythema or exudate noted.   Heart:  Normal rate and regular rhythm. S1 and S2 normal without gallop, murmur, click, rub or other extra sounds      Lungs:Chest clear to auscultation; no wheezes, rhonchi,rales ,or rubs present.No increased work of breathing.   Abdomen: bowel sounds normal, soft and non-tender without masses, organomegaly or hernias noted.  No guarding or rebound . No tenderness over the flanks to percussion  Musculoskeletal: Able to lie flat and sit up without help. Negative straight leg raising bilaterally. Gait normal.  There is no tenderness to palpation over the area of initial pain in the right infrascapular area.  Skin:Warm & dry.  Intact without suspicious lesions or rashes ; no jaundice or tenting. Specifically there is a change in the color temperature and area of pain infrascapular area  Lymphatic: No lymphadenopathy is noted about the head, neck, axilla              Assessment & Plan:  #1 3 separate episodes of nausea and vomiting several hours after eating.; rule out gallbladder disease.  #2 history of acute muscle strain without evidence of ongoing active process clinically  #3 chronic burning in chest   #4 DM , improved control  Plan: See orders and recommendations

## 2014-03-03 NOTE — Patient Instructions (Signed)
Your next office appointment will be determined based upon review of your pendingUltrasound. Those instructions will be transmitted to you through My Chart  Followup as needed for your acute issue. Please report any significant change in your symptoms.

## 2014-03-04 ENCOUNTER — Encounter (HOSPITAL_COMMUNITY)
Admission: RE | Admit: 2014-03-04 | Discharge: 2014-03-04 | Disposition: A | Discharge status: Home / Self Care | Payer: Medicare Other | Source: Ambulatory Visit | Attending: Internal Medicine | Admitting: Internal Medicine

## 2014-03-04 ENCOUNTER — Ambulatory Visit (HOSPITAL_COMMUNITY): Payer: Medicare Other

## 2014-03-04 DIAGNOSIS — Z951 Presence of aortocoronary bypass graft: Secondary | ICD-10-CM | POA: Diagnosis not present

## 2014-03-06 ENCOUNTER — Encounter (HOSPITAL_COMMUNITY): Admission: RE | Admit: 2014-03-06 | Payer: Medicare Other | Source: Ambulatory Visit

## 2014-03-06 ENCOUNTER — Ambulatory Visit (HOSPITAL_COMMUNITY): Payer: Medicare Other

## 2014-03-09 ENCOUNTER — Encounter (HOSPITAL_COMMUNITY)
Admission: RE | Admit: 2014-03-09 | Discharge: 2014-03-09 | Disposition: A | Discharge status: Home / Self Care | Payer: Medicare Other | Source: Ambulatory Visit | Attending: Internal Medicine | Admitting: Internal Medicine

## 2014-03-09 ENCOUNTER — Ambulatory Visit (HOSPITAL_COMMUNITY): Payer: Medicare Other

## 2014-03-09 DIAGNOSIS — Z951 Presence of aortocoronary bypass graft: Secondary | ICD-10-CM | POA: Diagnosis not present

## 2014-03-10 ENCOUNTER — Ambulatory Visit
Admission: RE | Admit: 2014-03-10 | Discharge: 2014-03-10 | Disposition: A | Discharge status: Home / Self Care | Payer: Medicare Other | Source: Ambulatory Visit | Attending: Internal Medicine | Admitting: Internal Medicine

## 2014-03-10 DIAGNOSIS — K828 Other specified diseases of gallbladder: Secondary | ICD-10-CM | POA: Diagnosis not present

## 2014-03-10 DIAGNOSIS — M546 Pain in thoracic spine: Secondary | ICD-10-CM

## 2014-03-10 DIAGNOSIS — R112 Nausea with vomiting, unspecified: Secondary | ICD-10-CM

## 2014-03-10 DIAGNOSIS — N281 Cyst of kidney, acquired: Secondary | ICD-10-CM | POA: Diagnosis not present

## 2014-03-11 ENCOUNTER — Encounter (HOSPITAL_COMMUNITY)
Admission: RE | Admit: 2014-03-11 | Discharge: 2014-03-11 | Disposition: A | Discharge status: Home / Self Care | Payer: Medicare Other | Source: Ambulatory Visit | Attending: Internal Medicine | Admitting: Internal Medicine

## 2014-03-11 ENCOUNTER — Ambulatory Visit (HOSPITAL_COMMUNITY): Payer: Medicare Other

## 2014-03-11 DIAGNOSIS — Z951 Presence of aortocoronary bypass graft: Secondary | ICD-10-CM | POA: Diagnosis not present

## 2014-03-13 ENCOUNTER — Encounter (HOSPITAL_COMMUNITY)
Admission: RE | Admit: 2014-03-13 | Discharge: 2014-03-13 | Disposition: A | Discharge status: Home / Self Care | Payer: Medicare Other | Source: Ambulatory Visit | Attending: Internal Medicine | Admitting: Internal Medicine

## 2014-03-13 ENCOUNTER — Ambulatory Visit (INDEPENDENT_AMBULATORY_CARE_PROVIDER_SITE_OTHER): Payer: Medicare Other | Admitting: Internal Medicine

## 2014-03-13 ENCOUNTER — Encounter: Payer: Self-pay | Admitting: Internal Medicine

## 2014-03-13 ENCOUNTER — Ambulatory Visit (HOSPITAL_COMMUNITY): Payer: Medicare Other

## 2014-03-13 ENCOUNTER — Other Ambulatory Visit (INDEPENDENT_AMBULATORY_CARE_PROVIDER_SITE_OTHER): Payer: Medicare Other

## 2014-03-13 VITALS — BP 116/80 | HR 91 | Temp 98.4°F | Resp 12 | Wt 130.0 lb

## 2014-03-13 DIAGNOSIS — R932 Abnormal findings on diagnostic imaging of liver and biliary tract: Secondary | ICD-10-CM

## 2014-03-13 DIAGNOSIS — I2510 Atherosclerotic heart disease of native coronary artery without angina pectoris: Secondary | ICD-10-CM

## 2014-03-13 DIAGNOSIS — E1151 Type 2 diabetes mellitus with diabetic peripheral angiopathy without gangrene: Secondary | ICD-10-CM

## 2014-03-13 DIAGNOSIS — E1159 Type 2 diabetes mellitus with other circulatory complications: Secondary | ICD-10-CM

## 2014-03-13 DIAGNOSIS — Z951 Presence of aortocoronary bypass graft: Secondary | ICD-10-CM | POA: Diagnosis not present

## 2014-03-13 LAB — HEMOGLOBIN A1C: Hgb A1c MFr Bld: 5.3 % (ref 4.6–6.5)

## 2014-03-13 NOTE — Patient Instructions (Signed)
Your next office appointment will be determined based upon review of your pending labs. Those instructions will be transmitted to you through My Chart . 

## 2014-03-13 NOTE — Progress Notes (Signed)
   Subjective:    Patient ID: Kathryn Logan, female    DOB: Apr 29, 1946, 68 y.o.   MRN: [id number]  HPI   The posterior chest symptoms have resolved. The gall bladder studies had shown sludge but no stones. She also denies any residual nausea or vomiting. As the symptoms have resolved; she is not anxious to pursue gastroenterology evaluation.  She does have persistent burning @ the midsternum since her cardiac surgery. She feels this is worse when her clothes rub against the chest wall  The pain in the upper extremity has resolved with armrest.  Fasting blood sugars are in the 90s. Two-hour postprandial high is 171; most are less than 130  Her chart was reviewed. Her A1c was 8.6% in February 2015. That would be associated with an average sugar of 228 and 72% increased long-term risk  Her LDL has dropped from approximately 170 to 69,a 100% decrease in risk.      Review of Systems Polyuria, polyphagia, polydipsia absent. There is no blurred vision, double vision, or loss of vision.  Also denied are numbness, tingling, or burning of the extremities. No nonhealing skin lesions present. Weight is stable.      Objective:   Physical Exam Appears healthy and well-nourished & in no acute distress  No carotid bruits are present.No neck vein distention present at 10 - 15 degrees. Thyroid normal to palpation  Heart rhythm and rate are normal with no gallop or murmur  Chest is clear with no increased work of breathing  There is no evidence of aortic aneurysm or renal artery bruits  Abdomen soft with no organomegaly or masses. No HJR  No clubbing, cyanosis or edema present.  Pedal pulses are intact   No ischemic skin changes are present . Fingernails/ toenails healthy   Alert and oriented. Strength, tone, DTRs reflexes normal          Assessment & Plan:  #1 symptoms suggestive of gallbladder disease essentially resolved #2 diabetes, ? resolved  #3 dyslipidemia  Plan: If  symptoms recur;[place] nuclear scan of the gallbladder could be pursued.

## 2014-03-13 NOTE — Progress Notes (Signed)
   Subjective:    Patient ID: Kathryn Logan, female    DOB: Mar 17, 1946, 68 y.o.   MRN: [id number]  HPI  Patient is seen today for follow-up of gall bladder ultrasound.   Since last office visit 01/2014, she has had no further nausea or vomiting following meals.  She has had no abdominal pain.  She did not seek referral to GI because symptoms resolved.    She remains very active in cardiac rehab without CV symptoms.   She does have occasional mid-sternal burning that is exacerbated by clothes rubbing midsternal incision from CABG.   FBS at home are ranging 90's.  2 hour post prandial blood sugars are mostly <130's.    Review of Systems She denies fevers, chills, sweats, unintentional weight loss or weight gain Denies chest pain, shortness of breath, palpitations, LE edema, orthopnea, PND Denies cough, wheezing Denies nausea, vomiting, diarrhea, abdominal pain, melena      Objective:   Physical Exam  Constitutional: She is oriented to person, place, and time. She appears well-developed and well-nourished. No distress.  Eyes: Conjunctivae and EOM are normal. Pupils are equal, round, and reactive to light. Right eye exhibits no discharge. Left eye exhibits no discharge.  Neck: Normal range of motion. Neck supple. No thyromegaly present.  Cardiovascular: Normal rate, regular rhythm and normal heart sounds.   No murmur heard. Pulmonary/Chest: Effort normal and breath sounds normal. No respiratory distress. She has no wheezes.  Abdominal: Soft. Bowel sounds are normal. She exhibits no distension and no mass. There is no tenderness. There is no guarding.  Musculoskeletal: Normal range of motion. She exhibits no edema and no tenderness.  Lymphadenopathy:    She has no cervical adenopathy.  Neurological: She is alert and oriented to person, place, and time.  Skin: Skin is warm and dry. No rash noted. She is not diaphoretic. No erythema.  Psychiatric: She has a normal mood and affect.  Her behavior is normal. Judgment and thought content normal.          Assessment & Plan:

## 2014-03-13 NOTE — Progress Notes (Signed)
Pre visit review using our clinic review tool, if applicable. No additional management support is needed unless otherwise documented below in the visit note. 

## 2014-03-16 ENCOUNTER — Encounter (HOSPITAL_COMMUNITY)
Admission: RE | Admit: 2014-03-16 | Discharge: 2014-03-16 | Disposition: A | Discharge status: Home / Self Care | Payer: Medicare Other | Source: Ambulatory Visit | Attending: Internal Medicine | Admitting: Internal Medicine

## 2014-03-16 ENCOUNTER — Ambulatory Visit (HOSPITAL_COMMUNITY): Payer: Medicare Other

## 2014-03-16 DIAGNOSIS — Z951 Presence of aortocoronary bypass graft: Secondary | ICD-10-CM | POA: Diagnosis not present

## 2014-03-18 ENCOUNTER — Encounter (HOSPITAL_COMMUNITY): Payer: Medicare Other

## 2014-03-18 ENCOUNTER — Ambulatory Visit (HOSPITAL_COMMUNITY): Payer: Medicare Other

## 2014-03-18 DIAGNOSIS — Z951 Presence of aortocoronary bypass graft: Secondary | ICD-10-CM | POA: Diagnosis not present

## 2014-03-20 ENCOUNTER — Ambulatory Visit (HOSPITAL_COMMUNITY): Payer: Medicare Other

## 2014-03-20 ENCOUNTER — Encounter (HOSPITAL_COMMUNITY)
Admission: RE | Admit: 2014-03-20 | Discharge: 2014-03-20 | Disposition: A | Discharge status: Home / Self Care | Payer: Medicare Other | Source: Ambulatory Visit | Attending: Internal Medicine | Admitting: Internal Medicine

## 2014-03-20 DIAGNOSIS — Z951 Presence of aortocoronary bypass graft: Secondary | ICD-10-CM | POA: Diagnosis not present

## 2014-03-23 ENCOUNTER — Encounter (HOSPITAL_COMMUNITY)
Admission: RE | Admit: 2014-03-23 | Discharge: 2014-03-23 | Disposition: A | Discharge status: Home / Self Care | Payer: Medicare Other | Source: Ambulatory Visit | Attending: Internal Medicine | Admitting: Internal Medicine

## 2014-03-23 ENCOUNTER — Ambulatory Visit (HOSPITAL_COMMUNITY): Payer: Medicare Other

## 2014-03-23 DIAGNOSIS — Z951 Presence of aortocoronary bypass graft: Secondary | ICD-10-CM | POA: Diagnosis not present

## 2014-03-25 ENCOUNTER — Ambulatory Visit (HOSPITAL_COMMUNITY): Payer: Medicare Other

## 2014-03-25 ENCOUNTER — Encounter (HOSPITAL_COMMUNITY)
Admission: RE | Admit: 2014-03-25 | Discharge: 2014-03-25 | Disposition: A | Discharge status: Home / Self Care | Payer: Medicare Other | Source: Ambulatory Visit | Attending: Internal Medicine | Admitting: Internal Medicine

## 2014-03-25 DIAGNOSIS — Z951 Presence of aortocoronary bypass graft: Secondary | ICD-10-CM | POA: Diagnosis not present

## 2014-03-26 ENCOUNTER — Other Ambulatory Visit: Payer: Self-pay | Admitting: Physician Assistant

## 2014-03-27 ENCOUNTER — Encounter (HOSPITAL_COMMUNITY)
Admission: RE | Admit: 2014-03-27 | Discharge: 2014-03-27 | Disposition: A | Discharge status: Home / Self Care | Payer: Medicare Other | Source: Ambulatory Visit | Attending: Internal Medicine | Admitting: Internal Medicine

## 2014-03-27 ENCOUNTER — Other Ambulatory Visit: Payer: Self-pay

## 2014-03-27 ENCOUNTER — Ambulatory Visit (HOSPITAL_COMMUNITY): Payer: Medicare Other

## 2014-03-27 DIAGNOSIS — Z951 Presence of aortocoronary bypass graft: Secondary | ICD-10-CM | POA: Diagnosis not present

## 2014-03-27 DIAGNOSIS — R000 Tachycardia, unspecified: Secondary | ICD-10-CM

## 2014-03-27 MED ORDER — METOPROLOL TARTRATE 50 MG PO TABS: 50.0000 mg | ORAL_TABLET | Freq: Two times a day (BID) | ORAL | Status: DC

## 2014-03-27 NOTE — Telephone Encounter (Signed)
Rx sent to pharmacy   

## 2014-03-30 ENCOUNTER — Ambulatory Visit (HOSPITAL_COMMUNITY): Payer: Medicare Other

## 2014-03-30 ENCOUNTER — Encounter (HOSPITAL_COMMUNITY)
Admission: RE | Admit: 2014-03-30 | Discharge: 2014-03-30 | Disposition: A | Discharge status: Home / Self Care | Payer: Medicare Other | Source: Ambulatory Visit | Attending: Internal Medicine | Admitting: Internal Medicine

## 2014-03-30 DIAGNOSIS — Z951 Presence of aortocoronary bypass graft: Secondary | ICD-10-CM | POA: Diagnosis not present

## 2014-04-01 ENCOUNTER — Telehealth: Payer: Self-pay | Admitting: *Deleted

## 2014-04-01 ENCOUNTER — Encounter (HOSPITAL_COMMUNITY)
Admission: RE | Admit: 2014-04-01 | Discharge: 2014-04-01 | Disposition: A | Discharge status: Home / Self Care | Payer: Medicare Other | Source: Ambulatory Visit | Attending: Internal Medicine | Admitting: Internal Medicine

## 2014-04-01 ENCOUNTER — Ambulatory Visit (HOSPITAL_COMMUNITY): Payer: Medicare Other

## 2014-04-01 DIAGNOSIS — Z951 Presence of aortocoronary bypass graft: Secondary | ICD-10-CM | POA: Diagnosis not present

## 2014-04-01 NOTE — Telephone Encounter (Signed)
Faxed order for cardiac rehab maintenance program - OK to continue exercise @ 77-150bpm

## 2014-04-03 ENCOUNTER — Ambulatory Visit (HOSPITAL_COMMUNITY): Payer: Medicare Other

## 2014-04-03 ENCOUNTER — Encounter (HOSPITAL_COMMUNITY)
Admission: RE | Admit: 2014-04-03 | Discharge: 2014-04-03 | Disposition: A | Discharge status: Home / Self Care | Payer: Medicare Other | Source: Ambulatory Visit | Attending: Internal Medicine | Admitting: Internal Medicine

## 2014-04-03 DIAGNOSIS — Z951 Presence of aortocoronary bypass graft: Secondary | ICD-10-CM | POA: Diagnosis not present

## 2014-04-06 ENCOUNTER — Ambulatory Visit (HOSPITAL_COMMUNITY): Payer: Medicare Other

## 2014-04-06 ENCOUNTER — Encounter (HOSPITAL_COMMUNITY)
Admission: RE | Admit: 2014-04-06 | Discharge: 2014-04-06 | Disposition: A | Discharge status: Home / Self Care | Payer: Medicare Other | Source: Ambulatory Visit | Attending: Internal Medicine | Admitting: Internal Medicine

## 2014-04-06 DIAGNOSIS — Z951 Presence of aortocoronary bypass graft: Secondary | ICD-10-CM | POA: Diagnosis not present

## 2014-04-07 ENCOUNTER — Other Ambulatory Visit: Payer: Self-pay | Admitting: Internal Medicine

## 2014-04-07 ENCOUNTER — Telehealth: Payer: Self-pay | Admitting: Internal Medicine

## 2014-04-07 DIAGNOSIS — K219 Gastro-esophageal reflux disease without esophagitis: Secondary | ICD-10-CM

## 2014-04-07 DIAGNOSIS — R1011 Right upper quadrant pain: Secondary | ICD-10-CM

## 2014-04-07 NOTE — Telephone Encounter (Signed)
Patient returned my call. She has been advised of MD response. She would like a referral to see Dr Olevia Perches.

## 2014-04-07 NOTE — Telephone Encounter (Signed)
Patient called and would like to know if she can increase her zantac from 2 per day to 3 per day. She states she is still having the burning in her chest and she thinks she needs one before lunch bc that is her largest meal of the day. CB# [phone]

## 2014-04-07 NOTE — Telephone Encounter (Signed)
Phone call to patient. She states she is busy at the grocery store. She will call back in 5 minutes.

## 2014-04-07 NOTE — Telephone Encounter (Signed)
OTC B12 Ok if she wants to try Zantac tid not appropriate GI referral if symptoms not controlled

## 2014-04-07 NOTE — Telephone Encounter (Signed)
Patient also states her nails are brittle and her hair is falling out. One of the nurses at her cardiac rehab suggested that she should take a b12 supplement. She would like to know if Dr. Linna Darner thinks that is okay.

## 2014-04-08 ENCOUNTER — Telehealth (HOSPITAL_COMMUNITY): Payer: Self-pay | Admitting: Internal Medicine

## 2014-04-08 ENCOUNTER — Ambulatory Visit (HOSPITAL_COMMUNITY): Payer: Medicare Other

## 2014-04-08 ENCOUNTER — Encounter (HOSPITAL_COMMUNITY): Payer: Medicare Other

## 2014-04-13 ENCOUNTER — Encounter (HOSPITAL_COMMUNITY)
Admission: RE | Admit: 2014-04-13 | Discharge: 2014-04-13 | Disposition: A | Discharge status: Home / Self Care | Payer: Medicare Other | Source: Ambulatory Visit | Attending: Internal Medicine | Admitting: Internal Medicine

## 2014-04-13 ENCOUNTER — Ambulatory Visit (HOSPITAL_COMMUNITY): Payer: Medicare Other

## 2014-04-13 DIAGNOSIS — Z951 Presence of aortocoronary bypass graft: Secondary | ICD-10-CM | POA: Diagnosis not present

## 2014-04-15 ENCOUNTER — Encounter (HOSPITAL_COMMUNITY)
Admission: RE | Admit: 2014-04-15 | Discharge: 2014-04-15 | Disposition: A | Discharge status: Home / Self Care | Payer: Medicare Other | Source: Ambulatory Visit | Attending: Internal Medicine | Admitting: Internal Medicine

## 2014-04-15 ENCOUNTER — Ambulatory Visit (HOSPITAL_COMMUNITY): Payer: Medicare Other

## 2014-04-15 DIAGNOSIS — Z951 Presence of aortocoronary bypass graft: Secondary | ICD-10-CM | POA: Diagnosis not present

## 2014-04-17 ENCOUNTER — Ambulatory Visit (HOSPITAL_COMMUNITY): Payer: Medicare Other

## 2014-04-17 ENCOUNTER — Encounter (HOSPITAL_COMMUNITY)
Admission: RE | Admit: 2014-04-17 | Discharge: 2014-04-17 | Disposition: A | Discharge status: Home / Self Care | Payer: Medicare Other | Source: Ambulatory Visit | Attending: Internal Medicine | Admitting: Internal Medicine

## 2014-04-17 DIAGNOSIS — Z951 Presence of aortocoronary bypass graft: Secondary | ICD-10-CM | POA: Diagnosis not present

## 2014-04-20 ENCOUNTER — Encounter (HOSPITAL_COMMUNITY)
Admission: RE | Admit: 2014-04-20 | Discharge: 2014-04-20 | Disposition: A | Discharge status: Home / Self Care | Payer: Medicare Other | Source: Ambulatory Visit | Attending: Internal Medicine | Admitting: Internal Medicine

## 2014-04-20 DIAGNOSIS — Z951 Presence of aortocoronary bypass graft: Secondary | ICD-10-CM | POA: Diagnosis not present

## 2014-04-22 ENCOUNTER — Ambulatory Visit (HOSPITAL_COMMUNITY): Payer: Medicare Other

## 2014-04-22 ENCOUNTER — Encounter (HOSPITAL_COMMUNITY): Payer: Medicare Other

## 2014-04-23 ENCOUNTER — Encounter (HOSPITAL_COMMUNITY): Payer: Self-pay | Admitting: Interventional Cardiology

## 2014-04-24 ENCOUNTER — Telehealth (HOSPITAL_COMMUNITY): Payer: Self-pay | Admitting: Internal Medicine

## 2014-04-24 ENCOUNTER — Encounter (HOSPITAL_COMMUNITY): Admission: RE | Admit: 2014-04-24 | Payer: Medicare Other | Source: Ambulatory Visit

## 2014-04-24 ENCOUNTER — Encounter: Payer: Self-pay | Admitting: Internal Medicine

## 2014-04-24 ENCOUNTER — Ambulatory Visit (HOSPITAL_COMMUNITY): Payer: Medicare Other

## 2014-04-24 ENCOUNTER — Telehealth (HOSPITAL_COMMUNITY): Payer: Self-pay | Admitting: Cardiac Rehabilitation

## 2014-04-24 NOTE — Telephone Encounter (Signed)
Message received from pt will be absent from cardiac rehab due to helping a friend recover from [place].

## 2014-04-27 ENCOUNTER — Encounter (HOSPITAL_COMMUNITY)
Admission: RE | Admit: 2014-04-27 | Discharge: 2014-04-27 | Disposition: A | Discharge status: Home / Self Care | Payer: Medicare Other | Source: Ambulatory Visit | Attending: Internal Medicine | Admitting: Internal Medicine

## 2014-04-27 DIAGNOSIS — Z951 Presence of aortocoronary bypass graft: Secondary | ICD-10-CM | POA: Diagnosis not present

## 2014-04-27 NOTE — Progress Notes (Signed)
Pt graduates today with the completion of 36 exercise session in the phase II program.  Pt maintained excellent attendance to exercise and education classes.  Pt made significant increase with MET level progression from 3.1 to 3.9.  Pt plans to continue home exercise in the cardiac rehab maintenance program 3 x week.  Pt plans to exercise on her "off" days with walking in the mall for one hour.  Pt feels she has met both her short term and long term goal.  Pt reports having increase stamina and currently walks one hour on her off days.  Medication list reconciled. Pt verbalizes compliance to her medication regimen.  Repeat PHQ2 score 0.  Pt demonstrates appropriate coping skills and positive attitude.  Pt is joy to work with and we look forward to working with her in the maintenance program. Cherre Huger, BSN

## 2014-04-29 ENCOUNTER — Ambulatory Visit (HOSPITAL_COMMUNITY): Payer: Medicare Other

## 2014-04-29 ENCOUNTER — Encounter (HOSPITAL_COMMUNITY)
Admission: RE | Admit: 2014-04-29 | Discharge: 2014-04-29 | Disposition: A | Discharge status: Home / Self Care | Payer: Self-pay | Source: Ambulatory Visit | Attending: Internal Medicine | Admitting: Internal Medicine

## 2014-04-29 DIAGNOSIS — Z951 Presence of aortocoronary bypass graft: Secondary | ICD-10-CM | POA: Insufficient documentation

## 2014-04-29 DIAGNOSIS — I509 Heart failure, unspecified: Secondary | ICD-10-CM | POA: Insufficient documentation

## 2014-04-29 DIAGNOSIS — E119 Type 2 diabetes mellitus without complications: Secondary | ICD-10-CM | POA: Insufficient documentation

## 2014-04-29 DIAGNOSIS — I2510 Atherosclerotic heart disease of native coronary artery without angina pectoris: Secondary | ICD-10-CM | POA: Insufficient documentation

## 2014-04-29 DIAGNOSIS — Z833 Family history of diabetes mellitus: Secondary | ICD-10-CM | POA: Insufficient documentation

## 2014-04-29 DIAGNOSIS — Z5189 Encounter for other specified aftercare: Secondary | ICD-10-CM | POA: Insufficient documentation

## 2014-04-29 DIAGNOSIS — I499 Cardiac arrhythmia, unspecified: Secondary | ICD-10-CM | POA: Insufficient documentation

## 2014-04-29 DIAGNOSIS — E785 Hyperlipidemia, unspecified: Secondary | ICD-10-CM | POA: Insufficient documentation

## 2014-04-29 DIAGNOSIS — I252 Old myocardial infarction: Secondary | ICD-10-CM | POA: Insufficient documentation

## 2014-04-29 DIAGNOSIS — Z8249 Family history of ischemic heart disease and other diseases of the circulatory system: Secondary | ICD-10-CM | POA: Insufficient documentation

## 2014-04-30 ENCOUNTER — Encounter (HOSPITAL_COMMUNITY)
Admission: RE | Admit: 2014-04-30 | Discharge: 2014-04-30 | Disposition: A | Discharge status: Home / Self Care | Payer: Medicare Other | Source: Ambulatory Visit | Attending: Internal Medicine | Admitting: Internal Medicine

## 2014-05-01 ENCOUNTER — Encounter (HOSPITAL_COMMUNITY)
Admission: RE | Admit: 2014-05-01 | Payer: Medicare Other | Source: Ambulatory Visit | Attending: Internal Medicine | Admitting: Internal Medicine

## 2014-05-01 ENCOUNTER — Ambulatory Visit (HOSPITAL_COMMUNITY): Payer: Medicare Other

## 2014-05-04 ENCOUNTER — Encounter (HOSPITAL_COMMUNITY)
Admission: RE | Admit: 2014-05-04 | Discharge: 2014-05-04 | Disposition: A | Discharge status: Home / Self Care | Payer: Self-pay | Source: Ambulatory Visit | Attending: Internal Medicine | Admitting: Internal Medicine

## 2014-05-05 ENCOUNTER — Encounter: Payer: Self-pay | Admitting: *Deleted

## 2014-05-05 ENCOUNTER — Encounter (HOSPITAL_COMMUNITY)
Admission: RE | Admit: 2014-05-05 | Discharge: 2014-05-05 | Disposition: A | Discharge status: Home / Self Care | Payer: Self-pay | Source: Ambulatory Visit | Attending: Internal Medicine | Admitting: Internal Medicine

## 2014-05-07 ENCOUNTER — Encounter (HOSPITAL_COMMUNITY): Payer: Self-pay

## 2014-05-11 ENCOUNTER — Encounter (HOSPITAL_COMMUNITY): Payer: Self-pay

## 2014-05-12 ENCOUNTER — Encounter (HOSPITAL_COMMUNITY): Payer: Self-pay

## 2014-05-14 ENCOUNTER — Encounter (HOSPITAL_COMMUNITY): Payer: Self-pay

## 2014-05-18 ENCOUNTER — Encounter (HOSPITAL_COMMUNITY)
Admission: RE | Admit: 2014-05-18 | Discharge: 2014-05-18 | Disposition: A | Discharge status: Home / Self Care | Payer: Self-pay | Source: Ambulatory Visit | Attending: Internal Medicine | Admitting: Internal Medicine

## 2014-05-18 DIAGNOSIS — Z833 Family history of diabetes mellitus: Secondary | ICD-10-CM | POA: Insufficient documentation

## 2014-05-18 DIAGNOSIS — Z8249 Family history of ischemic heart disease and other diseases of the circulatory system: Secondary | ICD-10-CM | POA: Insufficient documentation

## 2014-05-18 DIAGNOSIS — Z5189 Encounter for other specified aftercare: Secondary | ICD-10-CM | POA: Insufficient documentation

## 2014-05-18 DIAGNOSIS — I252 Old myocardial infarction: Secondary | ICD-10-CM | POA: Insufficient documentation

## 2014-05-18 DIAGNOSIS — I499 Cardiac arrhythmia, unspecified: Secondary | ICD-10-CM | POA: Insufficient documentation

## 2014-05-18 DIAGNOSIS — E119 Type 2 diabetes mellitus without complications: Secondary | ICD-10-CM | POA: Insufficient documentation

## 2014-05-18 DIAGNOSIS — I509 Heart failure, unspecified: Secondary | ICD-10-CM | POA: Insufficient documentation

## 2014-05-18 DIAGNOSIS — I2510 Atherosclerotic heart disease of native coronary artery without angina pectoris: Secondary | ICD-10-CM | POA: Insufficient documentation

## 2014-05-18 DIAGNOSIS — E785 Hyperlipidemia, unspecified: Secondary | ICD-10-CM | POA: Insufficient documentation

## 2014-05-18 DIAGNOSIS — Z951 Presence of aortocoronary bypass graft: Secondary | ICD-10-CM | POA: Insufficient documentation

## 2014-05-19 ENCOUNTER — Encounter (HOSPITAL_COMMUNITY)
Admission: RE | Admit: 2014-05-19 | Discharge: 2014-05-19 | Disposition: A | Discharge status: Home / Self Care | Payer: Self-pay | Source: Ambulatory Visit | Attending: Internal Medicine | Admitting: Internal Medicine

## 2014-05-21 ENCOUNTER — Encounter (HOSPITAL_COMMUNITY)
Admission: RE | Admit: 2014-05-21 | Discharge: 2014-05-21 | Disposition: A | Discharge status: Home / Self Care | Payer: Self-pay | Source: Ambulatory Visit | Attending: Internal Medicine | Admitting: Internal Medicine

## 2014-05-25 ENCOUNTER — Encounter (HOSPITAL_COMMUNITY): Payer: Self-pay

## 2014-05-26 ENCOUNTER — Encounter (HOSPITAL_COMMUNITY): Payer: Self-pay

## 2014-05-28 ENCOUNTER — Encounter (HOSPITAL_COMMUNITY): Payer: Self-pay

## 2014-05-28 ENCOUNTER — Other Ambulatory Visit: Payer: Self-pay | Admitting: Internal Medicine

## 2014-06-01 ENCOUNTER — Encounter (HOSPITAL_COMMUNITY): Payer: Self-pay

## 2014-06-02 ENCOUNTER — Telehealth: Payer: Self-pay | Admitting: Internal Medicine

## 2014-06-02 ENCOUNTER — Encounter (HOSPITAL_COMMUNITY): Payer: Self-pay

## 2014-06-04 ENCOUNTER — Encounter (HOSPITAL_COMMUNITY): Payer: Self-pay

## 2014-06-04 NOTE — Telephone Encounter (Signed)
Close encounter 

## 2014-06-08 ENCOUNTER — Encounter (HOSPITAL_COMMUNITY): Payer: Self-pay

## 2014-06-09 ENCOUNTER — Encounter (HOSPITAL_COMMUNITY): Payer: Self-pay

## 2014-06-11 ENCOUNTER — Encounter (HOSPITAL_COMMUNITY): Payer: Self-pay

## 2014-06-15 ENCOUNTER — Encounter (HOSPITAL_COMMUNITY): Payer: No Typology Code available for payment source | Attending: Internal Medicine

## 2014-06-15 DIAGNOSIS — Z833 Family history of diabetes mellitus: Secondary | ICD-10-CM | POA: Insufficient documentation

## 2014-06-15 DIAGNOSIS — I2510 Atherosclerotic heart disease of native coronary artery without angina pectoris: Secondary | ICD-10-CM | POA: Insufficient documentation

## 2014-06-15 DIAGNOSIS — Z951 Presence of aortocoronary bypass graft: Secondary | ICD-10-CM | POA: Insufficient documentation

## 2014-06-15 DIAGNOSIS — Z5189 Encounter for other specified aftercare: Secondary | ICD-10-CM | POA: Insufficient documentation

## 2014-06-15 DIAGNOSIS — I499 Cardiac arrhythmia, unspecified: Secondary | ICD-10-CM | POA: Insufficient documentation

## 2014-06-15 DIAGNOSIS — E119 Type 2 diabetes mellitus without complications: Secondary | ICD-10-CM | POA: Insufficient documentation

## 2014-06-15 DIAGNOSIS — I509 Heart failure, unspecified: Secondary | ICD-10-CM | POA: Insufficient documentation

## 2014-06-15 DIAGNOSIS — Z8249 Family history of ischemic heart disease and other diseases of the circulatory system: Secondary | ICD-10-CM | POA: Insufficient documentation

## 2014-06-15 DIAGNOSIS — E785 Hyperlipidemia, unspecified: Secondary | ICD-10-CM | POA: Insufficient documentation

## 2014-06-15 DIAGNOSIS — I252 Old myocardial infarction: Secondary | ICD-10-CM | POA: Insufficient documentation

## 2014-06-16 ENCOUNTER — Encounter (HOSPITAL_COMMUNITY): Payer: Self-pay

## 2014-06-18 ENCOUNTER — Encounter (HOSPITAL_COMMUNITY): Payer: Self-pay

## 2014-06-19 ENCOUNTER — Ambulatory Visit (INDEPENDENT_AMBULATORY_CARE_PROVIDER_SITE_OTHER): Payer: Medicare Other | Admitting: Internal Medicine

## 2014-06-19 ENCOUNTER — Encounter: Payer: Self-pay | Admitting: Internal Medicine

## 2014-06-19 VITALS — BP 118/70 | HR 87 | Ht 62.0 in | Wt 135.8 lb

## 2014-06-19 DIAGNOSIS — R112 Nausea with vomiting, unspecified: Secondary | ICD-10-CM

## 2014-06-19 DIAGNOSIS — R634 Abnormal weight loss: Secondary | ICD-10-CM | POA: Diagnosis not present

## 2014-06-19 DIAGNOSIS — K824 Cholesterolosis of gallbladder: Secondary | ICD-10-CM | POA: Diagnosis not present

## 2014-06-19 NOTE — Patient Instructions (Addendum)
Please follow up as needed Schedule screening colonoscopy at your convenience Consider HIDA scan to assess biliary function Dr Viona Gilmore.Kathryn Logan

## 2014-06-19 NOTE — Progress Notes (Signed)
Kathryn Logan 07-15-1945 [id number]  Note: This dictation was prepared with Dragon digital system. Any transcriptional errors that result from this procedure are unintentional.   History of Present Illness: This is a 69 year old white female diabetic who is here to evaluate 3 separate attacks of vomiting which occurred last fall. She underwent coronary artery bypass graft in the summer 2015 having an MI. Prior to that she was a type II diabetic and  was trying to lose weight  . She has  lost about 16 pounds. Since the surgery she has been on a strict low-fat diet and  managed to  lose weight to 130 pounds. She does cardiac  rehabilitation 3 times a week and her hemoglobin A1c has come down to 5.3. The 3 separate episodes of vomiting occurred after she ate fast food. There were during the day and lasted about an hour. There was no jaundice or fever. She vomited undigested food. She denies history of gastroesophageal reflux dysphagia or heartburn. Ultrasound of the gallbladder October 2015 confirmed biliary sludge and one polyps. Gallbladder wall was normal size. Common bile duct was 4.6 mm. She was started on ranitidine 150 mg twice a day. She has continued bland diet. There is no family history of gallbladder disease. She has never had a colonoscopy    Past Medical History  Diagnosis Date  . Osteopenia   . Hyperlipidemia   . Diabetes mellitus without complication   . Heart disease   . Gallbladder sludge     Past Surgical History  Procedure Laterality Date  . Tonsillectomy and adenoidectomy    . G 1 p 1    . No colonoscopy      [name] reviewed  . Coronary artery bypass graft N/A 12/01/2013    Procedure: CORONARY ARTERY BYPASS GRAFTING (CABG) x three, using left internal mammary artery, and right leg saphenous vein harvested endoscopically (LIMA to LAD, SVG to OM2 , SVG to dRCA);  Surgeon: Grace Isaac, MD;  Location: [facility];  Service: Open Heart Surgery;  Laterality: N/A;  .  Intraoperative transesophageal echocardiogram N/A 12/01/2013    Procedure: INTRAOPERATIVE TRANSESOPHAGEAL ECHOCARDIOGRAM;  Surgeon: Grace Isaac, MD;  Location: [facility];  Service: Open Heart Surgery;  Laterality: N/A;  . Left heart catheterization with coronary angiogram N/A 11/27/2013    Procedure: LEFT HEART CATHETERIZATION WITH CORONARY ANGIOGRAM;  Surgeon: Sinclair Grooms, MD;  Location: [facility] C[facility] LAB;  Service: Cardiovascular;  Laterality: N/A;    No Known Allergies  Family history and social history have been reviewed.  Review of Systems:   The remainder of the 10 point ROS is negative except as outlined in the H&P  Physical Exam: General Appearance Well developed, in no distress Eyes  Non icteric  HEENT  Non traumatic, normocephalic  Mouth No lesion, tongue papillated, no cheilosis Neck Supple without adenopathy, thyroid not enlarged, no carotid bruits, no JVD Lungs Clear to auscultation bilaterally COR Normal S1, normal S2, regular rhythm, no murmur, quiet precordium Abdomen soft nontender abdomen with normoactive bowel sounds. Post thoracotomy scar which is rather tender up in the sternnal. Lower abdomen unremarkable. Liver edge at costal margin. Rectal moderate amount of Hemoccult negative stool Extremities  No pedal edema Skin No lesions Neurological Alert and oriented x 3 Psychological Normal mood and affect  Assessment and Plan:   69 year old white female with 3 separate episodes of vomiting in the setting of biliary sludge. Weight loss and major surgery. All of these conditions are conducive to  cholelithiasis. She has been well now for several months and is not interested in further evaluation which would include  HIDA scan. She would like to wait and see if she has  another episode .She has no history of gastroesophageal reflux or hiatal hernia and has done when on Ranitidine 150 mg twice a day. She is going to reduce it to 150 mg daily  Colorectal screening. She has  never had a colonoscopy. I advised her to schedulel screening colonoscopy in near future    Delfin Edis 06/19/2014

## 2014-06-22 ENCOUNTER — Encounter (HOSPITAL_COMMUNITY): Payer: Self-pay

## 2014-06-23 ENCOUNTER — Encounter (HOSPITAL_COMMUNITY): Payer: Self-pay

## 2014-06-25 ENCOUNTER — Encounter (HOSPITAL_COMMUNITY): Payer: Self-pay

## 2014-06-29 ENCOUNTER — Encounter (HOSPITAL_COMMUNITY): Payer: Self-pay

## 2014-06-30 ENCOUNTER — Encounter (HOSPITAL_COMMUNITY): Payer: Self-pay

## 2014-07-02 ENCOUNTER — Encounter (HOSPITAL_COMMUNITY): Payer: Self-pay

## 2014-07-06 ENCOUNTER — Encounter (HOSPITAL_COMMUNITY): Payer: Self-pay

## 2014-07-07 ENCOUNTER — Telehealth (HOSPITAL_COMMUNITY): Payer: Self-pay | Admitting: *Deleted

## 2014-07-07 ENCOUNTER — Encounter (HOSPITAL_COMMUNITY): Payer: Self-pay

## 2014-07-09 ENCOUNTER — Encounter (HOSPITAL_COMMUNITY): Payer: Self-pay

## 2014-07-10 ENCOUNTER — Telehealth: Payer: Self-pay | Admitting: Internal Medicine

## 2014-07-10 MED ORDER — LORATADINE 10 MG PO TABS: 10.0000 mg | ORAL_TABLET | Freq: Every day | ORAL | Status: DC

## 2014-07-10 NOTE — Telephone Encounter (Signed)
Patient takes 1 claritin per day. Dr Linna Darner called in an [place] for Loratidine for her husband which is covered by her insurance. She is requesting if it is possible to get that Rx as well to [facility].

## 2014-07-10 NOTE — Telephone Encounter (Signed)
#  90 , RX 3 

## 2014-07-13 ENCOUNTER — Encounter (HOSPITAL_COMMUNITY): Payer: Self-pay

## 2014-07-14 ENCOUNTER — Encounter (HOSPITAL_COMMUNITY): Payer: PRIVATE HEALTH INSURANCE

## 2014-07-14 DIAGNOSIS — I2510 Atherosclerotic heart disease of native coronary artery without angina pectoris: Secondary | ICD-10-CM | POA: Insufficient documentation

## 2014-07-14 DIAGNOSIS — Z5189 Encounter for other specified aftercare: Secondary | ICD-10-CM | POA: Insufficient documentation

## 2014-07-14 DIAGNOSIS — I499 Cardiac arrhythmia, unspecified: Secondary | ICD-10-CM | POA: Insufficient documentation

## 2014-07-14 DIAGNOSIS — Z833 Family history of diabetes mellitus: Secondary | ICD-10-CM | POA: Insufficient documentation

## 2014-07-14 DIAGNOSIS — I252 Old myocardial infarction: Secondary | ICD-10-CM | POA: Insufficient documentation

## 2014-07-14 DIAGNOSIS — Z951 Presence of aortocoronary bypass graft: Secondary | ICD-10-CM | POA: Insufficient documentation

## 2014-07-14 DIAGNOSIS — E119 Type 2 diabetes mellitus without complications: Secondary | ICD-10-CM | POA: Insufficient documentation

## 2014-07-14 DIAGNOSIS — I509 Heart failure, unspecified: Secondary | ICD-10-CM | POA: Insufficient documentation

## 2014-07-14 DIAGNOSIS — E785 Hyperlipidemia, unspecified: Secondary | ICD-10-CM | POA: Insufficient documentation

## 2014-07-14 DIAGNOSIS — Z8249 Family history of ischemic heart disease and other diseases of the circulatory system: Secondary | ICD-10-CM | POA: Insufficient documentation

## 2014-07-16 ENCOUNTER — Encounter (HOSPITAL_COMMUNITY): Payer: Self-pay

## 2014-07-20 ENCOUNTER — Encounter (HOSPITAL_COMMUNITY): Payer: Self-pay

## 2014-07-21 ENCOUNTER — Encounter (HOSPITAL_COMMUNITY): Payer: Self-pay

## 2014-07-23 ENCOUNTER — Encounter (HOSPITAL_COMMUNITY): Payer: Self-pay

## 2014-07-27 ENCOUNTER — Encounter (HOSPITAL_COMMUNITY): Payer: Self-pay

## 2014-07-28 ENCOUNTER — Encounter (HOSPITAL_COMMUNITY): Payer: Self-pay

## 2014-07-30 ENCOUNTER — Encounter (HOSPITAL_COMMUNITY): Payer: Self-pay

## 2014-08-03 ENCOUNTER — Encounter (HOSPITAL_COMMUNITY): Payer: Self-pay

## 2014-08-04 ENCOUNTER — Encounter (HOSPITAL_COMMUNITY): Payer: Self-pay

## 2014-08-04 ENCOUNTER — Encounter: Payer: Self-pay | Admitting: Internal Medicine

## 2014-08-04 ENCOUNTER — Ambulatory Visit (INDEPENDENT_AMBULATORY_CARE_PROVIDER_SITE_OTHER): Payer: Medicare Other | Admitting: Internal Medicine

## 2014-08-04 VITALS — BP 118/78 | HR 98 | Ht 62.0 in | Wt 143.7 lb

## 2014-08-04 DIAGNOSIS — E1151 Type 2 diabetes mellitus with diabetic peripheral angiopathy without gangrene: Secondary | ICD-10-CM | POA: Diagnosis not present

## 2014-08-04 DIAGNOSIS — E785 Hyperlipidemia, unspecified: Secondary | ICD-10-CM | POA: Diagnosis not present

## 2014-08-04 DIAGNOSIS — Z951 Presence of aortocoronary bypass graft: Secondary | ICD-10-CM

## 2014-08-04 DIAGNOSIS — E1159 Type 2 diabetes mellitus with other circulatory complications: Secondary | ICD-10-CM

## 2014-08-04 NOTE — Progress Notes (Signed)
OFFICE NOTE  Chief Complaint:  No complaints  Primary Care Physician: Unice Cobble, MD  HPI:  Kathryn Logan is a 69 year old female with a h/o diet controlled DM but no prior cardiac history who presented to [facility] on 11/26/2013 with chest discomfort with associated nausea and diaphoresis. EKG demonstrated nonspecific T-wave abnormalities. Initial cardiac enzymes were elevated and she ruled in for non-ST elevation myocardial infarction. Subsequently, she underwent a left heart catheterization, perform by Dr. Tamala Julian, which revealed severe coronary artery disease involving the proximal LAD, the first and second marginal branches and a nondominant right coronary artery. She was found to have moderate to severe left ventricular systolic dysfunction with an estimated ejection fraction of 30-35%. However, post-cath 2-D echocardiogram revealed an EF of 40-45%. Surgical revascularization was recommended for her CAD. She ultimately underwent CABG x3 by Dr. Servando Snare on 12/01/2048. She received a LIMA-LAD, SVG-OM2, SVG-dRCA. She had an uncomplicated postoperative course. She was discharged home on 12/05/2013. She was discharged home on aspirin, metoprolol, lisinopril and atorvastatin.  Kathryn Logan was recently seen by Ellen Henri, PA-C., who recommended increasing her beta blocker due to tachycardia. Recently she's been having problems with hypotension, mostly in the morning at cardiac rehabilitation. She is generally asymptomatic with this.  I saw Kathryn Logan back in the office today. Overall she is doing really well. She seems to be maintaining a high normal heart rate in the 80s and 90s. Think a lot of this may be due to anxiety. She's had low blood pressure in the past however I moved her lisinopril to take at night and her blood pressures been more stable. She denies any chest pain or worsening shortness of breath. She's not on any exercise over the past month and has a small  amount of weight gain. She signed up for maintenance cardiac redilatation starting in a couple of days. Hopefully this will help her with weight loss and better cardiac conditioning.  PMHx:  Past Medical History  Diagnosis Date  . Osteopenia   . Hyperlipidemia   . Diabetes mellitus without complication   . Heart disease   . Gallbladder sludge     Past Surgical History  Procedure Laterality Date  . Tonsillectomy and adenoidectomy    . G 1 p 1    . No colonoscopy      [place] reviewed  . Coronary artery bypass graft N/A 12/01/2013    Procedure: CORONARY ARTERY BYPASS GRAFTING (CABG) x three, using left internal mammary artery, and right leg saphenous vein harvested endoscopically (LIMA to LAD, SVG to OM2 , SVG to dRCA);  Surgeon: Grace Isaac, MD;  Location: [place];  Service: Open Heart Surgery;  Laterality: N/A;  . Intraoperative transesophageal echocardiogram N/A 12/01/2013    Procedure: INTRAOPERATIVE TRANSESOPHAGEAL ECHOCARDIOGRAM;  Surgeon: Grace Isaac, MD;  Location: [place];  Service: Open Heart Surgery;  Laterality: N/A;  . Left heart catheterization with coronary angiogram N/A 11/27/2013    Procedure: LEFT HEART CATHETERIZATION WITH CORONARY ANGIOGRAM;  Surgeon: Sinclair Grooms, MD;  Location: [place] CATH LAB;  Service: Cardiovascular;  Laterality: N/A;    FAMHx:  Family History  Problem Relation Age of Onset  . Heart attack Mother 56  . Diabetes Mother   . Stroke Mother   . Hypertension Mother   . Goiter Mother   . Heart attack Father     >55  . Bladder Cancer Father   . Parkinson's disease Maternal Aunt   . Osteoporosis  Sister   . Diabetes Brother     SOCHx:   reports that she has never smoked. She does not have any smokeless tobacco history on file. She reports that she drinks alcohol. She reports that she does not use illicit drugs.  ALLERGIES:  No Known Allergies  ROS: A comprehensive review of systems was negative.  HOME MEDS: Current Outpatient  Prescriptions  Medication Sig Dispense Refill  . aspirin EC 81 MG tablet Take 81 mg by mouth daily.     [name] atorvastatin (LIPITOR) 80 MG tablet Take 1 tablet (80 mg total) by mouth daily. 30 tablet 11  . BIOTIN PO Take 5,000 mcg by mouth daily.     . bisacodyl (DULCOLAX) 5 MG EC tablet Take 5 mg by mouth daily as needed for moderate constipation.     Mariane Baumgarten Calcium (STOOL SOFTENER PO) Take by mouth as needed.    [name] glucose blood (ONE TOUCH ULTRA TEST) test strip Check blood sugar daily as directed, DX 250.02 100 each 12  . lanolin-mineral oil (BABY OIL) OIL Apply 1 application topically every other day.    . lisinopril (PRINIVIL,ZESTRIL) 2.5 MG tablet Take 1 tablet (2.5 mg total) by mouth at bedtime. 30 tablet 11  . loratadine (CLARITIN) 10 MG tablet Take 1 tablet (10 mg total) by mouth daily. 90 tablet 3  . metoprolol (LOPRESSOR) 50 MG tablet Take 1 tablet (50 mg total) by mouth 2 (two) times daily. 60 tablet 5  . ONETOUCH DELICA LANCETS FINE MISC 1 each by Does not apply route as directed. Check blood sugar daily as directed, DX 250.02 HOLD UNTIL PATIENT REQUEST 100 each 5  . ranitidine (ZANTAC) 150 MG tablet TAKE ONE TABLET BY MOUTH TWICE DAILY 60 tablet 5  . vitamin B-12 (CYANOCOBALAMIN) 1000 MCG tablet Take 1,000 mcg by mouth daily.     No current facility-administered medications for this visit.    LABS/IMAGING: No results found for this or any previous visit (from the past 48 hour(s)). No results found.  VITALS: BP 118/78 mmHg  Pulse 98  Ht 5\' 2"  (1.575 m)  Wt 143 lb 11.2 oz (65.182 kg)  BMI 26.28 kg/m2  EXAM: General appearance: alert and no distress Neck: no carotid bruit and no JVD Lungs: clear to auscultation bilaterally Heart: regular rate and rhythm, S1, S2 normal, no murmur, click, rub or gallop Abdomen: soft, non-tender; bowel sounds normal; no masses,  no organomegaly Extremities: extremities normal, atraumatic, no cyanosis or edema Pulses: 2+ and  symmetric Skin: Skin color, texture, turgor normal. No rashes or lesions Neurologic: Grossly normal Psych: Anxious  EKG: Normal sinus rhythm at 98  ASSESSMENT: 1. Coronary artery disease status post three-vessel CABG 2. DM2 3. Dyslipidemia  PLAN: 1.   Kathryn Logan continues to improve her bypass surgery. She denies any worsening chest pain or shortness of breath. She's going to start maintenance cardiac rehabilitation later this week. Diabetes and cholesterol followed by her primary care provider and are within normal limits. She continue risk factor modification will plan to see her back in 6 months.  Pixie Casino, MD, [facility] Attending Cardiologist [facility]  Macall Mccroskey C 08/04/2014, 5:11 PM

## 2014-08-04 NOTE — Patient Instructions (Signed)
Your physician wants you to follow-up in: 6 months with Dr. Hilty. You will receive a reminder letter in the mail two months in advance. If you don't receive a letter, please call our office to schedule the follow-up appointment.    

## 2014-08-06 ENCOUNTER — Encounter (HOSPITAL_COMMUNITY)
Admission: RE | Admit: 2014-08-06 | Discharge: 2014-08-06 | Disposition: A | Discharge status: Home / Self Care | Payer: Self-pay | Source: Ambulatory Visit | Attending: Internal Medicine | Admitting: Internal Medicine

## 2014-08-10 ENCOUNTER — Encounter (HOSPITAL_COMMUNITY): Payer: Self-pay

## 2014-08-11 ENCOUNTER — Encounter (HOSPITAL_COMMUNITY): Payer: Self-pay

## 2014-08-13 ENCOUNTER — Encounter (HOSPITAL_COMMUNITY): Payer: Self-pay

## 2014-08-17 ENCOUNTER — Encounter (HOSPITAL_COMMUNITY): Payer: PRIVATE HEALTH INSURANCE | Attending: Internal Medicine

## 2014-08-17 DIAGNOSIS — E119 Type 2 diabetes mellitus without complications: Secondary | ICD-10-CM | POA: Insufficient documentation

## 2014-08-17 DIAGNOSIS — I252 Old myocardial infarction: Secondary | ICD-10-CM | POA: Insufficient documentation

## 2014-08-17 DIAGNOSIS — Z8249 Family history of ischemic heart disease and other diseases of the circulatory system: Secondary | ICD-10-CM | POA: Insufficient documentation

## 2014-08-17 DIAGNOSIS — I509 Heart failure, unspecified: Secondary | ICD-10-CM | POA: Insufficient documentation

## 2014-08-17 DIAGNOSIS — Z951 Presence of aortocoronary bypass graft: Secondary | ICD-10-CM | POA: Insufficient documentation

## 2014-08-17 DIAGNOSIS — Z5189 Encounter for other specified aftercare: Secondary | ICD-10-CM | POA: Insufficient documentation

## 2014-08-17 DIAGNOSIS — I499 Cardiac arrhythmia, unspecified: Secondary | ICD-10-CM | POA: Insufficient documentation

## 2014-08-17 DIAGNOSIS — I2510 Atherosclerotic heart disease of native coronary artery without angina pectoris: Secondary | ICD-10-CM | POA: Insufficient documentation

## 2014-08-17 DIAGNOSIS — Z833 Family history of diabetes mellitus: Secondary | ICD-10-CM | POA: Insufficient documentation

## 2014-08-17 DIAGNOSIS — E785 Hyperlipidemia, unspecified: Secondary | ICD-10-CM | POA: Insufficient documentation

## 2014-08-18 ENCOUNTER — Encounter (HOSPITAL_COMMUNITY): Payer: Self-pay

## 2014-08-20 ENCOUNTER — Encounter (HOSPITAL_COMMUNITY): Payer: Self-pay

## 2014-08-24 ENCOUNTER — Encounter (HOSPITAL_COMMUNITY): Payer: Self-pay

## 2014-08-25 ENCOUNTER — Encounter (HOSPITAL_COMMUNITY): Payer: Self-pay

## 2014-08-27 ENCOUNTER — Encounter (HOSPITAL_COMMUNITY): Payer: Self-pay

## 2014-08-31 ENCOUNTER — Encounter (HOSPITAL_COMMUNITY): Payer: Self-pay

## 2014-09-01 ENCOUNTER — Encounter (HOSPITAL_COMMUNITY): Payer: Self-pay

## 2014-09-03 ENCOUNTER — Encounter (HOSPITAL_COMMUNITY): Payer: Self-pay

## 2014-09-07 ENCOUNTER — Encounter (HOSPITAL_COMMUNITY): Payer: Self-pay

## 2014-09-08 ENCOUNTER — Encounter (HOSPITAL_COMMUNITY): Payer: Self-pay

## 2014-09-10 ENCOUNTER — Encounter (HOSPITAL_COMMUNITY): Payer: Self-pay

## 2014-09-14 ENCOUNTER — Encounter (HOSPITAL_COMMUNITY): Payer: Self-pay

## 2014-09-15 ENCOUNTER — Encounter (HOSPITAL_COMMUNITY): Payer: Self-pay

## 2014-09-17 ENCOUNTER — Encounter (HOSPITAL_COMMUNITY): Payer: Self-pay

## 2014-09-21 ENCOUNTER — Encounter (HOSPITAL_COMMUNITY): Payer: Self-pay

## 2014-09-22 ENCOUNTER — Encounter (HOSPITAL_COMMUNITY): Payer: Self-pay

## 2014-09-24 ENCOUNTER — Encounter (HOSPITAL_COMMUNITY): Payer: Self-pay

## 2014-09-28 ENCOUNTER — Encounter (HOSPITAL_COMMUNITY): Payer: Self-pay

## 2014-09-29 ENCOUNTER — Encounter (HOSPITAL_COMMUNITY): Payer: Self-pay

## 2014-10-01 ENCOUNTER — Encounter (HOSPITAL_COMMUNITY): Payer: Self-pay

## 2014-10-05 ENCOUNTER — Other Ambulatory Visit: Payer: Self-pay | Admitting: Internal Medicine

## 2014-10-05 ENCOUNTER — Encounter (HOSPITAL_COMMUNITY): Payer: Self-pay

## 2014-10-05 NOTE — Telephone Encounter (Signed)
Rx has been sent to the pharmacy electronically. ° °

## 2014-10-06 ENCOUNTER — Encounter (HOSPITAL_COMMUNITY): Payer: Self-pay

## 2014-10-08 ENCOUNTER — Encounter (HOSPITAL_COMMUNITY): Payer: Self-pay

## 2014-10-13 ENCOUNTER — Encounter (HOSPITAL_COMMUNITY): Payer: Self-pay

## 2014-10-14 DIAGNOSIS — L814 Other melanin hyperpigmentation: Secondary | ICD-10-CM | POA: Diagnosis not present

## 2014-10-14 DIAGNOSIS — L918 Other hypertrophic disorders of the skin: Secondary | ICD-10-CM | POA: Diagnosis not present

## 2014-10-14 DIAGNOSIS — D1801 Hemangioma of skin and subcutaneous tissue: Secondary | ICD-10-CM | POA: Diagnosis not present

## 2014-10-14 DIAGNOSIS — D2262 Melanocytic nevi of left upper limb, including shoulder: Secondary | ICD-10-CM | POA: Diagnosis not present

## 2014-10-14 DIAGNOSIS — L821 Other seborrheic keratosis: Secondary | ICD-10-CM | POA: Diagnosis not present

## 2014-10-14 DIAGNOSIS — D2261 Melanocytic nevi of right upper limb, including shoulder: Secondary | ICD-10-CM | POA: Diagnosis not present

## 2014-10-14 DIAGNOSIS — D225 Melanocytic nevi of trunk: Secondary | ICD-10-CM | POA: Diagnosis not present

## 2014-10-22 IMAGING — CR DG CHEST 1V PORT
1 series · 1 of 1 positions shown · non-contrast
Comparison: 11/26/2013

CLINICAL DATA: Status post CABG

EXAM:
PORTABLE CHEST - 1 VIEW

[AP]
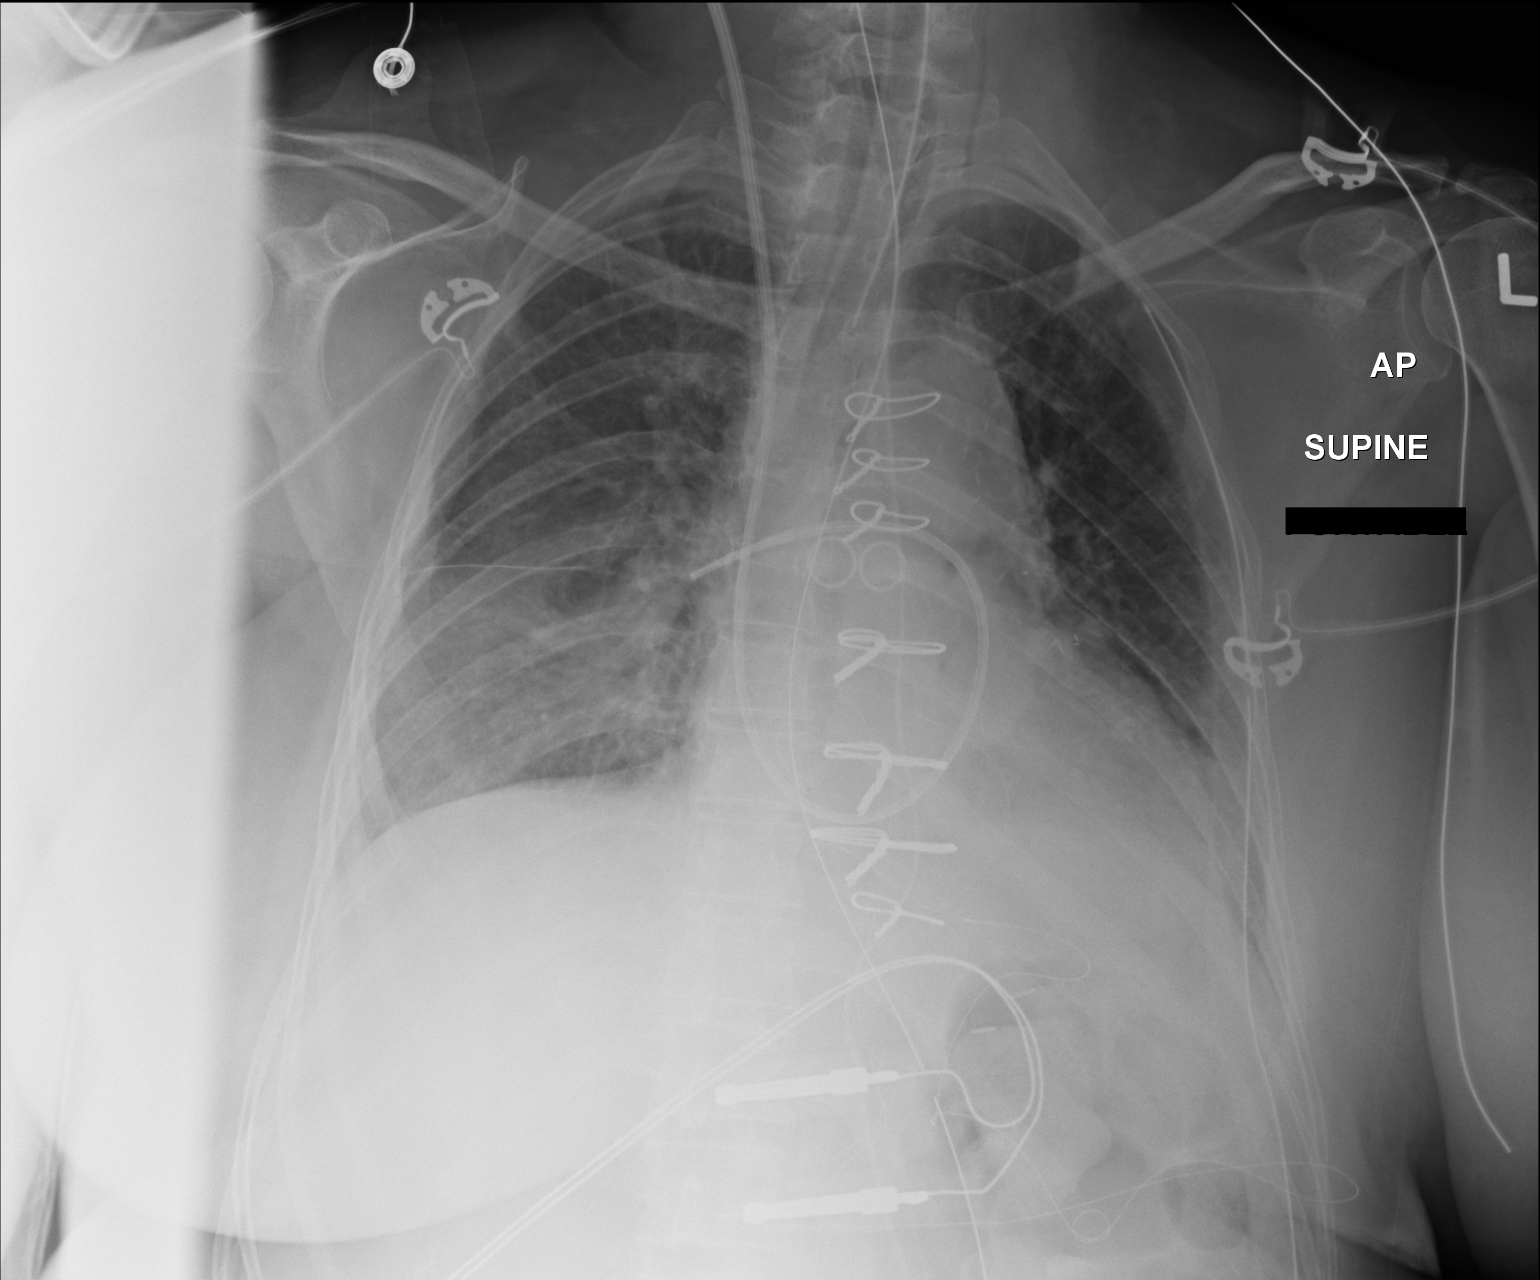

[1 of 1 positions shown; findings below may reference images not displayed]

FINDINGS: Endotracheal and nasogastric tubes are appropriately positioned.
Epicardial pacer wires are in place. Mild enlargement of the cardiac
silhouette is reidentified. Right IJ Swan-Ganz catheter tip is
positioned over the right main pulmonary artery and could be pulled
back approximately 3 cm for main pulmonary arterial position.
Central vascular congestion is noted with trace pleural effusions.
Mild prominence of the interstitial markings could suggest
superimposed edema.
IMPRESSION: Cardiomegaly with mild trace interstitial edema.

Support apparatus as above, including right IJ Swan-Ganz catheter
tip over right interlobar pulmonary artery, consider pulling back
approximately 3 cm.

## 2014-10-23 IMAGING — CR DG CHEST 1V PORT
1 series · 1 of 1 positions shown · non-contrast
Comparison: Portable chest x-ray of 12/01/2013

CLINICAL DATA: Post CABG, followup

EXAM:
PORTABLE CHEST - 1 VIEW

[portable]
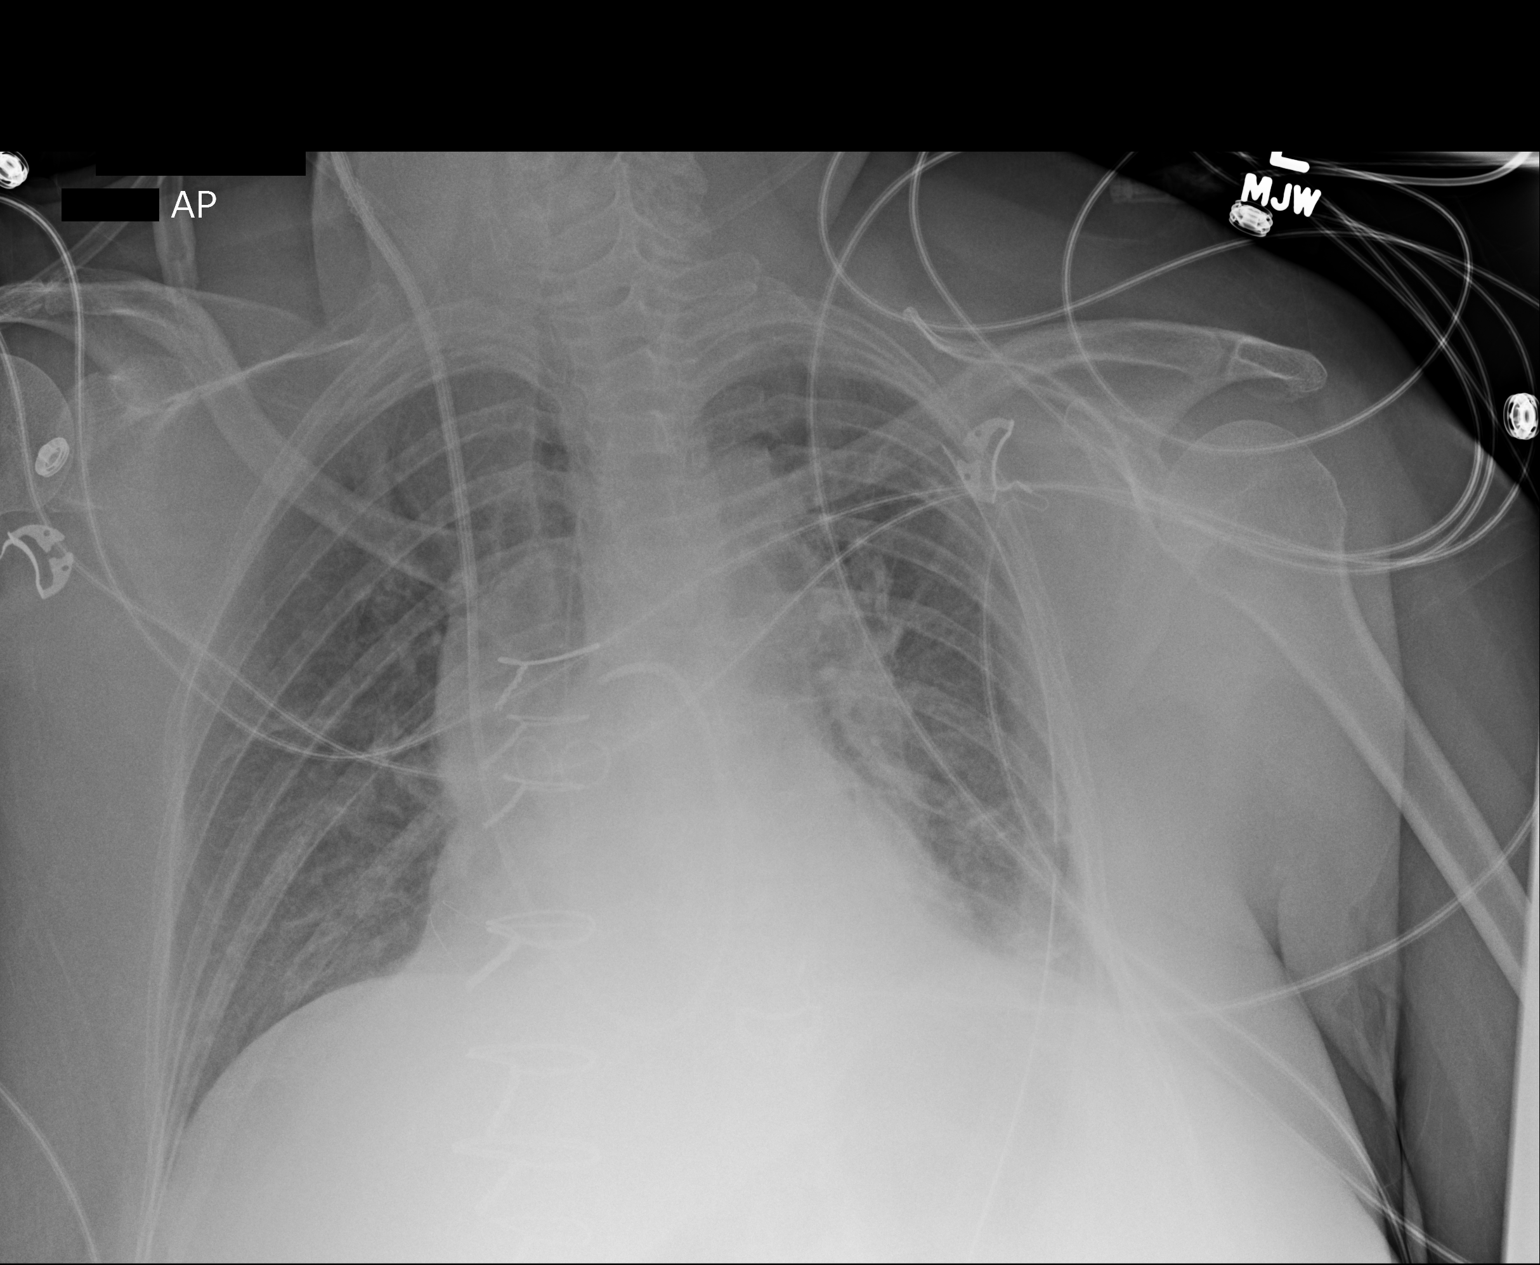

[1 of 1 positions shown; findings below may reference images not displayed]

FINDINGS: Aeration has improved slightly as has the mild edema pattern. The
endotracheal tube is been removed. Left chest tube remains and no
definite pneumothorax is seen. Mild cardiomegaly is stable.
Swan-Ganz catheter is noted with the tip in the right main pulmonary
artery.
IMPRESSION: Endotracheal tube removed. Improved aeration with improvement in
mild edema.

## 2014-11-09 ENCOUNTER — Other Ambulatory Visit: Payer: Self-pay

## 2014-12-03 IMAGING — CR DG CHEST 2V
2 series · 2 of 2 positions shown · non-contrast
Comparison: 12/04/2013

CLINICAL DATA: Acute MI.

EXAM:
CHEST  2 VIEW

[w chest pa]
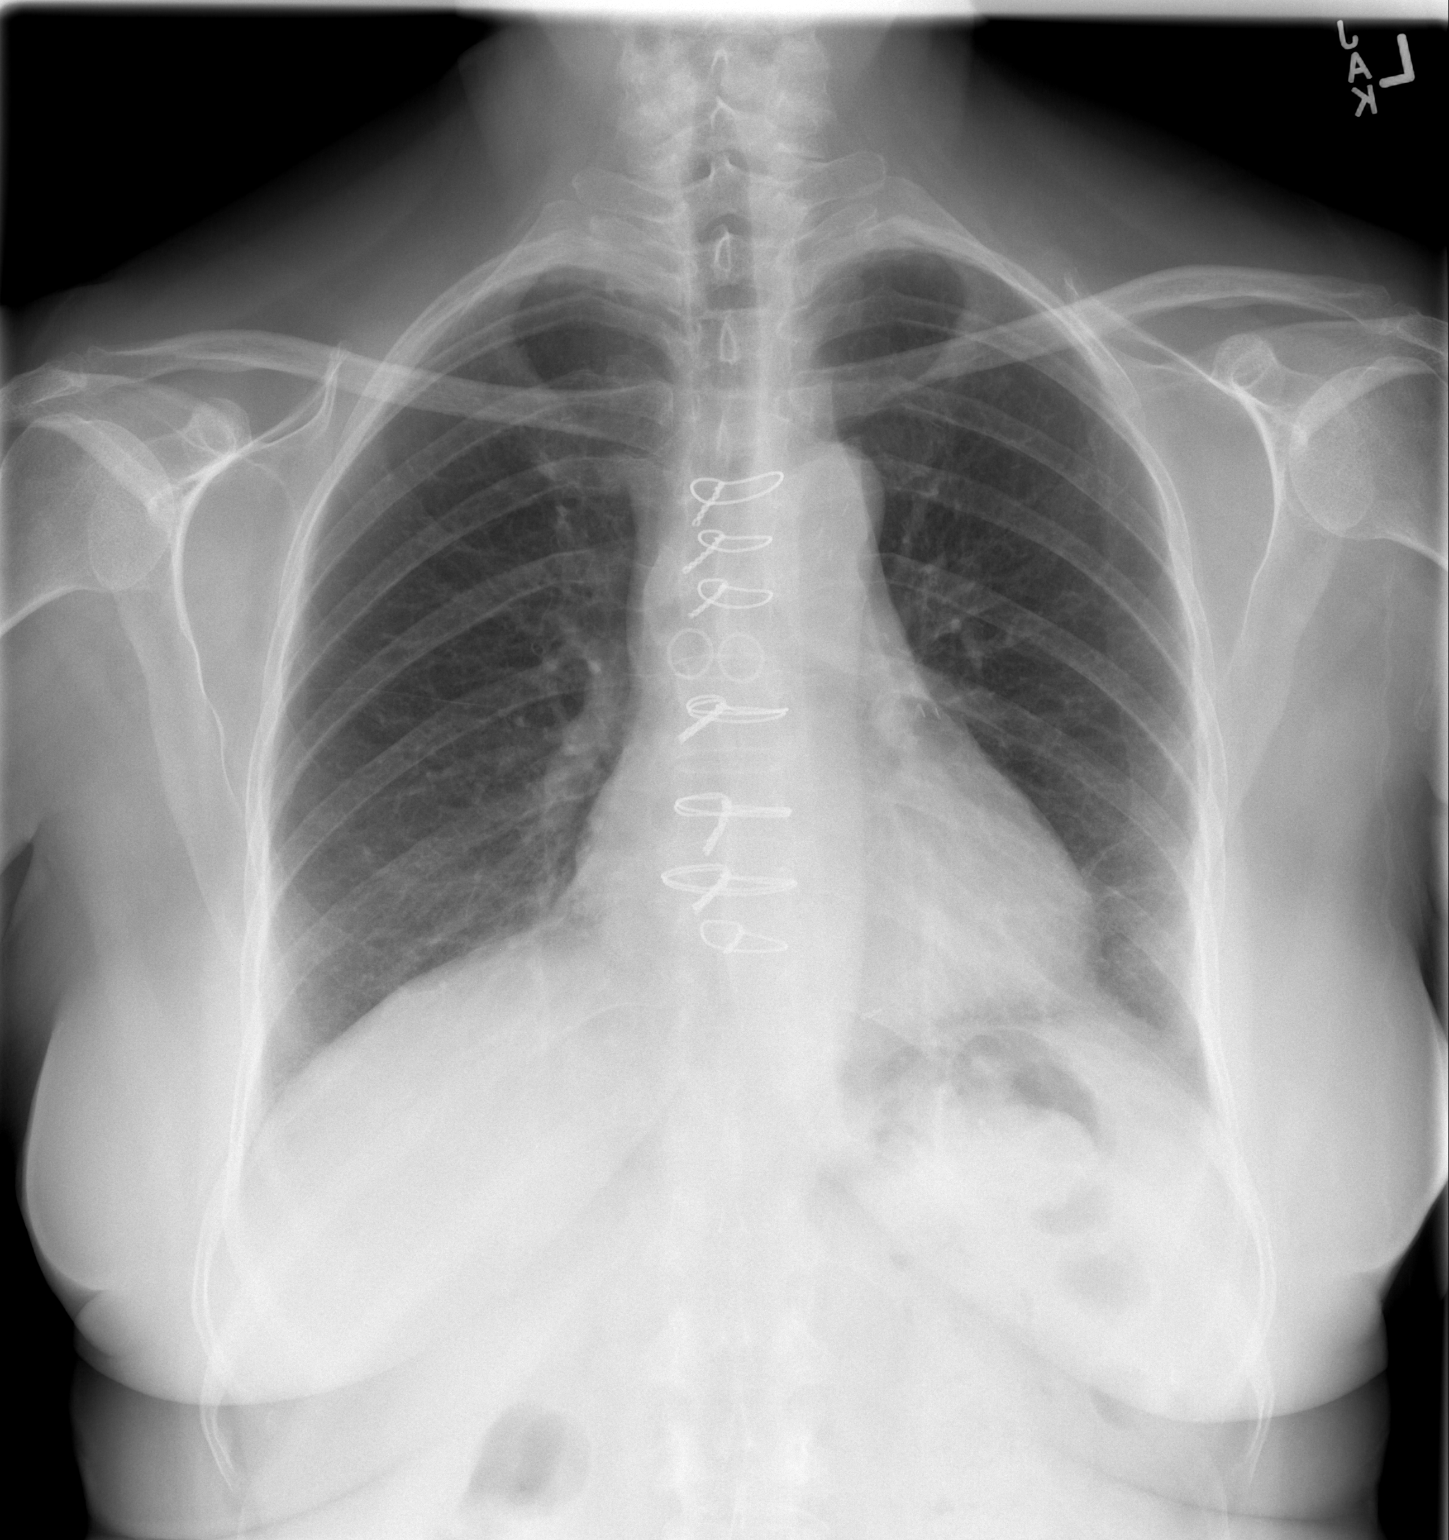

[w chest lat]
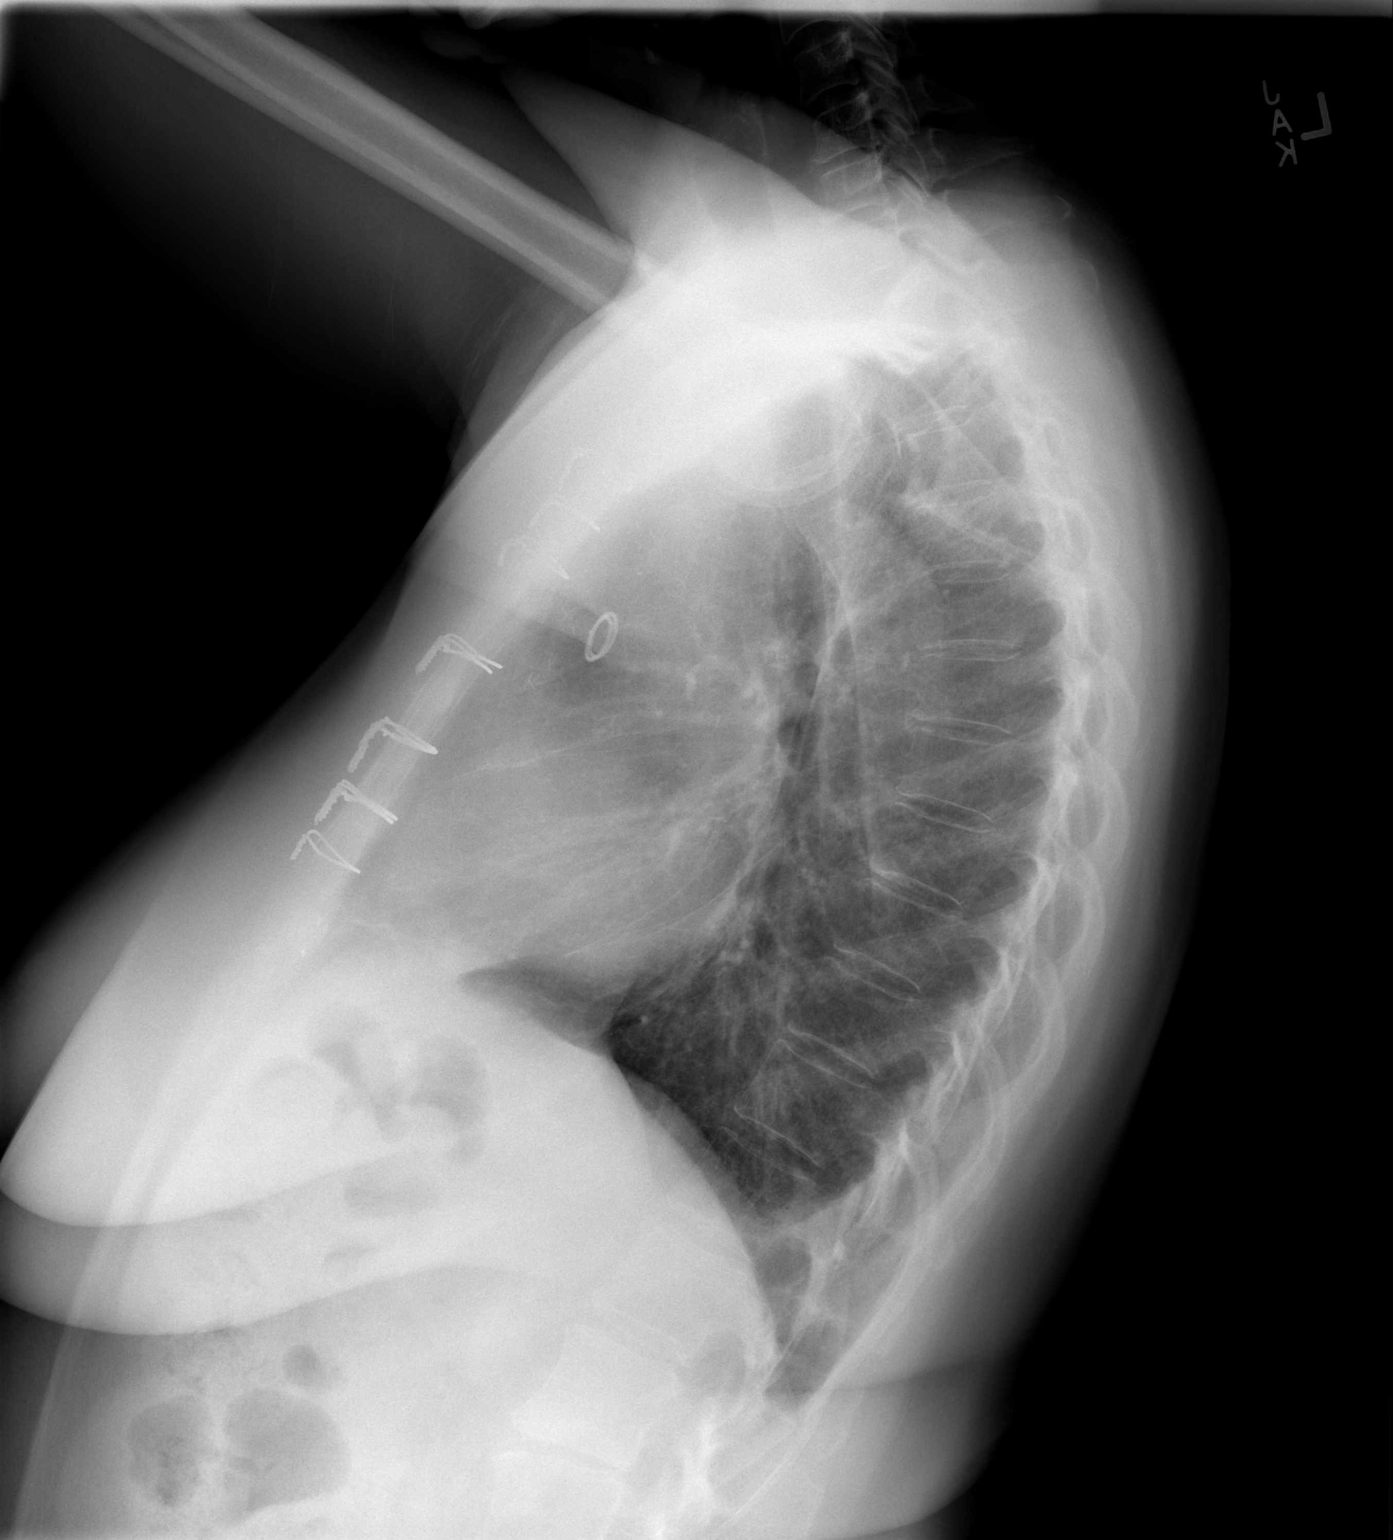

[2 of 2 positions shown; findings below may reference images not displayed]

FINDINGS: Sternotomy wires are unchanged. Lungs are adequately inflated
without focal airspace consolidation or effusion. Cardiomediastinal
silhouette and remainder of the exam is unchanged.
IMPRESSION: No active cardiopulmonary disease.

## 2015-02-08 ENCOUNTER — Other Ambulatory Visit: Payer: Self-pay | Admitting: Internal Medicine

## 2015-02-15 ENCOUNTER — Other Ambulatory Visit: Payer: Self-pay | Admitting: Internal Medicine

## 2015-03-16 ENCOUNTER — Ambulatory Visit (INDEPENDENT_AMBULATORY_CARE_PROVIDER_SITE_OTHER): Payer: Medicare Other | Admitting: Internal Medicine

## 2015-03-16 ENCOUNTER — Encounter: Payer: Self-pay | Admitting: Internal Medicine

## 2015-03-16 VITALS — BP 118/76 | HR 114 | Ht 62.0 in | Wt 144.0 lb

## 2015-03-16 DIAGNOSIS — Z951 Presence of aortocoronary bypass graft: Secondary | ICD-10-CM | POA: Diagnosis not present

## 2015-03-16 DIAGNOSIS — E785 Hyperlipidemia, unspecified: Secondary | ICD-10-CM | POA: Diagnosis not present

## 2015-03-16 DIAGNOSIS — E1159 Type 2 diabetes mellitus with other circulatory complications: Secondary | ICD-10-CM

## 2015-03-16 DIAGNOSIS — R000 Tachycardia, unspecified: Secondary | ICD-10-CM | POA: Diagnosis not present

## 2015-03-16 MED ORDER — CARVEDILOL 25 MG PO TABS: 25.0000 mg | ORAL_TABLET | Freq: Two times a day (BID) | ORAL | Status: DC

## 2015-03-16 MED ORDER — ATORVASTATIN CALCIUM 80 MG PO TABS: 80.0000 mg | ORAL_TABLET | Freq: Every day | ORAL | Status: DC

## 2015-03-16 MED ORDER — LISINOPRIL 2.5 MG PO TABS: 2.5000 mg | ORAL_TABLET | Freq: Every day | ORAL | Status: DC

## 2015-03-16 NOTE — Patient Instructions (Addendum)
Dr Debara Pickett has recommended making the following medication changes: STOP Metoprolol START Carvedilol 25 mg - take 1 tablet by mouth twice daily. A new prescription has been sent to your pharmacy electronically.  Your physician wants you to follow-up in: 6 months with Dr. Debara Pickett. You will receive a reminder letter in the mail two months in advance. If you don't receive a letter, please call our office to schedule the follow-up appointment.  If you need a refill on your cardiac medications before your next appointment, please call your pharmacy.

## 2015-03-16 NOTE — Progress Notes (Signed)
OFFICE NOTE  Chief Complaint:  No complaints  Primary Care Physician: Unice Cobble, MD  HPI:  Kathryn Logan is a 69 year old female with a h/o diet controlled DM but no prior cardiac history who presented to [facility] on 11/26/2013 with chest discomfort with associated nausea and diaphoresis. EKG demonstrated nonspecific T-wave abnormalities. Initial cardiac enzymes were elevated and she ruled in for non-ST elevation myocardial infarction. Subsequently, she underwent a left heart catheterization, perform by Dr. Tamala Julian, which revealed severe coronary artery disease involving the proximal LAD, the first and second marginal branches and a nondominant right coronary artery. She was found to have moderate to severe left ventricular systolic dysfunction with an estimated ejection fraction of 30-35%. However, post-cath 2-D echocardiogram revealed an EF of 40-45%. Surgical revascularization was recommended for her CAD. She ultimately underwent CABG x3 by Dr. Servando Snare on 12/01/2013. She received a LIMA-LAD, SVG-OM2, SVG-dRCA. She had an uncomplicated postoperative course. She was discharged home on 12/05/2013. She was discharged home on aspirin, metoprolol, lisinopril and atorvastatin.  Kathryn Logan was recently seen by Ellen Henri, PA-C., who recommended increasing her beta blocker due to tachycardia. Recently she's been having problems with hypotension, mostly in the morning at cardiac rehabilitation. She is generally asymptomatic with this.  I saw Kathryn Logan back in the office today. Overall she is doing really well. She seems to be maintaining a high normal heart rate in the 80s and 90s. Think a lot of this may be due to anxiety. She's had low blood pressure in the past however I moved her lisinopril to take at night and her blood pressures been more stable. She denies any chest pain or worsening shortness of breath. She's not on any exercise over the past month and has a small  amount of weight gain. She signed up for maintenance cardiac redilatation starting in a couple of days. Hopefully this will help her with weight loss and better cardiac conditioning.  Kathryn Logan returns today for follow-up. Overall she seems to be doing well. She denies any chest pain and only had some mild shortness of breath when walking [place]. She says she can do most activities here without any problems. She continues to have a persistent tachycardia. Heart rate today was close to 120 at rest. She did take her beta blocker and recently that dose had been increased up to 50 mg twice daily of Lopressor. She does not feel fatigued or any different on a higher dose of the medicine and does not perceive any significant change in her heart rate.  PMHx:  Past Medical History  Diagnosis Date  . Osteopenia   . Hyperlipidemia   . Diabetes mellitus without complication ([place])   . Heart disease   . Gallbladder sludge     Past Surgical History  Procedure Laterality Date  . Tonsillectomy and adenoidectomy    . G 1 p 1    . No colonoscopy      [place] reviewed  . Coronary artery bypass graft N/A 12/01/2013    Procedure: CORONARY ARTERY BYPASS GRAFTING (CABG) x three, using left internal mammary artery, and right leg saphenous vein harvested endoscopically (LIMA to LAD, SVG to OM2 , SVG to dRCA);  Surgeon: Grace Isaac, MD;  Location: [place];  Service: Open Heart Surgery;  Laterality: N/A;  . Intraoperative transesophageal echocardiogram N/A 12/01/2013    Procedure: INTRAOPERATIVE TRANSESOPHAGEAL ECHOCARDIOGRAM;  Surgeon: Grace Isaac, MD;  Location: [place];  Service:  Open Heart Surgery;  Laterality: N/A;  . Left heart catheterization with coronary angiogram N/A 11/27/2013    Procedure: LEFT HEART CATHETERIZATION WITH CORONARY ANGIOGRAM;  Surgeon: Sinclair Grooms, MD;  [facility]: [facility] C[facility];  Service: Cardiovascular;  Laterality: N/A;    FAMHx:  Family History  Problem  Relation Age of Onset  . Heart attack Mother 30  . Diabetes Mother   . Stroke Mother   . Hypertension Mother   . Goiter Mother   . Heart attack Father     >55  . Bladder Cancer Father   . Parkinson's disease Maternal Aunt   . Osteoporosis Sister   . Diabetes Brother     SOCHx:   reports that she has never smoked. She does not have any smokeless tobacco history on file. She reports that she drinks alcohol. She reports that she does not use illicit drugs.  ALLERGIES:  No Known Allergies  ROS: A comprehensive review of systems was negative.  HOME MEDS: Current Outpatient Prescriptions  Medication Sig Dispense Refill  . aspirin EC 81 MG tablet Take 81 mg by mouth daily.     [name] atorvastatin (LIPITOR) 80 MG tablet Take 1 tablet (80 mg total) by mouth daily. 30 tablet 11  . BIOTIN PO Take 5,000 mcg by mouth daily.     . bisacodyl (DULCOLAX) 5 MG EC tablet Take 5 mg by mouth daily as needed for moderate constipation.     Mariane Baumgarten Calcium (STOOL SOFTENER PO) Take by mouth as needed.    [name] glucose blood (ONE TOUCH ULTRA TEST) test strip Check blood sugar daily as directed, DX 250.02 100 each 12  . lanolin-mineral oil (BABY OIL) OIL Apply 1 application topically every other day.    . lisinopril (PRINIVIL,ZESTRIL) 2.5 MG tablet Take 1 tablet (2.5 mg total) by mouth daily. 30 tablet 11  . loratadine (CLARITIN) 10 MG tablet Take 1 tablet (10 mg total) by mouth daily. 90 tablet 3  . ONETOUCH DELICA LANCETS FINE MISC 1 each by Does not apply route as directed. Check blood sugar daily as directed, DX 250.02 HOLD UNTIL PATIENT REQUEST 100 each 5  . ranitidine (ZANTAC) 150 MG tablet Take 150 mg by mouth daily. Takes 1 extra as needed.    . vitamin B-12 (CYANOCOBALAMIN) 1000 MCG tablet Take 1,000 mcg by mouth daily.    . carvedilol (COREG) 25 MG tablet Take 1 tablet (25 mg total) by mouth 2 (two) times daily. 60 tablet 11   No current facility-administered medications for this visit.     LABS/IMAGING: No results found for this or any previous visit (from the past 48 hour(s)). No results found.  VITALS: BP 118/76 mmHg  Pulse 114  Ht 5\' 2"  (1.575 m)  Wt 144 lb (65.318 kg)  BMI 26.33 kg/m2  EXAM: General appearance: alert and no distress Neck: no carotid bruit and no JVD Lungs: clear to auscultation bilaterally Heart: regular rate and rhythm, S1, S2 normal, no murmur, click, rub or gallop Abdomen: soft, non-tender; bowel sounds normal; no masses,  no organomegaly Extremities: extremities normal, atraumatic, no cyanosis or edema Pulses: 2+ and symmetric Skin: Skin color, texture, turgor normal. No rashes or lesions Neurologic: Grossly normal Psych: Anxious  EKG: Sinus tachycardia 117   ASSESSMENT: 1. Coronary artery disease status post three-vessel CABG in 2015 (LIMA-LAD, SVG-OM2, SVG-dRCA) 2. DM2 3. Dyslipidemia 4. Inappropriate sinus tachycardia   PLAN: 1.   Kathryn Logan is doing well after bypass surgery. She denies  any worsening chest pain or shortness of breath. she continues to have an inappropriate sinus tachycardia with little if any response to metoprolol. This may be due to an abnormal sinus node function. Since she has normal LV function, she would not be a candidate for ivabradine (selective IKf channel blocker, lowers HR through sinus node, not b-receptors). I think we should try an alternative beta blocker. I'll go ahead and switch her over to carvedilol 25 mg twice a day. She should monitor blood pressure and we may need to stop her low-dose of lisinopril to allow bp room.  cholesterol and diabetes are followed by her primary care provider who she is scheduled to see in the next few weeks.   Plan to see her back in 6 months or sooner as necessary.  Pixie Casino, MD, [facility] Attending Cardiologist [place] 03/16/2015, 12:19 PM

## 2015-04-07 ENCOUNTER — Telehealth: Payer: Self-pay | Admitting: Internal Medicine

## 2015-04-07 ENCOUNTER — Other Ambulatory Visit: Payer: Self-pay | Admitting: Internal Medicine

## 2015-04-07 DIAGNOSIS — E785 Hyperlipidemia, unspecified: Secondary | ICD-10-CM

## 2015-04-07 DIAGNOSIS — E1159 Type 2 diabetes mellitus with other circulatory complications: Secondary | ICD-10-CM

## 2015-04-07 NOTE — Telephone Encounter (Signed)
done

## 2015-04-07 NOTE — Telephone Encounter (Signed)
Called and advised.

## 2015-04-07 NOTE — Telephone Encounter (Signed)
Patient is requesting for CPE labs to be entered. Her appt in next Wednesday, and she'd like to do labs on Tuesday. She asks that i call to let her know the labs are entered.

## 2015-04-13 ENCOUNTER — Other Ambulatory Visit (INDEPENDENT_AMBULATORY_CARE_PROVIDER_SITE_OTHER): Payer: Medicare Other

## 2015-04-13 DIAGNOSIS — E1159 Type 2 diabetes mellitus with other circulatory complications: Secondary | ICD-10-CM | POA: Diagnosis not present

## 2015-04-13 DIAGNOSIS — E785 Hyperlipidemia, unspecified: Secondary | ICD-10-CM | POA: Diagnosis not present

## 2015-04-13 LAB — TSH: TSH: 3.53 u[IU]/mL (ref 0.35–4.50)

## 2015-04-13 LAB — BASIC METABOLIC PANEL
BUN: 17 mg/dL (ref 6–23)
CHLORIDE: 108 meq/L (ref 96–112)
CO2: 25 meq/L (ref 19–32)
Calcium: 9.5 mg/dL (ref 8.4–10.5)
Creatinine, Ser: 0.83 mg/dL (ref 0.40–1.20)
GFR: 72.67 mL/min (ref >60.00)
Glucose, Bld: 166 mg/dL — ABNORMAL HIGH (ref 70–99)
POTASSIUM: 4.5 meq/L (ref 3.5–5.1)
Sodium: 142 mEq/L (ref 135–145)

## 2015-04-13 LAB — MICROALBUMIN / CREATININE URINE RATIO
Creatinine,U: 376.6 mg/dL
Microalb Creat Ratio: 0.8 mg/g (ref 0.0–30.0)
Microalb, Ur: 2.9 mg/dL — ABNORMAL HIGH (ref 0.0–1.9)

## 2015-04-13 LAB — HEPATIC FUNCTION PANEL
ALT: 15 U/L (ref 0–35)
AST: 13 U/L (ref 0–37)
Albumin: 4 g/dL (ref 3.5–5.2)
Alkaline Phosphatase: 90 U/L (ref 39–117)
BILIRUBIN DIRECT: 0.1 mg/dL (ref 0.0–0.3)
BILIRUBIN TOTAL: 0.6 mg/dL (ref 0.2–1.2)
TOTAL PROTEIN: 6.8 g/dL (ref 6.0–8.3)

## 2015-04-13 LAB — LIPID PANEL
CHOL/HDL RATIO: 3
Cholesterol: 135 mg/dL (ref 0–200)
HDL: 39.8 mg/dL (ref >39.00)
LDL CALC: 75 mg/dL (ref 0–99)
NonHDL: 94.77
Triglycerides: 99 mg/dL (ref 0.0–149.0)
VLDL: 19.8 mg/dL (ref 0.0–40.0)

## 2015-04-13 LAB — HEMOGLOBIN A1C: HEMOGLOBIN A1C: 7.9 % — AB (ref 4.6–6.5)

## 2015-04-14 ENCOUNTER — Ambulatory Visit: Payer: PRIVATE HEALTH INSURANCE | Admitting: Internal Medicine

## 2015-04-14 ENCOUNTER — Ambulatory Visit (INDEPENDENT_AMBULATORY_CARE_PROVIDER_SITE_OTHER): Payer: Medicare Other | Admitting: Internal Medicine

## 2015-04-14 ENCOUNTER — Encounter: Payer: Self-pay | Admitting: Internal Medicine

## 2015-04-14 ENCOUNTER — Other Ambulatory Visit: Payer: Self-pay | Admitting: Internal Medicine

## 2015-04-14 ENCOUNTER — Other Ambulatory Visit (INDEPENDENT_AMBULATORY_CARE_PROVIDER_SITE_OTHER): Payer: Medicare Other

## 2015-04-14 ENCOUNTER — Telehealth: Payer: Self-pay | Admitting: Internal Medicine

## 2015-04-14 VITALS — BP 100/68 | HR 86 | Temp 98.4°F | Resp 16 | Ht 62.0 in | Wt 146.8 lb

## 2015-04-14 DIAGNOSIS — E559 Vitamin D deficiency, unspecified: Secondary | ICD-10-CM | POA: Diagnosis not present

## 2015-04-14 DIAGNOSIS — M8580 Other specified disorders of bone density and structure, unspecified site: Secondary | ICD-10-CM | POA: Diagnosis not present

## 2015-04-14 DIAGNOSIS — E785 Hyperlipidemia, unspecified: Secondary | ICD-10-CM

## 2015-04-14 DIAGNOSIS — Z23 Encounter for immunization: Secondary | ICD-10-CM

## 2015-04-14 DIAGNOSIS — E1159 Type 2 diabetes mellitus with other circulatory complications: Secondary | ICD-10-CM

## 2015-04-14 DIAGNOSIS — K219 Gastro-esophageal reflux disease without esophagitis: Secondary | ICD-10-CM | POA: Insufficient documentation

## 2015-04-14 LAB — VITAMIN D 25 HYDROXY (VIT D DEFICIENCY, FRACTURES): VITD: 17.53 ng/mL — ABNORMAL LOW (ref 30.00–100.00)

## 2015-04-14 MED ORDER — RANITIDINE HCL 150 MG PO TABS: ORAL_TABLET | ORAL | Status: DC

## 2015-04-14 MED ORDER — METFORMIN HCL 500 MG PO TABS: ORAL_TABLET | ORAL | Status: DC

## 2015-04-14 MED ORDER — SERTRALINE HCL 25 MG PO TABS: 25.0000 mg | ORAL_TABLET | Freq: Every day | ORAL | Status: DC

## 2015-04-14 NOTE — Progress Notes (Signed)
   Subjective:    Patient ID: Kathryn Logan, female    DOB: 06-28-1945, 69 y.o.   MRN: [id number]  HPI The patient is here to assess status of active health conditions.  PMH, FH, & Social History reviewed & updated.No change in [place] as recorded. She has been compliant with her medications without adverse effects. She does restrict salt; she eats fried foods occasionally. She tries to avoid sweets. She has some red meat in her diet. She's walking half hour almost daily without cardio pulmonary symptoms.  She's not checking her sugars as her glucometer broke. Ophthalmologic exam was done in the Fall and was negative for retinopathy. Her most recent A1c was 7.9%. She denies numbness or tingling in extremities. She has no nonhealing skin lesions.  On 06/12/14  LDL was 75 and HDL 39.8 on 80 mg of Atorvastatin.LFTs were normal.  At the advice of her Dermatologist , she avoids sun. She has Osteopenia on BMD.  Review of Systems  Chest pain, palpitations, tachycardia, exertional dyspnea, paroxysmal nocturnal dyspnea, claudication or edema are absent. No unexplained weight loss, abdominal pain, significant dyspepsia, dysphagia, melena, rectal bleeding, or persistently small caliber stools.She is on Ranitidine and requests refill. Dysuria, pyuria, hematuria, frequency, nocturia or polyuria are denied. Change in hair, skin, nails denied. No bowel changes of constipation or diarrhea. No intolerance to heat or cold.     Objective:   Physical Exam  General appearance :adequately nourished; in no distress.  Eyes: No conjunctival inflammation or scleral icterus is present.  Oral exam:  Lips and gums are healthy appearing.There is no oropharyngeal erythema or exudate noted. Dental hygiene is good.  Heart:  Normal rate and regular rhythm. S1 and S2 normal without gallop, murmur, click, rub or other extra sounds    Lungs:Chest clear to auscultation; no wheezes, rhonchi,rales ,or rubs present.No increased  work of breathing.   Abdomen: Slightly protuberant abdomen; bowel sounds normal, soft and non-tender without masses, organomegaly or hernias noted.  No guarding or rebound.   Vascular : all pulses equal ; no bruits present.  Skin:Warm & dry.  Intact without suspicious lesions or rashes ; no tenting or jaundice   Lymphatic: No lymphadenopathy is noted about the head, neck, axilla.   Neuro: Strength, tone & DTRs normal.      Assessment & Plan:  See Current Assessment & Plan in Problem List under specific Diagnosis

## 2015-04-14 NOTE — Patient Instructions (Signed)
HDL goal is > 40 in men & > 50 in women.Interventions to raise HDL or GOOD cholesterol include: exercising 30-45 minutes 3-4 X per week ; including dietary sources of Omega 3 fatty acids ( salmon & tuna) or  supplementing with Flax seed  1-2 grams per day. The B vitamin Niacin raises HDL but has not been shown to decrease heart attack or stroke risks in extensive trials & is not recommended.  The following nutritional changes may help prevent Diabetes progression & complications.  White carbohydrates (potatoes, rice, bread, and pasta) cause a high spike of the sugar level which stays elevated for a significant period of time (called sugar"load").  For example a  baked potato has a cup of sugar and a  french fry  2 teaspoons of sugar.  More complex carbs such as yams, wild  rice, whole grained bread &  wheat pasta have been much lower spike and persistent load of sugar than the white carbs. The pancreas excretes excess insulin in response to the high spike & load of sugar . Over time the pancreas can actually run out of insulin necessitating insulin shots.

## 2015-04-14 NOTE — Assessment & Plan Note (Signed)
Please follow a Mediaterranean type diet  (many good cook books readily available) or review Dr Nunzio Cory book Eat, [place] for best  dietary cholesterol information & options.   Continue excellent cardiovascular exercise program 30-45 minutes @ least 3-4 times per week. HDL goal is > 40 in men & > 50 in women.Interventions to raise HDL or GOOD cholesterol include: exercising 30-45 minutes 3-4 X per week ; including dietary sources of Omega 3 fatty acids ( salmon & tuna) or  supplementing with Flax seed  1-2 grams per day. The B vitamin Niacin raises HDL but has not been shown to decrease heart attack or stroke risks in extensive trials & is not recommended.

## 2015-04-14 NOTE — Assessment & Plan Note (Signed)
Low carb diet Add Metformin  A1c 3 months

## 2015-04-14 NOTE — Progress Notes (Signed)
Pre visit review using our clinic review tool, if applicable. No additional management support is needed unless otherwise documented below in the visit note. 

## 2015-04-14 NOTE — Assessment & Plan Note (Signed)
Vitamin D level 

## 2015-04-14 NOTE — Telephone Encounter (Signed)
Pt was just in and she was expecting to have a lab order for urine protein test before her next appt. Just checking on this

## 2015-04-14 NOTE — Assessment & Plan Note (Signed)
The triggers for reflux  include stress; the "aspirin family" ; alcohol; peppermint; and caffeine (coffee, tea, cola, and chocolate). The aspirin family would include aspirin and the nonsteroidal agents such as ibuprofen &  Naproxen. Tylenol would not cause reflux. If having symptoms ; food & drink should be avoided for @ least 2 hours before going to bed.  Renew Zantac

## 2015-04-27 ENCOUNTER — Telehealth: Payer: Self-pay

## 2015-04-27 NOTE — Telephone Encounter (Signed)
Left Voice Mail for pt to call back.   RE: Flu Vaccine for 2016  

## 2015-05-14 ENCOUNTER — Telehealth: Payer: Self-pay | Admitting: Internal Medicine

## 2015-05-14 NOTE — Telephone Encounter (Signed)
Several medications she can try depending on her symptoms  General Recommendations:    Please drink plenty of fluids.  Get plenty of rest   Sleep in humidified air  Use saline nasal sprays  Netti pot  OTC Medications:  Decongestants - helps relieve congestion   Flonase (generic fluticasone) or Nasacort (generic triamcinolone) - please make sure to use the "cross-over" technique at a 45 degree angle towards the opposite eye as opposed to straight up the nasal passageway.   Sudafed (generic pseudoephedrine - Note this is the one that is available behind the pharmacy counter); Products with phenylephrine (-PE) may also be used but is often not as effective as pseudoephedrine.   If you have HIGH BLOOD PRESSURE - Coricidin HBP; AVOID any product that is -D as this contains pseudoephedrine which may increase your blood pressure.  Afrin (oxymetazoline) every 6-8 hours for up to 3 days.    Cough -   Delsym or Robitussin (generic dextromethorphan)  Expectorants - helps loosen mucus to ease removal   Mucinex (generic guaifenesin) as directed on the package.  Headaches / General Aches   Tylenol (generic acetaminophen)   Advil/Motrin (generic ibuprofen)  Sore Throat -   Salt water gargle   Chloraseptic (generic benzocaine) spray or lozenges / Sucrets (generic dyclonine)

## 2015-05-14 NOTE — Telephone Encounter (Signed)
Spoke with pt to inform that response was sent via MyChart do to the length of MDs response.

## 2015-05-14 NOTE — Telephone Encounter (Signed)
Pt called in and has a head cold.  She is not sure what is ok for her to take over the counter.  Can you call her when you get a chance.  Best number is her cell number

## 2015-05-14 NOTE — Telephone Encounter (Signed)
Please advise 

## 2015-07-14 ENCOUNTER — Ambulatory Visit: Payer: PRIVATE HEALTH INSURANCE | Admitting: Internal Medicine

## 2015-08-09 ENCOUNTER — Other Ambulatory Visit: Payer: Self-pay | Admitting: Internal Medicine

## 2015-08-20 ENCOUNTER — Other Ambulatory Visit: Payer: Self-pay | Admitting: Internal Medicine

## 2015-09-22 ENCOUNTER — Ambulatory Visit: Payer: PRIVATE HEALTH INSURANCE | Admitting: Internal Medicine

## 2015-10-08 ENCOUNTER — Other Ambulatory Visit: Payer: Self-pay | Admitting: Internal Medicine

## 2015-10-20 ENCOUNTER — Ambulatory Visit: Payer: PRIVATE HEALTH INSURANCE | Admitting: Internal Medicine

## 2015-11-05 ENCOUNTER — Other Ambulatory Visit: Payer: Self-pay | Admitting: Internal Medicine

## 2015-11-30 ENCOUNTER — Ambulatory Visit: Payer: PRIVATE HEALTH INSURANCE | Admitting: Internal Medicine

## 2015-12-03 ENCOUNTER — Other Ambulatory Visit: Payer: Self-pay | Admitting: Internal Medicine

## 2015-12-03 NOTE — Telephone Encounter (Signed)
Pt has cancelled appt to est 4 times, new appt is not until Oct. Please advise if okay to refill.

## 2015-12-10 ENCOUNTER — Other Ambulatory Visit: Payer: Self-pay | Admitting: Internal Medicine

## 2016-01-07 ENCOUNTER — Other Ambulatory Visit: Payer: Self-pay | Admitting: Internal Medicine

## 2016-01-07 DIAGNOSIS — K219 Gastro-esophageal reflux disease without esophagitis: Secondary | ICD-10-CM

## 2016-02-21 DIAGNOSIS — I2510 Atherosclerotic heart disease of native coronary artery without angina pectoris: Secondary | ICD-10-CM | POA: Insufficient documentation

## 2016-02-21 NOTE — Patient Instructions (Addendum)
Have an eye exam done.  Please have them send [place] a report.   Call your insurance company and ask about cologuard.   Test(s) ordered today. Your results will be released to [place] (or called to you) after review, usually within 72hours after test completion. If any changes need to be made, you will be notified at that same time.  All other Health Maintenance issues reviewed.   All recommended immunizations and age-appropriate screenings are up-to-date or discussed.  Flu vaccine administered today.   Medications reviewed and updated.  No changes recommended at this time.  Your prescription(s) have been submitted to your pharmacy. Please take as directed and contact our office if you believe you are having problem(s) with the medication(s).   Please followup in 6 months

## 2016-02-21 NOTE — Progress Notes (Signed)
Subjective:    Patient ID: Kathryn Logan, female    DOB: [birth year], 70 y.o.   MRN: [id number]  HPI She is here to establish with a new pcp.  She is here for follow up.  Diabetes: She is taking her medication daily as prescribed. She is compliant with a diabetic diet. She is not exercising regularly - typically walks. She monitors her sugars and they have been running 107-125. She denies hypoglycemia.  She checks her feet daily and denies foot lesions. She is up-to-date with an ophthalmology examination.   CAD, s/p NSTEMI, s/p CABG:  She is taking all of her medications as prescribed.  She denies any chest pain, palpitations, shortness of breath and lower extremity edema. She typically walks regularly, but has not been exercising regularly recently.  GERD:  She is taking her medication daily as prescribed.  She denies any GERD symptoms and feels her GERD is well controlled.   Hyperlipidemia: She is taking her medication daily. She is compliant with a low fat/cholesterol diet. She is not exercising regularly. She denies myalgias.   Anxiety: She is taking her medication daily as prescribed. She denies any side effects from the medication. She feels her anxiety is well controlled and she is happy with her current dose of medication.    Medications and allergies reviewed with patient and updated if appropriate.  Patient Active Problem List   Diagnosis Date Noted  . Vitamin D deficiency 02/22/2016  . Anxiety 02/22/2016  . CAD (coronary artery disease) 02/21/2016  . GERD (gastroesophageal reflux disease) 04/14/2015  . Inappropriate sinus tachycardia 03/16/2015  . S/P CABG x 3 12/01/2013  . NSTEMI (non-ST elevated myocardial infarction) ([place]) 11/26/2013  . Type 2 diabetes mellitus with vascular disease ([place]) 08/28/2012  . Hyperlipidemia 12/18/2006  . Osteopenia 10/09/2006    Current Outpatient Prescriptions on File Prior to Visit  Medication Sig Dispense Refill  . aspirin EC 81  MG tablet Take 81 mg by mouth daily.     [name] atorvastatin (LIPITOR) 80 MG tablet Take 1 tablet (80 mg total) by mouth daily. 30 tablet 11  . BIOTIN PO Take 5,000 mcg by mouth daily.     . bisacodyl (DULCOLAX) 5 MG EC tablet Take 5 mg by mouth daily as needed for moderate constipation.     . carvedilol (COREG) 25 MG tablet Take 1 tablet (25 mg total) by mouth 2 (two) times daily. 60 tablet 11  . Docusate Calcium (STOOL SOFTENER PO) Take by mouth as needed.    [name] glucose blood (ONE TOUCH ULTRA TEST) test strip Check blood sugar daily as directed, DX 250.02 100 each 12  . lanolin-mineral oil (BABY OIL) OIL Apply 1 application topically every other day.    . lisinopril (PRINIVIL,ZESTRIL) 2.5 MG tablet Take 1 tablet (2.5 mg total) by mouth daily. 30 tablet 11  . ONETOUCH DELICA LANCETS FINE MISC 1 each by Does not apply route as directed. Check blood sugar daily as directed, DX 250.02 HOLD UNTIL PATIENT REQUEST 100 each 5  . vitamin B-12 (CYANOCOBALAMIN) 1000 MCG tablet Take 1,000 mcg by mouth daily.     No current facility-administered medications on file prior to visit.     Past Medical History:  Diagnosis Date  . Diabetes mellitus without complication ([place])   . Gallbladder sludge   . Heart disease   . Hyperlipidemia   . Osteopenia     Past Surgical History:  Procedure Laterality Date  . CORONARY ARTERY BYPASS GRAFT  N/A 12/01/2013   Procedure: CORONARY ARTERY BYPASS GRAFTING (CABG) x three, using left internal mammary artery, and right leg saphenous vein harvested endoscopically (LIMA to LAD, SVG to OM2 , SVG to dRCA);  Surgeon: Grace Isaac, MD;  Location: [facility];  Service: Open Heart Surgery;  Laterality: N/A;  . G 1 P 1    . INTRAOPERATIVE TRANSESOPHAGEAL ECHOCARDIOGRAM N/A 12/01/2013   Procedure: INTRAOPERATIVE TRANSESOPHAGEAL ECHOCARDIOGRAM;  Surgeon: Grace Isaac, MD;  Location: [facility];  Service: Open Heart Surgery;  Laterality: N/A;  . LEFT HEART CATHETERIZATION WITH CORONARY  ANGIOGRAM N/A 11/27/2013   Procedure: LEFT HEART CATHETERIZATION WITH CORONARY ANGIOGRAM;  Surgeon: Sinclair Grooms, MD;  Location: [facility] CATH LAB;  Service: Cardiovascular;  Laterality: N/A;  . no colonoscopy     SOC reviewed  . TONSILLECTOMY AND ADENOIDECTOMY      Social History   Social History  . Marital status: Married    Spouse name: N/A  . Number of children: N/A  . Years of education: N/A   Social History Main Topics  . Smoking status: Never Smoker  . Smokeless tobacco: None  . Alcohol use Yes     Comment: Occasionally   . Drug use: No  . Sexual activity: Not Asked   Other Topics Concern  . None   Social History Narrative  . None    Family History  Problem Relation Age of Onset  . Heart attack Mother 73  . Diabetes Mother   . Stroke Mother   . Hypertension Mother   . Goiter Mother   . Heart attack Father     >55  . Bladder Cancer Father   . Parkinson's disease Maternal Aunt   . Osteoporosis Sister   . Diabetes Brother     Review of Systems  Constitutional: Negative for chills and fever.  Eyes: Negative for visual disturbance.  Respiratory: Negative for cough, shortness of breath and wheezing.   Cardiovascular: Negative for chest pain, palpitations and leg swelling.  Gastrointestinal: Positive for constipation (occ). Negative for abdominal pain, blood in stool, diarrhea and nausea.  Endocrine: Negative for polydipsia and polyuria.  Neurological: Negative for dizziness, light-headedness, numbness and headaches.  Psychiatric/Behavioral: Negative for dysphoric mood. The patient is nervous/anxious (controlled).        Objective:   Vitals:   02/22/16 0756  BP: 116/78  Pulse: 68  Resp: 16  Temp: 97.7 F (36.5 C)   Filed Weights   02/22/16 0756  Weight: 142 lb (64.4 kg)   Body mass index is 25.97 kg/m.   Physical Exam Constitutional: Appears well-developed and well-nourished. No distress.  HENT:  Head: Normocephalic and atraumatic.  Neck:  Neck supple. No tracheal deviation present. No thyromegaly present.  No cervical lymphadenopathy Cardiovascular: Normal rate, regular rhythm and normal heart sounds.   No murmur heard. No carotid bruit .  No edema Pulmonary/Chest: Effort normal and breath sounds normal. No respiratory distress. No has no wheezes. No rales.  Skin: Skin is warm and dry. Not diaphoretic.  Psychiatric: Normal mood and affect. Behavior is normal.          Assessment & Plan:   See Problem List for Assessment and Plan of chronic medical problems.  F/u in 6 months

## 2016-02-22 ENCOUNTER — Ambulatory Visit (INDEPENDENT_AMBULATORY_CARE_PROVIDER_SITE_OTHER): Payer: Medicare Other | Admitting: Internal Medicine

## 2016-02-22 ENCOUNTER — Encounter: Payer: Self-pay | Admitting: Internal Medicine

## 2016-02-22 VITALS — BP 116/78 | HR 68 | Temp 97.7°F | Resp 16 | Ht 62.0 in | Wt 142.0 lb

## 2016-02-22 DIAGNOSIS — K219 Gastro-esophageal reflux disease without esophagitis: Secondary | ICD-10-CM

## 2016-02-22 DIAGNOSIS — I25119 Atherosclerotic heart disease of native coronary artery with unspecified angina pectoris: Secondary | ICD-10-CM | POA: Diagnosis not present

## 2016-02-22 DIAGNOSIS — M85869 Other specified disorders of bone density and structure, unspecified lower leg: Secondary | ICD-10-CM

## 2016-02-22 DIAGNOSIS — E1159 Type 2 diabetes mellitus with other circulatory complications: Secondary | ICD-10-CM | POA: Diagnosis not present

## 2016-02-22 DIAGNOSIS — E7800 Pure hypercholesterolemia, unspecified: Secondary | ICD-10-CM

## 2016-02-22 DIAGNOSIS — F419 Anxiety disorder, unspecified: Secondary | ICD-10-CM | POA: Insufficient documentation

## 2016-02-22 DIAGNOSIS — E559 Vitamin D deficiency, unspecified: Secondary | ICD-10-CM

## 2016-02-22 DIAGNOSIS — Z1159 Encounter for screening for other viral diseases: Secondary | ICD-10-CM

## 2016-02-22 DIAGNOSIS — F32A Depression, unspecified: Secondary | ICD-10-CM | POA: Insufficient documentation

## 2016-02-22 DIAGNOSIS — Z23 Encounter for immunization: Secondary | ICD-10-CM

## 2016-02-22 MED ORDER — SERTRALINE HCL 25 MG PO TABS: 25.0000 mg | ORAL_TABLET | Freq: Every day | ORAL | 1 refills | Status: DC

## 2016-02-22 MED ORDER — RANITIDINE HCL 150 MG PO TABS: 150.0000 mg | ORAL_TABLET | Freq: Two times a day (BID) | ORAL | 1 refills | Status: DC

## 2016-02-22 MED ORDER — VITAMIN D-3 25 MCG (1000 UT) PO CAPS: 1.0000 | ORAL_CAPSULE | Freq: Every day | ORAL | Status: DC

## 2016-02-22 MED ORDER — METFORMIN HCL 500 MG PO TABS: ORAL_TABLET | ORAL | 1 refills | Status: DC

## 2016-02-22 MED ORDER — LORATADINE 10 MG PO TABS: 10.0000 mg | ORAL_TABLET | Freq: Every day | ORAL | 3 refills | Status: AC

## 2016-02-22 NOTE — Assessment & Plan Note (Signed)
Check a1c, urine micro Stressed regular exercise Make eye appt Compliant with diabetic diet, sugars controlled at home Follow up in 6 months

## 2016-02-22 NOTE — Progress Notes (Signed)
Pre visit review using our clinic review tool, if applicable. No additional management support is needed unless otherwise documented below in the visit note. 

## 2016-02-22 NOTE — Assessment & Plan Note (Signed)
Check lipid panel, CMP  continue statin

## 2016-02-22 NOTE — Assessment & Plan Note (Signed)
Controlled, stable Continue current dose of medication  

## 2016-02-22 NOTE — Assessment & Plan Note (Signed)
Check level 

## 2016-02-22 NOTE — Assessment & Plan Note (Signed)
GERD controlled Continue daily medication  

## 2016-02-22 NOTE — Assessment & Plan Note (Signed)
Following with cardio No concerning symptoms Continue current medications Check labs Stressed regular exercise

## 2016-02-22 NOTE — Assessment & Plan Note (Signed)
DEXA-we will discuss at next visit Getting calcium from diet Taking vitamin D daily Check vitamin D level-was low last year Stressed regular exercise

## 2016-02-29 ENCOUNTER — Other Ambulatory Visit: Payer: Self-pay

## 2016-02-29 MED ORDER — LISINOPRIL 2.5 MG PO TABS: 2.5000 mg | ORAL_TABLET | Freq: Every day | ORAL | 0 refills | Status: DC

## 2016-03-09 ENCOUNTER — Other Ambulatory Visit: Payer: Self-pay | Admitting: Internal Medicine

## 2016-03-10 NOTE — Telephone Encounter (Signed)
Rx(s) sent to pharmacy electronically.  

## 2016-03-16 ENCOUNTER — Other Ambulatory Visit: Payer: Self-pay | Admitting: Internal Medicine

## 2016-03-16 NOTE — Telephone Encounter (Signed)
Rx(s) sent to pharmacy electronically.  

## 2016-04-11 ENCOUNTER — Telehealth: Payer: Self-pay | Admitting: Emergency Medicine

## 2016-04-11 NOTE — Telephone Encounter (Signed)
Pt called and states she has a cold. She has a stopped up head, scratchy throat and a little runny nose that is draining down the back of her throat. She wants to know what you would recommend her taking over the counter. Please advise thanks.

## 2016-04-11 NOTE — Telephone Encounter (Signed)
Tylenol, mucinex, flonase, saline nasal sprays

## 2016-04-11 NOTE — Telephone Encounter (Signed)
Please advise 

## 2016-04-11 NOTE — Telephone Encounter (Signed)
Spoke with pt to inform of MDs response.  

## 2016-04-13 ENCOUNTER — Other Ambulatory Visit: Payer: Self-pay | Admitting: *Deleted

## 2016-04-13 ENCOUNTER — Telehealth: Payer: Self-pay | Admitting: Internal Medicine

## 2016-04-13 MED ORDER — CARVEDILOL 25 MG PO TABS: 25.0000 mg | ORAL_TABLET | Freq: Two times a day (BID) | ORAL | 0 refills | Status: DC

## 2016-04-13 NOTE — Telephone Encounter (Signed)
°*  STAT* If patient is at the pharmacy, call can be transferred to refill team.   1. Which medications need to be refilled? (please list name of each medication and dose if known)  Carvedilol 2. Which pharmacy/location (including street and city if local pharmacy) is medication to be sent to?[facility] [phone]  3. Do they need a 30 day or 90 day supply? 180 and refills

## 2016-04-14 ENCOUNTER — Other Ambulatory Visit: Payer: Self-pay | Admitting: Internal Medicine

## 2016-05-19 ENCOUNTER — Ambulatory Visit: Payer: Medicare Other | Admitting: Internal Medicine

## 2016-06-02 ENCOUNTER — Other Ambulatory Visit: Payer: Self-pay

## 2016-06-02 MED ORDER — LISINOPRIL 2.5 MG PO TABS: 2.5000 mg | ORAL_TABLET | Freq: Every day | ORAL | 0 refills | Status: DC

## 2016-06-02 MED ORDER — CARVEDILOL 25 MG PO TABS: 25.0000 mg | ORAL_TABLET | Freq: Two times a day (BID) | ORAL | 0 refills | Status: DC

## 2016-06-02 MED ORDER — ATORVASTATIN CALCIUM 80 MG PO TABS: 80.0000 mg | ORAL_TABLET | Freq: Every day | ORAL | 0 refills | Status: DC

## 2016-06-09 ENCOUNTER — Encounter: Payer: Self-pay | Admitting: Internal Medicine

## 2016-06-09 ENCOUNTER — Ambulatory Visit (INDEPENDENT_AMBULATORY_CARE_PROVIDER_SITE_OTHER): Payer: Medicare Other | Admitting: Internal Medicine

## 2016-06-09 VITALS — BP 116/68 | HR 88 | Ht 62.0 in | Wt 139.6 lb

## 2016-06-09 DIAGNOSIS — E7800 Pure hypercholesterolemia, unspecified: Secondary | ICD-10-CM

## 2016-06-09 DIAGNOSIS — R000 Tachycardia, unspecified: Secondary | ICD-10-CM

## 2016-06-09 DIAGNOSIS — Z951 Presence of aortocoronary bypass graft: Secondary | ICD-10-CM | POA: Diagnosis not present

## 2016-06-09 DIAGNOSIS — I4711 Inappropriate sinus tachycardia, so stated: Secondary | ICD-10-CM

## 2016-06-09 NOTE — Patient Instructions (Signed)
Your physician wants you to follow-up in: ONE YEAR with Dr. Hilty. You will receive a reminder letter in the mail two months in advance. If you don't receive a letter, please call our office to schedule the follow-up appointment.  

## 2016-06-09 NOTE — Progress Notes (Signed)
OFFICE NOTE  Chief Complaint:  No complaints  Primary Care Physician: Binnie Rail, MD  HPI:  Kathryn Logan is a 71 year old female with a h/o diet controlled DM but no prior cardiac history who presented to [facility] on 11/26/2013 with chest discomfort with associated nausea and diaphoresis. EKG demonstrated nonspecific T-wave abnormalities. Initial cardiac enzymes were elevated and she ruled in for non-ST elevation myocardial infarction. Subsequently, she underwent a left heart catheterization, perform by Dr. Tamala Julian, which revealed severe coronary artery disease involving the proximal LAD, the first and second marginal branches and a nondominant right coronary artery. She was found to have moderate to severe left ventricular systolic dysfunction with an estimated ejection fraction of 30-35%. However, post-cath 2-D echocardiogram revealed an EF of 40-45%. Surgical revascularization was recommended for her CAD. She ultimately underwent CABG x3 by Dr. Servando Snare on 12/01/2013. She received a LIMA-LAD, SVG-OM2, SVG-dRCA. She had an uncomplicated postoperative course. She was discharged home on 12/05/2013. She was discharged home on aspirin, metoprolol, lisinopril and atorvastatin.  Kathryn Logan was recently seen by Ellen Henri, PA-C., who recommended increasing her beta blocker due to tachycardia. Recently she's been having problems with hypotension, mostly in the morning at cardiac rehabilitation. She is generally asymptomatic with this.  I saw Kathryn Logan back in the office today. Overall she is doing really well. She seems to be maintaining a high normal heart rate in the 80s and 90s. Think a lot of this may be due to anxiety. She's had low blood pressure in the past however I moved her lisinopril to take at night and her blood pressures been more stable. She denies any chest pain or worsening shortness of breath. She's not on any exercise over the past month and has a small  amount of weight gain. She signed up for maintenance cardiac redilatation starting in a couple of days. Hopefully this will help her with weight loss and better cardiac conditioning.  Kathryn Logan returns today for follow-up. Overall she seems to be doing well. She denies any chest pain and only had some mild shortness of breath when walking [place]. She says she can do most activities here without any problems. She continues to have a persistent tachycardia. Heart rate today was close to 120 at rest. She did take her beta blocker and recently that dose had been increased up to 50 mg twice daily of Lopressor. She does not feel fatigued or any different on a higher dose of the medicine and does not perceive any significant change in her heart rate.  06/09/2016  Kathryn Logan was seen today in follow-up. Over the past year she's done very well. She denies any chest pain or worsening shortness of breath. She notes that the change to carvedilol has helped her tachycardia. She is due for some repeat lab work with his which was ordered by Dr. Quay Burow but she has not yet had that drawn.  PMHx:  Past Medical History:  Diagnosis Date  . Diabetes mellitus without complication ([place])   . Gallbladder sludge   . Heart disease   . Hyperlipidemia   . Osteopenia     Past Surgical History:  Procedure Laterality Date  . CORONARY ARTERY BYPASS GRAFT N/A 12/01/2013   Procedure: CORONARY ARTERY BYPASS GRAFTING (CABG) x three, using left internal mammary artery, and right leg saphenous vein harvested endoscopically (LIMA to LAD, SVG to OM2 , SVG to dRCA);  Surgeon: Grace Isaac, MD;  Location: [facility];  Service: Open Heart Surgery;  Laterality: N/A;  . G 1 P 1    . INTRAOPERATIVE TRANSESOPHAGEAL ECHOCARDIOGRAM N/A 12/01/2013   Procedure: INTRAOPERATIVE TRANSESOPHAGEAL ECHOCARDIOGRAM;  Surgeon: Grace Isaac, MD;  Location: [place];  Service: Open Heart Surgery;  Laterality: N/A;  . LEFT HEART  CATHETERIZATION WITH CORONARY ANGIOGRAM N/A 11/27/2013   Procedure: LEFT HEART CATHETERIZATION WITH CORONARY ANGIOGRAM;  Surgeon: Sinclair Grooms, MD;  Location: [facility] CATH LAB;  Service: Cardiovascular;  Laterality: N/A;  . no colonoscopy     SOC reviewed  . TONSILLECTOMY AND ADENOIDECTOMY      FAMHx:  Family History  Problem Relation Age of Onset  . Heart attack Mother 39  . Diabetes Mother   . Stroke Mother   . Hypertension Mother   . Goiter Mother   . Heart attack Father     >55  . Bladder Cancer Father   . Parkinson's disease Maternal Aunt   . Osteoporosis Sister   . Diabetes Brother     SOCHx:   reports that she has never smoked. She has never used smokeless tobacco. She reports that she drinks alcohol. She reports that she does not use drugs.  ALLERGIES:  No Known Allergies  ROS: A comprehensive review of systems was negative.  HOME MEDS: Current Outpatient Prescriptions  Medication Sig Dispense Refill  . aspirin EC 81 MG tablet Take 81 mg by mouth daily.     [name] atorvastatin (LIPITOR) 80 MG tablet Take 1 tablet (80 mg total) by mouth daily. 30 tablet 0  . BIOTIN PO Take 5,000 mcg by mouth daily.     . bisacodyl (DULCOLAX) 5 MG EC tablet Take 5 mg by mouth daily as needed for moderate constipation.     . carvedilol (COREG) 25 MG tablet Take 1 tablet (25 mg total) by mouth 2 (two) times daily. 60 tablet 0  . Cholecalciferol (VITAMIN D-3) 1000 units CAPS Take 1 capsule (1,000 Units total) by mouth daily. 60 capsule   . Docusate Calcium (STOOL SOFTENER PO) Take by mouth as needed.    [name] glucose blood (ONE TOUCH ULTRA TEST) test strip Check blood sugar daily as directed, DX 250.02 100 each 12  . lanolin-mineral oil (BABY OIL) OIL Apply 1 application topically every other day.    . lisinopril (PRINIVIL,ZESTRIL) 2.5 MG tablet Take 1 tablet (2.5 mg total) by mouth daily. 30 tablet 0  . loratadine (CLARITIN) 10 MG tablet Take 1 tablet (10 mg total) by mouth daily. 90 tablet 3    . metFORMIN (GLUCOPHAGE) 500 MG tablet Take one tablet by mouth once daily after largest meal 90 tablet 1  . ONETOUCH DELICA LANCETS FINE MISC 1 each by Does not apply route as directed. Check blood sugar daily as directed, DX 250.02 HOLD UNTIL PATIENT REQUEST 100 each 5  . ranitidine (ZANTAC) 150 MG tablet Take 1 tablet (150 mg total) by mouth every 12 (twelve) hours. 180 tablet 1  . sertraline (ZOLOFT) 25 MG tablet Take 1 tablet (25 mg total) by mouth daily. 90 tablet 1  . vitamin B-12 (CYANOCOBALAMIN) 1000 MCG tablet Take 1,000 mcg by mouth daily.     No current facility-administered medications for this visit.     LABS/IMAGING: No results found for this or any previous visit (from the past 48 hour(s)). No results found.  VITALS: BP 116/68   Pulse 88   Ht 5\' 2"  (1.575 m)   Wt 139 lb 9.6 oz (63.3  kg)   BMI 25.53 kg/m   EXAM: General appearance: alert and no distress Neck: no carotid bruit and no JVD Lungs: clear to auscultation bilaterally Heart: regular rate and rhythm, S1, S2 normal, no murmur, click, rub or gallop Abdomen: soft, non-tender; bowel sounds normal; no masses,  no organomegaly Extremities: extremities normal, atraumatic, no cyanosis or edema Pulses: 2+ and symmetric Skin: Skin color, texture, turgor normal. No rashes or lesions Neurologic: Grossly normal Psych: Anxious  EKG: Normal sinus rhythm at 88  ASSESSMENT: 1. Coronary artery disease status post three-vessel CABG in 2015 (LIMA-LAD, SVG-OM2, SVG-dRCA) 2. DM2 3. Dyslipidemia 4. Inappropriate sinus tachycardia   PLAN: 1.   Kathryn Logan is doing well without any chest complaint. She is due for repeat checks of diabetes and cholesterol which is ordered by her primary care provider at all request those records when they're available. She reports good control of her her heart rate and now is tolerating carvedilol. No changes her medicines today plan follow-up annually or sooner as necessary.  Pixie Casino, MD, [facility] Attending Cardiologist [place] Alysandra Lobue 06/09/2016, 3:30 PM

## 2016-06-13 ENCOUNTER — Other Ambulatory Visit: Payer: Self-pay | Admitting: *Deleted

## 2016-06-13 MED ORDER — LISINOPRIL 2.5 MG PO TABS: 2.5000 mg | ORAL_TABLET | Freq: Every day | ORAL | 3 refills | Status: DC

## 2016-06-27 ENCOUNTER — Ambulatory Visit: Payer: Medicare Other

## 2016-06-27 NOTE — Progress Notes (Deleted)
Subjective:   Kathryn Logan is a 71 y.o. female who presents for an Initial Medicare Annual Wellness Visit.  Review of Systems    No ROS.  Medicare Wellness Visit.     Sleep patterns: {SX; SLEEP PATTERNS:18802::"feels rested on waking","does not get up to void","gets up *** times nightly to void","sleeps *** hours nightly"}.   Home Safety/Smoke Alarms:   Living environment; residence and Firearm Safety: {Rehab home environment / accessibility:30080::"no firearms","firearms stored safely"}. Seat Belt Safety/Bike Helmet: Wears seat belt.   Counseling:   Eye Exam- Last 12/14/2014 Dental-  Female:   Pap-  none     Mammo- Last 11/12/12,        Dexa scan-  none      CCS- none       Objective:    There were no vitals filed for this visit. There is no height or weight on file to calculate BMI.   Current Medications (verified) Outpatient Encounter Prescriptions as of 06/27/2016  Medication Sig  . aspirin EC 81 MG tablet Take 81 mg by mouth daily.   [name] atorvastatin (LIPITOR) 80 MG tablet Take 1 tablet (80 mg total) by mouth daily.  [name] BIOTIN PO Take 5,000 mcg by mouth daily.   . bisacodyl (DULCOLAX) 5 MG EC tablet Take 5 mg by mouth daily as needed for moderate constipation.   . carvedilol (COREG) 25 MG tablet Take 1 tablet (25 mg total) by mouth 2 (two) times daily.  . Cholecalciferol (VITAMIN D-3) 1000 units CAPS Take 1 capsule (1,000 Units total) by mouth daily.  Mariane Baumgarten Calcium (STOOL SOFTENER PO) Take by mouth as needed.  [name] glucose blood (ONE TOUCH ULTRA TEST) test strip Check blood sugar daily as directed, DX 250.02  . lanolin-mineral oil (BABY OIL) OIL Apply 1 application topically every other day.  . lisinopril (PRINIVIL,ZESTRIL) 2.5 MG tablet Take 1 tablet (2.5 mg total) by mouth daily.  [name] loratadine (CLARITIN) 10 MG tablet Take 1 tablet (10 mg total) by mouth daily.  . metFORMIN (GLUCOPHAGE) 500 MG tablet Take one tablet by mouth once daily after largest meal  .  ONETOUCH DELICA LANCETS FINE MISC 1 each by Does not apply route as directed. Check blood sugar daily as directed, DX 250.02 HOLD UNTIL PATIENT REQUEST  . ranitidine (ZANTAC) 150 MG tablet Take 1 tablet (150 mg total) by mouth every 12 (twelve) hours.  . sertraline (ZOLOFT) 25 MG tablet Take 1 tablet (25 mg total) by mouth daily.  . vitamin B-12 (CYANOCOBALAMIN) 1000 MCG tablet Take 1,000 mcg by mouth daily.   No facility-administered encounter medications on file as of 06/27/2016.     Allergies (verified) Patient has no known allergies.   History: Past Medical History:  Diagnosis Date  . Diabetes mellitus without complication ([place])   . Gallbladder sludge   . Heart disease   . Hyperlipidemia   . Osteopenia    Past Surgical History:  Procedure Laterality Date  . CORONARY ARTERY BYPASS GRAFT N/A 12/01/2013   Procedure: CORONARY ARTERY BYPASS GRAFTING (CABG) x three, using left internal mammary artery, and right leg saphenous vein harvested endoscopically (LIMA to LAD, SVG to OM2 , SVG to dRCA);  Surgeon: Grace Isaac, MD;  Location: [facility];  Service: Open Heart Surgery;  Laterality: N/A;  . G 1 P 1    . INTRAOPERATIVE TRANSESOPHAGEAL ECHOCARDIOGRAM N/A 12/01/2013   Procedure: INTRAOPERATIVE TRANSESOPHAGEAL ECHOCARDIOGRAM;  Surgeon: Grace Isaac, MD;  Location: [facility];  Service: Open Heart  Surgery;  Laterality: N/A;  . LEFT HEART CATHETERIZATION WITH CORONARY ANGIOGRAM N/A 11/27/2013   Procedure: LEFT HEART CATHETERIZATION WITH CORONARY ANGIOGRAM;  Surgeon: Sinclair Grooms, MD;  Location: [facility] CATH LAB;  Service: Cardiovascular;  Laterality: N/A;  . no colonoscopy     SOC reviewed  . TONSILLECTOMY AND ADENOIDECTOMY     Family History  Problem Relation Age of Onset  . Heart attack Mother 2  . Diabetes Mother   . Stroke Mother   . Hypertension Mother   . Goiter Mother   . Heart attack Father     >55  . Bladder Cancer Father   . Parkinson's disease Maternal Aunt   .  Osteoporosis Sister   . Diabetes Brother    Social History   Occupational History  . Not on file.   Social History Main Topics  . Smoking status: Never Smoker  . Smokeless tobacco: Never Used  . Alcohol use Yes     Comment: Occasionally   . Drug use: No  . Sexual activity: Not on file    Tobacco Counseling Counseling given: Not Answered   Activities of Daily Living No flowsheet data found.  Immunizations and Health Maintenance Immunization History  Administered Date(s) Administered  . Influenza, High Dose Seasonal PF 02/22/2016  . Influenza,inj,Quad PF,36+ Mos 03/03/2014, 04/14/2015  . Pneumococcal Polysaccharide-23 08/29/2012  . Tdap 08/28/2012   Health Maintenance Due  Topic Date Due  . Hepatitis C Screening  [birth year]  . COLONOSCOPY  05/03/1997  . ZOSTAVAX  05/04/2007  . PNA vac Low Risk Adult (2 of 2 - PCV13) 08/29/2013  . MAMMOGRAM  11/13/2014  . HEMOGLOBIN A1C  10/11/2015  . OPHTHALMOLOGY EXAM  12/14/2015  . FOOT EXAM  04/13/2016    Patient Care Team: Binnie Rail, MD as PCP - General (Internal Medicine) Consuelo Pandy, PA-C as Physician Assistant (Cardiology) Pixie Casino, MD as Consulting Physician (Cardiology)  Indicate any recent Medical Services you may have received from other than [facility] providers in the past year (date may be approximate).     Assessment:   This is a routine wellness examination for [name]. Physical assessment deferred to PCP.   Hearing/Vision screen No exam data present  Dietary issues and exercise activities discussed:   Diet (meal preparation, eat out, water intake, caffeinated beverages, dairy products, fruits and vegetables): {Desc; diets:16563} Breakfast: Lunch:  Dinner:      Goals    None     Depression Screen PHQ 2/9 Scores 04/14/2015 04/27/2014 01/21/2014 08/28/2012  PHQ - 2 Score 0 0 0 0    Fall Risk Fall Risk  04/14/2015 08/28/2012  Falls in the past year? No No    Cognitive Function:          Screening Tests Health Maintenance  Topic Date Due  . Hepatitis C Screening  Sep 15, 1945  . COLONOSCOPY  05/03/1997  . ZOSTAVAX  05/04/2007  . PNA vac Low Risk Adult (2 of 2 - PCV13) 08/29/2013  . MAMMOGRAM  11/13/2014  . HEMOGLOBIN A1C  10/11/2015  . OPHTHALMOLOGY EXAM  12/14/2015  . FOOT EXAM  04/13/2016  . TETANUS/TDAP  08/29/2022  . INFLUENZA VACCINE  Completed  . DEXA SCAN  Completed      Plan:    Continue to eat heart healthy diet (full of fruits, vegetables, whole grains, lean protein, water--limit salt, fat, and sugar intake) and increase physical activity as tolerated.  Continue doing brain stimulating activities (puzzles, reading, adult coloring books, staying  active) to keep memory sharp.   During the course of the visit, Promyce was educated and counseled about the following appropriate screening and preventive services:   Vaccines to include Pneumoccal, Influenza, Hepatitis B, Td, Zostavax, HCV  Cardiovascular disease screening  Colorectal cancer screening  Bone density screening  Diabetes screening  Glaucoma screening  Mammography/PAP  Nutrition counseling  Patient Instructions (the written plan) were given to the patient.    Michiel Cowboy, RN   06/27/2016

## 2016-06-27 NOTE — Progress Notes (Deleted)
Pre visit review using our clinic review tool, if applicable. No additional management support is needed unless otherwise documented below in the visit note. 

## 2016-07-11 ENCOUNTER — Other Ambulatory Visit: Payer: Self-pay | Admitting: Internal Medicine

## 2016-07-11 MED ORDER — CARVEDILOL 25 MG PO TABS: 25.0000 mg | ORAL_TABLET | Freq: Two times a day (BID) | ORAL | 3 refills | Status: DC

## 2016-07-11 MED ORDER — ATORVASTATIN CALCIUM 80 MG PO TABS: 80.0000 mg | ORAL_TABLET | Freq: Every day | ORAL | 3 refills | Status: DC

## 2016-07-11 NOTE — Telephone Encounter (Signed)
°*  STAT* If patient is at the pharmacy, call can be transferred to refill team.   1. Which medications need to be refilled? (please list name of each medication and dose if known) Atorvastatin and Carvedilol 2. Which pharmacy/location (including street and city if local pharmacy) is medication to be sent to?[phone]  3. Do they need a 30 day or 90 day supply? Carvedilol 180 and Atorvastatin 90

## 2016-07-11 NOTE — Telephone Encounter (Signed)
Rx(s) sent to pharmacy electronically.  

## 2016-07-12 ENCOUNTER — Other Ambulatory Visit: Payer: Self-pay | Admitting: *Deleted

## 2016-07-12 NOTE — Telephone Encounter (Signed)
Medication was e sent on 07/11/26- 90 day supply  Atorvastatin , carvdeilol

## 2016-08-22 ENCOUNTER — Ambulatory Visit: Payer: Medicare Other | Admitting: Internal Medicine

## 2016-08-31 ENCOUNTER — Ambulatory Visit: Payer: Medicare Other

## 2016-09-07 ENCOUNTER — Ambulatory Visit: Payer: Medicare Other

## 2016-09-09 ENCOUNTER — Other Ambulatory Visit: Payer: Self-pay | Admitting: Internal Medicine

## 2016-09-14 ENCOUNTER — Other Ambulatory Visit: Payer: Self-pay | Admitting: Internal Medicine

## 2016-10-15 ENCOUNTER — Other Ambulatory Visit: Payer: Self-pay | Admitting: Internal Medicine

## 2016-10-16 ENCOUNTER — Telehealth: Payer: Self-pay | Admitting: Internal Medicine

## 2016-10-16 MED ORDER — SERTRALINE HCL 25 MG PO TABS: 25.0000 mg | ORAL_TABLET | Freq: Every day | ORAL | 0 refills | Status: DC

## 2016-10-16 NOTE — Telephone Encounter (Signed)
Pt called stating that when trying to get her Sertraline refilled she was told to contact her doctor. There was a note put in her chart stating that she needed to make a follow up appointment for further refills. She was last seen in April for a 6 month follow up. Does she need to make an appointment?

## 2016-10-16 NOTE — Telephone Encounter (Signed)
Spoke with pt, per Oct OV 6 mo follow-up is needed. appt scheduled. Refill sent to [facility]

## 2016-10-30 ENCOUNTER — Other Ambulatory Visit: Payer: Self-pay | Admitting: Internal Medicine

## 2016-11-06 ENCOUNTER — Ambulatory Visit: Payer: Medicare Other | Admitting: Internal Medicine

## 2016-11-09 ENCOUNTER — Other Ambulatory Visit: Payer: Self-pay | Admitting: Internal Medicine

## 2016-11-09 DIAGNOSIS — K219 Gastro-esophageal reflux disease without esophagitis: Secondary | ICD-10-CM

## 2016-11-12 ENCOUNTER — Other Ambulatory Visit: Payer: Self-pay | Admitting: Internal Medicine

## 2016-11-21 ENCOUNTER — Ambulatory Visit: Payer: Medicare Other | Admitting: Internal Medicine

## 2016-12-09 ENCOUNTER — Other Ambulatory Visit: Payer: Self-pay | Admitting: Internal Medicine

## 2016-12-12 ENCOUNTER — Ambulatory Visit: Payer: Medicare Other | Admitting: Internal Medicine

## 2017-01-02 ENCOUNTER — Ambulatory Visit: Payer: Medicare Other | Admitting: Internal Medicine

## 2017-01-08 ENCOUNTER — Other Ambulatory Visit: Payer: Self-pay | Admitting: Internal Medicine

## 2017-01-23 ENCOUNTER — Ambulatory Visit: Payer: Medicare Other | Admitting: Internal Medicine

## 2017-01-25 ENCOUNTER — Other Ambulatory Visit: Payer: Self-pay | Admitting: Internal Medicine

## 2017-01-26 ENCOUNTER — Other Ambulatory Visit: Payer: Self-pay | Admitting: Internal Medicine

## 2017-02-07 ENCOUNTER — Other Ambulatory Visit: Payer: Self-pay | Admitting: Internal Medicine

## 2017-02-07 DIAGNOSIS — K219 Gastro-esophageal reflux disease without esophagitis: Secondary | ICD-10-CM

## 2017-02-14 NOTE — Progress Notes (Deleted)
Subjective:    Patient ID: Kathryn Logan, female    DOB: January 24, 1946, 71 y.o.   MRN: [id number]  HPI The patient is here for follow up.  CAD : She is taking her medication daily. She is compliant with a low sodium diet.  She denies chest pain, palpitations, edema, shortness of breath and regular headaches. She is exercising regularly.    Hyperlipidemia: She is taking her medication daily. She is compliant with a low fat/cholesterol diet. She is exercising regularly. She denies myalgias.     Diabetes: She is taking her medication daily as prescribed. She is compliant with a diabetic diet. She is exercising regularly. She monitors her sugars and they have been running XXX. She checks her feet daily and denies foot lesions. She is up-to-date with an ophthalmology examination.   Anxiety: She is taking her medication daily as prescribed. She denies any side effects from the medication. She feels her anxiety is well controlled and she is happy with her current dose of medication.   GERD:  She is taking her medication daily as prescribed.  She denies any GERD symptoms and feels her GERD is well controlled.    Medications and allergies reviewed with patient and updated if appropriate.  Patient Active Problem List   Diagnosis Date Noted  . Vitamin D deficiency 02/22/2016  . Anxiety 02/22/2016  . CAD (coronary artery disease) 02/21/2016  . GERD (gastroesophageal reflux disease) 04/14/2015  . Inappropriate sinus tachycardia 03/16/2015  . S/P CABG x 3 12/01/2013  . NSTEMI (non-ST elevated myocardial infarction) ([place]) 11/26/2013  . Type 2 diabetes mellitus with vascular disease ([place]) 08/28/2012  . Hyperlipidemia 12/18/2006  . Osteopenia 10/09/2006    Current Outpatient Prescriptions on File Prior to Visit  Medication Sig Dispense Refill  . aspirin EC 81 MG tablet Take 81 mg by mouth daily.     [name] atorvastatin (LIPITOR) 80 MG tablet Take 1 tablet (80 mg total) by mouth daily. 90 tablet  3  . BIOTIN PO Take 5,000 mcg by mouth daily.     . bisacodyl (DULCOLAX) 5 MG EC tablet Take 5 mg by mouth daily as needed for moderate constipation.     . carvedilol (COREG) 25 MG tablet Take 1 tablet (25 mg total) by mouth 2 (two) times daily. 180 tablet 3  . Cholecalciferol (VITAMIN D-3) 1000 units CAPS Take 1 capsule (1,000 Units total) by mouth daily. 60 capsule   . Docusate Calcium (STOOL SOFTENER PO) Take by mouth as needed.    [name] glucose blood (ONE TOUCH ULTRA TEST) test strip Check blood sugar daily as directed, DX 250.02 100 each 12  . lanolin-mineral oil (BABY OIL) OIL Apply 1 application topically every other day.    . lisinopril (PRINIVIL,ZESTRIL) 2.5 MG tablet Take 1 tablet (2.5 mg total) by mouth daily. 90 tablet 3  . loratadine (CLARITIN) 10 MG tablet Take 1 tablet (10 mg total) by mouth daily. 90 tablet 3  . metFORMIN (GLUCOPHAGE) 500 MG tablet TAKE 1 TABLET BY MOUTH ONCE DAILY AFTER LARGEST MEAL 30 tablet 0  . ONETOUCH DELICA LANCETS FINE MISC 1 each by Does not apply route as directed. Check blood sugar daily as directed, DX 250.02 HOLD UNTIL PATIENT REQUEST 100 each 5  . ranitidine (ZANTAC) 150 MG tablet Take 1 tablet (150 mg total) by mouth every 12 (twelve) hours. 60 tablet 0  . sertraline (ZOLOFT) 25 MG tablet TAKE 1 TABLET BY MOUTH EVERY DAY 30 tablet 0  .  vitamin B-12 (CYANOCOBALAMIN) 1000 MCG tablet Take 1,000 mcg by mouth daily.     No current facility-administered medications on file prior to visit.     Past Medical History:  Diagnosis Date  . Diabetes mellitus without complication ([place])   . Gallbladder sludge   . Heart disease   . Hyperlipidemia   . Osteopenia     Past Surgical History:  Procedure Laterality Date  . CORONARY ARTERY BYPASS GRAFT N/A 12/01/2013   Procedure: CORONARY ARTERY BYPASS GRAFTING (CABG) x three, using left internal mammary artery, and right leg saphenous vein harvested endoscopically (LIMA to LAD, SVG to OM2 , SVG to dRCA);  Surgeon:  Grace Isaac, MD;  Location: [facility];  Service: Open Heart Surgery;  Laterality: N/A;  . G 1 P 1    . INTRAOPERATIVE TRANSESOPHAGEAL ECHOCARDIOGRAM N/A 12/01/2013   Procedure: INTRAOPERATIVE TRANSESOPHAGEAL ECHOCARDIOGRAM;  Surgeon: Grace Isaac, MD;  Location: [facility];  Service: Open Heart Surgery;  Laterality: N/A;  . LEFT HEART CATHETERIZATION WITH CORONARY ANGIOGRAM N/A 11/27/2013   Procedure: LEFT HEART CATHETERIZATION WITH CORONARY ANGIOGRAM;  Surgeon: Sinclair Grooms, MD;  Location: [facility] CATH LAB;  Service: Cardiovascular;  Laterality: N/A;  . no colonoscopy     SOC reviewed  . TONSILLECTOMY AND ADENOIDECTOMY      Social History   Social History  . Marital status: Married    Spouse name: N/A  . Number of children: N/A  . Years of education: N/A   Social History Main Topics  . Smoking status: Never Smoker  . Smokeless tobacco: Never Used  . Alcohol use Yes     Comment: Occasionally   . Drug use: No  . Sexual activity: Not on file   Other Topics Concern  . Not on file   Social History Narrative  . No narrative on file    Family History  Problem Relation Age of Onset  . Heart attack Mother 50  . Diabetes Mother   . Stroke Mother   . Hypertension Mother   . Goiter Mother   . Heart attack Father        >55  . Bladder Cancer Father   . Parkinson's disease Maternal Aunt   . Osteoporosis Sister   . Diabetes Brother     Review of Systems     Objective:  There were no vitals filed for this visit. Wt Readings from Last 3 Encounters:  06/09/16 139 lb 9.6 oz (63.3 kg)  02/22/16 142 lb (64.4 kg)  04/14/15 146 lb 12.8 oz (66.6 kg)   There is no height or weight on file to calculate BMI.   Physical Exam    Constitutional: Appears well-developed and well-nourished. No distress.  HENT:  Head: Normocephalic and atraumatic.  Neck: Neck supple. No tracheal deviation present. No thyromegaly present.  No cervical lymphadenopathy Cardiovascular: Normal rate,  regular rhythm and normal heart sounds.   No murmur heard. No carotid bruit .  No edema Pulmonary/Chest: Effort normal and breath sounds normal. No respiratory distress. No has no wheezes. No rales.  Skin: Skin is warm and dry. Not diaphoretic.  Psychiatric: Normal mood and affect. Behavior is normal.      Assessment & Plan:    See Problem List for Assessment and Plan of chronic medical problems.

## 2017-02-15 ENCOUNTER — Ambulatory Visit: Payer: Medicare Other | Admitting: Internal Medicine

## 2017-02-20 ENCOUNTER — Encounter: Payer: Self-pay | Admitting: Emergency Medicine

## 2017-03-04 ENCOUNTER — Other Ambulatory Visit: Payer: Self-pay | Admitting: Internal Medicine

## 2017-03-12 ENCOUNTER — Other Ambulatory Visit: Payer: Self-pay | Admitting: Internal Medicine

## 2017-03-12 DIAGNOSIS — K219 Gastro-esophageal reflux disease without esophagitis: Secondary | ICD-10-CM

## 2017-03-14 DIAGNOSIS — Z23 Encounter for immunization: Secondary | ICD-10-CM | POA: Diagnosis not present

## 2017-03-20 ENCOUNTER — Other Ambulatory Visit: Payer: Self-pay | Admitting: Emergency Medicine

## 2017-03-20 DIAGNOSIS — K219 Gastro-esophageal reflux disease without esophagitis: Secondary | ICD-10-CM

## 2017-03-23 ENCOUNTER — Other Ambulatory Visit: Payer: Self-pay | Admitting: Internal Medicine

## 2017-04-03 ENCOUNTER — Other Ambulatory Visit: Payer: Self-pay | Admitting: *Deleted

## 2017-04-03 MED ORDER — CARVEDILOL 25 MG PO TABS: 25.0000 mg | ORAL_TABLET | Freq: Two times a day (BID) | ORAL | 3 refills | Status: DC

## 2017-04-12 NOTE — Progress Notes (Signed)
Subjective:    Patient ID: Kathryn Logan, female    DOB: 1945-10-22, 71 y.o.   MRN: [id number]  HPI The patient is here for follow up.  Diabetes: She is taking her medication daily as prescribed. She is compliant with a diabetic diet. She is exercising regularly - 30 minutes a day. She monitors her sugars and they have been running 120's. She checks her feet daily and denies foot lesions. She is up-to-date with an ophthalmology examination and has an appointment coming up.  She will have her report sent.   CAD:  She sees cardiology annually. She denies chest pain, palpitations, leg edema and shortness of breath.  She is very active.  She is taking all of her medications as prescribed.  Hyperlipidemia: She is taking her medication daily. She is compliant with a low fat/cholesterol diet. She is exercising regularly. She denies myalgias.   Anxiety: She is taking her medication daily as prescribed. She denies any side effects from the medication. She feels her anxiety is well controlled and she is happy with her current dose of medication.   GERD:  She is taking her medication daily as prescribed.  She denies any GERD symptoms and feels her GERD is well controlled.    Medications and allergies reviewed with patient and updated if appropriate.  Patient Active Problem List   Diagnosis Date Noted  . Vitamin D deficiency 02/22/2016  . Anxiety 02/22/2016  . CAD (coronary artery disease) 02/21/2016  . GERD (gastroesophageal reflux disease) 04/14/2015  . Inappropriate sinus tachycardia 03/16/2015  . S/P CABG x 3 12/01/2013  . NSTEMI (non-ST elevated myocardial infarction) ([place]) 11/26/2013  . Type 2 diabetes mellitus with vascular disease ([place]) 08/28/2012  . Hyperlipidemia 12/18/2006  . Osteopenia 10/09/2006    Current Outpatient Medications on File Prior to Visit  Medication Sig Dispense Refill  . aspirin EC 81 MG tablet Take 81 mg by mouth daily.     [name] atorvastatin (LIPITOR) 80 MG  tablet Take 1 tablet (80 mg total) by mouth daily. 90 tablet 3  . BIOTIN PO Take 5,000 mcg by mouth daily.     . bisacodyl (DULCOLAX) 5 MG EC tablet Take 5 mg by mouth daily as needed for moderate constipation.     . carvedilol (COREG) 25 MG tablet Take 1 tablet (25 mg total) by mouth 2 (two) times daily. 180 tablet 3  . Cholecalciferol (VITAMIN D-3) 1000 units CAPS Take 1 capsule (1,000 Units total) by mouth daily. (Patient taking differently: Take 2 capsules by mouth daily. ) 60 capsule   . Docusate Calcium (STOOL SOFTENER PO) Take by mouth as needed.    [name] glucose blood (ONE TOUCH ULTRA TEST) test strip Check blood sugar daily as directed, DX 250.02 100 each 12  . lanolin-mineral oil (BABY OIL) OIL Apply 1 application topically every other day.    . lisinopril (PRINIVIL,ZESTRIL) 2.5 MG tablet Take 1 tablet (2.5 mg total) by mouth daily. 90 tablet 3  . loratadine (CLARITIN) 10 MG tablet Take 1 tablet (10 mg total) by mouth daily. 90 tablet 3  . ONETOUCH DELICA LANCETS FINE MISC 1 each by Does not apply route as directed. Check blood sugar daily as directed, DX 250.02 HOLD UNTIL PATIENT REQUEST 100 each 5  . vitamin B-12 (CYANOCOBALAMIN) 1000 MCG tablet Take 1,000 mcg by mouth daily.     No current facility-administered medications on file prior to visit.     Past Medical History:  Diagnosis Date  .  Diabetes mellitus without complication ([place])   . Gallbladder sludge   . Heart disease   . Hyperlipidemia   . Osteopenia     Past Surgical History:  Procedure Laterality Date  . CORONARY ARTERY BYPASS GRAFT N/A 12/01/2013   Procedure: CORONARY ARTERY BYPASS GRAFTING (CABG) x three, using left internal mammary artery, and right leg saphenous vein harvested endoscopically (LIMA to LAD, SVG to OM2 , SVG to dRCA);  Surgeon: Grace Isaac, MD;  Location: [place];  Service: Open Heart Surgery;  Laterality: N/A;  . G 1 P 1    . INTRAOPERATIVE TRANSESOPHAGEAL ECHOCARDIOGRAM N/A 12/01/2013    Procedure: INTRAOPERATIVE TRANSESOPHAGEAL ECHOCARDIOGRAM;  Surgeon: Grace Isaac, MD;  Location: [place];  Service: Open Heart Surgery;  Laterality: N/A;  . LEFT HEART CATHETERIZATION WITH CORONARY ANGIOGRAM N/A 11/27/2013   Procedure: LEFT HEART CATHETERIZATION WITH CORONARY ANGIOGRAM;  Surgeon: Sinclair Grooms, MD;  Location: [facility] CATH LAB;  Service: Cardiovascular;  Laterality: N/A;  . no colonoscopy     SOC reviewed  . TONSILLECTOMY AND ADENOIDECTOMY      Social History   Socioeconomic History  . Marital status: Married    Spouse name: None  . Number of children: None  . Years of education: None  . Highest education level: None  Social Needs  . Financial resource strain: None  . Food insecurity - worry: None  . Food insecurity - inability: None  . Transportation needs - medical: None  . Transportation needs - non-medical: None  Occupational History  . None  Tobacco Use  . Smoking status: Never Smoker  . Smokeless tobacco: Never Used  Substance and Sexual Activity  . Alcohol use: Yes    Comment: Occasionally   . Drug use: No  . Sexual activity: None  Other Topics Concern  . None  Social History Narrative  . None    Family History  Problem Relation Age of Onset  . Heart attack Mother 57  . Diabetes Mother   . Stroke Mother   . Hypertension Mother   . Goiter Mother   . Heart attack Father        >55  . Bladder Cancer Father   . Parkinson's disease Maternal Aunt   . Osteoporosis Sister   . Diabetes Brother     Review of Systems  Constitutional: Negative for chills and fever.  Respiratory: Negative for cough, shortness of breath and wheezing.   Cardiovascular: Negative for chest pain, palpitations and leg swelling.  Neurological: Negative for light-headedness, numbness and headaches.       Objective:   Vitals:   04/13/17 1123  BP: 110/80  Pulse: 91  Resp: 16  Temp: 98.1 F (36.7 C)  SpO2: 98%   Wt Readings from Last 3 Encounters:  04/13/17 133  lb (60.3 kg)  06/09/16 139 lb 9.6 oz (63.3 kg)  02/22/16 142 lb (64.4 kg)   Body mass index is 24.33 kg/m.   Physical Exam    Constitutional: Appears well-developed and well-nourished. No distress.  HENT:  Head: Normocephalic and atraumatic.  Neck: Neck supple. No tracheal deviation present. No thyromegaly present.  No cervical lymphadenopathy Cardiovascular: Normal rate, regular rhythm and normal heart sounds.   No murmur heard. No carotid bruit .  No edema Pulmonary/Chest: Effort normal and breath sounds normal. No respiratory distress. No has no wheezes. No rales.  Skin: Skin is warm and dry. Not diaphoretic.  Psychiatric: Normal mood and affect. Behavior is normal.   Diabetic  Foot Exam - Simple   Simple Foot Form Diabetic Foot exam was performed with the following findings:  Yes   Visual Inspection No deformities, no ulcerations, no other skin breakdown bilaterally:  Yes Sensation Testing Intact to touch and monofilament testing bilaterally:  Yes Pulse Check Posterior Tibialis and Dorsalis pulse intact bilaterally:  Yes Comments       Assessment & Plan:    See Problem List for Assessment and Plan of chronic medical problems.

## 2017-04-12 NOTE — Patient Instructions (Addendum)
All other Health Maintenance issues reviewed.   All recommended immunizations and age-appropriate screenings are up-to-date or discussed.  Prevnar immunization administered today.   Medications reviewed and updated.   No changes recommended at this time.  Your prescription(s) have been submitted to your pharmacy. Please take as directed and contact our office if you believe you are having problem(s) with the medication(s).   Please followup in 6 months

## 2017-04-13 ENCOUNTER — Encounter: Payer: Self-pay | Admitting: Internal Medicine

## 2017-04-13 ENCOUNTER — Other Ambulatory Visit (INDEPENDENT_AMBULATORY_CARE_PROVIDER_SITE_OTHER): Payer: Medicare Other

## 2017-04-13 ENCOUNTER — Ambulatory Visit (INDEPENDENT_AMBULATORY_CARE_PROVIDER_SITE_OTHER): Payer: Medicare Other | Admitting: Internal Medicine

## 2017-04-13 VITALS — BP 110/80 | HR 91 | Temp 98.1°F | Resp 16 | Wt 133.0 lb

## 2017-04-13 DIAGNOSIS — E1159 Type 2 diabetes mellitus with other circulatory complications: Secondary | ICD-10-CM | POA: Diagnosis not present

## 2017-04-13 DIAGNOSIS — I25119 Atherosclerotic heart disease of native coronary artery with unspecified angina pectoris: Secondary | ICD-10-CM | POA: Diagnosis not present

## 2017-04-13 DIAGNOSIS — F419 Anxiety disorder, unspecified: Secondary | ICD-10-CM

## 2017-04-13 DIAGNOSIS — E7849 Other hyperlipidemia: Secondary | ICD-10-CM

## 2017-04-13 DIAGNOSIS — K219 Gastro-esophageal reflux disease without esophagitis: Secondary | ICD-10-CM | POA: Diagnosis not present

## 2017-04-13 DIAGNOSIS — Z23 Encounter for immunization: Secondary | ICD-10-CM | POA: Diagnosis not present

## 2017-04-13 LAB — COMPREHENSIVE METABOLIC PANEL
ALT: 14 U/L (ref 0–35)
AST: 14 U/L (ref 0–37)
Albumin: 4 g/dL (ref 3.5–5.2)
Alkaline Phosphatase: 89 U/L (ref 39–117)
BILIRUBIN TOTAL: 0.8 mg/dL (ref 0.2–1.2)
BUN: 28 mg/dL — AB (ref 6–23)
CALCIUM: 9.5 mg/dL (ref 8.4–10.5)
CO2: 24 mEq/L (ref 19–32)
CREATININE: 0.89 mg/dL (ref 0.40–1.20)
Chloride: 100 mEq/L (ref 96–112)
GFR: 66.66 mL/min (ref >60.00)
GLUCOSE: 374 mg/dL — AB (ref 70–99)
Potassium: 4.3 mEq/L (ref 3.5–5.1)
SODIUM: 135 meq/L (ref 135–145)
Total Protein: 7 g/dL (ref 6.0–8.3)

## 2017-04-13 LAB — LIPID PANEL
Cholesterol: 151 mg/dL (ref 0–200)
HDL: 28.6 mg/dL — ABNORMAL LOW (ref >39.00)
NONHDL: 121.93
TRIGLYCERIDES: 211 mg/dL — AB (ref 0.0–149.0)
Total CHOL/HDL Ratio: 5
VLDL: 42.2 mg/dL — ABNORMAL HIGH (ref 0.0–40.0)

## 2017-04-13 LAB — HEMOGLOBIN A1C: HEMOGLOBIN A1C: 13.1 % — AB (ref 4.6–6.5)

## 2017-04-13 LAB — LDL CHOLESTEROL, DIRECT: Direct LDL: 100 mg/dL

## 2017-04-13 MED ORDER — METFORMIN HCL 500 MG PO TABS: ORAL_TABLET | ORAL | 5 refills | Status: DC

## 2017-04-13 MED ORDER — RANITIDINE HCL 150 MG PO TABS: 150.0000 mg | ORAL_TABLET | Freq: Two times a day (BID) | ORAL | 5 refills | Status: DC

## 2017-04-13 MED ORDER — SERTRALINE HCL 25 MG PO TABS: 25.0000 mg | ORAL_TABLET | Freq: Every day | ORAL | 5 refills | Status: DC

## 2017-04-13 NOTE — Assessment & Plan Note (Signed)
No chest pain, palpitations or shortness of breath Continue current medications Continue regular exercise Follow him with cardiology annually

## 2017-04-13 NOTE — Assessment & Plan Note (Signed)
Controlled, stable Continue current dose of medication  

## 2017-04-13 NOTE — Assessment & Plan Note (Signed)
Check lipid panel  Continue daily statin Regular exercise and healthy diet encouraged  

## 2017-04-13 NOTE — Assessment & Plan Note (Signed)
GERD controlled Continue daily medication  

## 2017-04-13 NOTE — Assessment & Plan Note (Signed)
Check A1c Has a eye exam scheduled-we will have report sent Stressed the importance of regular exercise Ideally she would like to come off metformin-we will see what her A1c is Continue diabetic diet Follow-up in 6 months

## 2017-04-17 ENCOUNTER — Other Ambulatory Visit: Payer: Self-pay | Admitting: Internal Medicine

## 2017-04-17 DIAGNOSIS — E7849 Other hyperlipidemia: Secondary | ICD-10-CM

## 2017-04-17 DIAGNOSIS — E1159 Type 2 diabetes mellitus with other circulatory complications: Secondary | ICD-10-CM

## 2017-04-17 MED ORDER — METFORMIN HCL 1000 MG PO TABS: 1000.0000 mg | ORAL_TABLET | Freq: Two times a day (BID) | ORAL | 3 refills | Status: DC

## 2017-04-17 MED ORDER — ROSUVASTATIN CALCIUM 40 MG PO TABS: 40.0000 mg | ORAL_TABLET | Freq: Every day | ORAL | 3 refills | Status: DC

## 2017-05-18 ENCOUNTER — Ambulatory Visit (INDEPENDENT_AMBULATORY_CARE_PROVIDER_SITE_OTHER): Payer: Medicare Other | Admitting: Internal Medicine

## 2017-05-18 ENCOUNTER — Encounter: Payer: Self-pay | Admitting: Internal Medicine

## 2017-05-18 VITALS — BP 106/60 | HR 104 | Temp 98.2°F | Resp 16 | Wt 135.0 lb

## 2017-05-18 DIAGNOSIS — J069 Acute upper respiratory infection, unspecified: Secondary | ICD-10-CM

## 2017-05-18 DIAGNOSIS — E559 Vitamin D deficiency, unspecified: Secondary | ICD-10-CM

## 2017-05-18 MED ORDER — HYDROCODONE-HOMATROPINE 5-1.5 MG/5ML PO SYRP: 5.0000 mL | ORAL_SOLUTION | Freq: Three times a day (TID) | ORAL | 0 refills | Status: DC | PRN

## 2017-05-18 MED ORDER — AMOXICILLIN-POT CLAVULANATE 875-125 MG PO TABS: 1.0000 | ORAL_TABLET | Freq: Two times a day (BID) | ORAL | 0 refills | Status: DC

## 2017-05-18 NOTE — Patient Instructions (Signed)
Use the cough syrup as prescribed.  Continue mucinex.   Take the antibiotic if needed if your symptoms do not improve over the next few days.   Increase rest and fluids.    Call if no improvement     Upper Respiratory Infection, Adult Most upper respiratory infections (URIs) are a viral infection of the air passages leading to the lungs. A URI affects the nose, throat, and upper air passages. The most common type of URI is nasopharyngitis and is typically referred to as "the common cold." URIs run their course and usually go away on their own. Most of the time, a URI does not require medical attention, but sometimes a bacterial infection in the upper airways can follow a viral infection. This is called a secondary infection. Sinus and middle ear infections are common types of secondary upper respiratory infections. Bacterial pneumonia can also complicate a URI. A URI can worsen asthma and chronic obstructive pulmonary disease (COPD). Sometimes, these complications can require emergency medical care and may be life threatening. What are the causes? Almost all URIs are caused by viruses. A virus is a type of germ and can spread from one person to another. What increases the risk? You may be at risk for a URI if:  You smoke.  You have chronic heart or lung disease.  You have a weakened defense (immune) system.  You are very young or very old.  You have nasal allergies or asthma.  You work in crowded or poorly ventilated areas.  You work in health care facilities or schools.  What are the signs or symptoms? Symptoms typically develop 2-3 days after you come in contact with a cold virus. Most viral URIs last 7-10 days. However, viral URIs from the influenza virus (flu virus) can last 14-18 days and are typically more severe. Symptoms may include:  Runny or stuffy (congested) nose.  Sneezing.  Cough.  Sore throat.  Headache.  Fatigue.  Fever.  Loss of appetite.  Pain  in your forehead, behind your eyes, and over your cheekbones (sinus pain).  Muscle aches.  How is this diagnosed? Your health care provider may diagnose a URI by:  Physical exam.  Tests to check that your symptoms are not due to another condition such as: ? Strep throat. ? Sinusitis. ? Pneumonia. ? Asthma.  How is this treated? A URI goes away on its own with time. It cannot be cured with medicines, but medicines may be prescribed or recommended to relieve symptoms. Medicines may help:  Reduce your fever.  Reduce your cough.  Relieve nasal congestion.  Follow these instructions at home:  Take medicines only as directed by your health care provider.  Gargle warm saltwater or take cough drops to comfort your throat as directed by your health care provider.  Use a warm mist humidifier or inhale steam from a shower to increase air moisture. This may make it easier to breathe.  Drink enough fluid to keep your urine clear or pale yellow.  Eat soups and other clear broths and maintain good nutrition.  Rest as needed.  Return to work when your temperature has returned to normal or as your health care provider advises. You may need to stay home longer to avoid infecting others. You can also use a face mask and careful hand washing to prevent spread of the virus.  Increase the usage of your inhaler if you have asthma.  Do not use any tobacco products, including cigarettes, chewing tobacco, or electronic  cigarettes. If you need help quitting, ask your health care provider. How is this prevented? The best way to protect yourself from getting a cold is to practice good hygiene.  Avoid oral or hand contact with people with cold symptoms.  Wash your hands often if contact occurs.  There is no clear evidence that vitamin C, vitamin E, echinacea, or exercise reduces the chance of developing a cold. However, it is always recommended to get plenty of rest, exercise, and practice good  nutrition. Contact a health care provider if:  You are getting worse rather than better.  Your symptoms are not controlled by medicine.  You have chills.  You have worsening shortness of breath.  You have brown or red mucus.  You have yellow or brown nasal discharge.  You have pain in your face, especially when you bend forward.  You have a fever.  You have swollen neck glands.  You have pain while swallowing.  You have white areas in the back of your throat. Get help right away if:  You have severe or persistent: ? Headache. ? Ear pain. ? Sinus pain. ? Chest pain.  You have chronic lung disease and any of the following: ? Wheezing. ? Prolonged cough. ? Coughing up blood. ? A change in your usual mucus.  You have a stiff neck.  You have changes in your: ? Vision. ? Hearing. ? Thinking. ? Mood. This information is not intended to replace advice given to you by your health care provider. Make sure you discuss any questions you have with your health care provider. Document Released: 10/25/2000 Document Revised: 01/02/2016 Document Reviewed: 08/06/2013 Elsevier Interactive Patient Education  [employer].

## 2017-05-18 NOTE — Assessment & Plan Note (Signed)
Likely viral Continue mucinex, tylenol  Start hycodan prn  augmentin rx given - to take only if not feeling better in a few days Rest, fluids  Call if no improvement

## 2017-05-18 NOTE — Progress Notes (Signed)
Subjective:    Patient ID: Kathryn Logan, female    DOB: 1945/07/05, 72 y.o.   MRN: [id number]  HPI She is here for an acute visit for cold symptoms.  Her symptoms started one week ago.  She is experiencing sore throat,   She has taken mucinex without much improvement.   Medications and allergies reviewed with patient and updated if appropriate.  Patient Active Problem List   Diagnosis Date Noted  . Vitamin D deficiency 02/22/2016  . Anxiety 02/22/2016  . CAD (coronary artery disease) 02/21/2016  . GERD (gastroesophageal reflux disease) 04/14/2015  . Inappropriate sinus tachycardia 03/16/2015  . S/P CABG x 3 12/01/2013  . NSTEMI (non-ST elevated myocardial infarction) ([place]) 11/26/2013  . Type 2 diabetes mellitus with vascular disease ([place]) 08/28/2012  . Hyperlipidemia 12/18/2006  . Osteopenia 10/09/2006    Current Outpatient Medications on File Prior to Visit  Medication Sig Dispense Refill  . aspirin EC 81 MG tablet Take 81 mg by mouth daily.     [name] BIOTIN PO Take 5,000 mcg by mouth daily.     . bisacodyl (DULCOLAX) 5 MG EC tablet Take 5 mg by mouth daily as needed for moderate constipation.     . carvedilol (COREG) 25 MG tablet Take 1 tablet (25 mg total) by mouth 2 (two) times daily. 180 tablet 3  . Cholecalciferol (VITAMIN D-3) 1000 units CAPS Take 1 capsule (1,000 Units total) by mouth daily. (Patient taking differently: Take 2 capsules by mouth daily. ) 60 capsule   . Docusate Calcium (STOOL SOFTENER PO) Take by mouth as needed.    [name] glucose blood (ONE TOUCH ULTRA TEST) test strip Check blood sugar daily as directed, DX 250.02 100 each 12  . lanolin-mineral oil (BABY OIL) OIL Apply 1 application topically every other day.    . lisinopril (PRINIVIL,ZESTRIL) 2.5 MG tablet Take 1 tablet (2.5 mg total) by mouth daily. 90 tablet 3  . loratadine (CLARITIN) 10 MG tablet Take 1 tablet (10 mg total) by mouth daily. 90 tablet 3  . metFORMIN (GLUCOPHAGE) 1000 MG tablet Take  1 tablet (1,000 mg total) by mouth 2 (two) times daily with a meal. 180 tablet 3  . ONETOUCH DELICA LANCETS FINE MISC 1 each by Does not apply route as directed. Check blood sugar daily as directed, DX 250.02 HOLD UNTIL PATIENT REQUEST 100 each 5  . ranitidine (ZANTAC) 150 MG tablet Take 1 tablet (150 mg total) by mouth every 12 (twelve) hours. 60 tablet 5  . rosuvastatin (CRESTOR) 40 MG tablet Take 1 tablet (40 mg total) by mouth daily. 90 tablet 3  . sertraline (ZOLOFT) 25 MG tablet Take 1 tablet (25 mg total) by mouth daily. 30 tablet 5  . vitamin B-12 (CYANOCOBALAMIN) 1000 MCG tablet Take 1,000 mcg by mouth daily.     No current facility-administered medications on file prior to visit.     Past Medical History:  Diagnosis Date  . Diabetes mellitus without complication ([place])   . Gallbladder sludge   . Heart disease   . Hyperlipidemia   . Osteopenia     Past Surgical History:  Procedure Laterality Date  . CORONARY ARTERY BYPASS GRAFT N/A 12/01/2013   Procedure: CORONARY ARTERY BYPASS GRAFTING (CABG) x three, using left internal mammary artery, and right leg saphenous vein harvested endoscopically (LIMA to LAD, SVG to OM2 , SVG to dRCA);  Surgeon: Grace Isaac, MD;  Location: [facility];  Service: Open Heart Surgery;  Laterality: N/A;  .  G 1 P 1    . INTRAOPERATIVE TRANSESOPHAGEAL ECHOCARDIOGRAM N/A 12/01/2013   Procedure: INTRAOPERATIVE TRANSESOPHAGEAL ECHOCARDIOGRAM;  Surgeon: Grace Isaac, MD;  Location: [facility];  Service: Open Heart Surgery;  Laterality: N/A;  . LEFT HEART CATHETERIZATION WITH CORONARY ANGIOGRAM N/A 11/27/2013   Procedure: LEFT HEART CATHETERIZATION WITH CORONARY ANGIOGRAM;  Surgeon: Sinclair Grooms, MD;  Location: [facility] CATH LAB;  Service: Cardiovascular;  Laterality: N/A;  . no colonoscopy     SOC reviewed  . TONSILLECTOMY AND ADENOIDECTOMY      Social History   Socioeconomic History  . Marital status: Married    Spouse name: None  . Number of children:  None  . Years of education: None  . Highest education level: None  Social Needs  . Financial resource strain: None  . Food insecurity - worry: None  . Food insecurity - inability: None  . Transportation needs - medical: None  . Transportation needs - non-medical: None  Occupational History  . None  Tobacco Use  . Smoking status: Never Smoker  . Smokeless tobacco: Never Used  Substance and Sexual Activity  . Alcohol use: Yes    Comment: Occasionally   . Drug use: No  . Sexual activity: None  Other Topics Concern  . None  Social History Narrative  . None    Family History  Problem Relation Age of Onset  . Heart attack Mother 49  . Diabetes Mother   . Stroke Mother   . Hypertension Mother   . Goiter Mother   . Heart attack Father        >55  . Bladder Cancer Father   . Parkinson's disease Maternal Aunt   . Osteoporosis Sister   . Diabetes Brother     Review of Systems  Constitutional: Positive for chills and fatigue. Negative for fever.  HENT: Positive for sore throat. Negative for congestion, ear pain, sinus pressure and sinus pain.   Respiratory: Positive for cough (dry) and chest tightness. Negative for shortness of breath and wheezing.   Cardiovascular: Negative for chest pain.  Gastrointestinal:       Loose stool yesterday, mild nausea this morning after taking medications (usually eats)  Musculoskeletal: Positive for myalgias.  Neurological: Negative for light-headedness and headaches.       Objective:   Vitals:   05/18/17 0935  BP: 106/60  Pulse: (!) 104  Resp: 16  Temp: 98.2 F (36.8 C)  SpO2: 97%   Filed Weights   05/18/17 0935  Weight: 135 lb (61.2 kg)   Body mass index is 24.69 kg/m.  Wt Readings from Last 3 Encounters:  05/18/17 135 lb (61.2 kg)  04/13/17 133 lb (60.3 kg)  06/09/16 139 lb 9.6 oz (63.3 kg)     Physical Exam GENERAL APPEARANCE: Appears stated age, well appearing, NAD EYES: conjunctiva clear, no icterus HEENT:  bilateral tympanic membranes and ear canals normal, oropharynx with mild erythema, no thyromegaly, trachea midline, no cervical or supraclavicular lymphadenopathy LUNGS: Clear to auscultation without wheeze or crackles, unlabored breathing, good air entry bilaterally CARDIOVASCULAR: Normal S1,S2 without murmurs, no edema SKIN: warm, dry        Assessment & Plan:   See Problem List for Assessment and Plan of chronic medical problems.

## 2017-05-31 ENCOUNTER — Other Ambulatory Visit: Payer: Self-pay | Admitting: Internal Medicine

## 2017-06-28 ENCOUNTER — Other Ambulatory Visit: Payer: Self-pay | Admitting: *Deleted

## 2017-06-28 ENCOUNTER — Other Ambulatory Visit: Payer: Self-pay | Admitting: Internal Medicine

## 2017-06-28 MED ORDER — LISINOPRIL 2.5 MG PO TABS: 2.5000 mg | ORAL_TABLET | Freq: Every day | ORAL | 0 refills | Status: DC

## 2017-06-28 NOTE — Telephone Encounter (Signed)
REFILL 

## 2017-07-17 ENCOUNTER — Ambulatory Visit: Payer: Medicare Other | Admitting: Internal Medicine

## 2017-09-11 ENCOUNTER — Ambulatory Visit: Payer: Medicare Other | Admitting: Internal Medicine

## 2017-10-06 ENCOUNTER — Other Ambulatory Visit: Payer: Self-pay | Admitting: Internal Medicine

## 2017-10-06 DIAGNOSIS — K219 Gastro-esophageal reflux disease without esophagitis: Secondary | ICD-10-CM

## 2017-10-10 ENCOUNTER — Ambulatory Visit: Payer: Medicare Other | Admitting: Internal Medicine

## 2017-11-08 ENCOUNTER — Ambulatory Visit: Payer: Medicare Other | Admitting: Internal Medicine

## 2017-11-12 ENCOUNTER — Other Ambulatory Visit: Payer: Self-pay | Admitting: Internal Medicine

## 2017-11-12 DIAGNOSIS — K219 Gastro-esophageal reflux disease without esophagitis: Secondary | ICD-10-CM

## 2017-11-12 DIAGNOSIS — R55 Syncope and collapse: Secondary | ICD-10-CM

## 2017-11-12 HISTORY — DX: Syncope and collapse: R55

## 2017-11-16 ENCOUNTER — Telehealth: Payer: Self-pay | Admitting: Internal Medicine

## 2017-11-16 NOTE — Telephone Encounter (Signed)
Spoke with Kathryn Logan regarding AWV. Patient declined to schedule at this time.

## 2017-11-20 ENCOUNTER — Observation Stay (HOSPITAL_COMMUNITY): Payer: Medicare Other

## 2017-11-20 ENCOUNTER — Inpatient Hospital Stay (HOSPITAL_COMMUNITY)
Admission: EM | Admit: 2017-11-20 | Discharge: 2017-11-23 | DRG: 312 | Disposition: A | Discharge status: Home / Self Care | Payer: Medicare Other | Attending: Family Medicine | Admitting: Family Medicine

## 2017-11-20 ENCOUNTER — Emergency Department (HOSPITAL_COMMUNITY): Payer: Medicare Other

## 2017-11-20 ENCOUNTER — Other Ambulatory Visit: Payer: Self-pay

## 2017-11-20 ENCOUNTER — Encounter (HOSPITAL_COMMUNITY): Payer: Self-pay

## 2017-11-20 DIAGNOSIS — F419 Anxiety disorder, unspecified: Secondary | ICD-10-CM | POA: Diagnosis present

## 2017-11-20 DIAGNOSIS — Z7982 Long term (current) use of aspirin: Secondary | ICD-10-CM

## 2017-11-20 DIAGNOSIS — M8580 Other specified disorders of bone density and structure, unspecified site: Secondary | ICD-10-CM | POA: Diagnosis not present

## 2017-11-20 DIAGNOSIS — Z8249 Family history of ischemic heart disease and other diseases of the circulatory system: Secondary | ICD-10-CM

## 2017-11-20 DIAGNOSIS — E559 Vitamin D deficiency, unspecified: Secondary | ICD-10-CM | POA: Diagnosis not present

## 2017-11-20 DIAGNOSIS — I2510 Atherosclerotic heart disease of native coronary artery without angina pectoris: Secondary | ICD-10-CM | POA: Diagnosis present

## 2017-11-20 DIAGNOSIS — Z823 Family history of stroke: Secondary | ICD-10-CM

## 2017-11-20 DIAGNOSIS — Z79899 Other long term (current) drug therapy: Secondary | ICD-10-CM

## 2017-11-20 DIAGNOSIS — W19XXXA Unspecified fall, initial encounter: Secondary | ICD-10-CM | POA: Diagnosis not present

## 2017-11-20 DIAGNOSIS — S0990XA Unspecified injury of head, initial encounter: Secondary | ICD-10-CM | POA: Diagnosis not present

## 2017-11-20 DIAGNOSIS — N179 Acute kidney failure, unspecified: Secondary | ICD-10-CM | POA: Diagnosis not present

## 2017-11-20 DIAGNOSIS — S199XXA Unspecified injury of neck, initial encounter: Secondary | ICD-10-CM | POA: Diagnosis not present

## 2017-11-20 DIAGNOSIS — I951 Orthostatic hypotension: Secondary | ICD-10-CM | POA: Diagnosis not present

## 2017-11-20 DIAGNOSIS — E1159 Type 2 diabetes mellitus with other circulatory complications: Secondary | ICD-10-CM | POA: Diagnosis not present

## 2017-11-20 DIAGNOSIS — M546 Pain in thoracic spine: Secondary | ICD-10-CM | POA: Diagnosis not present

## 2017-11-20 DIAGNOSIS — Y92009 Unspecified place in unspecified non-institutional (private) residence as the place of occurrence of the external cause: Secondary | ICD-10-CM

## 2017-11-20 DIAGNOSIS — Z833 Family history of diabetes mellitus: Secondary | ICD-10-CM

## 2017-11-20 DIAGNOSIS — Z8262 Family history of osteoporosis: Secondary | ICD-10-CM

## 2017-11-20 DIAGNOSIS — E119 Type 2 diabetes mellitus without complications: Secondary | ICD-10-CM | POA: Diagnosis present

## 2017-11-20 DIAGNOSIS — N1832 Chronic kidney disease, stage 3b: Secondary | ICD-10-CM | POA: Diagnosis present

## 2017-11-20 DIAGNOSIS — Z951 Presence of aortocoronary bypass graft: Secondary | ICD-10-CM

## 2017-11-20 DIAGNOSIS — R55 Syncope and collapse: Secondary | ICD-10-CM | POA: Diagnosis present

## 2017-11-20 DIAGNOSIS — M545 Low back pain, unspecified: Secondary | ICD-10-CM

## 2017-11-20 DIAGNOSIS — W1839XA Other fall on same level, initial encounter: Secondary | ICD-10-CM | POA: Diagnosis present

## 2017-11-20 DIAGNOSIS — Z820 Family history of epilepsy and other diseases of the nervous system: Secondary | ICD-10-CM

## 2017-11-20 DIAGNOSIS — Z8349 Family history of other endocrine, nutritional and metabolic diseases: Secondary | ICD-10-CM

## 2017-11-20 DIAGNOSIS — K5900 Constipation, unspecified: Secondary | ICD-10-CM | POA: Diagnosis present

## 2017-11-20 DIAGNOSIS — E785 Hyperlipidemia, unspecified: Secondary | ICD-10-CM | POA: Diagnosis present

## 2017-11-20 DIAGNOSIS — R404 Transient alteration of awareness: Secondary | ICD-10-CM | POA: Diagnosis not present

## 2017-11-20 DIAGNOSIS — Z8052 Family history of malignant neoplasm of bladder: Secondary | ICD-10-CM

## 2017-11-20 DIAGNOSIS — I10 Essential (primary) hypertension: Secondary | ICD-10-CM | POA: Diagnosis present

## 2017-11-20 DIAGNOSIS — S32010A Wedge compression fracture of first lumbar vertebra, initial encounter for closed fracture: Secondary | ICD-10-CM | POA: Diagnosis not present

## 2017-11-20 DIAGNOSIS — R61 Generalized hyperhidrosis: Secondary | ICD-10-CM | POA: Diagnosis not present

## 2017-11-20 DIAGNOSIS — Z7984 Long term (current) use of oral hypoglycemic drugs: Secondary | ICD-10-CM

## 2017-11-20 DIAGNOSIS — S3992XA Unspecified injury of lower back, initial encounter: Secondary | ICD-10-CM | POA: Diagnosis not present

## 2017-11-20 DIAGNOSIS — Z9089 Acquired absence of other organs: Secondary | ICD-10-CM

## 2017-11-20 DIAGNOSIS — K219 Gastro-esophageal reflux disease without esophagitis: Secondary | ICD-10-CM | POA: Diagnosis present

## 2017-11-20 HISTORY — DX: Non-ST elevation (NSTEMI) myocardial infarction: I21.4

## 2017-11-20 HISTORY — DX: Syncope and collapse: R55

## 2017-11-20 HISTORY — DX: Gastro-esophageal reflux disease without esophagitis: K21.9

## 2017-11-20 HISTORY — DX: Wedge compression fracture of first lumbar vertebra, initial encounter for closed fracture: S32.010A

## 2017-11-20 LAB — BASIC METABOLIC PANEL
Anion gap: 11 (ref 5–15)
BUN: 29 mg/dL — ABNORMAL HIGH (ref 8–23)
CALCIUM: 9.1 mg/dL (ref 8.9–10.3)
CO2: 18 mmol/L — AB (ref 22–32)
CREATININE: 1.08 mg/dL — AB (ref 0.44–1.00)
Chloride: 110 mmol/L (ref 98–111)
GFR calc non Af Amer: 51 mL/min — ABNORMAL LOW (ref >60)
GFR, EST AFRICAN AMERICAN: 59 mL/min — AB (ref >60)
Glucose, Bld: 215 mg/dL — ABNORMAL HIGH (ref 70–99)
Potassium: 3.7 mmol/L (ref 3.5–5.1)
SODIUM: 139 mmol/L (ref 135–145)

## 2017-11-20 LAB — URINALYSIS, ROUTINE W REFLEX MICROSCOPIC
Bilirubin Urine: NEGATIVE
Glucose, UA: 50 mg/dL — AB
KETONES UR: 5 mg/dL — AB
Nitrite: NEGATIVE
PH: 5 (ref 5.0–8.0)
Protein, ur: 100 mg/dL — AB
SPECIFIC GRAVITY, URINE: 1.023 (ref 1.005–1.030)

## 2017-11-20 LAB — CBC
HCT: 27.9 % — ABNORMAL LOW (ref 36.0–46.0)
Hemoglobin: 9.1 g/dL — ABNORMAL LOW (ref 12.0–15.0)
MCH: 28.8 pg (ref 26.0–34.0)
MCHC: 32.6 g/dL (ref 30.0–36.0)
MCV: 88.3 fL (ref 78.0–100.0)
PLATELETS: 222 10*3/uL (ref 150–400)
RBC: 3.16 MIL/uL — AB (ref 3.87–5.11)
RDW: 14 % (ref 11.5–15.5)
WBC: 10.5 10*3/uL (ref 4.0–10.5)

## 2017-11-20 LAB — CBG MONITORING, ED: Glucose-Capillary: 192 mg/dL — ABNORMAL HIGH (ref 70–99)

## 2017-11-20 MED ORDER — ASPIRIN EC 81 MG PO TBEC
81.0000 mg | DELAYED_RELEASE_TABLET | Freq: Every day | ORAL | Status: DC
  Administered 2017-11-21 – 2017-11-23 (×3): 81 mg via ORAL
  Filled 2017-11-20 (×3): qty 1

## 2017-11-20 MED ORDER — INSULIN ASPART 100 UNIT/ML ~~LOC~~ SOLN
0.0000 [IU] | Freq: Three times a day (TID) | SUBCUTANEOUS | Status: DC
  Administered 2017-11-21: 5 [IU] via SUBCUTANEOUS
  Administered 2017-11-21: 2 [IU] via SUBCUTANEOUS
  Administered 2017-11-21: 8 [IU] via SUBCUTANEOUS
  Administered 2017-11-22: 2 [IU] via SUBCUTANEOUS
  Administered 2017-11-22: 11 [IU] via SUBCUTANEOUS
  Administered 2017-11-22: 5 [IU] via SUBCUTANEOUS
  Administered 2017-11-23: 3 [IU] via SUBCUTANEOUS
  Administered 2017-11-23: 2 [IU] via SUBCUTANEOUS

## 2017-11-20 MED ORDER — FENTANYL CITRATE (PF) 100 MCG/2ML IJ SOLN
50.0000 ug | Freq: Once | INTRAMUSCULAR | Status: AC
  Administered 2017-11-20: 50 ug via INTRAVENOUS
  Filled 2017-11-20: qty 2

## 2017-11-20 MED ORDER — FAMOTIDINE 20 MG PO TABS
20.0000 mg | ORAL_TABLET | Freq: Two times a day (BID) | ORAL | Status: DC
Start: 2017-11-20 — End: 2017-11-21
  Administered 2017-11-21 (×2): 20 mg via ORAL
  Filled 2017-11-20 (×2): qty 1

## 2017-11-20 MED ORDER — SODIUM CHLORIDE 0.9 % IV BOLUS
500.0000 mL | Freq: Once | INTRAVENOUS | Status: AC
Start: 2017-11-20 — End: 2017-11-20
  Administered 2017-11-20: 500 mL via INTRAVENOUS

## 2017-11-20 MED ORDER — LORATADINE 10 MG PO TABS
10.0000 mg | ORAL_TABLET | Freq: Every day | ORAL | Status: DC
  Administered 2017-11-21 – 2017-11-23 (×3): 10 mg via ORAL
  Filled 2017-11-20 (×3): qty 1

## 2017-11-20 MED ORDER — OXYCODONE-ACETAMINOPHEN 5-325 MG PO TABS
1.0000 | ORAL_TABLET | Freq: Once | ORAL | Status: AC
  Administered 2017-11-20: 1 via ORAL
  Filled 2017-11-20: qty 1

## 2017-11-20 MED ORDER — OXYCODONE-ACETAMINOPHEN 5-325 MG PO TABS
1.0000 | ORAL_TABLET | Freq: Four times a day (QID) | ORAL | Status: DC | PRN
  Administered 2017-11-21 – 2017-11-23 (×8): 1 via ORAL
  Filled 2017-11-20 (×10): qty 1

## 2017-11-20 MED ORDER — ROSUVASTATIN CALCIUM 40 MG PO TABS
40.0000 mg | ORAL_TABLET | Freq: Every day | ORAL | Status: DC
  Administered 2017-11-21 – 2017-11-23 (×3): 40 mg via ORAL
  Filled 2017-11-20: qty 4
  Filled 2017-11-20: qty 1
  Filled 2017-11-20 (×2): qty 4
  Filled 2017-11-20 (×2): qty 1

## 2017-11-20 MED ORDER — SODIUM CHLORIDE 0.9% FLUSH
3.0000 mL | Freq: Two times a day (BID) | INTRAVENOUS | Status: DC
  Administered 2017-11-21 – 2017-11-22 (×3): 3 mL via INTRAVENOUS

## 2017-11-20 MED ORDER — BISACODYL 5 MG PO TBEC: 5.0000 mg | DELAYED_RELEASE_TABLET | Freq: Every day | ORAL | Status: DC | PRN

## 2017-11-20 MED ORDER — VITAMIN D 1000 UNITS PO TABS
1000.0000 [IU] | ORAL_TABLET | Freq: Every day | ORAL | Status: DC
  Administered 2017-11-21 – 2017-11-23 (×3): 1000 [IU] via ORAL
  Filled 2017-11-20 (×3): qty 1

## 2017-11-20 MED ORDER — METFORMIN HCL 500 MG PO TABS: 1000.0000 mg | ORAL_TABLET | Freq: Two times a day (BID) | ORAL | Status: DC

## 2017-11-20 MED ORDER — VITAMIN B-12 1000 MCG PO TABS
1000.0000 ug | ORAL_TABLET | Freq: Every day | ORAL | Status: DC
  Administered 2017-11-21 – 2017-11-23 (×3): 1000 ug via ORAL
  Filled 2017-11-20 (×4): qty 1

## 2017-11-20 MED ORDER — ENOXAPARIN SODIUM 40 MG/0.4ML ~~LOC~~ SOLN
40.0000 mg | SUBCUTANEOUS | Status: DC
  Administered 2017-11-21 (×2): 40 mg via SUBCUTANEOUS
  Filled 2017-11-20 (×2): qty 0.4

## 2017-11-20 MED ORDER — LISINOPRIL 2.5 MG PO TABS: 2.5000 mg | ORAL_TABLET | Freq: Every day | ORAL | Status: DC

## 2017-11-20 MED ORDER — SERTRALINE HCL 25 MG PO TABS
25.0000 mg | ORAL_TABLET | Freq: Every day | ORAL | Status: DC
  Administered 2017-11-21 – 2017-11-23 (×3): 25 mg via ORAL
  Filled 2017-11-20 (×3): qty 1

## 2017-11-20 NOTE — ED Notes (Addendum)
Per pt's husband pt got off the couch, walked to the the door and had a syncopal episode, hit back of head; unresponsive for approximately 5 minutes; pt's family states she had open heart sx 3 years ago; c/o lower back pain

## 2017-11-20 NOTE — ED Notes (Signed)
Patient transported to CT 

## 2017-11-20 NOTE — H&P (Addendum)
History and Physical    BATOUL LIMES UXL:[id number] DOB: 07/10/1945 DOA: 11/20/2017  PCP: Binnie Rail, MD  Patient coming from: Home  I have personally briefly reviewed patient's old medical records in [place]  Chief Complaint: Syncope  HPI: Kathryn Logan is a 72 y.o. female with medical history significant of DM2, CAD s/p CABG.  Patient presents to ED after syncopal episode that occurred at home after she got up from couch to go answer the door.  She fell back, hit head, was unresponsive for 5 min per family.  Has low back pain that is worse with movement following the fall.   ED Course: L1 superior endplate compression fx.  NS consulted but said just put in TLSO brace and follow up with them in office.  Pain controlled with 1 percocet.  Hospitalist asked to admit for syncope work up.   Review of Systems: As per HPI otherwise 10 point review of systems negative.   Past Medical History:  Diagnosis Date  . Diabetes mellitus without complication ([place])   . Gallbladder sludge   . Heart disease   . Hyperlipidemia   . Osteopenia     Past Surgical History:  Procedure Laterality Date  . CORONARY ARTERY BYPASS GRAFT N/A 12/01/2013   Procedure: CORONARY ARTERY BYPASS GRAFTING (CABG) x three, using left internal mammary artery, and right leg saphenous vein harvested endoscopically (LIMA to LAD, SVG to OM2 , SVG to dRCA);  Surgeon: Grace Isaac, MD;  Location: [facility];  Service: Open Heart Surgery;  Laterality: N/A;  . G 1 P 1    . INTRAOPERATIVE TRANSESOPHAGEAL ECHOCARDIOGRAM N/A 12/01/2013   Procedure: INTRAOPERATIVE TRANSESOPHAGEAL ECHOCARDIOGRAM;  Surgeon: Grace Isaac, MD;  Location: [facility];  Service: Open Heart Surgery;  Laterality: N/A;  . LEFT HEART CATHETERIZATION WITH CORONARY ANGIOGRAM N/A 11/27/2013   Procedure: LEFT HEART CATHETERIZATION WITH CORONARY ANGIOGRAM;  Surgeon: Sinclair Grooms, MD;  Location: [facility] CATH LAB;  Service: Cardiovascular;  Laterality:  N/A;  . no colonoscopy     SOC reviewed  . TONSILLECTOMY AND ADENOIDECTOMY       reports that she has never smoked. She has never used smokeless tobacco. She reports that she drinks alcohol. She reports that she does not use drugs.  No Known Allergies  Family History  Problem Relation Age of Onset  . Heart attack Mother 53  . Diabetes Mother   . Stroke Mother   . Hypertension Mother   . Goiter Mother   . Heart attack Father        >55  . Bladder Cancer Father   . Parkinson's disease Maternal Aunt   . Osteoporosis Sister   . Diabetes Brother      Prior to Admission medications   Medication Sig Start Date End Date Taking? Authorizing Provider  aspirin EC 81 MG tablet Take 81 mg by mouth daily.    Yes [provider]  BIOTIN PO Take 5,000 mcg by mouth daily.    Yes [provider]  bisacodyl (DULCOLAX) 5 MG EC tablet Take 5 mg by mouth daily as needed for moderate constipation.    Yes [provider]  cholecalciferol (VITAMIN D) 1000 units tablet Take 1,000 Units by mouth daily.   Yes [provider]  lisinopril (PRINIVIL,ZESTRIL) 2.5 MG tablet Take 1 tablet (2.5 mg total) by mouth daily. NEED OV. 06/28/17  Yes Hilty, Nadean Corwin, MD  loratadine (CLARITIN) 10 MG tablet Take 1 tablet (10  mg total) by mouth daily. 02/22/16  Yes Burns, Claudina Lick, MD  metFORMIN (GLUCOPHAGE) 1000 MG tablet Take 1 tablet (1,000 mg total) by mouth 2 (two) times daily with a meal. 04/17/17  Yes Burns, Claudina Lick, MD  ranitidine (ZANTAC) 150 MG tablet Take 1 tablet (150 mg total) by mouth 2 (two) times daily. 11/12/17  Yes Burns, Claudina Lick, MD  rosuvastatin (CRESTOR) 40 MG tablet Take 1 tablet (40 mg total) by mouth daily. 04/17/17  Yes Burns, Claudina Lick, MD  sertraline (ZOLOFT) 25 MG tablet Take 1 tablet (25 mg total) by mouth daily. 11/12/17  Yes Burns, Claudina Lick, MD  vitamin B-12 (CYANOCOBALAMIN) 1000 MCG tablet Take 1,000 mcg by mouth daily.   Yes [provider]  glucose blood  (ONE TOUCH ULTRA TEST) test strip Check blood sugar daily as directed, DX 250.02 07/24/13   Hendricks Limes, MD  [facility] DELICA LANCETS FINE MISC 1 each by Does not apply route as directed. Check blood sugar daily as directed, DX 250.02 HOLD UNTIL PATIENT REQUEST 09/09/12   Hendricks Limes, MD    Physical Exam: Vitals:   11/20/17 2200 11/20/17 2230 11/20/17 2245 11/20/17 2300  BP: 132/77 139/70  123/68  Pulse: 96 91  94  Resp: 14 14  14   Temp:      TempSrc:      SpO2: 93% 96% 98%   Weight:      Height:        Constitutional: NAD, calm, comfortable Eyes: PERRL, lids and conjunctivae normal ENMT: Mucous membranes are moist. Posterior pharynx clear of any exudate or lesions.Normal dentition.  Neck: normal, supple, no masses, no thyromegaly Respiratory: clear to auscultation bilaterally, no wheezing, no crackles. Normal respiratory effort. No accessory muscle use.  Cardiovascular: Regular rate and rhythm, no murmurs / rubs / gallops. No extremity edema. 2+ pedal pulses. No carotid bruits.  Abdomen: no tenderness, no masses palpated. No hepatosplenomegaly. Bowel sounds positive.  Musculoskeletal: no clubbing / cyanosis. No joint deformity upper and lower extremities. Good ROM, no contractures. Normal muscle tone.  Skin: no rashes, lesions, ulcers. No induration Neurologic: CN 2-12 grossly intact. Sensation intact, DTR normal. Strength 5/5 in all 4.  Psychiatric: Normal judgment and insight. Alert and oriented x 3. Normal mood.    Labs on Admission: I have personally reviewed following labs and imaging studies  CBC: Recent Labs  Lab 11/20/17 1700  WBC 10.5  HGB 9.1*  HCT 27.9*  MCV 88.3  PLT 621   Basic Metabolic Panel: Recent Labs  Lab 11/20/17 1700  NA 139  K 3.7  CL 110  CO2 18*  GLUCOSE 215*  BUN 29*  CREATININE 1.08*  CALCIUM 9.1   GFR: Estimated Creatinine Clearance: 42.5 mL/min (A) (by C-G formula based on SCr of 1.08 mg/dL (H)). Liver Function Tests: No  results for input(s): AST, ALT, ALKPHOS, BILITOT, PROT, ALBUMIN in the last 168 hours. No results for input(s): LIPASE, AMYLASE in the last 168 hours. No results for input(s): AMMONIA in the last 168 hours. Coagulation Profile: No results for input(s): INR, PROTIME in the last 168 hours. Cardiac Enzymes: No results for input(s): CKTOTAL, CKMB, CKMBINDEX, TROPONINI in the last 168 hours. BNP (last 3 results) No results for input(s): PROBNP in the last 8760 hours. HbA1C: No results for input(s): HGBA1C in the last 72 hours. CBG: Recent Labs  Lab 11/20/17 1658  GLUCAP 192*   Lipid Profile: No results for input(s): CHOL, HDL, LDLCALC, TRIG, CHOLHDL, LDLDIRECT in  the last 72 hours. Thyroid Function Tests: No results for input(s): TSH, T4TOTAL, FREET4, T3FREE, THYROIDAB in the last 72 hours. Anemia Panel: No results for input(s): VITAMINB12, FOLATE, FERRITIN, TIBC, IRON, RETICCTPCT in the last 72 hours. Urine analysis:    Component Value Date/Time   COLORURINE YELLOW 11/20/2017 2207   APPEARANCEUR CLOUDY (A) 11/20/2017 2207   LABSPEC 1.023 11/20/2017 2207   PHURINE 5.0 11/20/2017 2207   GLUCOSEU 50 (A) 11/20/2017 2207   HGBUR SMALL (A) 11/20/2017 2207   BILIRUBINUR NEGATIVE 11/20/2017 2207   KETONESUR 5 (A) 11/20/2017 2207   PROTEINUR 100 (A) 11/20/2017 2207   UROBILINOGEN 1.0 11/30/2013 1021   NITRITE NEGATIVE 11/20/2017 2207   LEUKOCYTESUR TRACE (A) 11/20/2017 2207    Radiological Exams on Admission: Dg Thoracic Spine 2 View  Result Date: 11/20/2017 CLINICAL DATA:  Pain after fall.  Mid to low back pain. EXAM: THORACIC SPINE 2 VIEWS COMPARISON:  None. FINDINGS: Limited views of the chest are normal.  Sternotomy wires are intact. No inter pedicular widening is identified. No traumatic malalignment identified. There is anterior wedging of L1 with approximately 15 or 20 % loss of anterior height. This was not present in 2015 but is otherwise age indeterminate. No other fractures  are seen. IMPRESSION: 1. Anterior wedging of L1 with approximately 15 or 20% loss of anterior height. This finding was not present 2015. It is otherwise age indeterminate. However, an acute compression fracture is not excluded on this study. MRI could better assess acuity. Electronically Signed   By: Dorise Bullion III M.D   On: 11/20/2017 19:30   Dg Lumbar Spine 2-3 Views  Result Date: 11/20/2017 CLINICAL DATA:  Pain after fall. EXAM: LUMBAR SPINE - 2-3 VIEW COMPARISON:  None. FINDINGS: No inter pedicular widening or traumatic malalignment. Anterior wedging of L1 with approximately 15 or 20% loss of anterior height. This was not present 2015 but is otherwise age indeterminate. No other fractures are seen. Mild multilevel degenerative disc disease. IMPRESSION: Anterior wedging of L1 with 15 or 20% loss of anterior height. This was not present in 2015 but is otherwise age indeterminate. An acute compression fracture is not excluded. MRI could better assess acuity as clinically warranted. Electronically Signed   By: Dorise Bullion III M.D   On: 11/20/2017 19:31   Ct Head Wo Contrast  Result Date: 11/20/2017 CLINICAL DATA:  Syncopal episode today with a fall and blow to the back of the head. Initial encounter. EXAM: CT HEAD WITHOUT CONTRAST CT CERVICAL SPINE WITHOUT CONTRAST TECHNIQUE: Multidetector CT imaging of the head and cervical spine was performed following the standard protocol without intravenous contrast. Multiplanar CT image reconstructions of the cervical spine were also generated. COMPARISON:  None. FINDINGS: CT HEAD FINDINGS Brain: No evidence of acute infarction, hemorrhage, hydrocephalus, extra-axial collection or mass lesion/mass effect. Atrophy is noted. Vascular: Atherosclerosis is seen. Skull: Intact.  No focal lesion. Sinuses/Orbits: Mucous retention cyst or polyp left maxillary sinus noted. Other: None. CT CERVICAL SPINE FINDINGS Alignment: Normal. Skull base and vertebrae: No acute  fracture. No primary bone lesion or focal pathologic process. Soft tissues and spinal canal: No prevertebral fluid or swelling. No visible canal hematoma. Disc levels: Loss of disc space height and endplate spurring are seen at C5-6. Upper chest: Lung apices are clear. Other: None. IMPRESSION: No acute abnormality head or cervical spine. Cortical atrophy. Atherosclerosis. C5-6 degenerative disc disease. Electronically Signed   By: Inge Rise M.D.   On: 11/20/2017 18:58   Ct Cervical  Spine Wo Contrast  Result Date: 11/20/2017 CLINICAL DATA:  Syncopal episode today with a fall and blow to the back of the head. Initial encounter. EXAM: CT HEAD WITHOUT CONTRAST CT CERVICAL SPINE WITHOUT CONTRAST TECHNIQUE: Multidetector CT imaging of the head and cervical spine was performed following the standard protocol without intravenous contrast. Multiplanar CT image reconstructions of the cervical spine were also generated. COMPARISON:  None. FINDINGS: CT HEAD FINDINGS Brain: No evidence of acute infarction, hemorrhage, hydrocephalus, extra-axial collection or mass lesion/mass effect. Atrophy is noted. Vascular: Atherosclerosis is seen. Skull: Intact.  No focal lesion. Sinuses/Orbits: Mucous retention cyst or polyp left maxillary sinus noted. Other: None. CT CERVICAL SPINE FINDINGS Alignment: Normal. Skull base and vertebrae: No acute fracture. No primary bone lesion or focal pathologic process. Soft tissues and spinal canal: No prevertebral fluid or swelling. No visible canal hematoma. Disc levels: Loss of disc space height and endplate spurring are seen at C5-6. Upper chest: Lung apices are clear. Other: None. IMPRESSION: No acute abnormality head or cervical spine. Cortical atrophy. Atherosclerosis. C5-6 degenerative disc disease. Electronically Signed   By: Inge Rise M.D.   On: 11/20/2017 18:58   Ct Lumbar Spine Wo Contrast  Result Date: 11/20/2017 CLINICAL DATA:  Low back pain since a fall due to a  syncopal episode earlier today. Initial encounter. EXAM: CT LUMBAR SPINE WITHOUT CONTRAST TECHNIQUE: Multidetector CT imaging of the lumbar spine was performed without intravenous contrast administration. Multiplanar CT image reconstructions were also generated. COMPARISON:  Plain films lumbar spine earlier today. FINDINGS: Segmentation: Standard. Alignment: Maintained. Vertebrae: The patient has an L1 superior endplate compression fracture with vertebral body height loss anteriorly of approximately 35%. Fracture margins are sharp consistent with an acute injury. No involvement of the posterior elements is identified. No other fracture is seen. Paraspinal and other soft tissues: Atherosclerotic vascular disease is noted. No acute abnormality. Disc levels: T11-12: Negative. T12-L1: Bony retropulsion off the superior endplate of L1 is more prominent to the left. There is mild central canal narrowing and narrowing the left lateral recess which could impact the descending left L1 root. L1-2: A left lateral recess protrusion causes mild appearing narrowing in the left lateral recess and could potentially impact the descending left L2 root. The foramina are open. L2-3: Negative. L3-4: Negative. L4-5: There is a shallow disc bulge and some ligamentum flavum thickening. Mild central canal narrowing is seen. The foramina are open. L5-S1: Shallow disc bulge without stenosis. IMPRESSION: Acute superior endplate compression fracture of L1 with approximately 35% vertebral body height loss. Bony retropulsion into the central canal causes mild central canal narrowing and narrowing in the left lateral recess which could impact the descending left L1 root. Left lateral recess protrusion at L1-2 could potentially irritate the descending left L2 root. Atherosclerosis. Electronically Signed   By: Inge Rise M.D.   On: 11/20/2017 20:58    EKG: Independently reviewed.  Assessment/Plan Principal Problem:   Syncope Active  Problems:   Type 2 diabetes mellitus with vascular disease (HCC)   Compression fracture of L1 lumbar vertebra, closed, initial encounter ([place])    1. Syncope - 1. Syncope pathway and work up 2. Repeat CBC/ BMP in AM 3. Tele monitor 4. 2d echo 2. DM2 - 1. Hold metformin while we check a lactate level given the low bicarb 2. Mod scale SSI AC in the mean time 3. Hold lisnopril 3. L1 compression fx - 1. TLSO brace 2. Percocet PRN pain 3. Follow up with NS  DVT  prophylaxis: Lovenox Code Status: Full Family Communication: No family in room Disposition Plan: Home after admit Consults called: None Admission status: Place in obs   Kamika Goodloe, [facility] [facility] Pager [phone] Only works nights!  If 7AM-7PM, please contact the primary day team physician taking care of patient  [email or web address] Password [email or web address]  11/20/2017, 11:43 PM

## 2017-11-20 NOTE — ED Notes (Signed)
Ortho tech paged  

## 2017-11-20 NOTE — ED Notes (Signed)
ED Provider at bedside. 

## 2017-11-20 NOTE — ED Notes (Signed)
Ortho contacted and will be contacting biomed for TSLO brace.

## 2017-11-20 NOTE — ED Notes (Signed)
Patient transported to X-ray 

## 2017-11-20 NOTE — Progress Notes (Signed)
Orthopedic Tech Progress Note Patient Details:  Kathryn Logan May 25, 1945 [id number]  Patient ID: Kathryn Logan, female   DOB: 08-19-1945, 72 y.o.   MRN: [id number]   [name] 11/20/2017, 10:55 PMCalled [employer] for TLSO brace.

## 2017-11-21 ENCOUNTER — Encounter (HOSPITAL_COMMUNITY): Payer: Self-pay | Admitting: *Deleted

## 2017-11-21 ENCOUNTER — Observation Stay (HOSPITAL_BASED_OUTPATIENT_CLINIC_OR_DEPARTMENT_OTHER): Payer: Medicare Other

## 2017-11-21 DIAGNOSIS — E1159 Type 2 diabetes mellitus with other circulatory complications: Secondary | ICD-10-CM

## 2017-11-21 DIAGNOSIS — S32010A Wedge compression fracture of first lumbar vertebra, initial encounter for closed fracture: Secondary | ICD-10-CM

## 2017-11-21 DIAGNOSIS — R55 Syncope and collapse: Secondary | ICD-10-CM

## 2017-11-21 DIAGNOSIS — J9811 Atelectasis: Secondary | ICD-10-CM | POA: Diagnosis not present

## 2017-11-21 HISTORY — DX: Wedge compression fracture of first lumbar vertebra, initial encounter for closed fracture: S32.010A

## 2017-11-21 LAB — BASIC METABOLIC PANEL
Anion gap: 12 (ref 5–15)
BUN: 28 mg/dL — ABNORMAL HIGH (ref 8–23)
CALCIUM: 9.3 mg/dL (ref 8.9–10.3)
CO2: 19 mmol/L — ABNORMAL LOW (ref 22–32)
CREATININE: 1.22 mg/dL — AB (ref 0.44–1.00)
Chloride: 108 mmol/L (ref 98–111)
GFR, EST AFRICAN AMERICAN: 51 mL/min — AB (ref >60)
GFR, EST NON AFRICAN AMERICAN: 44 mL/min — AB (ref >60)
GLUCOSE: 140 mg/dL — AB (ref 70–99)
Potassium: 4 mmol/L (ref 3.5–5.1)
Sodium: 139 mmol/L (ref 135–145)

## 2017-11-21 LAB — ECHOCARDIOGRAM COMPLETE
Height: 62 in
WEIGHTICAEL: 2172.85 [oz_av]

## 2017-11-21 LAB — CBC
HCT: 29 % — ABNORMAL LOW (ref 36.0–46.0)
Hemoglobin: 9.7 g/dL — ABNORMAL LOW (ref 12.0–15.0)
MCH: 29 pg (ref 26.0–34.0)
MCHC: 33.4 g/dL (ref 30.0–36.0)
MCV: 86.6 fL (ref 78.0–100.0)
PLATELETS: 196 10*3/uL (ref 150–400)
RBC: 3.35 MIL/uL — ABNORMAL LOW (ref 3.87–5.11)
RDW: 14 % (ref 11.5–15.5)
WBC: 8.9 10*3/uL (ref 4.0–10.5)

## 2017-11-21 LAB — GLUCOSE, CAPILLARY
GLUCOSE-CAPILLARY: 134 mg/dL — AB (ref 70–99)
GLUCOSE-CAPILLARY: 224 mg/dL — AB (ref 70–99)
GLUCOSE-CAPILLARY: 274 mg/dL — AB (ref 70–99)
Glucose-Capillary: 230 mg/dL — ABNORMAL HIGH (ref 70–99)

## 2017-11-21 LAB — HIV ANTIBODY (ROUTINE TESTING W REFLEX): HIV SCREEN 4TH GENERATION: NONREACTIVE

## 2017-11-21 LAB — LACTIC ACID, PLASMA
LACTIC ACID, VENOUS: 1.1 mmol/L (ref 0.5–1.9)
LACTIC ACID, VENOUS: 1 mmol/L (ref 0.5–1.9)

## 2017-11-21 LAB — TROPONIN I: Troponin I: <0.03 ng/mL (ref <0.03)

## 2017-11-21 MED ORDER — SODIUM CHLORIDE 0.9 % IV SOLN
INTRAVENOUS | Status: DC
  Administered 2017-11-21 – 2017-11-22 (×2): via INTRAVENOUS

## 2017-11-21 MED ORDER — FAMOTIDINE 20 MG PO TABS
20.0000 mg | ORAL_TABLET | Freq: Two times a day (BID) | ORAL | Status: DC
  Administered 2017-11-22: 20 mg via ORAL
  Filled 2017-11-21: qty 1

## 2017-11-21 MED ORDER — ONDANSETRON 4 MG PO TBDP
8.0000 mg | ORAL_TABLET | Freq: Three times a day (TID) | ORAL | Status: DC | PRN
Start: 2017-11-21 — End: 2017-11-23
  Administered 2017-11-21: 8 mg via ORAL
  Filled 2017-11-21 (×3): qty 2

## 2017-11-21 NOTE — Plan of Care (Signed)

## 2017-11-21 NOTE — Progress Notes (Signed)
PT Cancellation Note  Patient Details Name: Kathryn Logan MRN: [id number] DOB: Oct 21, 1945   Cancelled Treatment:    Reason Eval/Treat Not Completed: PT eval received, chart reviewed. Pt currently awaiting TLSO. Will initiate PT eval when brace has been delivered.   Thelma Comp 11/21/2017, 9:01 AM   Rolinda Roan, PT, DPT Acute Rehabilitation Services Pager: [phone]

## 2017-11-21 NOTE — Evaluation (Signed)
Physical Therapy Evaluation Patient Details Name: Kathryn Logan MRN: [id number] DOB: [birth year] Today's Date: 11/21/2017   History of Present Illness  Pt is a 72 y/o female who presents s/p syncopal episode at home during which she hit the back of her head and was unresponsive for approximately 5 minutes. Imaging revealed L1 superior endplate compression fracture with 35% loss in vertebral body height. Per neurosurgery, TLSO ordered. PMH significant for osteopenia, NSTEMI s/p CABG, DM, CAD.  Clinical Impression  Pt admitted with above diagnosis. Pt currently with functional limitations due to the deficits listed below (see PT Problem List). At the time of PT eval pt was able to perform transfers and ambulation with gross min assist to min guard assist for balance support and safety. Pt and husband were educated on brace application/wearing schedule, precautions, car transfer, and general safety with activity progression at home. Pt will benefit from skilled PT to increase their independence and safety with mobility to allow discharge to the venue listed below.       Follow Up Recommendations Outpatient PT;Supervision for mobility/OOB    Equipment Recommendations  None recommended by PT    Recommendations for Other Services       Precautions / Restrictions Precautions Precautions: Fall;Back Precaution Booklet Issued: Yes (comment) Precaution Comments: Reviewed in detail with pt and husband Required Braces or Orthoses: Spinal Brace Spinal Brace: Thoracolumbosacral orthotic;Applied in sitting position Restrictions Weight Bearing Restrictions: No      Mobility  Bed Mobility Overal bed mobility: Needs Assistance Bed Mobility: Rolling;Sidelying to Sit Rolling: Min assist Sidelying to sit: Min assist       General bed mobility comments: HOB flat and rails lowered to simulate home environment. Pt required assist to fully roll and to elevate trunk to full sitting position.    Transfers Overall transfer level: Needs assistance Equipment used: Rolling walker (2 wheeled) Transfers: Sit to/from Stand Sit to Stand: Min guard         General transfer comment: Close guard for safety as pt powered up to full standing position. VC's for hand placement on seated surface for safety.   Ambulation/Gait Ambulation/Gait assistance: Min guard Gait Distance (Feet): 200 Feet Assistive device: Rolling walker (2 wheeled) Gait Pattern/deviations: Step-through pattern;Decreased stride length;Trunk flexed Gait velocity: Decreased Gait velocity interpretation: 1.31 - 2.62 ft/sec, indicative of limited community ambulator General Gait Details: VC's for general safety with RW. Pt demonstrating good posture overall. Generally slow and guarded.   Stairs            Wheelchair Mobility    Modified Rankin (Stroke Patients Only)       Balance Overall balance assessment: Needs assistance Sitting-balance support: Feet supported;No upper extremity supported Sitting balance-Leahy Scale: Fair     Standing balance support: Bilateral upper extremity supported Standing balance-Leahy Scale: Poor Standing balance comment: Reliant on UE support at this time.                              Pertinent Vitals/Pain Pain Assessment: Faces Faces Pain Scale: Hurts even more Pain Location: back Pain Descriptors / Indicators: Discomfort;Aching Pain Intervention(s): Monitored during session;Repositioned    Home Living Family/patient expects to be discharged to:: Private residence Living Arrangements: Spouse/significant other Available Help at Discharge: Family Type of Home: House Home Access: Stairs to enter   Technical brewer of Steps: 7 Home Layout: One level Home Equipment: Tub bench;Cane - single point;Bedside commode;Walker - 2 wheels  Prior Function Level of Independence: Independent               Hand Dominance        Extremity/Trunk  Assessment   Upper Extremity Assessment Upper Extremity Assessment: Defer to OT evaluation    Lower Extremity Assessment Lower Extremity Assessment: Overall WFL for tasks assessed    Cervical / Trunk Assessment Cervical / Trunk Assessment: Other exceptions Cervical / Trunk Exceptions: L1 compression fracture  Communication   Communication: No difficulties  Cognition Arousal/Alertness: Awake/alert Behavior During Therapy: WFL for tasks assessed/performed Overall Cognitive Status: Within Functional Limits for tasks assessed                                        General Comments      Exercises     Assessment/Plan    PT Assessment Patient needs continued PT services  PT Problem List Decreased strength;Decreased range of motion;Decreased activity tolerance;Decreased balance;Decreased mobility;Decreased knowledge of use of DME;Decreased safety awareness;Decreased knowledge of precautions;Pain       PT Treatment Interventions DME instruction;Gait training;Stair training;Functional mobility training;Therapeutic activities;Therapeutic exercise;Neuromuscular re-education;Patient/family education    PT Goals (Current goals can be found in the Care Plan section)  Acute Rehab PT Goals Patient Stated Goal: Home at d/c, decrease pain PT Goal Formulation: With patient/family Time For Goal Achievement: 11/28/17 Potential to Achieve Goals: Good    Frequency Min 4X/week   Barriers to discharge        Co-evaluation               AM-PAC PT "6 Clicks" Daily Activity  Outcome Measure Difficulty turning over in bed (including adjusting bedclothes, sheets and blankets)?: A Little Difficulty moving from lying on back to sitting on the side of the bed? : A Little Difficulty sitting down on and standing up from a chair with arms (e.g., wheelchair, bedside commode, etc,.)?: A Little Help needed moving to and from a bed to chair (including a wheelchair)?: A  Little Help needed walking in hospital room?: A Little Help needed climbing 3-5 steps with a railing? : A Little 6 Click Score: 18    End of Session Equipment Utilized During Treatment: Gait belt;Back brace Activity Tolerance: Patient tolerated treatment well Patient left: in chair;with call bell/phone within reach;with family/visitor present Nurse Communication: Mobility status PT Visit Diagnosis: Pain;Other abnormalities of gait and mobility (R26.89) Pain - part of body: (back)    Time: 4734-0370 PT Time Calculation (min) (ACUTE ONLY): 37 min   Charges:   PT Evaluation $PT Eval Moderate Complexity: 1 Mod PT Treatments $Gait Training: 8-22 mins   PT G Codes:        Rolinda Roan, PT, DPT Acute Rehabilitation Services Pager: [phone]   Thelma Comp 11/21/2017, 3:10 PM

## 2017-11-21 NOTE — Progress Notes (Signed)
[facility] PROGRESS NOTE  FLOR[place] NOLTON  BJS:[id number] DOB: August 02, 1945 DOA: 11/20/2017 PCP: Binnie Rail, MD  Brief Narrative: Kathryn Logan is a 72 year old female with past medical history including CAD status post CABG, type 2 diabetes mellitus, hypertension, hyperlipidemia who presented with syncope and back pain.Just prior to arrival, the patient states she was at her house and sleeping on the couch when her husband came home with groceries at the front door. She got up and walked to the door and told him that she was going to help him with the groceries. The next thing she knew, she was on the floor. Husband states that she lost consciousness rapidly, fell to the floor striking head and back on the wall and returned to normal alertness/awareness and mentation quickly. She was down for an unknown amount of time. The husband initially reported 5 minutes, but isn't sure. No seizurelike activity noted. No h/o seizures or syncope. She's had no recent anginal or CHF symptoms or illnesses. Denies presyncopal symptoms.   Evaluation in the ED revealed acute L1 compression fractures for which neurosurgery was consulted and recommended TLSO brace and outpatient follow up. Echocardiogram showed improved cardiac function from prior. Telemetry has shown no significant arrhythmias. TLSO brace applied and PT has worked with the patient. She has become nauseated and orthostatic vital signs are positive, so she will be kept an additional night for IV fluids and BP monitoring. Therapy evaluation to continue 7/11.  Subjective: Had nausea and emesis this afternoon, has not been eating anything. No chest pain, dyspnea, palpitations, orthopnea.   Objective: BP 128/71 (BP Location: Left Arm)   Pulse 86   Temp 98.1 F (36.7 C)   Resp 16   Ht 5\' 2"  (1.575 m)   Wt 61.6 kg (135 lb 12.9 oz)   SpO2 95%   BMI 24.84 kg/m   Gen: Tired-appearing, weak female in no distress Pulm: Clear and nonlabored  on room air  CV: RRR, no murmur, no JVD, no edema GI: Soft, NT, ND, +BS  Neuro: Alert and oriented. No focal deficits. MSK: Midline point tenderness over superior lumbar spine.  Skin: No rashes, lesions or ulcers  Assessment & Plan: Syncope: Orthostatic (with + vital signs and baseline soft BPs, had gotten up from sleeping on couch abruptly prior to sxs) and/or arrhythmogenic (quick on/quick off) vs. seizure (prolonged time down but no stereotyped activity or h/o Sz's and no postictal state reported). Prolonged time down could be due to concussion? - Continue telemetry - Discussed with cardiology who will begin certification for outpatient cardiac monitoring.  - Given vomiting and soft BPs with +orthostasis, will give IVF's overnight and monitor BP/orthostatic VS's. EF normalized.   Traumatic L1 compression fracture:  - Percocet - TLSO, f/u with neurosurgery as outpatient - PT/OT - Vit D  HTN:  - Hold lisinopril. Outpatient records show hypotension limiting medications.   Hyperlipidemia: Continue crestor  Patrecia Pour, MD [facility] [email or web address] Password [email or web address] 11/21/2017, 4:50 PM

## 2017-11-21 NOTE — ED Notes (Signed)
Attempted report 

## 2017-11-21 NOTE — Plan of Care (Signed)
  Problem: Education: Goal: Knowledge of General Education information will improve Outcome: Progressing   Problem: Activity: Goal: Risk for activity intolerance will decrease Outcome: Progressing   Problem: Elimination: Goal: Will not experience complications related to bowel motility Outcome: Progressing   Problem: Pain Managment: Goal: General experience of comfort will improve Outcome: Progressing   Problem: Safety: Goal: Ability to remain free from injury will improve Outcome: Progressing   

## 2017-11-21 NOTE — Progress Notes (Signed)
Orthopedic Tech Progress Note Patient Details:  Kathryn Logan 05-May-1946 [id number] Called in bio-tech brace order; spoke with Tye [place] Patient ID: Kathryn Logan, female   DOB: 03-18-1946, 72 y.o.   MRN: [id number]   Kathryn Logan 11/21/2017, 10:58 AM

## 2017-11-21 NOTE — Progress Notes (Signed)
  Echocardiogram 2D Echocardiogram has been performed.  Kathryn Logan 11/21/2017, 10:12 AM

## 2017-11-21 NOTE — ED Provider Notes (Signed)
[facility] Provider Note   CSN: [id number] Arrival date & time: 11/20/17  1651     History   Chief Complaint Chief Complaint  Patient presents with  . Fall  . Loss of Consciousness    HPI Kathryn Logan is a 72 y.o. female.  72 year old female with past medical history including CAD status post CABG, type 2 diabetes mellitus, hypertension, hyperlipidemia who presents with syncope and back pain.  Just prior to arrival, the patient states she was at her house and sleeping on the couch when her husband came home with groceries at the front door.  She got up and walked to the door and told him that she was going to help him with the groceries.  The next thing she knew, she was on the floor.  Husband states that she suddenly syncopized and fell back against the wall, striking the back of her head and her back along the wall.  She was unconscious for about 5 minutes but breathing spontaneously.  No jerking or shaking movements.  She denies any associated chest pain or shortness of breath, no prodromal symptoms before syncope.  Currently, she endorses moderate to severe, constant pain in her lower back that is worse with movements.  She denies any leg weakness or numbness.  No other areas of pain.  No anticoagulant use.  Fevers or recent illness.  The history is provided by the patient.  Fall   Loss of Consciousness      Past Medical History:  Diagnosis Date  . Diabetes mellitus without complication ([place])   . Gallbladder sludge   . Heart disease   . Hyperlipidemia   . Osteopenia     Patient Active Problem List   Diagnosis Date Noted  . Syncope 11/20/2017  . Compression fracture of L1 lumbar vertebra, closed, initial encounter ([place]) 11/20/2017  . Upper respiratory tract infection 05/18/2017  . Vitamin D deficiency 02/22/2016  . Anxiety 02/22/2016  . CAD (coronary artery disease) 02/21/2016  . GERD (gastroesophageal reflux disease)  04/14/2015  . Inappropriate sinus tachycardia 03/16/2015  . S/P CABG x 3 12/01/2013  . NSTEMI (non-ST elevated myocardial infarction) ([place]) 11/26/2013  . Type 2 diabetes mellitus with vascular disease ([place]) 08/28/2012  . Hyperlipidemia 12/18/2006  . Osteopenia 10/09/2006    Past Surgical History:  Procedure Laterality Date  . CORONARY ARTERY BYPASS GRAFT N/A 12/01/2013   Procedure: CORONARY ARTERY BYPASS GRAFTING (CABG) x three, using left internal mammary artery, and right leg saphenous vein harvested endoscopically (LIMA to LAD, SVG to OM2 , SVG to dRCA);  Surgeon: Grace Isaac, MD;  Location: [facility];  Service: Open Heart Surgery;  Laterality: N/A;  . G 1 P 1    . INTRAOPERATIVE TRANSESOPHAGEAL ECHOCARDIOGRAM N/A 12/01/2013   Procedure: INTRAOPERATIVE TRANSESOPHAGEAL ECHOCARDIOGRAM;  Surgeon: Grace Isaac, MD;  Location: [facility] [place];  Service: Open Heart Surgery;  Laterality: N/A;  . LEFT HEART CATHETERIZATION WITH CORONARY ANGIOGRAM N/A 11/27/2013   Procedure: LEFT HEART CATHETERIZATION WITH CORONARY ANGIOGRAM;  Surgeon: Sinclair Grooms, MD;  Location: [facility] CATH [facility];  Service: Cardiovascular;  Laterality: N/A;  . no colonoscopy     SOC reviewed  . TONSILLECTOMY AND ADENOIDECTOMY       OB History   None      Home Medications    Prior to Admission medications   Medication Sig Start Date End Date Taking? Authorizing Provider  aspirin EC 81 MG tablet Take 81 mg by  mouth daily.    Yes [provider]  BIOTIN PO Take 5,000 mcg by mouth daily.    Yes [provider]  bisacodyl (DULCOLAX) 5 MG EC tablet Take 5 mg by mouth daily as needed for moderate constipation.    Yes [provider]  cholecalciferol (VITAMIN D) 1000 units tablet Take 1,000 Units by mouth daily.   Yes [provider]  lisinopril (PRINIVIL,ZESTRIL) 2.5 MG tablet Take 1 tablet (2.5 mg total) by mouth daily. NEED OV. 06/28/17  Yes Hilty, Nadean Corwin, MD  loratadine (CLARITIN) 10 MG  tablet Take 1 tablet (10 mg total) by mouth daily. 02/22/16  Yes Burns, Claudina Lick, MD  metFORMIN (GLUCOPHAGE) 1000 MG tablet Take 1 tablet (1,000 mg total) by mouth 2 (two) times daily with a meal. 04/17/17  Yes Burns, Claudina Lick, MD  ranitidine (ZANTAC) 150 MG tablet Take 1 tablet (150 mg total) by mouth 2 (two) times daily. 11/12/17  Yes Burns, Claudina Lick, MD  rosuvastatin (CRESTOR) 40 MG tablet Take 1 tablet (40 mg total) by mouth daily. 04/17/17  Yes Burns, Claudina Lick, MD  sertraline (ZOLOFT) 25 MG tablet Take 1 tablet (25 mg total) by mouth daily. 11/12/17  Yes Burns, Claudina Lick, MD  vitamin B-12 (CYANOCOBALAMIN) 1000 MCG tablet Take 1,000 mcg by mouth daily.   Yes [provider]  glucose blood (ONE TOUCH ULTRA TEST) test strip Check blood sugar daily as directed, DX 250.02 07/24/13   Hendricks Limes, MD  [facility] DELICA LANCETS FINE MISC 1 each by Does not apply route as directed. Check blood sugar daily as directed, DX 250.02 HOLD UNTIL PATIENT REQUEST 09/09/12   Hendricks Limes, MD    Family History Family History  Problem Relation Age of Onset  . Heart attack Mother 60  . Diabetes Mother   . Stroke Mother   . Hypertension Mother   . Goiter Mother   . Heart attack Father        >55  . Bladder Cancer Father   . Parkinson's disease Maternal Aunt   . Osteoporosis Sister   . Diabetes Brother     Social History Social History   Tobacco Use  . Smoking status: Never Smoker  . Smokeless tobacco: Never Used  Substance Use Topics  . Alcohol use: Yes    Comment: Occasionally   . Drug use: No     Allergies   Patient has no known allergies.   Review of Systems Review of Systems  Cardiovascular: Positive for syncope.   All other systems reviewed and are negative except that which was mentioned in HPI   Physical Exam Updated Vital Signs BP 129/67 (BP Location: Left Arm)   Pulse 78   Temp 97.9 F (36.6 C) (Oral)   Resp 15   Ht 5\' 2"  (1.575 m)   Wt 63.5 kg (140 lb)   SpO2  99%   BMI 25.61 kg/m   Physical Exam  Constitutional: She is oriented to person, place, and time. She appears well-developed and well-nourished. No distress.  HENT:  Head: Normocephalic and atraumatic.  Moist mucous membranes  Eyes: Pupils are equal, round, and reactive to light. Conjunctivae are normal.  Neck: Neck supple.  Cardiovascular: Normal rate, regular rhythm and normal heart sounds.  No murmur heard. Pulmonary/Chest: Effort normal and breath sounds normal.  Abdominal: Soft. Bowel sounds are normal. She exhibits no distension. There is no tenderness.  Musculoskeletal: Normal range of motion. She exhibits no edema.  Tenderness of  midline upper lumbar spine without stepoff or ecchymosis  Neurological: She is alert and oriented to person, place, and time. She displays normal reflexes. No cranial nerve deficit or sensory deficit. She exhibits normal muscle tone.  Fluent speech  Skin: Skin is warm and dry.  Psychiatric: She has a normal mood and affect. Judgment normal.  Nursing note and vitals reviewed.    ED Treatments / Results  Labs (all labs ordered are listed, but only abnormal results are displayed) Labs Reviewed  BASIC METABOLIC PANEL - Abnormal; Notable for the following components:      Result Value   CO2 18 (*)    Glucose, Bld 215 (*)    BUN 29 (*)    Creatinine, Ser 1.08 (*)    GFR calc non Af Amer 51 (*)    GFR calc Af Amer 59 (*)    All other components within normal limits  CBC - Abnormal; Notable for the following components:   RBC 3.16 (*)    Hemoglobin 9.1 (*)    HCT 27.9 (*)    All other components within normal limits  URINALYSIS, ROUTINE W REFLEX MICROSCOPIC - Abnormal; Notable for the following components:   APPearance CLOUDY (*)    Glucose, UA 50 (*)    Hgb urine dipstick SMALL (*)    Ketones, ur 5 (*)    Protein, ur 100 (*)    Leukocytes, UA TRACE (*)    Bacteria, UA RARE (*)    All other components within normal limits  CBG MONITORING,  ED - Abnormal; Notable for the following components:   Glucose-Capillary 192 (*)    All other components within normal limits  LACTIC ACID, PLASMA  LACTIC ACID, PLASMA  BASIC METABOLIC PANEL  CBC  HIV ANTIBODY (ROUTINE TESTING)  TROPONIN I  TROPONIN I  TROPONIN I  CBG MONITORING, ED    EKG EKG Interpretation  Date/Time:  Tuesday November 20 2017 16:55:37 EDT Ventricular Rate:  79 PR Interval:    QRS Duration: 101 QT Interval:  386 QTC Calculation: 443 R Axis:   43 Text Interpretation:  Sinus rhythm Low voltage, precordial leads since previous tracing, rate slower Confirmed by Theotis Burrow [phone]) on 11/20/2017 5:29:08 PM   Radiology X-ray Chest Pa And Lateral  Result Date: 11/21/2017 CLINICAL DATA:  Syncope. EXAM: CHEST - 2 VIEW COMPARISON:  01/12/2014 FINDINGS: Borderline heart size accentuated by low volumes. Status post CABG. Mild streaky density at the bases. No Kerley lines, effusion, or pneumothorax. Known acute L1 compression fracture. IMPRESSION: Mild atelectasis. Electronically Signed   By: Monte Fantasia M.D.   On: 11/21/2017 00:09   Dg Thoracic Spine 2 View  Result Date: 11/20/2017 CLINICAL DATA:  Pain after fall.  Mid to low back pain. EXAM: THORACIC SPINE 2 VIEWS COMPARISON:  None. FINDINGS: Limited views of the chest are normal.  Sternotomy wires are intact. No inter pedicular widening is identified. No traumatic malalignment identified. There is anterior wedging of L1 with approximately 15 or 20 % loss of anterior height. This was not present in 2015 but is otherwise age indeterminate. No other fractures are seen. IMPRESSION: 1. Anterior wedging of L1 with approximately 15 or 20% loss of anterior height. This finding was not present 2015. It is otherwise age indeterminate. However, an acute compression fracture is not excluded on this study. MRI could better assess acuity. Electronically Signed   By: Dorise Bullion III M.D   On: 11/20/2017 19:30   Dg Lumbar Spine 2-3  Views  Result Date: 11/20/2017 CLINICAL DATA:  Pain after fall. EXAM: LUMBAR SPINE - 2-3 VIEW COMPARISON:  None. FINDINGS: No inter pedicular widening or traumatic malalignment. Anterior wedging of L1 with approximately 15 or 20% loss of anterior height. This was not present 2015 but is otherwise age indeterminate. No other fractures are seen. Mild multilevel degenerative disc disease. IMPRESSION: Anterior wedging of L1 with 15 or 20% loss of anterior height. This was not present in 2015 but is otherwise age indeterminate. An acute compression fracture is not excluded. MRI could better assess acuity as clinically warranted. Electronically Signed   By: Dorise Bullion III M.D   On: 11/20/2017 19:31   Ct Head Wo Contrast  Result Date: 11/20/2017 CLINICAL DATA:  Syncopal episode today with a fall and blow to the back of the head. Initial encounter. EXAM: CT HEAD WITHOUT CONTRAST CT CERVICAL SPINE WITHOUT CONTRAST TECHNIQUE: Multidetector CT imaging of the head and cervical spine was performed following the standard protocol without intravenous contrast. Multiplanar CT image reconstructions of the cervical spine were also generated. COMPARISON:  None. FINDINGS: CT HEAD FINDINGS Brain: No evidence of acute infarction, hemorrhage, hydrocephalus, extra-axial collection or mass lesion/mass effect. Atrophy is noted. Vascular: Atherosclerosis is seen. Skull: Intact.  No focal lesion. Sinuses/Orbits: Mucous retention cyst or polyp left maxillary sinus noted. Other: None. CT CERVICAL SPINE FINDINGS Alignment: Normal. Skull base and vertebrae: No acute fracture. No primary bone lesion or focal pathologic process. Soft tissues and spinal canal: No prevertebral fluid or swelling. No visible canal hematoma. Disc levels: Loss of disc space height and endplate spurring are seen at C5-6. Upper chest: Lung apices are clear. Other: None. IMPRESSION: No acute abnormality head or cervical spine. Cortical atrophy. Atherosclerosis. C5-6  degenerative disc disease. Electronically Signed   By: Inge Rise M.D.   On: 11/20/2017 18:58   Ct Cervical Spine Wo Contrast  Result Date: 11/20/2017 CLINICAL DATA:  Syncopal episode today with a fall and blow to the back of the head. Initial encounter. EXAM: CT HEAD WITHOUT CONTRAST CT CERVICAL SPINE WITHOUT CONTRAST TECHNIQUE: Multidetector CT imaging of the head and cervical spine was performed following the standard protocol without intravenous contrast. Multiplanar CT image reconstructions of the cervical spine were also generated. COMPARISON:  None. FINDINGS: CT HEAD FINDINGS Brain: No evidence of acute infarction, hemorrhage, hydrocephalus, extra-axial collection or mass lesion/mass effect. Atrophy is noted. Vascular: Atherosclerosis is seen. Skull: Intact.  No focal lesion. Sinuses/Orbits: Mucous retention cyst or polyp left maxillary sinus noted. Other: None. CT CERVICAL SPINE FINDINGS Alignment: Normal. Skull base and vertebrae: No acute fracture. No primary bone lesion or focal pathologic process. Soft tissues and spinal canal: No prevertebral fluid or swelling. No visible canal hematoma. Disc levels: Loss of disc space height and endplate spurring are seen at C5-6. Upper chest: Lung apices are clear. Other: None. IMPRESSION: No acute abnormality head or cervical spine. Cortical atrophy. Atherosclerosis. C5-6 degenerative disc disease. Electronically Signed   By: Inge Rise M.D.   On: 11/20/2017 18:58   Ct Lumbar Spine Wo Contrast  Result Date: 11/20/2017 CLINICAL DATA:  Low back pain since a fall due to a syncopal episode earlier today. Initial encounter. EXAM: CT LUMBAR SPINE WITHOUT CONTRAST TECHNIQUE: Multidetector CT imaging of the lumbar spine was performed without intravenous contrast administration. Multiplanar CT image reconstructions were also generated. COMPARISON:  Plain films lumbar spine earlier today. FINDINGS: Segmentation: Standard. Alignment: Maintained. Vertebrae: The  patient has an L1 superior endplate compression fracture with  vertebral body height loss anteriorly of approximately 35%. Fracture margins are sharp consistent with an acute injury. No involvement of the posterior elements is identified. No other fracture is seen. Paraspinal and other soft tissues: Atherosclerotic vascular disease is noted. No acute abnormality. Disc levels: T11-12: Negative. T12-L1: Bony retropulsion off the superior endplate of L1 is more prominent to the left. There is mild central canal narrowing and narrowing the left lateral recess which could impact the descending left L1 root. L1-2: A left lateral recess protrusion causes mild appearing narrowing in the left lateral recess and could potentially impact the descending left L2 root. The foramina are open. L2-3: Negative. L3-4: Negative. L4-5: There is a shallow disc bulge and some ligamentum flavum thickening. Mild central canal narrowing is seen. The foramina are open. L5-S1: Shallow disc bulge without stenosis. IMPRESSION: Acute superior endplate compression fracture of L1 with approximately 35% vertebral body height loss. Bony retropulsion into the central canal causes mild central canal narrowing and narrowing in the left lateral recess which could impact the descending left L1 root. Left lateral recess protrusion at L1-2 could potentially irritate the descending left L2 root. Atherosclerosis. Electronically Signed   By: Inge Rise M.D.   On: 11/20/2017 20:58    Procedures Procedures (including critical care time)  Medications Ordered in ED Medications  aspirin EC tablet 81 mg (has no administration in time range)  bisacodyl (DULCOLAX) EC tablet 5 mg (has no administration in time range)  cholecalciferol (VITAMIN D) tablet 1,000 Units (has no administration in time range)  rosuvastatin (CRESTOR) tablet 40 mg (has no administration in time range)  sertraline (ZOLOFT) tablet 25 mg (has no administration in time range)    vitamin B-12 (CYANOCOBALAMIN) tablet 1,000 mcg (has no administration in time range)  famotidine (PEPCID) tablet 20 mg (20 mg Oral Given 11/21/17 0037)  loratadine (CLARITIN) tablet 10 mg (has no administration in time range)  insulin aspart (novoLOG) injection 0-15 Units (has no administration in time range)  sodium chloride flush (NS) 0.9 % injection 3 mL (3 mLs Intravenous Given 11/21/17 0042)  enoxaparin (LOVENOX) injection 40 mg (40 mg Subcutaneous Given 11/21/17 0038)  oxyCODONE-acetaminophen (PERCOCET/ROXICET) 5-325 MG per tablet 1 tablet (has no administration in time range)  sodium chloride 0.9 % bolus 500 mL (0 mLs Intravenous Stopped 11/20/17 1943)  fentaNYL (SUBLIMAZE) injection 50 mcg (50 mcg Intravenous Given 11/20/17 1828)  oxyCODONE-acetaminophen (PERCOCET/ROXICET) 5-325 MG per tablet 1 tablet (1 tablet Oral Given 11/20/17 2011)     Initial Impression / Assessment and Plan / ED Course  I have reviewed the triage vital signs and the nursing notes.  Pertinent labs & imaging results that were available during my care of the patient were reviewed by me and considered in my medical decision making (see chart for details).     Uncomfortable but otherwise well-appearing on exam with reassuring vital signs.  EKG without acute ischemic changes.  Neuro exam was normal.  Normal distal strength and sensation of legs.  CT head and C-spine negative acute.  Plain films of lumbar spine show L1 fracture, obtain CT for better characterization.  CT shows L1 superior endplate compression fracture with 35% loss of height with bony retropulsion, mild canal narrowing.  I discussed findings with neurosurgeon on-call, Maudie Mercury, who reviewed images and advised to place patient in TLSO brace, have her follow-up in 2 weeks in the clinic.  I discussed this plan with the patient.  She was more comfortable after receiving above pain medications.  Regarding syncope, I am concerned about syncope with no prodrome and no  obvious provoking factors in patient with cardiac disease.  Recommended admission.  Discussed with Dr. Alcario Drought and patient admitted for further care.  Final Clinical Impressions(s) / ED Diagnoses   Final diagnoses:  Low back pain    ED Discharge Orders    None       Caelin Rayl, Wenda Overland, MD 11/21/17 0107

## 2017-11-21 NOTE — Discharge Summary (Addendum)
Physician Discharge Summary  Kathryn Logan FTD:[id number] DOB: 1945/09/09 DOA: 11/20/2017  PCP: Binnie Rail, MD  Admit date: 11/20/2017 Discharge date: 11/23/2017  Admitted From: Home Disposition: Home   Recommendations for Outpatient Follow-up:  1. Follow up with cardiology for cardiac monitoring. Office to call patient once precertification is completed.  2. Follow up with neurosurgery in 2 weeks for L1 fracture. 3. Follow up with PCP on 7/16 with repeat BMP to monitor renal function. Recommended to hold metformin and lisinopril until improvement in renal function.  Home Health: None (outpatient PT) Equipment/Devices: TLSO brace Discharge Condition: Stable CODE STATUS: Full Diet recommendation: Heart healthy  Brief/Interim Summary: Kathryn Logan is a 72 year old female with past medical history including CAD status post CABG, type 2 diabetes mellitus, hypertension, hyperlipidemia who presented with syncope and back pain. Just prior to arrival, the patient states she was at her house and sleeping on the couch when her husband came home with groceries at the front door.  She got up and walked to the door and told him that she was going to help him with the groceries.  The next thing she knew, she was on the floor.  Husband states that she lost consciousness rapidly, fell to the floor striking head and back on the wall and returned to normal alertness/awareness and mentation quickly. She was down for an unknown amount of time. The husband initially reported 5 minutes, but isn't sure. No seizurelike activity noted. No h/o seizures or syncope. She's had no recent anginal or CHF symptoms or illnesses. Denies presyncopal symptoms.   Evaluation in the ED revealed acute L1 compression fractures for which neurosurgery was consulted and recommended TLSO brace and outpatient follow up. She was observed on telemetry which has shown no arrhythmias, maintain NSR. Echocardiogram was performed, showing  improvement in cardiac function with preserved EF.  A TLSO race has been applied and PT evaluation completed. Her functional mobility has improved dramatically while hospitalized and will be referred to outpatient physical therapy. She will be given oxycodone for acute pain as needed which has controlled pain while in the hospital. Cardiology has been consulted, will pursue precertification and contact patient for outpatient ambulatory cardiac monitoring.   She experienced nausea and vomiting, self-limited, and decreased po intake while admitted and subsequently developed hypotension which improved with fluids. This was associated with an acute kidney injury which has shown improvement. The patient is now eating and drinking normally.   Discharge Diagnoses:  Principal Problem:   Syncope Active Problems:   Type 2 diabetes mellitus with vascular disease ([place])   Compression fracture of L1 lumbar vertebra, closed, initial encounter ([place])  Acute kidney injury: Cr has continued to trend upward. Most likely prerenal with ongoing poor po intake, possibly also hemodynamically mediated with hypotension. - FENa 1.7% with granular casts on UA without hematuria or pyuria, most consistent with hemodynamically mediated renal injury. Fortunately this is improving and will need follow up.  - Holding lisinopril as below.   Syncope: Orthostatic (with + vital signs and baseline soft BPs, had gotten up from sleeping on couch abruptly prior to sxs) and/or arrhythmogenic (quick on/quick off) vs. seizure (prolonged time down but no stereotyped activity or h/o Sz's and no postictal state reported). Prolonged time down could be due to concussion? - Continue telemetry, no arrhythmias while admitted - Discussed with cardiology who will begin certification for outpatient cardiac monitoring.  - Reminded patient that per [place] law, unexplained syncope requires further work up prior to  driving. Gave other precautions as well.    Traumatic L1 compression fracture: Reviewed CT scan with family today.  - Continue percocet, will give 5 days Rx at discharge. Pt has PCP follow up 7/16.  - TLSO, f/u with neurosurgery as outpatient. No bending, twisting, lifting. - PT/OT to continue as outpatient  Nausea and vomiting: Due to pain and orthostatic symptoms.  - Antiemetics prn  Constipation:  - Restart pt's home medications and give senna while taking opioid. Abd exam benign.  HTN:  - Hold lisinopril. Outpatient records show hypotension limiting medications.   Hyperlipidemia: Continue crestor  Discharge Instructions Discharge Instructions    Ambulatory referral to Physical Therapy   Complete by:  As directed    Diet - low sodium heart healthy   Complete by:  As directed    Discharge instructions   Complete by:  As directed    - Continue wearing the TLSO brace when moving around. Avoid bending, lifting or twisting motions.  - Take oxycodone as needed for pain and when you take this, also take senna to prevent constipation. You may also take zofran as needed for nausea.  - Make sure you eat and drink enough to stay well hydrated. You can drink G2 to help.  - Do not take your lisinopril or metformin until you follow up with your doctor next week. These can cause problems with your kidneys, so kidney function will need to be rechecked before restarting them.  - Follow up with cardiology (they will call you) to arrange cardiac monitoring.  - Call to schedule follow up with neurosurgery ([facility], information below) in about 2 weeks. - If your pain worsens or you have another episode of syncope or you are unable to drink enough fluids, seek medical attention right away.   Increase activity slowly   Complete by:  As directed      Allergies as of 11/23/2017   No Known Allergies     Medication List    STOP taking these medications   lisinopril 2.5 MG tablet Commonly known as:  PRINIVIL,ZESTRIL   metFORMIN  1000 MG tablet Commonly known as:  GLUCOPHAGE     TAKE these medications   aspirin EC 81 MG tablet Take 81 mg by mouth daily.   BIOTIN PO Take 5,000 mcg by mouth daily.   bisacodyl 5 MG EC tablet Commonly known as:  DULCOLAX Take 5 mg by mouth daily as needed for moderate constipation.   cholecalciferol 1000 units tablet Commonly known as:  VITAMIN D Take 1,000 Units by mouth daily.   glucose blood test strip Commonly known as:  ONE TOUCH ULTRA TEST Check blood sugar daily as directed, DX 250.02   loratadine 10 MG tablet Commonly known as:  CLARITIN Take 1 tablet (10 mg total) by mouth daily.   ondansetron 4 MG disintegrating tablet Commonly known as:  ZOFRAN-ODT Take 1 tablet (4 mg total) by mouth every 8 (eight) hours as needed for nausea or vomiting.   ONETOUCH DELICA LANCETS FINE Misc 1 each by Does not apply route as directed. Check blood sugar daily as directed, DX 250.02 HOLD UNTIL PATIENT REQUEST   oxyCODONE-acetaminophen 5-325 MG tablet Commonly known as:  PERCOCET/ROXICET Take 1 tablet by mouth every 8 (eight) hours as needed for up to 5 days for moderate pain or severe pain.   ranitidine 150 MG tablet Commonly known as:  ZANTAC Take 1 tablet (150 mg total) by mouth 2 (two) times daily.   rosuvastatin 40 MG  tablet Commonly known as:  CRESTOR Take 1 tablet (40 mg total) by mouth daily.   senna-docusate 8.6-50 MG tablet Commonly known as:  Senokot-S Take 1 tablet by mouth at bedtime.   sertraline 25 MG tablet Commonly known as:  ZOLOFT Take 1 tablet (25 mg total) by mouth daily.   vitamin B-12 1000 MCG tablet Commonly known as:  CYANOCOBALAMIN Take 1,000 mcg by mouth daily.      Follow-up Information    Binnie Rail, MD Follow up on 11/27/2017.   Specialty:  Internal Medicine Contact information: [place] [address] [phone]        Ashok Pall, MD. Schedule an appointment as soon as possible for a visit in 2  week(s).   Specialty:  Neurosurgery Contact information: [address] [place] [address] [phone]          No Known Allergies  Consultations:  None  Procedures/Studies: X-ray Chest Pa And Lateral  Result Date: 11/21/2017 CLINICAL DATA:  Syncope. EXAM: CHEST - 2 VIEW COMPARISON:  01/12/2014 FINDINGS: Borderline heart size accentuated by low volumes. Status post CABG. Mild streaky density at the bases. No Kerley lines, effusion, or pneumothorax. Known acute L1 compression fracture. IMPRESSION: Mild atelectasis. Electronically Signed   By: Monte Fantasia M.D.   On: 11/21/2017 00:09   Dg Thoracic Spine 2 View  Result Date: 11/20/2017 CLINICAL DATA:  Pain after fall.  Mid to low back pain. EXAM: THORACIC SPINE 2 VIEWS COMPARISON:  None. FINDINGS: Limited views of the chest are normal.  Sternotomy wires are intact. No inter pedicular widening is identified. No traumatic malalignment identified. There is anterior wedging of L1 with approximately 15 or 20 % loss of anterior height. This was not present in 2015 but is otherwise age indeterminate. No other fractures are seen. IMPRESSION: 1. Anterior wedging of L1 with approximately 15 or 20% loss of anterior height. This finding was not present 2015. It is otherwise age indeterminate. However, an acute compression fracture is not excluded on this study. MRI could better assess acuity. Electronically Signed   By: Dorise Bullion III M.D   On: 11/20/2017 19:30   Dg Lumbar Spine 2-3 Views  Result Date: 11/20/2017 CLINICAL DATA:  Pain after fall. EXAM: LUMBAR SPINE - 2-3 VIEW COMPARISON:  None. FINDINGS: No inter pedicular widening or traumatic malalignment. Anterior wedging of L1 with approximately 15 or 20% loss of anterior height. This was not present 2015 but is otherwise age indeterminate. No other fractures are seen. Mild multilevel degenerative disc disease. IMPRESSION: Anterior wedging of L1 with 15 or 20% loss of  anterior height. This was not present in 2015 but is otherwise age indeterminate. An acute compression fracture is not excluded. MRI could better assess acuity as clinically warranted. Electronically Signed   By: Dorise Bullion III M.D   On: 11/20/2017 19:31   Ct Head Wo Contrast  Result Date: 11/20/2017 CLINICAL DATA:  Syncopal episode today with a fall and blow to the back of the head. Initial encounter. EXAM: CT HEAD WITHOUT CONTRAST CT CERVICAL SPINE WITHOUT CONTRAST TECHNIQUE: Multidetector CT imaging of the head and cervical spine was performed following the standard protocol without intravenous contrast. Multiplanar CT image reconstructions of the cervical spine were also generated. COMPARISON:  None. FINDINGS: CT HEAD FINDINGS Brain: No evidence of acute infarction, hemorrhage, hydrocephalus, extra-axial collection or mass lesion/mass effect. Atrophy is noted. Vascular: Atherosclerosis is seen. Skull: Intact.  No focal lesion. Sinuses/Orbits: Mucous  retention cyst or polyp left maxillary sinus noted. Other: None. CT CERVICAL SPINE FINDINGS Alignment: Normal. Skull base and vertebrae: No acute fracture. No primary bone lesion or focal pathologic process. Soft tissues and spinal canal: No prevertebral fluid or swelling. No visible canal hematoma. Disc levels: Loss of disc space height and endplate spurring are seen at C5-6. Upper chest: Lung apices are clear. Other: None. IMPRESSION: No acute abnormality head or cervical spine. Cortical atrophy. Atherosclerosis. C5-6 degenerative disc disease. Electronically Signed   By: Inge Rise M.D.   On: 11/20/2017 18:58   Ct Cervical Spine Wo Contrast  Result Date: 11/20/2017 CLINICAL DATA:  Syncopal episode today with a fall and blow to the back of the head. Initial encounter. EXAM: CT HEAD WITHOUT CONTRAST CT CERVICAL SPINE WITHOUT CONTRAST TECHNIQUE: Multidetector CT imaging of the head and cervical spine was performed following the standard protocol  without intravenous contrast. Multiplanar CT image reconstructions of the cervical spine were also generated. COMPARISON:  None. FINDINGS: CT HEAD FINDINGS Brain: No evidence of acute infarction, hemorrhage, hydrocephalus, extra-axial collection or mass lesion/mass effect. Atrophy is noted. Vascular: Atherosclerosis is seen. Skull: Intact.  No focal lesion. Sinuses/Orbits: Mucous retention cyst or polyp left maxillary sinus noted. Other: None. CT CERVICAL SPINE FINDINGS Alignment: Normal. Skull base and vertebrae: No acute fracture. No primary bone lesion or focal pathologic process. Soft tissues and spinal canal: No prevertebral fluid or swelling. No visible canal hematoma. Disc levels: Loss of disc space height and endplate spurring are seen at C5-6. Upper chest: Lung apices are clear. Other: None. IMPRESSION: No acute abnormality head or cervical spine. Cortical atrophy. Atherosclerosis. C5-6 degenerative disc disease. Electronically Signed   By: Inge Rise M.D.   On: 11/20/2017 18:58   Ct Lumbar Spine Wo Contrast  Result Date: 11/20/2017 CLINICAL DATA:  Low back pain since a fall due to a syncopal episode earlier today. Initial encounter. EXAM: CT LUMBAR SPINE WITHOUT CONTRAST TECHNIQUE: Multidetector CT imaging of the lumbar spine was performed without intravenous contrast administration. Multiplanar CT image reconstructions were also generated. COMPARISON:  Plain films lumbar spine earlier today. FINDINGS: Segmentation: Standard. Alignment: Maintained. Vertebrae: The patient has an L1 superior endplate compression fracture with vertebral body height loss anteriorly of approximately 35%. Fracture margins are sharp consistent with an acute injury. No involvement of the posterior elements is identified. No other fracture is seen. Paraspinal and other soft tissues: Atherosclerotic vascular disease is noted. No acute abnormality. Disc levels: T11-12: Negative. T12-L1: Bony retropulsion off the superior  endplate of L1 is more prominent to the left. There is mild central canal narrowing and narrowing the left lateral recess which could impact the descending left L1 root. L1-2: A left lateral recess protrusion causes mild appearing narrowing in the left lateral recess and could potentially impact the descending left L2 root. The foramina are open. L2-3: Negative. L3-4: Negative. L4-5: There is a shallow disc bulge and some ligamentum flavum thickening. Mild central canal narrowing is seen. The foramina are open. L5-S1: Shallow disc bulge without stenosis. IMPRESSION: Acute superior endplate compression fracture of L1 with approximately 35% vertebral body height loss. Bony retropulsion into the central canal causes mild central canal narrowing and narrowing in the left lateral recess which could impact the descending left L1 root. Left lateral recess protrusion at L1-2 could potentially irritate the descending left L2 root. Atherosclerosis. Electronically Signed   By: Inge Rise M.D.   On: 11/20/2017 20:58   US Renal  Result Date: 11/22/2017  CLINICAL DATA:  Acute kidney injury for 2 days EXAM: RENAL / URINARY TRACT ULTRASOUND COMPLETE COMPARISON:  None. FINDINGS: Right Kidney: Length: 11 cm. Echogenicity within normal limits. No solid mass or hydronephrosis visualized. 14 mm right renal cyst. Left Kidney: Length: 11 cm. Echogenicity within normal limits. On a few images there appears to be cortical thinning at the lower pole, but this is not confirmed on lumbar spine CT from 2 days ago. No mass or hydronephrosis visualized. Bladder: Appears normal for degree of bladder distention. IMPRESSION: 1. No hydronephrosis or other acute finding. 2. Small right renal cyst. Electronically Signed   By: Monte Fantasia M.D.   On: 11/22/2017 20:50    ECHOCARDIOGRAM 11/21/2017: - Left ventricle: The cavity size was normal. Systolic function was   normal. The estimated ejection fraction was in the range of 50%   to  55%. Hypokinesis of the anteroseptal and inferoseptal   myocardium; unchanged from the previous study. Left ventricular   diastolic function parameters were normal. - Ventricular septum: Septal motion showed &quot;bounce&quot;. The contour   showed mild systolic flattening. These changes are consistent   with a post-thoracotomy state. - Right ventricle: The cavity size was mildly dilated. Wall   thickness was normal. Systolic function was mildly reduced.  Impressions: - Post-CABG septal motion, otherwise normal EF.  Subjective: Pain is controlled in the back but significant with movement. Worked on walking and stairs with PT daily including this morning. No nausea or vomiting in past 24 hours, eating normally. No swelling or dyspnea.   Discharge Exam: Vitals:   11/22/17 2036 11/23/17 0516  BP: 110/65 140/72  Pulse: 79 85  Resp:  13  Temp: 98 F (36.7 C) 98.3 F (36.8 C)  SpO2: 96% 97%   General: Pt is alert, awake, not in acute distress Cardiovascular: RRR, S1/S2 +, no rubs, no gallops Respiratory: CTA bilaterally, no wheezing, no rhonchi Abdominal: Soft, NT, ND, bowel sounds + MSK: Point tenderness to thoracic and lumbar spine without palpable step off. Paraspinal musculature slightly tender without spasm.  Extremities: No edema, no cyanosis  Labs: BNP (last 3 results) No results for input(s): BNP in the last 8760 hours. Basic Metabolic Panel: Recent Labs  Lab 11/20/17 1700 11/21/17 0733 11/22/17 0325 11/23/17 0449  NA 139 139 139 141  K 3.7 4.0 4.5 3.6  CL 110 108 110 115*  CO2 18* 19* 19* 18*  GLUCOSE 215* 140* 220* 151*  BUN 29* 28* 27* 22  CREATININE 1.08* 1.22* 1.59* 1.40*  CALCIUM 9.1 9.3 9.1 8.8*   Liver Function Tests: No results for input(s): AST, ALT, ALKPHOS, BILITOT, PROT, ALBUMIN in the last 168 hours. No results for input(s): LIPASE, AMYLASE in the last 168 hours. No results for input(s): AMMONIA in the last 168 hours. CBC: Recent Labs  Lab  11/20/17 1700 11/21/17 0733  WBC 10.5 8.9  HGB 9.1* 9.7*  HCT 27.9* 29.0*  MCV 88.3 86.6  PLT 222 196   Cardiac Enzymes: Recent Labs  Lab 11/21/17 0117 11/21/17 0733  TROPONINI <0.03 <0.03   BNP: Invalid input(s): POCBNP CBG: Recent Labs  Lab 11/22/17 1117 11/22/17 1608 11/22/17 2143 11/23/17 0645 11/23/17 1122  GLUCAP 224* 312* 132* 141* 151*   D-Dimer No results for input(s): DDIMER in the last 72 hours. Hgb A1c No results for input(s): HGBA1C in the last 72 hours. Lipid Profile No results for input(s): CHOL, HDL, LDLCALC, TRIG, CHOLHDL, LDLDIRECT in the last 72 hours. Thyroid function studies No results for  input(s): TSH, T4TOTAL, T3FREE, THYROIDAB in the last 72 hours.  Invalid input(s): FREET3 Anemia work up No results for input(s): VITAMINB12, FOLATE, FERRITIN, TIBC, IRON, RETICCTPCT in the last 72 hours. Urinalysis    Component Value Date/Time   COLORURINE STRAW (A) 11/22/2017 2221   APPEARANCEUR HAZY (A) 11/22/2017 2221   LABSPEC 1.008 11/22/2017 2221   PHURINE 6.0 11/22/2017 2221   GLUCOSEU >=500 (A) 11/22/2017 2221   HGBUR SMALL (A) 11/22/2017 2221   BILIRUBINUR NEGATIVE 11/22/2017 [place] 11/22/2017 2221   PROTEINUR 30 (A) 11/22/2017 2221   UROBILINOGEN 1.0 11/30/2013 1021   NITRITE NEGATIVE 11/22/2017 2221   LEUKOCYTESUR NEGATIVE 11/22/2017 2221    Microbiology No results found for this or any previous visit (from the past 240 hour(s)).  Time coordinating discharge: Approximately 40 minutes  Patrecia Pour, MD  [facility] 11/23/2017, 12:31 PM Pager [phone]

## 2017-11-21 NOTE — Progress Notes (Signed)
OT Cancellation Note  Patient Details Name: Kathryn Logan MRN: [id number] DOB: 10-17-1945   Cancelled Treatment:    Reason Eval/Treat Not Completed: Medical issues which prohibited therapy(Nausated and hypotensive ).  Patient supine in bed, patient became nauseated and vomited prior to therapist entering room; nursing reporting hypotensive and waiting on medication.  Will OT evaluation attempt as schedule allows.   Delight Stare, OTR/L  Pager [place] 11/21/2017, 3:43 PM

## 2017-11-22 ENCOUNTER — Inpatient Hospital Stay (HOSPITAL_COMMUNITY): Payer: Medicare Other

## 2017-11-22 DIAGNOSIS — Z820 Family history of epilepsy and other diseases of the nervous system: Secondary | ICD-10-CM | POA: Diagnosis not present

## 2017-11-22 DIAGNOSIS — I2510 Atherosclerotic heart disease of native coronary artery without angina pectoris: Secondary | ICD-10-CM | POA: Diagnosis present

## 2017-11-22 DIAGNOSIS — Z8052 Family history of malignant neoplasm of bladder: Secondary | ICD-10-CM | POA: Diagnosis not present

## 2017-11-22 DIAGNOSIS — I10 Essential (primary) hypertension: Secondary | ICD-10-CM | POA: Diagnosis present

## 2017-11-22 DIAGNOSIS — Z8262 Family history of osteoporosis: Secondary | ICD-10-CM | POA: Diagnosis not present

## 2017-11-22 DIAGNOSIS — Z9089 Acquired absence of other organs: Secondary | ICD-10-CM | POA: Diagnosis not present

## 2017-11-22 DIAGNOSIS — Z7982 Long term (current) use of aspirin: Secondary | ICD-10-CM | POA: Diagnosis not present

## 2017-11-22 DIAGNOSIS — N179 Acute kidney failure, unspecified: Secondary | ICD-10-CM | POA: Diagnosis not present

## 2017-11-22 DIAGNOSIS — E1159 Type 2 diabetes mellitus with other circulatory complications: Secondary | ICD-10-CM | POA: Diagnosis not present

## 2017-11-22 DIAGNOSIS — I951 Orthostatic hypotension: Secondary | ICD-10-CM | POA: Diagnosis present

## 2017-11-22 DIAGNOSIS — Z951 Presence of aortocoronary bypass graft: Secondary | ICD-10-CM | POA: Diagnosis not present

## 2017-11-22 DIAGNOSIS — Y92009 Unspecified place in unspecified non-institutional (private) residence as the place of occurrence of the external cause: Secondary | ICD-10-CM | POA: Diagnosis not present

## 2017-11-22 DIAGNOSIS — E785 Hyperlipidemia, unspecified: Secondary | ICD-10-CM | POA: Diagnosis present

## 2017-11-22 DIAGNOSIS — Z833 Family history of diabetes mellitus: Secondary | ICD-10-CM | POA: Diagnosis not present

## 2017-11-22 DIAGNOSIS — E559 Vitamin D deficiency, unspecified: Secondary | ICD-10-CM | POA: Diagnosis present

## 2017-11-22 DIAGNOSIS — Z79899 Other long term (current) drug therapy: Secondary | ICD-10-CM | POA: Diagnosis not present

## 2017-11-22 DIAGNOSIS — Z8249 Family history of ischemic heart disease and other diseases of the circulatory system: Secondary | ICD-10-CM | POA: Diagnosis not present

## 2017-11-22 DIAGNOSIS — M8580 Other specified disorders of bone density and structure, unspecified site: Secondary | ICD-10-CM | POA: Diagnosis present

## 2017-11-22 DIAGNOSIS — K219 Gastro-esophageal reflux disease without esophagitis: Secondary | ICD-10-CM | POA: Diagnosis present

## 2017-11-22 DIAGNOSIS — Z8349 Family history of other endocrine, nutritional and metabolic diseases: Secondary | ICD-10-CM | POA: Diagnosis not present

## 2017-11-22 DIAGNOSIS — Z823 Family history of stroke: Secondary | ICD-10-CM | POA: Diagnosis not present

## 2017-11-22 DIAGNOSIS — S32010A Wedge compression fracture of first lumbar vertebra, initial encounter for closed fracture: Secondary | ICD-10-CM | POA: Diagnosis present

## 2017-11-22 DIAGNOSIS — F419 Anxiety disorder, unspecified: Secondary | ICD-10-CM | POA: Diagnosis present

## 2017-11-22 DIAGNOSIS — W1839XA Other fall on same level, initial encounter: Secondary | ICD-10-CM | POA: Diagnosis present

## 2017-11-22 DIAGNOSIS — Z7984 Long term (current) use of oral hypoglycemic drugs: Secondary | ICD-10-CM | POA: Diagnosis not present

## 2017-11-22 DIAGNOSIS — R55 Syncope and collapse: Secondary | ICD-10-CM | POA: Diagnosis not present

## 2017-11-22 DIAGNOSIS — K5900 Constipation, unspecified: Secondary | ICD-10-CM | POA: Diagnosis present

## 2017-11-22 LAB — BASIC METABOLIC PANEL
Anion gap: 10 (ref 5–15)
BUN: 27 mg/dL — ABNORMAL HIGH (ref 8–23)
CHLORIDE: 110 mmol/L (ref 98–111)
CO2: 19 mmol/L — AB (ref 22–32)
Calcium: 9.1 mg/dL (ref 8.9–10.3)
Creatinine, Ser: 1.59 mg/dL — ABNORMAL HIGH (ref 0.44–1.00)
GFR, EST AFRICAN AMERICAN: 37 mL/min — AB (ref >60)
GFR, EST NON AFRICAN AMERICAN: 32 mL/min — AB (ref >60)
Glucose, Bld: 220 mg/dL — ABNORMAL HIGH (ref 70–99)
POTASSIUM: 4.5 mmol/L (ref 3.5–5.1)
Sodium: 139 mmol/L (ref 135–145)

## 2017-11-22 LAB — SODIUM, URINE, RANDOM: Sodium, Ur: 83 mmol/L

## 2017-11-22 LAB — GLUCOSE, CAPILLARY
GLUCOSE-CAPILLARY: 224 mg/dL — AB (ref 70–99)
GLUCOSE-CAPILLARY: 312 mg/dL — AB (ref 70–99)
GLUCOSE-CAPILLARY: 132 mg/dL — AB (ref 70–99)
Glucose-Capillary: 170 mg/dL — ABNORMAL HIGH (ref 70–99)

## 2017-11-22 LAB — URINALYSIS, ROUTINE W REFLEX MICROSCOPIC
Bilirubin Urine: NEGATIVE
Glucose, UA: >=500 mg/dL — AB
Ketones, ur: NEGATIVE mg/dL
Leukocytes, UA: NEGATIVE
Nitrite: NEGATIVE
PH: 6 (ref 5.0–8.0)
Protein, ur: 30 mg/dL — AB
SPECIFIC GRAVITY, URINE: 1.008 (ref 1.005–1.030)

## 2017-11-22 LAB — PROTEIN / CREATININE RATIO, URINE
Creatinine, Urine: 52.39 mg/dL
PROTEIN CREATININE RATIO: 1.37 mg/mg{creat} — AB (ref 0.00–0.15)
Total Protein, Urine: 72 mg/dL

## 2017-11-22 MED ORDER — FAMOTIDINE 20 MG PO TABS
20.0000 mg | ORAL_TABLET | Freq: Every day | ORAL | Status: DC
  Administered 2017-11-23: 20 mg via ORAL
  Filled 2017-11-22: qty 1

## 2017-11-22 MED ORDER — SENNOSIDES-DOCUSATE SODIUM 8.6-50 MG PO TABS
1.0000 | ORAL_TABLET | Freq: Every day | ORAL | Status: DC
  Administered 2017-11-22: 1 via ORAL
  Filled 2017-11-22: qty 1

## 2017-11-22 MED ORDER — SODIUM CHLORIDE 0.9 % IV SOLN
INTRAVENOUS | Status: AC
  Administered 2017-11-22 – 2017-11-23 (×2): via INTRAVENOUS

## 2017-11-22 MED ORDER — BISACODYL 5 MG PO TBEC
5.0000 mg | DELAYED_RELEASE_TABLET | Freq: Every day | ORAL | Status: DC
  Administered 2017-11-22 – 2017-11-23 (×2): 5 mg via ORAL
  Filled 2017-11-22 (×2): qty 1

## 2017-11-22 MED ORDER — BISACODYL 10 MG RE SUPP
10.0000 mg | Freq: Every day | RECTAL | Status: DC | PRN
  Administered 2017-11-23: 10 mg via RECTAL
  Filled 2017-11-22: qty 1

## 2017-11-22 MED ORDER — ENOXAPARIN SODIUM 30 MG/0.3ML ~~LOC~~ SOLN
30.0000 mg | SUBCUTANEOUS | Status: DC
  Administered 2017-11-23: 30 mg via SUBCUTANEOUS
  Filled 2017-11-22: qty 0.3

## 2017-11-22 NOTE — Progress Notes (Signed)
[facility] PROGRESS NOTE  Kathryn Logan  GTX:[id number] DOB: February 16, 1946 DOA: 11/20/2017 PCP: Binnie Rail, MD  Brief Narrative: Kathryn Logan is a 72 year old female with past medical history including CAD status post CABG, type 2 diabetes mellitus, hypertension, hyperlipidemia who presented with syncope and back pain.Just prior to arrival, the patient states she was at her house and sleeping on the couch when her husband came home with groceries at the front door. She got up and walked to the door and told him that she was going to help him with the groceries. The next thing she knew, she was on the floor. Husband states that she lost consciousness rapidly, fell to the floor striking head and back on the wall and returned to normal alertness/awareness and mentation quickly. She was down for an unknown amount of time. The husband initially reported 5 minutes, but isn't sure. No seizurelike activity noted. No h/o seizures or syncope. She's had no recent anginal or CHF symptoms or illnesses. Denies presyncopal symptoms.   Evaluation in the ED revealed acute L1 compression fractures for which neurosurgery was consulted and recommended TLSO brace and outpatient follow up. Echocardiogram showed improved cardiac function from prior. Telemetry has shown no significant arrhythmias. TLSO brace applied and both PT and OT have worked with the patient. She has very limited assistance at home since her husband has had a stroke. She had some emesis on 7/10 and 7/11 with rising creatinine. Work up for AKI was ordered, IV fluids continued.  Subjective: Has had poor po intake including vomiting yesterday. Feels this is improved as of lunch today. Denies recurrent syncope, no chest pain, dyspnea, palpitations. Pain is controlled with oxycodone.   Objective: BP 118/63 (BP Location: Right Arm)   Pulse 96   Temp (!) 97.2 F (36.2 C) (Oral)   Resp 14   Ht 5\' 2"  (1.575 m)   Wt 62.6 kg (138 lb 0.5 oz)    SpO2 95%   BMI 25.25 kg/m  Gen: Tired-appearing 72 y.o.female in NAD HEENT: Tacky mucous membranes, posterior oropharynx clear Pulm: Non-labored; CTAB, no wheezes  CV: Regular rate, no murmur appreciated; distal pulses intact/symmetric GI: + BS; soft, non-tender, non-distended Skin: No rashes, wounds, ulcers MSK: Midline tenderness over thoracolumbar spine greatest at L1.  Neuro: A&Ox3, CN II-XII without deficits  Echocardiogram 11/21/2017: - Left ventricle: The cavity size was normal. Systolic function was   normal. The estimated ejection fraction was in the range of 50%   to 55%. Hypokinesis of the anteroseptal and inferoseptal   myocardium; unchanged from the previous study. Left ventricular   diastolic function parameters were normal. - Ventricular septum: Septal motion showed &quot;bounce&quot;. The contour   showed mild systolic flattening. These changes are consistent   with a post-thoracotomy state. - Right ventricle: The cavity size was mildly dilated. Wall   thickness was normal. Systolic function was mildly reduced.  Impressions: - Post-CABG septal motion, otherwise normal EF.  Assessment & Plan: Acute kidney injury: Cr has continued to trend upward. Most likely prerenal with ongoing poor po intake, possibly also hemodynamically mediated with hypotension. Some proteinuria noted on UA.  - UPr:Cr, calculate FENa, repeat UA - Renal U/S - Continue trial IV fluids, does not appear overloaded and has preserved EF.  - Recheck BMP in AM - Holding lisinopril as below.   Syncope: Orthostatic (with + vital signs and baseline soft BPs, had gotten up from sleeping on couch abruptly prior to sxs) and/or arrhythmogenic (quick on/quick off)  vs. seizure (prolonged time down but no stereotyped activity or h/o Sz's and no postictal state reported). Prolonged time down could be due to concussion? - Continue telemetry, no arrhythmias on my personal review today. - Discussed with  cardiology who will begin certification for outpatient cardiac monitoring.   Traumatic L1 compression fracture: Reviewed CT scan with family today.  - Continue percocet, will give 5 days Rx at discharge. Pt has PCP follow up 7/16.  - TLSO, f/u with neurosurgery as outpatient. No bending, twisting, lifting. - PT/OT to continue while admitted.  - Started Vit D  Nausea and vomiting: Due to pain and orthostatic symptoms.  - Antiemetics prn  Constipation:  - Restart pt's dulcolax scheduled, will schedule senna which can be held if needed and ordered suppository if above measures are ineffective. Abd exam benign.  HTN:  - Hold lisinopril. Outpatient records show hypotension limiting medications.   Hyperlipidemia: Continue crestor  Disposition: Anticipate DC home once renal function shows stability and work up for AKI complete. Pt also has limited assistance at home (husband is s/p CVA) and continues to struggle with fully independent mobility.   Patrecia Pour, MD [facility] [email or web address] Password [facility] 11/22/2017, 2:29 PM

## 2017-11-22 NOTE — Progress Notes (Signed)
Inpatient Diabetes Program Recommendations  AACE/ADA: New Consensus Statement on Inpatient Glycemic Control (2015)  Target Ranges:  Prepandial:   less than 140 mg/dL      Peak postprandial:   less than 180 mg/dL (1-2 hours)      Critically ill patients:  140 - 180 mg/dL   Results for Kathryn Logan, Kathryn Logan (MRN [id number]) as of 11/22/2017 10:17  Ref. Range 11/21/2017 07:46 11/21/2017 11:32 11/21/2017 16:18 11/21/2017 21:10 11/22/2017 06:33  Glucose-Capillary Latest Ref Range: 70 - 99 mg/dL 134 (H) 224 (H) 274 (H) 230 (H) 170 (H)   Review of Glycemic Control  Diabetes history: DM 2 Outpatient Diabetes medications: Metformin 1000 mg BID Current orders for Inpatient glycemic control: Novolog 0-15 units tid  Inpatient Diabetes Program Recommendations:    Glucose increases with meals. Please consider Novolog 3 units tid meal coverage if patient consumes at least 50% of meals.  Thanks,  Tama Headings RN, MSN, BC-ADM Inpatient Diabetes Coordinator Team Pager [phone] (8a-5p)

## 2017-11-22 NOTE — Progress Notes (Signed)
Physical Therapy Progress Note  Assessment: Pt progressing well with mobility. Overall mobilizing at a supervision to mod I level at this time. Continues to require some assist with brace application, however will have husband available at home if needed. Pt was educated on stair negotiation, car transfer, precautions, activity progression, and brace application/wearing schedule.    11/22/17 0850  PT Visit Information  Last PT Received On 11/22/17  Assistance Needed +1  History of Present Illness Pt is a 72 y/o female who presents s/p syncopal episode at home during which she hit the back of her head and was unresponsive for approximately 5 minutes. Imaging revealed L1 superior endplate compression fracture with 35% loss in vertebral body height. Per neurosurgery, TLSO ordered. PMH significant for osteopenia, NSTEMI s/p CABG, DM, CAD.  Subjective Data  Patient Stated Goal Home today  Precautions  Precautions Fall;Back  Precaution Booklet Issued Yes (comment)  Precaution Comments Pt was cued for precautions during functional mobility  Required Braces or Orthoses Spinal Brace  Spinal Brace TLSO;Applied in sitting position  Restrictions  Weight Bearing Restrictions No  Pain Assessment  Pain Assessment 0-10  Pain Score 3  Pain Location back  Pain Descriptors / Indicators Discomfort  Pain Intervention(s) Monitored during session  Cognition  Arousal/Alertness Awake/alert  Behavior During Therapy WFL for tasks assessed/performed  Overall Cognitive Status Within Functional Limits for tasks assessed  Bed Mobility  Overal bed mobility Modified Independent  Bed Mobility Rolling;Sidelying to Sit  General bed mobility comments Min use of rails however feel she could manage without rails if needed. No assist to transition to EOB.   Transfers  Overall transfer level Needs assistance  Equipment used Rolling walker (2 wheeled)  Transfers Sit to/from Stand  Sit to Stand Supervision  General  transfer comment VC's for hand placement on seated surface for safety. No assist required.   Ambulation/Gait  Ambulation/Gait assistance Supervision  Gait Distance (Feet) 275 Feet  Assistive device Rolling walker (2 wheeled)  Gait Pattern/deviations Step-through pattern;Decreased stride length  General Gait Details Pt ambulating well without LOB. Pt reports mild discomfort but little pain.   Gait velocity Decreased  Gait velocity interpretation 1.31 - 2.62 ft/sec, indicative of limited community ambulator  Stairs Yes  Stairs assistance Min guard  Stair Management One rail Left;Step to pattern;Alternating pattern;Forwards  Number of Stairs 10  General stair comments VC's for sequencing and general safety. Pt managed well without unsteadiness or reports of increased pain.   Balance  Overall balance assessment Needs assistance  Sitting-balance support Feet supported;No upper extremity supported  Sitting balance-Leahy Scale Fair  Standing balance support Bilateral upper extremity supported  Standing balance-Leahy Scale Poor  Standing balance comment Reliant on UE support at this time.   PT - End of Session  Equipment Utilized During Treatment Gait belt;Back brace  Activity Tolerance Patient tolerated treatment well  Patient left in chair;with call bell/phone within reach;with family/visitor present  Nurse Communication Mobility status   PT - Assessment/Plan  PT Plan Current plan remains appropriate  PT Visit Diagnosis Pain;Other abnormalities of gait and mobility (R26.89)  Pain - part of body  (back)  PT Frequency (ACUTE ONLY) Min 4X/week  Follow Up Recommendations Outpatient PT;Supervision for mobility/OOB  PT equipment None recommended by PT  AM-PAC PT "6 Clicks" Daily Activity Outcome Measure  Difficulty turning over in bed (including adjusting bedclothes, sheets and blankets)? 4  Difficulty moving from lying on back to sitting on the side of the bed?  4  Difficulty  sitting down  on and standing up from a chair with arms (e.g., wheelchair, bedside commode, etc,.)? 4  Help needed moving to and from a bed to chair (including a wheelchair)? 3  Help needed walking in hospital room? 3  Help needed climbing 3-5 steps with a railing?  3  6 Click Score 21  Mobility G Code  CJ  PT Goal Progression  Progress towards PT goals Progressing toward goals  Acute Rehab PT Goals  PT Goal Formulation With patient/family  Time For Goal Achievement 11/28/17  Potential to Achieve Goals Good  PT Time Calculation  PT Start Time (ACUTE ONLY) 1100  PT Stop Time (ACUTE ONLY) 0847  PT Time Calculation (min) (ACUTE ONLY) 23 min  PT General Charges  $$ ACUTE PT VISIT 1 Visit  PT Treatments  $Gait Training 23-37 mins   Rolinda Roan, PT, DPT Acute Rehabilitation Services Pager: [phone]

## 2017-11-22 NOTE — Evaluation (Signed)
Occupational Therapy Evaluation Patient Details Name: Kathryn Logan MRN: [id number] DOB: 11/02/1945 Today's Date: 11/22/2017    History of Present Illness Pt is a 72 y/o female who presents s/p syncopal episode at home during which she hit the back of her head and was unresponsive for approximately 5 minutes. Imaging revealed L1 superior endplate compression fracture with 35% loss in vertebral body height. Per neurosurgery, TLSO ordered. Became nauseated and hypotensive 11/21/17. PMH significant for osteopenia, NSTEMI s/p CABG, DM, CAD.   Clinical Impression   PTA patient reports independent with ADL, IADL and mobility.  She currently completes UB ADL with supervision, LB ADL with minimal assist, toileting with minimal assist, toilet transfer with min guard assist, and bed mobility with supervision.  She has been educated on precautions, safety, compensatory techniques and AE for increased participation in ADLs, transfers, DME and recommendations.  Reports she will use 3:1 over commode at home and tub transfer bench in tub, declines practicing tub transfer but reports has used previously with spouse and knows how to use it (verbally educated on technique with brace and precautions).  Patient requires cueing to recall precautions but able to adhere during self care tasks without cueing.  No follow up OT recommended, as patient will have 24/7 assistance from spouse at dc and patient demonstrates good awareness and recall of techniques educated on.  Will continue to follow while admitted.      Follow Up Recommendations  No OT follow up;Supervision/Assistance - 24 hour    Equipment Recommendations  None recommended by OT    Recommendations for Other Services       Precautions / Restrictions Precautions Precautions: Fall;Back Precaution Booklet Issued: No(issued in prior PT session) Precaution Comments: reviewed handout in details, able to recall precautions only after cueing Required Braces  or Orthoses: Spinal Brace Spinal Brace: Thoracolumbosacral orthotic;Applied in sitting position Restrictions Weight Bearing Restrictions: No      Mobility Bed Mobility Overal bed mobility: Needs Assistance Bed Mobility: Sit to Sidelying;Rolling Rolling: Supervision       Sit to sidelying: Supervision;HOB elevated General bed mobility comments: requires cueing for log rolling technique, declines simulated home setup and HOB elevated as reports using pillows at home  Transfers Overall transfer level: Needs assistance Equipment used: Rolling walker (2 wheeled) Transfers: Sit to/from Stand Sit to Stand: Supervision         General transfer comment: VC's for hand placement on seated surface for safety. No assist required.     Balance Overall balance assessment: Needs assistance Sitting-balance support: Feet supported;No upper extremity supported Sitting balance-Leahy Scale: Fair     Standing balance support: Bilateral upper extremity supported Standing balance-Leahy Scale: Poor Standing balance comment: Reliant on UE support at this time.                            ADL either performed or assessed with clinical judgement   ADL Overall ADL's : Needs assistance/impaired Eating/Feeding: Supervision/ safety;Sitting   Grooming: Supervision/safety;Cueing for compensatory techniques;Cueing for safety;Standing Grooming Details (indicate cue type and reason): reviewed compensatory techniques  Upper Body Bathing: Set up;Sitting;Cueing for compensatory techniques   Lower Body Bathing: Minimal assistance;Sitting/lateral leans;Cueing for back precautions;Cueing for compensatory techniques;With adaptive equipment Lower Body Bathing Details (indicate cue type and reason): requires long sponge (has at home), reviewed compensatory techniques for back precautions, edu safety and completion seated with lateral leans Upper Body Dressing : Set up;Sitting Upper Body Dressing  Details (indicate  cue type and reason): requires assist with brace Lower Body Dressing: Minimal assistance;Sit to/from stand;Cueing for back precautions;Cueing for compensatory techniques;With adaptive equipment Lower Body Dressing Details (indicate cue type and reason): edu on compensatory techniques, safety and use of AE  Toilet Transfer: Min guard;Ambulation;RW;Comfort height toilet Toilet Transfer Details (indicate cue type and reason): cueing for hand placement and safety  Toileting- Clothing Manipulation and Hygiene: Minimal assistance;Sit to/from stand Toileting - Clothing Manipulation Details (indicate cue type and reason): reviewed Glass blower/designer Details (indicate cue type and reason): verbally reviewed, has tub bench and reports using with her husband; no questions or conerns  Functional mobility during ADLs: Supervision/safety;Rolling walker       Vision   Vision Assessment?: No apparent visual deficits     Perception     Praxis      Pertinent Vitals/Pain Pain Assessment: Faces Pain Score: 3  Faces Pain Scale: Hurts little more Pain Location: back Pain Descriptors / Indicators: Discomfort Pain Intervention(s): Monitored during session;Repositioned     Hand Dominance Right   Extremity/Trunk Assessment Upper Extremity Assessment Upper Extremity Assessment: Generalized weakness   Lower Extremity Assessment Lower Extremity Assessment: Defer to PT evaluation   Cervical / Trunk Assessment Cervical / Trunk Assessment: Other exceptions Cervical / Trunk Exceptions: L1 compression fracture   Communication Communication Communication: No difficulties   Cognition Arousal/Alertness: Awake/alert Behavior During Therapy: WFL for tasks assessed/performed Overall Cognitive Status: Impaired/Different from baseline Area of Impairment: Memory                     Memory: Decreased recall of precautions         General Comments:  limited recall of precautions during session   General Comments       Exercises     Shoulder Instructions      Home Living Family/patient expects to be discharged to:: Private residence Living Arrangements: Spouse/significant other Available Help at Discharge: Family Type of Home: House Home Access: Stairs to enter Technical brewer of Steps: 7   Home Layout: One level     Bathroom Shower/Tub: Teacher, early years/pre: Standard Bathroom Accessibility: Yes   Home Equipment: Tub bench;Cane - single point;Bedside commode;Walker - 2 wheels          Prior Functioning/Environment Level of Independence: Independent        Comments: reports independent with ADL, IADL and mobility        OT Problem List: Decreased activity tolerance;Decreased strength;Impaired balance (sitting and/or standing);Decreased safety awareness;Decreased knowledge of use of DME or AE;Decreased knowledge of precautions;Pain      OT Treatment/Interventions: Self-care/ADL training;Energy conservation;DME and/or AE instruction;Therapeutic activities;Patient/family education;Balance training    OT Goals(Current goals can be found in the care plan section) Acute Rehab OT Goals Patient Stated Goal: Home today OT Goal Formulation: With patient Time For Goal Achievement: 12/06/17 Potential to Achieve Goals: Good  OT Frequency: Min 2X/week   Barriers to D/C:            Co-evaluation              AM-PAC PT "6 Clicks" Daily Activity     Outcome Measure Help from another person eating meals?: None Help from another person taking care of personal grooming?: A Little Help from another person toileting, which includes using toliet, bedpan, or urinal?: A Little Help from another person bathing (including washing, rinsing, drying)?: A Little Help from another person to put on and taking  off regular upper body clothing?: None Help from another person to put on and taking off regular  lower body clothing?: A Little 6 Click Score: 20   End of Session Equipment Utilized During Treatment: Rolling walker;Back brace Nurse Communication: Mobility status  Activity Tolerance: Patient tolerated treatment well;Patient limited by fatigue Patient left: in bed;with call bell/phone within reach;with bed alarm set  OT Visit Diagnosis: Other abnormalities of gait and mobility (R26.89);Muscle weakness (generalized) (M62.81);Pain Pain - part of body: (back)                Time: 5284-1324 OT Time Calculation (min): 31 min Charges:  OT General Charges $OT Visit: 1 Visit OT Evaluation $OT Eval Moderate Complexity: 1 Mod OT Treatments $Self Care/Home Management : 8-22 mins G-Codes:     Delight Stare, OTR/L  Pager [phone]   Delight Stare 11/22/2017, 10:34 AM

## 2017-11-23 LAB — BASIC METABOLIC PANEL
ANION GAP: 8 (ref 5–15)
BUN: 22 mg/dL (ref 8–23)
CALCIUM: 8.8 mg/dL — AB (ref 8.9–10.3)
CO2: 18 mmol/L — ABNORMAL LOW (ref 22–32)
Chloride: 115 mmol/L — ABNORMAL HIGH (ref 98–111)
Creatinine, Ser: 1.4 mg/dL — ABNORMAL HIGH (ref 0.44–1.00)
GFR calc Af Amer: 43 mL/min — ABNORMAL LOW (ref >60)
GFR, EST NON AFRICAN AMERICAN: 37 mL/min — AB (ref >60)
Glucose, Bld: 151 mg/dL — ABNORMAL HIGH (ref 70–99)
POTASSIUM: 3.6 mmol/L (ref 3.5–5.1)
SODIUM: 141 mmol/L (ref 135–145)

## 2017-11-23 LAB — GLUCOSE, CAPILLARY
Glucose-Capillary: 141 mg/dL — ABNORMAL HIGH (ref 70–99)
Glucose-Capillary: 151 mg/dL — ABNORMAL HIGH (ref 70–99)

## 2017-11-23 MED ORDER — SENNOSIDES-DOCUSATE SODIUM 8.6-50 MG PO TABS: 1.0000 | ORAL_TABLET | Freq: Every day | ORAL | 0 refills | Status: DC

## 2017-11-23 MED ORDER — ONDANSETRON 4 MG PO TBDP: 4.0000 mg | ORAL_TABLET | Freq: Three times a day (TID) | ORAL | 0 refills | Status: DC | PRN

## 2017-11-23 MED ORDER — SORBITOL 70 % SOLN
30.0000 mL | Freq: Once | Status: AC
  Administered 2017-11-23: 30 mL via ORAL
  Filled 2017-11-23: qty 30

## 2017-11-23 MED ORDER — OXYCODONE-ACETAMINOPHEN 5-325 MG PO TABS: 1.0000 | ORAL_TABLET | Freq: Three times a day (TID) | ORAL | 0 refills | Status: DC | PRN

## 2017-11-23 NOTE — Plan of Care (Signed)
  Problem: Education: Goal: Knowledge of General Education information will improve Outcome: Progressing   Problem: Clinical Measurements: Goal: Ability to maintain clinical measurements within normal limits will improve Outcome: Progressing   Problem: Activity: Goal: Risk for activity intolerance will decrease Outcome: Progressing   Problem: Elimination: Goal: Will not experience complications related to bowel motility Outcome: Progressing   Problem: Pain Managment: Goal: General experience of comfort will improve Outcome: Progressing   Problem: Safety: Goal: Ability to remain free from injury will improve Outcome: Progressing   

## 2017-11-23 NOTE — Progress Notes (Signed)
Patient discharging home today. Discharge instructions explained to patient and she verbalized understanding. No c/o pain or discomfort upon discharge. Took all personal belongings. No further questions or concerns voiced.

## 2017-11-23 NOTE — Care Management Note (Addendum)
Case Management Note  Patient Details  Name: Kathryn Logan MRN: [id number] Date of Birth: [birth year]  Subjective/Objective:                    Action/Plan: Pt discharging home with self care. CM consulted for outpatient therapy. Patient would like to attend [facility]. Orders in Epic and information on the AVS.  Pt has hospital f/u and transportation home.   Addendum: pt requesting a walker. Jermaine with [facility] notified and will deliver to the room.  Expected Discharge Date:  11/23/17               Expected Discharge Plan:  OP Rehab  In-House Referral:     Discharge planning Services  CM Consult  Post Acute Care Choice:    Choice offered to:     DME Arranged:    DME Agency:     HH Arranged:    HH Agency:     Status of Service:  Completed, signed off  If discussed at [employer] of Stay Meetings, dates discussed:    Additional Comments:  Pollie Friar, RN 11/23/2017, 2:21 PM

## 2017-11-23 NOTE — Discharge Instructions (Signed)
Because you had an episode of syncope, you should not drive, climb ladders, or go swimming or perform any other task that would cause undue endangerment to yourself or others were you to have another episode. If no more episodes in 6 months or a cardiac cause is ruled out, you may drive per state law.

## 2017-11-23 NOTE — Progress Notes (Signed)
Physical Therapy Treatment Patient Details Name: Kathryn Logan MRN: [id number] DOB: [birth year] Today's Date: 11/23/2017    History of Present Illness Pt is a 72 y/o female who presents s/p syncopal episode at home during which she hit the back of her head and was unresponsive for approximately 5 minutes. Imaging revealed L1 superior endplate compression fracture with 35% loss in vertebral body height. Per neurosurgery, TLSO ordered. Became nauseated and hypotensive 11/21/17. PMH significant for osteopenia, NSTEMI s/p CABG, DM, CAD.    PT Comments    Pt progressing well towards physical therapy goals. Requires occasional cues for maintenance of precautions during mobility, however is not able to verbally recall 3/3 back precautions. Pt educated on brace application/wearing schedule, car transfer, and safe activity progression. Overall mobilizing at a supervision to mod I level. Will continue to follow and progress as able per POC.    Follow Up Recommendations  Outpatient PT;Supervision for mobility/OOB     Equipment Recommendations  None recommended by PT    Recommendations for Other Services       Precautions / Restrictions Precautions Precautions: Fall;Back Precaution Booklet Issued: Yes (comment)(issued in prior PT session) Precaution Comments: Pt able to recall 1/3 precautions without cues. Pt educated on 3/3 precautions.  Required Braces or Orthoses: Spinal Brace Spinal Brace: Thoracolumbosacral orthotic;Applied in sitting position Restrictions Weight Bearing Restrictions: No    Mobility  Bed Mobility Overal bed mobility: Modified Independent Bed Mobility: Sit to Sidelying;Rolling;Sidelying to Sit           General bed mobility comments: requires cueing for proper log rolling technique. Able to complete without assist.   Transfers Overall transfer level: Needs assistance Equipment used: Rolling walker (2 wheeled) Transfers: Sit to/from Stand Sit to Stand:  Supervision         General transfer comment: VC's for hand placement on seated surface for safety. No assist required.   Ambulation/Gait Ambulation/Gait assistance: Supervision Gait Distance (Feet): 300 Feet Assistive device: Rolling walker (2 wheeled) Gait Pattern/deviations: Step-through pattern;Decreased stride length Gait velocity: Decreased Gait velocity interpretation: 1.31 - 2.62 ft/sec, indicative of limited community ambulator General Gait Details: Pt ambulating well without LOB. Pt reports mild discomfort but little pain.    Stairs Stairs: Yes Stairs assistance: Supervision Stair Management: Forwards;Two rails;Alternating pattern Number of Stairs: 5 General stair comments: Pt demonstrated proper sequencing and safety.    Wheelchair Mobility    Modified Rankin (Stroke Patients Only)       Balance Overall balance assessment: Needs assistance Sitting-balance support: Feet supported;No upper extremity supported Sitting balance-Leahy Scale: Fair     Standing balance support: Bilateral upper extremity supported Standing balance-Leahy Scale: Fair Standing balance comment: UE support for comfort.                             Cognition Arousal/Alertness: Awake/alert Behavior During Therapy: WFL for tasks assessed/performed Overall Cognitive Status: Impaired/Different from baseline Area of Impairment: Memory                     Memory: Decreased recall of precautions         General Comments: limited recall of precautions during session      Exercises      General Comments        Pertinent Vitals/Pain Pain Assessment: Faces Faces Pain Scale: Hurts a little bit Pain Location: back Pain Descriptors / Indicators: Discomfort Pain Intervention(s): Monitored during session    Home  Living                      Prior Function            PT Goals (current goals can now be found in the care plan section) Acute Rehab PT  Goals Patient Stated Goal: Home today PT Goal Formulation: With patient/family Time For Goal Achievement: 11/28/17 Potential to Achieve Goals: Good Progress towards PT goals: Progressing toward goals    Frequency    Min 4X/week      PT Plan Current plan remains appropriate    Co-evaluation              AM-PAC PT "6 Clicks" Daily Activity  Outcome Measure  Difficulty turning over in bed (including adjusting bedclothes, sheets and blankets)?: None Difficulty moving from lying on back to sitting on the side of the bed? : None Difficulty sitting down on and standing up from a chair with arms (e.g., wheelchair, bedside commode, etc,.)?: None Help needed moving to and from a bed to chair (including a wheelchair)?: A Little Help needed walking in hospital room?: A Little Help needed climbing 3-5 steps with a railing? : A Little 6 Click Score: 21    End of Session Equipment Utilized During Treatment: Gait belt;Back brace Activity Tolerance: Patient tolerated treatment well Patient left: in bed;with call bell/phone within reach Nurse Communication: Mobility status PT Visit Diagnosis: Pain;Other abnormalities of gait and mobility (R26.89) Pain - part of body: (back)     Time: 2863-8177 PT Time Calculation (min) (ACUTE ONLY): 30 min  Charges:  $Gait Training: 23-37 mins                    G Codes:       Rolinda Roan, PT, DPT Acute Rehabilitation Services Pager: [place] 11/23/2017, 11:17 AM

## 2017-11-23 NOTE — Progress Notes (Signed)
Occupational Therapy Treatment Patient Details Name: Kathryn Logan MRN: [id number] DOB: 1945/11/08 Today's Date: 11/23/2017    History of present illness Pt is a 72 y/o female who presents s/p syncopal episode at home during which she hit the back of her head and was unresponsive for approximately 5 minutes. Imaging revealed L1 superior endplate compression fracture with 35% loss in vertebral body height. Per neurosurgery, TLSO ordered. Became nauseated and hypotensive 11/21/17. PMH significant for osteopenia, NSTEMI s/p CABG, DM, CAD.   OT comments  Patient progressing well with ADL and mobility.  She continues to require min cueing to recall and verbalized back precautions, but during functional activities able to adhere to without cueing.  She demonstrates ability to complete toileting and toilet transfers with supervision, as well as tub transfer using tub transfer bench and bed mobility (transferring onto high bed, per having high beds at home) with min guard assist for safety. Patient able to verbalize and demonstrate techniques for bathing (brace management, position- seated, and figure 4 technique to wash feet) with supervision.  Continue to recommend 24/7 assistance at discharge, but no OT follow up recommended at this time.  Will continue to follow while admitted.       Follow Up Recommendations  No OT follow up;Supervision/Assistance - 24 hour    Equipment Recommendations  None recommended by OT    Recommendations for Other Services      Precautions / Restrictions Precautions Precautions: Fall;Back Precaution Booklet Issued: Yes (comment)(issued in prior PT session) Precaution Comments: Able to recall 2/3 precautions without cueing.  Reviewed precautions with patient  Required Braces or Orthoses: Spinal Brace Spinal Brace: Thoracolumbosacral orthotic;Applied in sitting position Restrictions Weight Bearing Restrictions: No       Mobility Bed Mobility Overal bed mobility:  Needs Assistance Bed Mobility: Sidelying to Sit;Sit to Sidelying;Rolling Rolling: Supervision Sidelying to sit: Supervision     Sit to sidelying: Supervision General bed mobility comments: supervision for safety, verbal cueing for proper log rolling technique  Transfers Overall transfer level: Needs assistance Equipment used: Rolling walker (2 wheeled) Transfers: Sit to/from Stand Sit to Stand: Supervision         General transfer comment: verbal cueing for hand placement and safety     Balance Overall balance assessment: Needs assistance Sitting-balance support: Feet supported;No upper extremity supported Sitting balance-Leahy Scale: Fair     Standing balance support: Bilateral upper extremity supported Standing balance-Leahy Scale: Fair Standing balance comment: UE support for comfort.                            ADL either performed or assessed with clinical judgement   ADL Overall ADL's : Needs assistance/impaired     Grooming: Standing;Supervision/safety;Wash/dry Nurse, mental health Details (indicate cue type and reason): for safety                 Toilet Transfer: Supervision/safety;RW;Ambulation;Comfort height toilet Toilet Transfer Details (indicate cue type and reason): demonstrates good safety and technique Toileting- Clothing Manipulation and Hygiene: Supervision/safety;Sit to/from stand   Tub/ Shower Transfer: Tub transfer;Min guard;Tub bench;Ambulation;Rolling walker;Adhering to back precautions Tub/Shower Transfer Details (indicate cue type and reason): min cueing to recall technique, min guard for safety, good adherance to back precautions  Functional mobility during ADLs: Supervision/safety;Rolling walker General ADL Comments: requires increased time for all activites     Vision       Perception     Praxis      Cognition Arousal/Alertness:  Awake/alert Behavior During Therapy: WFL for tasks assessed/performed Overall Cognitive  Status: Impaired/Different from baseline Area of Impairment: Memory                     Memory: Decreased recall of precautions         General Comments: limited recall of precautions during session        Exercises     Shoulder Instructions       General Comments spouse present at end of session, limited engagment physically    Pertinent Vitals/ Pain       Pain Assessment: 0-10 Pain Score: 4  Faces Pain Scale: Hurts a little bit Pain Location: back Pain Descriptors / Indicators: Discomfort Pain Intervention(s): Monitored during session;Repositioned  Home Living                                          Prior Functioning/Environment              Frequency  Min 2X/week        Progress Toward Goals  OT Goals(current goals can now be found in the care plan section)  Progress towards OT goals: Progressing toward goals  Acute Rehab OT Goals Patient Stated Goal: Home today OT Goal Formulation: With patient Time For Goal Achievement: 12/06/17 Potential to Achieve Goals: Good  Plan Discharge plan remains appropriate;Frequency remains appropriate    Co-evaluation                 AM-PAC PT "6 Clicks" Daily Activity     Outcome Measure   Help from another person eating meals?: None Help from another person taking care of personal grooming?: A Little Help from another person toileting, which includes using toliet, bedpan, or urinal?: A Little Help from another person bathing (including washing, rinsing, drying)?: A Little Help from another person to put on and taking off regular upper body clothing?: None Help from another person to put on and taking off regular lower body clothing?: A Little 6 Click Score: 20    End of Session Equipment Utilized During Treatment: Rolling walker;Back brace  OT Visit Diagnosis: Other abnormalities of gait and mobility (R26.89);Muscle weakness (generalized) (M62.81);Pain Pain - part of body:  (back)   Activity Tolerance Patient tolerated treatment well   Patient Left in bed;with call bell/phone within reach;with family/visitor present   Nurse Communication Mobility status;Patient requests pain meds        Time: 5003-7048 OT Time Calculation (min): 30 min  Charges: OT General Charges $OT Visit: 1 Visit OT Treatments $Self Care/Home Management : 23-37 mins  Delight Stare, OTR/L  Pager [place] 11/23/2017, 12:28 PM

## 2017-11-25 NOTE — Patient Instructions (Addendum)
Start taking miralax 1-2 times a day as needed.    Test(s) ordered today. Your results will be released to [facility] (or called to you) after review, usually within 72hours after test completion. If any changes need to be made, you will be notified at that same time.   Medications reviewed and updated.  No changes recommended at this time.  Your prescription(s) have been submitted to your pharmacy. Please take as directed and contact our office if you believe you are having problem(s) with the medication(s).  Please followup in 3 months

## 2017-11-25 NOTE — Progress Notes (Signed)
Subjective:    Patient ID: Kathryn Logan, female    DOB: 05/01/1946, 72 y.o.   MRN: [id number]  HPI The patient is here for follow up.  Hospital 7/9-7/12.  She went to the emergency room after falling at home.  She was on the couch and got up to go to the front door to help her husband with the groceries in the next thing she knew she was on the floor.  Husband states she did have loss of consciousness and this hit the back of her head and back on the wall.  She regained consciousness quickly.  There is no seizure-like activity.  She denied any prodromal symptoms.  Imaging in the ED showed an L1 compression fracture.  Neurosurgery was consulted and recommended a TLSO brace and outpatient follow-up.  She had no arrhythmias on telemetry.  Echocardiogram was done and showed improved cardiac function, no concerns.  PT evaluated patient.  She was referred for outpatient physical therapy.  She was given a prescription for oxycodone.  Cardiology was consulted and she will have outpatient cardiac monitoring.  She did have some intermittent nausea and vomiting while in the hospital that resulted in decreased oral intake and hypotension that improved with fluids.  This was associated with acute kidney injury that did improve, but needs to be followed up.  Acute kidney injury: Thought to be prerenal.  Improved with fluids.  Lisinopril held.  She is drinking a lot of fluids.  Metformin held.  Syncope: She was mildly orthostatic.  No evidence of seizure-like activity.  No obvious arrhythmia.  No concerning findings on telemetry.  Cardiology will do outpatient cardiac monitoring.  She was advised not to drive until work-up completed.  Traumatic L1 compression fracture: Related to fall at home.  Percocet as needed for pain.  She is taking it as prescribed three times a day.  TLSO brace per neurosurgery.  Follow-up with neurosurgery as an outpatient.  PT/OT as an outpatient.  Nausea and vomiting: thought to  be related to pain and orthostatic symptoms.  She feels it was related to not being able to take the zantac twice daily prior to meals.  She has been taking her zantac at home and denies any nausea.      CAD: lisinopril held secondary to acute kidney injury and she was also hypotensive initially.  Hyperlipidemia: She is taking her medication daily. She is compliant with a low fat/cholesterol diet.     Diabetes: Her metformin and lisinopril was held secondary to acute kidney injury and she is currently on no medication.  She is fairly compliant with a diabetic diet. She was not exercising prior to her accident.   Anxiety: She is taking her medication daily as prescribed. She denies any side effects from the medication. She feels her anxiety is well controlled and she is happy with her current dose of medication.   Anemia:  She was anemic in the hospital.  She denies any bleeding.    Medications and allergies reviewed with patient and updated if appropriate.  Patient Active Problem List   Diagnosis Date Noted  . Syncope 11/20/2017  . Compression fracture of L1 lumbar vertebra, closed, initial encounter ([place]) 11/20/2017  . Vitamin D deficiency 02/22/2016  . Anxiety 02/22/2016  . CAD (coronary artery disease) 02/21/2016  . GERD (gastroesophageal reflux disease) 04/14/2015  . Inappropriate sinus tachycardia 03/16/2015  . S/P CABG x 3 12/01/2013  . NSTEMI (non-ST elevated myocardial infarction) ([place]) 11/26/2013  .  Type 2 diabetes mellitus with vascular disease () 08/28/2012  . Hyperlipidemia 12/18/2006  . Osteopenia 10/09/2006    Current Outpatient Medications on File Prior to Visit  Medication Sig Dispense Refill  . aspirin EC 81 MG tablet Take 81 mg by mouth daily.     [name] BIOTIN PO Take 5,000 mcg by mouth daily.     . bisacodyl (DULCOLAX) 5 MG EC tablet Take 5 mg by mouth daily as needed for moderate constipation.     . cholecalciferol (VITAMIN D) 1000 units tablet Take 1,000 Units  by mouth daily.    [name] glucose blood (ONE TOUCH ULTRA TEST) test strip Check blood sugar daily as directed, DX 250.02 100 each 12  . loratadine (CLARITIN) 10 MG tablet Take 1 tablet (10 mg total) by mouth daily. 90 tablet 3  . ondansetron (ZOFRAN-ODT) 4 MG disintegrating tablet Take 1 tablet (4 mg total) by mouth every 8 (eight) hours as needed for nausea or vomiting. 15 tablet 0  . ONETOUCH DELICA LANCETS FINE MISC 1 each by Does not apply route as directed. Check blood sugar daily as directed, DX 250.02 HOLD UNTIL PATIENT REQUEST 100 each 5  . oxyCODONE-acetaminophen (PERCOCET/ROXICET) 5-325 MG tablet Take 1 tablet by mouth every 8 (eight) hours as needed for up to 5 days for moderate pain or severe pain. 15 tablet 0  . ranitidine (ZANTAC) 150 MG tablet Take 1 tablet (150 mg total) by mouth 2 (two) times daily. 60 tablet 0  . senna-docusate (SENOKOT-S) 8.6-50 MG tablet Take 1 tablet by mouth at bedtime. 30 tablet 0  . sertraline (ZOLOFT) 25 MG tablet Take 1 tablet (25 mg total) by mouth daily. 30 tablet 0  . vitamin B-12 (CYANOCOBALAMIN) 1000 MCG tablet Take 1,000 mcg by mouth daily.     No current facility-administered medications on file prior to visit.     Past Medical History:  Diagnosis Date  . Compression fracture of L1 lumbar vertebra ([place]) 11/21/2017  . Coronary artery disease   . Diabetes mellitus without complication ([place])   . Gallbladder sludge   . GERD (gastroesophageal reflux disease)   . Heart disease   . Hyperlipidemia   . NSTEMI (non-ST elevated myocardial infarction) ([place])   . Osteopenia   . Syncope 11/2017    Past Surgical History:  Procedure Laterality Date  . CORONARY ARTERY BYPASS GRAFT N/A 12/01/2013   Procedure: CORONARY ARTERY BYPASS GRAFTING (CABG) x three, using left internal mammary artery, and right leg saphenous vein harvested endoscopically (LIMA to LAD, SVG to OM2 , SVG to dRCA);  Surgeon: Grace Isaac, MD;  Location: [facility];  Service: Open Heart  Surgery;  Laterality: N/A;  . G 1 P 1    . INTRAOPERATIVE TRANSESOPHAGEAL ECHOCARDIOGRAM N/A 12/01/2013   Procedure: INTRAOPERATIVE TRANSESOPHAGEAL ECHOCARDIOGRAM;  Surgeon: Grace Isaac, MD;  Location: [facility];  Service: Open Heart Surgery;  Laterality: N/A;  . LEFT HEART CATHETERIZATION WITH CORONARY ANGIOGRAM N/A 11/27/2013   Procedure: LEFT HEART CATHETERIZATION WITH CORONARY ANGIOGRAM;  Surgeon: Sinclair Grooms, MD;  Location: [facility] CATH LAB;  Service: Cardiovascular;  Laterality: N/A;  . no colonoscopy     SOC reviewed  . TONSILLECTOMY AND ADENOIDECTOMY      Social History   Socioeconomic History  . Marital status: Married    Spouse name: Not on file  . Number of children: Not on file  . Years of education: Not on file  . Highest education level: Not on file  Occupational  History  . Not on file  Social Needs  . Financial resource strain: Not on file  . Food insecurity:    Worry: Not on file    Inability: Not on file  . Transportation needs:    Medical: Not on file    Non-medical: Not on file  Tobacco Use  . Smoking status: Never Smoker  . Smokeless tobacco: Never Used  Substance and Sexual Activity  . Alcohol use: Yes    Comment: Occasionally   . Drug use: No  . Sexual activity: Not on file  Lifestyle  . Physical activity:    Days per week: Not on file    Minutes per session: Not on file  . Stress: Not on file  Relationships  . Social connections:    Talks on phone: Not on file    Gets together: Not on file    Attends religious service: Not on file    Active member of club or organization: Not on file    Attends meetings of clubs or organizations: Not on file    Relationship status: Not on file  Other Topics Concern  . Not on file  Social History Narrative  . Not on file    Family History  Problem Relation Age of Onset  . Heart attack Mother 52  . Diabetes Mother   . Stroke Mother   . Hypertension Mother   . Goiter Mother   . Heart attack Father          >55  . Bladder Cancer Father   . Parkinson's disease Maternal Aunt   . Osteoporosis Sister   . Diabetes Brother     Review of Systems  Constitutional: Positive for fatigue (for a while). Negative for chills and fever.  Respiratory: Negative for cough, shortness of breath and wheezing.   Cardiovascular: Negative for chest pain, palpitations and leg swelling.  Gastrointestinal: Positive for constipation. Negative for abdominal pain, blood in stool and nausea.  Genitourinary: Negative for dysuria and hematuria.  Neurological: Negative for light-headedness and headaches.       Objective:   Vitals:   11/26/17 0743  BP: 108/76  Pulse: (!) 104  Resp: 16  Temp: 97.7 F (36.5 C)  SpO2: 97%   BP Readings from Last 3 Encounters:  11/26/17 108/76  11/23/17 121/71  05/18/17 106/60   Wt Readings from Last 3 Encounters:  11/23/17 139 lb 1.8 oz (63.1 kg)  05/18/17 135 lb (61.2 kg)  04/13/17 133 lb (60.3 kg)   There is no height or weight on file to calculate BMI.   Physical Exam    Constitutional: Appears well-developed and well-nourished. No distress.  HENT:  Head: Normocephalic and atraumatic.  Neck: Neck supple. No tracheal deviation present. No thyromegaly present.  No cervical lymphadenopathy Cardiovascular: Normal rate, regular rhythm and normal heart sounds.   No murmur heard. No carotid bruit .  No edema Pulmonary/Chest: Effort normal and breath sounds normal. No respiratory distress. No has no wheezes. No rales.  Skin: Skin is warm and dry. Not diaphoretic.  Psychiatric: Normal mood and affect. Behavior is normal.      Assessment & Plan:    See Problem List for Assessment and Plan of chronic medical problems.

## 2017-11-26 ENCOUNTER — Ambulatory Visit (INDEPENDENT_AMBULATORY_CARE_PROVIDER_SITE_OTHER): Payer: Medicare Other | Admitting: Internal Medicine

## 2017-11-26 ENCOUNTER — Telehealth: Payer: Self-pay

## 2017-11-26 ENCOUNTER — Encounter: Payer: Self-pay | Admitting: Internal Medicine

## 2017-11-26 ENCOUNTER — Other Ambulatory Visit (INDEPENDENT_AMBULATORY_CARE_PROVIDER_SITE_OTHER): Payer: Medicare Other

## 2017-11-26 VITALS — BP 108/76 | HR 104 | Temp 97.7°F | Resp 16

## 2017-11-26 DIAGNOSIS — F419 Anxiety disorder, unspecified: Secondary | ICD-10-CM

## 2017-11-26 DIAGNOSIS — D649 Anemia, unspecified: Secondary | ICD-10-CM | POA: Diagnosis not present

## 2017-11-26 DIAGNOSIS — K219 Gastro-esophageal reflux disease without esophagitis: Secondary | ICD-10-CM

## 2017-11-26 DIAGNOSIS — E7849 Other hyperlipidemia: Secondary | ICD-10-CM | POA: Diagnosis not present

## 2017-11-26 DIAGNOSIS — S32010A Wedge compression fracture of first lumbar vertebra, initial encounter for closed fracture: Secondary | ICD-10-CM

## 2017-11-26 DIAGNOSIS — N179 Acute kidney failure, unspecified: Secondary | ICD-10-CM | POA: Insufficient documentation

## 2017-11-26 DIAGNOSIS — R5383 Other fatigue: Secondary | ICD-10-CM

## 2017-11-26 DIAGNOSIS — E1159 Type 2 diabetes mellitus with other circulatory complications: Secondary | ICD-10-CM | POA: Diagnosis not present

## 2017-11-26 LAB — COMPREHENSIVE METABOLIC PANEL
ALBUMIN: 4.2 g/dL (ref 3.5–5.2)
ALT: 14 U/L (ref 0–35)
AST: 17 U/L (ref 0–37)
Alkaline Phosphatase: 73 U/L (ref 39–117)
BUN: 22 mg/dL (ref 6–23)
CALCIUM: 9.8 mg/dL (ref 8.4–10.5)
CHLORIDE: 106 meq/L (ref 96–112)
CO2: 18 meq/L — AB (ref 19–32)
CREATININE: 1.24 mg/dL — AB (ref 0.40–1.20)
GFR: 45.38 mL/min — AB (ref >60.00)
Glucose, Bld: 187 mg/dL — ABNORMAL HIGH (ref 70–99)
POTASSIUM: 3.5 meq/L (ref 3.5–5.1)
Sodium: 140 mEq/L (ref 135–145)
Total Bilirubin: 0.6 mg/dL (ref 0.2–1.2)
Total Protein: 7.6 g/dL (ref 6.0–8.3)

## 2017-11-26 LAB — CBC WITH DIFFERENTIAL/PLATELET
BASOS PCT: 0.6 % (ref 0.0–3.0)
Basophils Absolute: 0.1 10*3/uL (ref 0.0–0.1)
EOS ABS: 0.3 10*3/uL (ref 0.0–0.7)
Eosinophils Relative: 2.5 % (ref 0.0–5.0)
HEMATOCRIT: 32.4 % — AB (ref 36.0–46.0)
HEMOGLOBIN: 11.2 g/dL — AB (ref 12.0–15.0)
LYMPHS PCT: 16.4 % (ref 12.0–46.0)
Lymphs Abs: 1.8 10*3/uL (ref 0.7–4.0)
MCHC: 34.5 g/dL (ref 30.0–36.0)
MCV: 84.1 fl (ref 78.0–100.0)
MONO ABS: 0.7 10*3/uL (ref 0.1–1.0)
Monocytes Relative: 5.9 % (ref 3.0–12.0)
NEUTROS ABS: 8.2 10*3/uL — AB (ref 1.4–7.7)
Neutrophils Relative %: 74.6 % (ref 43.0–77.0)
Platelets: 316 10*3/uL (ref 150.0–400.0)
RBC: 3.85 Mil/uL — ABNORMAL LOW (ref 3.87–5.11)
RDW: 15.2 % (ref 11.5–15.5)
WBC: 11 10*3/uL — ABNORMAL HIGH (ref 4.0–10.5)

## 2017-11-26 LAB — IBC PANEL
Iron: 36 ug/dL — ABNORMAL LOW (ref 42–145)
Saturation Ratios: 11.4 % — ABNORMAL LOW (ref 20.0–50.0)
TRANSFERRIN: 225 mg/dL (ref 212.0–360.0)

## 2017-11-26 LAB — LIPID PANEL
CHOL/HDL RATIO: 4
CHOLESTEROL: 120 mg/dL (ref 0–200)
HDL: 28.2 mg/dL — AB (ref >39.00)
LDL CALC: 53 mg/dL (ref 0–99)
NonHDL: 91.64
TRIGLYCERIDES: 191 mg/dL — AB (ref 0.0–149.0)
VLDL: 38.2 mg/dL (ref 0.0–40.0)

## 2017-11-26 LAB — IRON: Iron: 36 ug/dL — ABNORMAL LOW (ref 42–145)

## 2017-11-26 LAB — HEMOGLOBIN A1C: Hgb A1c MFr Bld: 10.3 % — ABNORMAL HIGH (ref 4.6–6.5)

## 2017-11-26 LAB — TSH: TSH: 2.7 u[IU]/mL (ref 0.35–4.50)

## 2017-11-26 LAB — FERRITIN: Ferritin: 237.4 ng/mL (ref 10.0–291.0)

## 2017-11-26 MED ORDER — OXYCODONE-ACETAMINOPHEN 5-325 MG PO TABS: 1.0000 | ORAL_TABLET | Freq: Three times a day (TID) | ORAL | 0 refills | Status: DC | PRN

## 2017-11-26 NOTE — Assessment & Plan Note (Signed)
Check lipid pane, cmp  Continue daily statin

## 2017-11-26 NOTE — Assessment & Plan Note (Signed)
Will follow up with neurosurgery in 2 weeks Continue percocet for pain management - will refill for two weeks Has brace

## 2017-11-26 NOTE — Assessment & Plan Note (Signed)
Lab Results  Component Value Date   HGBA1C 13.1 (H) 04/13/2017   Recheck a1c Metformin held due to AKI Will need to restart medication but will see what renal function is and a1c before starting medication Follow up in 3 months

## 2017-11-26 NOTE — Assessment & Plan Note (Signed)
GERD controlled, no more nausea Continue daily medication

## 2017-11-26 NOTE — Assessment & Plan Note (Signed)
Has been feeling fatigued for a while Feels better since lisinopril was stopped - ? Hypotension Will check labs - cbc, iron panel given anemia, cmp, tsh

## 2017-11-26 NOTE — Assessment & Plan Note (Signed)
Controlled, stable Continue current dose of medication  

## 2017-11-26 NOTE — Telephone Encounter (Signed)
Called spouse to give lab results and patient inquired about getting a heart monitor. Informed patient that I would look into it and get back to her. She voiced understanding. Kathryn Logan

## 2017-11-26 NOTE — Assessment & Plan Note (Signed)
cmp Improved with hydration Lisinopril and metformin held

## 2017-11-26 NOTE — Telephone Encounter (Signed)
Spoke with patient and made appt with Rosaria Ferries, PA-C. Patient voiced understanding.

## 2017-11-26 NOTE — Assessment & Plan Note (Signed)
Check cbc, iron panel No bleeding per patient

## 2017-11-29 ENCOUNTER — Telehealth: Payer: Self-pay | Admitting: Emergency Medicine

## 2017-11-29 NOTE — Telephone Encounter (Signed)
Please advise on results

## 2017-11-29 NOTE — Telephone Encounter (Signed)
Copied from [place] [id number]. Topic: Inquiry >> Nov 29, 2017  8:41 AM Conception Chancy, NT wrote: Reason for CRM: patient would like Dr. Quay Burow to contact her with her lab results.

## 2017-11-30 ENCOUNTER — Telehealth: Payer: Self-pay

## 2017-11-30 NOTE — Telephone Encounter (Signed)
Pt has seen results on mychart.Marland KitchenJohny Logan

## 2017-11-30 NOTE — Telephone Encounter (Signed)
-----   Message from Binnie Rail, MD sent at 11/30/2017  8:00 AM EDT ----- Thyroid function normal.  Iron level slightly low.  Anemia is better and her blood counts are close to normal..  Kidney function is better than the week prior, but still reduced.  Liver tests are normal.   Cholesterol is good, except your triglycerides a little high and HDL or good cholesterol is on the low side - this is not new.  Her sugars are poorly controlled - her a1c is 10.3.  It needs to be less than 7.  We need to start medication.  Ok to start metformin 500 mg once daily.  She also needs another medication - will try farxiga 5 mg if covered.  Needs repeat bmp in 7-10 days.

## 2017-11-30 NOTE — Telephone Encounter (Signed)
See result note.  

## 2017-11-30 NOTE — Telephone Encounter (Signed)
Pt has been informed and expressed understanding.  

## 2017-12-03 ENCOUNTER — Telehealth: Payer: Self-pay | Admitting: Internal Medicine

## 2017-12-03 NOTE — Telephone Encounter (Signed)
Copied from [place] [id number]. Topic: General - Other >> Dec 03, 2017  3:23 PM Cecelia Byars, NT wrote: Reason for CRM: The patient called and said Dr Quay Burow told her  during her visit she would give her something to supplement her metformin please call [phone] she would like a call back either way

## 2017-12-04 ENCOUNTER — Other Ambulatory Visit: Payer: Self-pay | Admitting: Internal Medicine

## 2017-12-04 DIAGNOSIS — K219 Gastro-esophageal reflux disease without esophagitis: Secondary | ICD-10-CM

## 2017-12-04 MED ORDER — METFORMIN HCL 500 MG PO TABS: 500.0000 mg | ORAL_TABLET | Freq: Every day | ORAL | 3 refills | Status: DC

## 2017-12-04 MED ORDER — DAPAGLIFLOZIN PROPANEDIOL 5 MG PO TABS: 5.0000 mg | ORAL_TABLET | Freq: Every day | ORAL | 1 refills | Status: DC

## 2017-12-04 NOTE — Telephone Encounter (Signed)
She has metformin at home - pending to add back on med list to update med list   farxiga pending - one pill daily -- if ok [facility] to [facility] for her to try.  She needs more than just the metformin.

## 2017-12-04 NOTE — Telephone Encounter (Signed)
Spoke with pt, sent RX to [facility]. Pt will call back if med is too expensive.

## 2017-12-05 ENCOUNTER — Encounter: Payer: Self-pay | Admitting: Physician Assistant

## 2017-12-05 ENCOUNTER — Ambulatory Visit (INDEPENDENT_AMBULATORY_CARE_PROVIDER_SITE_OTHER): Payer: Medicare Other | Admitting: Physician Assistant

## 2017-12-05 VITALS — BP 114/80 | HR 103 | Ht 62.0 in | Wt 129.0 lb

## 2017-12-05 DIAGNOSIS — Z951 Presence of aortocoronary bypass graft: Secondary | ICD-10-CM

## 2017-12-05 DIAGNOSIS — R55 Syncope and collapse: Secondary | ICD-10-CM

## 2017-12-05 DIAGNOSIS — R000 Tachycardia, unspecified: Secondary | ICD-10-CM | POA: Diagnosis not present

## 2017-12-05 MED ORDER — ROSUVASTATIN CALCIUM 40 MG PO TABS: 40.0000 mg | ORAL_TABLET | Freq: Every day | ORAL | 1 refills | Status: DC

## 2017-12-05 MED ORDER — CARVEDILOL 3.125 MG PO TABS: 3.1250 mg | ORAL_TABLET | Freq: Two times a day (BID) | ORAL | 3 refills | Status: DC

## 2017-12-05 NOTE — Patient Instructions (Signed)
Medication Instructions: Your physician recommends that you continue on your current medications as directed. Please refer to the Current Medication list given to you today.  Restart Carvedilol 3.125 mg twice daily  Continue Rosuvastatin 40 mg daily.   Testing/Procedures: Your physician has recommended that you wear a 30 day event monitor. Event monitors are medical devices that record the heart's electrical activity. Doctors most often [place] these monitors to diagnose arrhythmias. Arrhythmias are problems with the speed or rhythm of the heartbeat. The monitor is a small, portable device. You can wear one while you do your normal daily activities. This is usually used to diagnose what is causing palpitations/syncope (passing out).  Follow-Up: Your physician recommends that you schedule a follow-up appointment in: 6-8 weeks with Dr. Debara Pickett.  If you need a refill on your cardiac medications before your next appointment, please call your pharmacy.

## 2017-12-05 NOTE — Progress Notes (Signed)
Cardiology Office Note   Date:  12/05/2017   ID:  Kathryn Logan, DOB [birth year], MRN [id number]  PCP:  Binnie Rail, MD  Cardiologist: Dr. Debara Pickett, 06/09/2016 Rosaria Ferries, PA-C   Chief Complaint  Patient presents with  . Follow-up  . Loss of Consciousness    History of Present Illness: Kathryn Logan is a 72 y.o. female with a history of NSTEMI 2015 s/p CABG w/ LIMA-LAD, SVG-OM2, SVG-dRCA, EF 50-55% Echo 11/21/2017, DM2, HTN, HLD,   Admitted 7/9-7/04/2018 for syncope and back pain, found to have L1 fracture, renal function worse than usual w/ Cr up to 1.59 and BUN 29 and lisinopril/metformin held, outpatient monitoring planned. SBP 90s on admission, orthostatic VS not done.   Kathryn Logan presents for cardiology follow up.  She feels she passed out because she got dehydrated. She had been feeling bad and her BP had been low.   Since being off the lisinopril, her creatinine has stabilized. Dr Quay Burow is following that.   She is walking 5"/hr at the [facility] instructions, sees them soon.   She was walking an hour a day last year. Stopped that because her husband was concerned for her safety and she just did not have the energy.   Even before she fell, she would get wiped out by doing 2 loads of clothes and washing dishes.   She wonders if her BP was low for a long time before she fell.   No LE edema, no orthopnea or PND.   She never gets chest pain.   She never gets palpitations, never feels her heart skip or race.   No presyncope or syncope since d/c.    Past Medical History:  Diagnosis Date  . Compression fracture of L1 lumbar vertebra ([place]) 11/21/2017  . Diabetes mellitus without complication ([place])   . Gallbladder sludge   . GERD (gastroesophageal reflux disease)   . Heart disease   . Hyperlipidemia   . NSTEMI (non-ST elevated myocardial infarction) ([place]) 11/2013  . Osteopenia   . Syncope 11/2017    Past Surgical History:  Procedure Laterality  Date  . CORONARY ARTERY BYPASS GRAFT N/A 12/01/2013   Procedure: CORONARY ARTERY BYPASS GRAFTING (CABG) x three, using left internal mammary artery, and right leg saphenous vein harvested endoscopically (LIMA to LAD, SVG to OM2 , SVG to dRCA);  Surgeon: Grace Isaac, MD;  Location: [place];  Service: Open Heart Surgery;  Laterality: N/A;  . G 1 P 1    . INTRAOPERATIVE TRANSESOPHAGEAL ECHOCARDIOGRAM N/A 12/01/2013   Procedure: INTRAOPERATIVE TRANSESOPHAGEAL ECHOCARDIOGRAM;  Surgeon: Grace Isaac, MD;  Location: [place];  Service: Open Heart Surgery;  Laterality: N/A;  . LEFT HEART CATHETERIZATION WITH CORONARY ANGIOGRAM N/A 11/27/2013   Procedure: LEFT HEART CATHETERIZATION WITH CORONARY ANGIOGRAM;  Surgeon: Sinclair Grooms, MD;  Location: [facility] CATH LAB;  Service: Cardiovascular;  Laterality: N/A;  . no colonoscopy     SOC reviewed  . TONSILLECTOMY AND ADENOIDECTOMY      Current Outpatient Medications  Medication Sig Dispense Refill  . aspirin EC 81 MG tablet Take 81 mg by mouth daily.     [name] BIOTIN PO Take 5,000 mcg by mouth daily.     . bisacodyl (DULCOLAX) 5 MG EC tablet Take 5 mg by mouth 3 (three) times a week.     . cholecalciferol (VITAMIN D) 1000 units tablet Take 1,000 Units by mouth daily.    [name] glucose blood (ONE TOUCH  ULTRA TEST) test strip Check blood sugar daily as directed, DX 250.02 100 each 12  . loratadine (CLARITIN) 10 MG tablet Take 1 tablet (10 mg total) by mouth daily. 90 tablet 3  . metFORMIN (GLUCOPHAGE) 500 MG tablet Take 1 tablet (500 mg total) by mouth daily with breakfast. 90 tablet 3  . ONETOUCH DELICA LANCETS FINE MISC 1 each by Does not apply route as directed. Check blood sugar daily as directed, DX 250.02 HOLD UNTIL PATIENT REQUEST 100 each 5  . ranitidine (ZANTAC) 150 MG tablet TAKE 1 TABLET BY MOUTH TWICE A DAY 180 tablet 1  . sertraline (ZOLOFT) 25 MG tablet Take 1 tablet (25 mg total) by mouth daily. 30 tablet 0  . vitamin B-12 (CYANOCOBALAMIN) 1000 MCG  tablet Take 1,000 mcg by mouth daily.    . dapagliflozin propanediol (FARXIGA) 5 MG TABS tablet Take 5 mg by mouth daily. (Patient not taking: Reported on 12/05/2017) 90 tablet 1  . senna-docusate (SENOKOT-S) 8.6-50 MG tablet Take 1 tablet by mouth at bedtime. (Patient not taking: Reported on 12/05/2017) 30 tablet 0   No current facility-administered medications for this visit.     Allergies:   Patient has no known allergies.    Social History:  The patient  reports that she has never smoked. She has never used smokeless tobacco. She reports that she drinks alcohol. She reports that she does not use drugs.   Family History:  The patient's family history includes Bladder Cancer in her father; Diabetes in her brother and mother; Goiter in her mother; Heart attack in her father; Heart attack (age of onset: 1) in her mother; Hypertension in her mother; Osteoporosis in her sister; Parkinson's disease in her maternal aunt; Stroke in her mother.    ROS:  Please see the history of present illness. All other systems are reviewed and negative.    PHYSICAL EXAM: VS:  BP 114/80 (BP Location: Left Arm, Patient Position: Sitting, Cuff Size: Normal)   Pulse (!) 103   Ht 5\' 2"  (1.575 m)   Wt 129 lb (58.5 kg)   BMI 23.59 kg/m  , BMI Body mass index is 23.59 kg/m. GEN: Well nourished, well developed, female in no acute distress  HEENT: normal for age  Neck: no JVD, no carotid bruit, no masses Cardiac: RRR; soft murmur, no rubs, or gallops Respiratory:  clear to auscultation bilaterally, normal work of breathing GI: soft, nontender, nondistended, + BS MS: no deformity or atrophy; no edema; distal pulses are 2+ in all 4 extremities   Skin: warm and dry, no rash Neuro:  Strength and sensation are intact Psych: euthymic mood, full affect   EKG:  EKG is ordered today. The ekg ordered today demonstrates ST, HR 103, no acute ischemic changes, no morphology changes from 11/20/2017  ECHO: 11/21/2017 -  Left ventricle: The cavity size was normal. Systolic function was   normal. The estimated ejection fraction was in the range of 50%   to 55%. Hypokinesis of the anteroseptal and inferoseptal   myocardium; unchanged from the previous study. Left ventricular   diastolic function parameters were normal. - Ventricular septum: Septal motion showed &quot;bounce&quot;. The contour   showed mild systolic flattening. These changes are consistent   with a post-thoracotomy state. - Right ventricle: The cavity size was mildly dilated. Wall   thickness was normal. Systolic function was mildly reduced. Impressions: - Post-CABG septal motion, otherwise normal EF.   Recent Labs: 11/26/2017: ALT 14; BUN 22; Creatinine, Ser 1.24; Hemoglobin  11.2; Platelets 316.0; Potassium 3.5; Sodium 140; TSH 2.70    Lipid Panel    Component Value Date/Time   CHOL 120 11/26/2017 0817   CHOL 249 (H) 08/30/2012 0852   TRIG 191.0 (H) 11/26/2017 0817   TRIG 160 (H) 08/30/2012 0852   HDL 28.20 (L) 11/26/2017 0817   HDL 40 08/30/2012 0852   CHOLHDL 4 11/26/2017 0817   VLDL 38.2 11/26/2017 0817   LDLCALC 53 11/26/2017 0817   LDLCALC 177 (H) 08/30/2012 0852   LDLDIRECT 100.0 04/13/2017 1025     Wt Readings from Last 3 Encounters:  12/05/17 129 lb (58.5 kg)  11/23/17 139 lb 1.8 oz (63.1 kg)  05/18/17 135 lb (61.2 kg)     Other studies Reviewed: Additional studies/ records that were reviewed today include: Office notes, hospital records and testing.  ASSESSMENT AND PLAN:  1.  Syncope: Unknown type -Dr. Debara Pickett was able to break free and speak with the patient and her husband briefly - The patient was told that she is not supposed to drive for 6 months. - A 30-day monitor is indicated, she does have a history of inappropriate sinus tachycardia as well as the recent syncopal event. - If the monitor does not show anything, then that the patient needs to make sure she stays hydrated and keep an eye on her blood  pressure  2.  Inappropriate sinus tachycardia: She had been on Coreg 25 mg twice daily when she saw Dr. Quay Burow in January 2019.  However, when she was admitted after her syncopal episode, the carvedilol was off her med list.  The patient does not remember it being stopped. -Her blood pressure will not tolerate 25 mg, will restart the carvedilol 3.125 mg twice daily.  She is to follow her blood pressure and heart rate on this and let us know if she has side effects.  3.  CAD s/p CABG 2015: - She is having no ischemic symptoms.  Crestor was not on her medication list, but she is taking this for her cholesterol, which is followed by Dr. Quay Burow. -She is also on aspirin, and we are restarting the beta-blocker. -She was on lisinopril 2.5 mg a day, but this was stopped during the hospitalization for syncope and she feels better off of it.   Current medicines are reviewed at length with the patient today.  The patient has concerns regarding medicines.  Concerns were addressed  The following changes have been made: Restart carvedilol.  Labs/ tests ordered today include:   Orders Placed This Encounter  Procedures  . CARDIAC EVENT MONITOR  . EKG 12-Lead     Disposition:   FU with Dr. Debara Pickett  Signed, Rosaria Ferries, PA-C  12/05/2017 12:12 PM    [place] Phone: [phone]; Fax: [phone]  This note was written with the assistance of speech recognition software. Please excuse any transcriptional errors.

## 2017-12-07 ENCOUNTER — Ambulatory Visit (INDEPENDENT_AMBULATORY_CARE_PROVIDER_SITE_OTHER): Payer: Medicare Other

## 2017-12-07 DIAGNOSIS — R55 Syncope and collapse: Secondary | ICD-10-CM | POA: Diagnosis not present

## 2017-12-08 ENCOUNTER — Other Ambulatory Visit: Payer: Self-pay | Admitting: Internal Medicine

## 2017-12-11 DIAGNOSIS — S32010D Wedge compression fracture of first lumbar vertebra, subsequent encounter for fracture with routine healing: Secondary | ICD-10-CM | POA: Diagnosis not present

## 2017-12-11 DIAGNOSIS — I10 Essential (primary) hypertension: Secondary | ICD-10-CM | POA: Diagnosis not present

## 2017-12-21 DIAGNOSIS — H2513 Age-related nuclear cataract, bilateral: Secondary | ICD-10-CM | POA: Diagnosis not present

## 2017-12-21 DIAGNOSIS — H5213 Myopia, bilateral: Secondary | ICD-10-CM | POA: Diagnosis not present

## 2017-12-21 LAB — HM DIABETES EYE EXAM

## 2017-12-27 ENCOUNTER — Telehealth: Payer: Self-pay | Admitting: Internal Medicine

## 2017-12-27 MED ORDER — METFORMIN HCL 500 MG PO TABS: 500.0000 mg | ORAL_TABLET | Freq: Every day | ORAL | 1 refills | Status: DC

## 2017-12-27 NOTE — Telephone Encounter (Signed)
Copied from [place] [phone] [phone]. Topic: Quick Communication - See Telephone Encounter >> Dec 27, 2017  1:14 PM Bea Graff, NT wrote: CRM for notification. See Telephone encounter for: 12/27/17. Pt would like to speak with Dr. Quay Burow nurse regarding her metFORMIN (GLUCOPHAGE) 500 MG tablet. She is not sure if she is suppose to be taking 500mg  or 1000mg  per day. Please call to clarify.

## 2017-12-27 NOTE — Telephone Encounter (Signed)
Spoke with pt to advise she should be only taking 500 mg once daily. RX sent to [facility] for refill.

## 2018-01-15 ENCOUNTER — Telehealth: Payer: Self-pay | Admitting: Physician Assistant

## 2018-01-15 NOTE — Telephone Encounter (Signed)
New Message        Patient is calling to get results from her heart monitor.

## 2018-01-15 NOTE — Telephone Encounter (Signed)
Spoke with pt, aware results are not available yet.

## 2018-01-22 DIAGNOSIS — S32010D Wedge compression fracture of first lumbar vertebra, subsequent encounter for fracture with routine healing: Secondary | ICD-10-CM | POA: Diagnosis not present

## 2018-01-23 ENCOUNTER — Other Ambulatory Visit: Payer: Self-pay | Admitting: Student

## 2018-01-23 DIAGNOSIS — S32010D Wedge compression fracture of first lumbar vertebra, subsequent encounter for fracture with routine healing: Secondary | ICD-10-CM

## 2018-01-31 ENCOUNTER — Encounter: Payer: Self-pay | Admitting: Internal Medicine

## 2018-01-31 ENCOUNTER — Ambulatory Visit (INDEPENDENT_AMBULATORY_CARE_PROVIDER_SITE_OTHER): Payer: Medicare Other | Admitting: Internal Medicine

## 2018-01-31 VITALS — BP 114/70 | HR 95 | Ht 62.0 in | Wt 130.0 lb

## 2018-01-31 DIAGNOSIS — Z951 Presence of aortocoronary bypass graft: Secondary | ICD-10-CM

## 2018-01-31 DIAGNOSIS — R55 Syncope and collapse: Secondary | ICD-10-CM | POA: Diagnosis not present

## 2018-01-31 DIAGNOSIS — R000 Tachycardia, unspecified: Secondary | ICD-10-CM | POA: Diagnosis not present

## 2018-01-31 NOTE — Progress Notes (Signed)
OFFICE NOTE  Chief Complaint:  No complaints  Primary Care Physician: Binnie Rail, MD  HPI:  Kathryn Logan is a 72 year old female with a h/o diet controlled DM but no prior cardiac history who presented to [facility] on 11/26/2013 with chest discomfort with associated nausea and diaphoresis. EKG demonstrated nonspecific T-wave abnormalities. Initial cardiac enzymes were elevated and she ruled in for non-ST elevation myocardial infarction. Subsequently, she underwent a left heart catheterization, perform by Dr. Tamala Julian, which revealed severe coronary artery disease involving the proximal LAD, the first and second marginal branches and a nondominant right coronary artery. She was found to have moderate to severe left ventricular systolic dysfunction with an estimated ejection fraction of 30-35%. However, post-cath 2-D echocardiogram revealed an EF of 40-45%. Surgical revascularization was recommended for her CAD. She ultimately underwent CABG x3 by Dr. Servando Snare on 12/01/2013. She received a LIMA-LAD, SVG-OM2, SVG-dRCA. She had an uncomplicated postoperative course. She was discharged home on 12/05/2013. She was discharged home on aspirin, metoprolol, lisinopril and atorvastatin.  [name] was recently seen by Ellen Henri, PA-C., who recommended increasing her beta blocker due to tachycardia. Recently she's been having problems with hypotension, mostly in the morning at cardiac rehabilitation. She is generally asymptomatic with this.  I saw [name] back in the office today. Overall she is doing really well. She seems to be maintaining a high normal heart rate in the 80s and 90s. Think a lot of this may be due to anxiety. She's had low blood pressure in the past however I moved her lisinopril to take at night and her blood pressures been more stable. She denies any chest pain or worsening shortness of breath. She's not on any exercise over the past month and has a small  amount of weight gain. She signed up for maintenance cardiac redilatation starting in a couple of days. Hopefully this will help her with weight loss and better cardiac conditioning.  [name] returns today for follow-up. Overall she seems to be doing well. She denies any chest pain and only had some mild shortness of breath when [place]. She says she can do most activities here without any problems. She continues to have a persistent tachycardia. Heart rate today was close to 120 at rest. She did take her beta blocker and recently that dose had been increased up to 50 mg twice daily of Lopressor. She does not feel fatigued or any different on a higher dose of the medicine and does not perceive any significant change in her heart rate.  06/09/2016  Mrs. Pack was seen today in follow-up. Over the past year she's done very well. She denies any chest pain or worsening shortness of breath. She notes that the change to carvedilol has helped her tachycardia. She is due for some repeat lab work with his which was ordered by Dr. Quay Burow but she has not yet had that drawn.  02/10/2018  [name] returns today for follow-up.  In July she saw Rosaria Ferries, PA-C, after a syncopal episode.  Was felt that she may have been dehydrated in combination with a higher dose blood pressure medicine and recent weight loss.  Her medications were stopped and she was rehydrated.  Unfortunately she had a lumbar fracture for which she continues to wear back brace.  Hopefully she will be out of that in a few weeks.  She denies any chest pain or worsening shortness of breath and has had  no further syncopal episodes.  She is now on low-dose carvedilol.  He did wear a monitor which showed no evidence of A. fib, ventricular arrhythmias, pauses or any explanation for syncope rather just very infrequent PACs and PVCs less than 1%.  PMHx:  Past Medical History:  Diagnosis Date  . Compression fracture of L1  lumbar vertebra ([place]) 11/21/2017  . Diabetes mellitus without complication ([place])   . Gallbladder sludge   . GERD (gastroesophageal reflux disease)   . Heart disease   . Hyperlipidemia   . NSTEMI (non-ST elevated myocardial infarction) ([place]) 11/2013  . Osteopenia   . Syncope 11/2017    Past Surgical History:  Procedure Laterality Date  . CORONARY ARTERY BYPASS GRAFT N/A 12/01/2013   Procedure: CORONARY ARTERY BYPASS GRAFTING (CABG) x three, using left internal mammary artery, and right leg saphenous vein harvested endoscopically (LIMA to LAD, SVG to OM2 , SVG to dRCA);  Surgeon: Grace Isaac, MD;  Location: [place];  Service: Open Heart Surgery;  Laterality: N/A;  . G 1 P 1    . INTRAOPERATIVE TRANSESOPHAGEAL ECHOCARDIOGRAM N/A 12/01/2013   Procedure: INTRAOPERATIVE TRANSESOPHAGEAL ECHOCARDIOGRAM;  Surgeon: Grace Isaac, MD;  Location: [place];  Service: Open Heart Surgery;  Laterality: N/A;  . LEFT HEART CATHETERIZATION WITH CORONARY ANGIOGRAM N/A 11/27/2013   Procedure: LEFT HEART CATHETERIZATION WITH CORONARY ANGIOGRAM;  Surgeon: Sinclair Grooms, MD;  Location: [facility] CATH LAB;  Service: Cardiovascular;  Laterality: N/A;  . no colonoscopy     SOC reviewed  . TONSILLECTOMY AND ADENOIDECTOMY      FAMHx:  Family History  Problem Relation Age of Onset  . Heart attack Mother 18  . Diabetes Mother   . Stroke Mother   . Hypertension Mother   . Goiter Mother   . Heart attack Father        >55  . Bladder Cancer Father   . Parkinson's disease Maternal Aunt   . Osteoporosis Sister   . Diabetes Brother     SOCHx:   reports that she has never smoked. She has never used smokeless tobacco. She reports that she drinks alcohol. She reports that she does not use drugs.  ALLERGIES:  No Known Allergies  ROS: A comprehensive review of systems was negative.  HOME MEDS: Current Outpatient Medications  Medication Sig Dispense Refill  . aspirin EC 81 MG tablet Take 81 mg by mouth  daily.     [name] BIOTIN PO Take 5,000 mcg by mouth daily.     . bisacodyl (DULCOLAX) 5 MG EC tablet Take 5 mg by mouth 3 (three) times a week.     . carvedilol (COREG) 3.125 MG tablet Take 1 tablet (3.125 mg total) by mouth 2 (two) times daily. 180 tablet 3  . cholecalciferol (VITAMIN D) 1000 units tablet Take 1,000 Units by mouth daily.    . dapagliflozin propanediol (FARXIGA) 5 MG TABS tablet Take 5 mg by mouth daily. 90 tablet 1  . glucose blood (ONE TOUCH ULTRA TEST) test strip Check blood sugar daily as directed, DX 250.02 100 each 12  . loratadine (CLARITIN) 10 MG tablet Take 1 tablet (10 mg total) by mouth daily. 90 tablet 3  . metFORMIN (GLUCOPHAGE) 500 MG tablet Take 1 tablet (500 mg total) by mouth daily with breakfast. 90 tablet 1  . ONETOUCH DELICA LANCETS FINE MISC 1 each by Does not apply route as directed. Check blood sugar daily as directed, DX 250.02 HOLD UNTIL PATIENT REQUEST 100 each  5  . ranitidine (ZANTAC) 150 MG tablet TAKE 1 TABLET BY MOUTH TWICE A DAY 180 tablet 1  . rosuvastatin (CRESTOR) 40 MG tablet Take 1 tablet (40 mg total) by mouth daily. 90 tablet 1  . senna-docusate (SENOKOT-S) 8.6-50 MG tablet Take 1 tablet by mouth at bedtime. 30 tablet 0  . sertraline (ZOLOFT) 25 MG tablet TAKE 1 TABLET BY MOUTH EVERY DAY 30 tablet 2  . vitamin B-12 (CYANOCOBALAMIN) 1000 MCG tablet Take 1,000 mcg by mouth daily.     No current facility-administered medications for this visit.     LABS/IMAGING: No results found for this or any previous visit (from the past 48 hour(s)). No results found.  VITALS: BP 114/70 (BP Location: Left Arm, Patient Position: Sitting, Cuff Size: Normal)   Pulse 95   Ht 5\' 2"  (1.575 m)   Wt 130 lb (59 kg)   BMI 23.78 kg/m   EXAM: General appearance: alert and no distress Neck: no carotid bruit and no JVD Lungs: clear to auscultation bilaterally Heart: regular rate and rhythm, S1, S2 normal, no murmur, click, rub or gallop Abdomen: soft,  non-tender; bowel sounds normal; no masses,  no organomegaly Extremities: extremities normal, atraumatic, no cyanosis or edema Pulses: 2+ and symmetric Skin: Skin color, texture, turgor normal. No rashes or lesions Neurologic: Grossly normal Psych: Anxious  EKG: Deferred  ASSESSMENT: 1. Syncope due to orthostatic hypotension 2. Coronary artery disease status post three-vessel CABG in 2015 (LIMA-LAD, SVG-OM2, SVG-dRCA) 3. DM2 4. Dyslipidemia 5. Inappropriate sinus tachycardia   PLAN: 1.   Mrs. Felker unfortunately had syncope due to orthostatic hypotension.  Her blood pressure medication has been reduced significantly.  She maintains a low-dose beta-blocker and has good heart rate control.  Her monitor failed to show any events that would have explained her syncope.  She denies any further chest pain.  Overall she is doing much better.  Hopefully she will come out of her back brace next month.  Follow-up with me annually or sooner as necessary.  Pixie Casino, MD, [facility], [place] Director of the [facility] Lipid Disorders &  Cardiovascular Risk Reduction Clinic Diplomate of the American Board of Clinical Lipidology Attending Cardiologist  Direct Dial: [phone]  Fax: [phone]  Website:  [email or web address] [email or web address] Abeer Deskins 01/31/2018, 9:09 AM

## 2018-01-31 NOTE — Patient Instructions (Signed)
Medication Instructions:  Your physician recommends that you continue on your current medications as directed. Please refer to the Current Medication list given to you today.  Follow-Up: Your physician wants you to follow-up in: 12 months with Dr. Debara Pickett.  You will receive a reminder letter in the mail two months in advance. If you don't receive a letter, please call our office to schedule the follow-up appointment.   Any Other Special Instructions Will Be Listed Below (If Applicable).     If you need a refill on your cardiac medications before your next appointment, please call your pharmacy.

## 2018-02-12 ENCOUNTER — Ambulatory Visit
Admission: RE | Admit: 2018-02-12 | Discharge: 2018-02-12 | Disposition: A | Discharge status: Home / Self Care | Payer: Medicare Other | Source: Ambulatory Visit | Attending: Student | Admitting: Student

## 2018-02-12 DIAGNOSIS — S32010D Wedge compression fracture of first lumbar vertebra, subsequent encounter for fracture with routine healing: Secondary | ICD-10-CM | POA: Diagnosis not present

## 2018-02-12 DIAGNOSIS — M545 Low back pain: Secondary | ICD-10-CM | POA: Diagnosis not present

## 2018-02-27 ENCOUNTER — Ambulatory Visit: Payer: Medicare Other | Admitting: Internal Medicine

## 2018-02-28 ENCOUNTER — Ambulatory Visit: Payer: Medicare Other | Admitting: Internal Medicine

## 2018-03-26 ENCOUNTER — Ambulatory Visit: Payer: Medicare Other | Admitting: Internal Medicine

## 2018-03-31 ENCOUNTER — Other Ambulatory Visit: Payer: Self-pay | Admitting: Internal Medicine

## 2018-04-03 ENCOUNTER — Other Ambulatory Visit: Payer: Self-pay | Admitting: Internal Medicine

## 2018-04-24 NOTE — Patient Instructions (Addendum)
  Flu immunization administered today.   Medications reviewed and updated.  Changes include :   pepcid and increased dose of sertraline was sent to pharmacy.    Your prescription(s) have been submitted to your pharmacy. Please take as directed and contact our office if you believe you are having problem(s) with the medication(s).   Please followup in 3 months

## 2018-04-24 NOTE — Progress Notes (Signed)
Subjective:    Patient ID: Kathryn Logan, female    DOB: 05-30-1945, 72 y.o.   MRN: [id number]  HPI The patient is here for follow up.  Diabetes: She is taking her medication daily as prescribed. She is compliant with a diabetic diet. She is not exercising regularly. She checks her feet daily and denies foot lesions. She is up-to-date with an ophthalmology examination.   CAD:  She is taking her medication daily. She is compliant with a low sodium diet.  She denies chest pain, palpitations, edema, shortness of breath and regular headaches. She is not exercising regularly.     Hyperlipidemia: She is taking her medication daily. She is compliant with a low fat/cholesterol diet. She is not exercising regularly. She denies myalgias.   GERD:  She is taking her medication daily as prescribed.  She denies any GERD symptoms and feels her GERD is well controlled.  She does want to change this to pepcid.    Anxiety: She is taking her medication daily as prescribed. She denies any side effects from the medication. She feels her anxiety is not well controlled - she has increased anxiety and wonders if the sertraline can be increased.      Medications and allergies reviewed with patient and updated if appropriate.  Patient Active Problem List   Diagnosis Date Noted  . Fatigue 11/26/2017  . AKI (acute kidney injury) ([place]) 11/26/2017  . Anemia 11/26/2017  . Syncope 11/20/2017  . Compression fracture of L1 lumbar vertebra, closed, initial encounter ([place]) 11/20/2017  . Vitamin D deficiency 02/22/2016  . Anxiety 02/22/2016  . CAD (coronary artery disease) 02/21/2016  . GERD (gastroesophageal reflux disease) 04/14/2015  . Inappropriate sinus tachycardia 03/16/2015  . S/P CABG x 3 12/01/2013  . NSTEMI (non-ST elevated myocardial infarction) ([place]) 11/26/2013  . Type 2 diabetes mellitus with vascular disease ([place]) 08/28/2012  . Hyperlipidemia 12/18/2006  . Osteopenia 10/09/2006    Current  Outpatient Medications on File Prior to Visit  Medication Sig Dispense Refill  . aspirin EC 81 MG tablet Take 81 mg by mouth daily.     [name] BIOTIN PO Take 5,000 mcg by mouth daily.     . bisacodyl (DULCOLAX) 5 MG EC tablet Take 5 mg by mouth 3 (three) times a week.     . cholecalciferol (VITAMIN D) 1000 units tablet Take 1,000 Units by mouth daily.    [name] FARXIGA 5 MG TABS tablet TAKE 1 TABLET EVERY DAY 90 tablet 1  . glucose blood (ONE TOUCH ULTRA TEST) test strip Check blood sugar daily as directed, DX 250.02 100 each 12  . loratadine (CLARITIN) 10 MG tablet Take 1 tablet (10 mg total) by mouth daily. 90 tablet 3  . metFORMIN (GLUCOPHAGE) 500 MG tablet Take 1 tablet (500 mg total) by mouth daily with breakfast. 90 tablet 1  . ONETOUCH DELICA LANCETS FINE MISC 1 each by Does not apply route as directed. Check blood sugar daily as directed, DX 250.02 HOLD UNTIL PATIENT REQUEST 100 each 5  . senna-docusate (SENOKOT-S) 8.6-50 MG tablet Take 1 tablet by mouth at bedtime. 30 tablet 0  . vitamin B-12 (CYANOCOBALAMIN) 1000 MCG tablet Take 1,000 mcg by mouth daily.    . carvedilol (COREG) 3.125 MG tablet Take 1 tablet (3.125 mg total) by mouth 2 (two) times daily. 180 tablet 3  . rosuvastatin (CRESTOR) 40 MG tablet Take 1 tablet (40 mg total) by mouth daily. 90 tablet 1   No current facility-administered  medications on file prior to visit.     Past Medical History:  Diagnosis Date  . Compression fracture of L1 lumbar vertebra ([place]) 11/21/2017  . Diabetes mellitus without complication ([place])   . Gallbladder sludge   . GERD (gastroesophageal reflux disease)   . Heart disease   . Hyperlipidemia   . NSTEMI (non-ST elevated myocardial infarction) ([place]) 11/2013  . Osteopenia   . Syncope 11/2017    Past Surgical History:  Procedure Laterality Date  . CORONARY ARTERY BYPASS GRAFT N/A 12/01/2013   Procedure: CORONARY ARTERY BYPASS GRAFTING (CABG) x three, using left internal mammary artery, and right  leg saphenous vein harvested endoscopically (LIMA to LAD, SVG to OM2 , SVG to dRCA);  Surgeon: Grace Isaac, MD;  Location: [facility];  Service: Open Heart Surgery;  Laterality: N/A;  . G 1 P 1    . INTRAOPERATIVE TRANSESOPHAGEAL ECHOCARDIOGRAM N/A 12/01/2013   Procedure: INTRAOPERATIVE TRANSESOPHAGEAL ECHOCARDIOGRAM;  Surgeon: Grace Isaac, MD;  Location: [facility];  Service: Open Heart Surgery;  Laterality: N/A;  . LEFT HEART CATHETERIZATION WITH CORONARY ANGIOGRAM N/A 11/27/2013   Procedure: LEFT HEART CATHETERIZATION WITH CORONARY ANGIOGRAM;  Surgeon: Sinclair Grooms, MD;  Location: [facility] C[facility] LAB;  Service: Cardiovascular;  Laterality: N/A;  . no colonoscopy     SOC reviewed  . TONSILLECTOMY AND ADENOIDECTOMY      Social History   Socioeconomic History  . Marital status: Married    Spouse name: Not on file  . Number of children: Not on file  . Years of education: Not on file  . Highest education level: Not on file  Occupational History  . Not on file  Social Needs  . Financial resource strain: Not on file  . Food insecurity:    Worry: Not on file    Inability: Not on file  . Transportation needs:    Medical: Not on file    Non-medical: Not on file  Tobacco Use  . Smoking status: Never Smoker  . Smokeless tobacco: Never Used  Substance and Sexual Activity  . Alcohol use: Yes    Comment: Occasionally   . Drug use: No  . Sexual activity: Not on file  Lifestyle  . Physical activity:    Days per week: Not on file    Minutes per session: Not on file  . Stress: Not on file  Relationships  . Social connections:    Talks on phone: Not on file    Gets together: Not on file    Attends religious service: Not on file    Active member of club or organization: Not on file    Attends meetings of clubs or organizations: Not on file    Relationship status: Not on file  Other Topics Concern  . Not on file  Social History Narrative  . Not on file    Family History  Problem  Relation Age of Onset  . Heart attack Mother 79  . Diabetes Mother   . Stroke Mother   . Hypertension Mother   . Goiter Mother   . Heart attack Father        >55  . Bladder Cancer Father   . Parkinson's disease Maternal Aunt   . Osteoporosis Sister   . Diabetes Brother     Review of Systems  Constitutional: Negative for chills and fever.  Respiratory: Negative for cough, shortness of breath and wheezing.   Cardiovascular: Negative for chest pain, palpitations and leg swelling.  Neurological:  Negative for light-headedness and headaches.       Objective:   Vitals:   04/25/18 1029  BP: 132/84  Pulse: 93  Resp: 16  Temp: 98.2 F (36.8 C)  SpO2: 98%   BP Readings from Last 3 Encounters:  04/25/18 132/84  01/31/18 114/70  12/05/17 114/80   Wt Readings from Last 3 Encounters:  04/25/18 127 lb (57.6 kg)  01/31/18 130 lb (59 kg)  12/05/17 129 lb (58.5 kg)   Body mass index is 23.23 kg/m.   Physical Exam    Constitutional: Appears well-developed and well-nourished. No distress.  HENT:  Head: Normocephalic and atraumatic.  Neck: Neck supple. No tracheal deviation present. No thyromegaly present.  No cervical lymphadenopathy Cardiovascular: Normal rate, regular rhythm and normal heart sounds.   No murmur heard. No carotid bruit .  No edema Pulmonary/Chest: Effort normal and breath sounds normal. No respiratory distress. No has no wheezes. No rales.  Skin: Skin is warm and dry. Not diaphoretic.  Psychiatric: Normal mood and affect. Behavior is normal.      Assessment & Plan:     See Problem List for Assessment and Plan of chronic medical problems.

## 2018-04-25 ENCOUNTER — Other Ambulatory Visit (INDEPENDENT_AMBULATORY_CARE_PROVIDER_SITE_OTHER): Payer: Medicare Other

## 2018-04-25 ENCOUNTER — Ambulatory Visit (INDEPENDENT_AMBULATORY_CARE_PROVIDER_SITE_OTHER): Payer: Medicare Other | Admitting: Internal Medicine

## 2018-04-25 ENCOUNTER — Telehealth: Payer: Self-pay | Admitting: *Deleted

## 2018-04-25 ENCOUNTER — Encounter: Payer: Self-pay | Admitting: Internal Medicine

## 2018-04-25 VITALS — BP 132/84 | HR 93 | Temp 98.2°F | Resp 16 | Ht 62.0 in | Wt 127.0 lb

## 2018-04-25 DIAGNOSIS — E1159 Type 2 diabetes mellitus with other circulatory complications: Secondary | ICD-10-CM

## 2018-04-25 DIAGNOSIS — E7849 Other hyperlipidemia: Secondary | ICD-10-CM

## 2018-04-25 DIAGNOSIS — F419 Anxiety disorder, unspecified: Secondary | ICD-10-CM | POA: Diagnosis not present

## 2018-04-25 DIAGNOSIS — K219 Gastro-esophageal reflux disease without esophagitis: Secondary | ICD-10-CM | POA: Diagnosis not present

## 2018-04-25 DIAGNOSIS — R5383 Other fatigue: Secondary | ICD-10-CM | POA: Diagnosis not present

## 2018-04-25 DIAGNOSIS — E559 Vitamin D deficiency, unspecified: Secondary | ICD-10-CM | POA: Diagnosis not present

## 2018-04-25 DIAGNOSIS — I25119 Atherosclerotic heart disease of native coronary artery with unspecified angina pectoris: Secondary | ICD-10-CM

## 2018-04-25 DIAGNOSIS — D649 Anemia, unspecified: Secondary | ICD-10-CM

## 2018-04-25 DIAGNOSIS — Z23 Encounter for immunization: Secondary | ICD-10-CM | POA: Diagnosis not present

## 2018-04-25 LAB — VITAMIN D 25 HYDROXY (VIT D DEFICIENCY, FRACTURES): VITD: 67.47 ng/mL (ref 30.00–100.00)

## 2018-04-25 MED ORDER — SERTRALINE HCL 50 MG PO TABS: 50.0000 mg | ORAL_TABLET | Freq: Every day | ORAL | 1 refills | Status: DC

## 2018-04-25 MED ORDER — FAMOTIDINE 20 MG PO TABS: 20.0000 mg | ORAL_TABLET | Freq: Two times a day (BID) | ORAL | 1 refills | Status: DC

## 2018-04-25 NOTE — Telephone Encounter (Signed)
LVM for patient to call back in regards to scheduling AWV with our health coach.  

## 2018-04-25 NOTE — Assessment & Plan Note (Signed)
Not experiencing chest pain, palpitations and shortness of breath Had blood work done today Up-to-date with cardiology visits Continue aspirin, beta-blocker and statin Continue for [employer]

## 2018-04-25 NOTE — Assessment & Plan Note (Signed)
General anxiety increased-not ideally controlled We will increase sertraline to 50 mg daily

## 2018-04-25 NOTE — Assessment & Plan Note (Signed)
GERD currently controlled, but is using Zantac which has been recalled-we will discontinue Start Pepcid 20 mg twice daily

## 2018-04-25 NOTE — Assessment & Plan Note (Signed)
I appointments up-to-date-we will obtain report Check A1c, urine microalbumin Stressed the importance of regular exercise Discussed the importance of a low sugar/carbohydrate diet Continue current medications-we will adjust medications depending on blood work results May want to change medication in the future due to cost

## 2018-04-25 NOTE — Assessment & Plan Note (Signed)
Check lipid panel  Continue daily statin Regular exercise and healthy diet encouraged  

## 2018-04-26 ENCOUNTER — Telehealth: Payer: Self-pay

## 2018-04-26 LAB — IRON,TIBC AND FERRITIN PANEL
%SAT: 17 % (ref 16–45)
FERRITIN: 113 ng/mL (ref 16–288)
IRON: 54 ug/dL (ref 45–160)
TIBC: 326 ug/dL (ref 250–450)

## 2018-04-26 NOTE — Telephone Encounter (Signed)
Spoke with pt and advised that she needs to come back to have the rest of her labs completed that were entered yesterday. Pt states she will come back next Monday.

## 2018-04-26 NOTE — Telephone Encounter (Signed)
Copied from [place] [id number]. Topic: General - Other >> Apr 26, 2018  9:08 AM Judyann Munson wrote: Reason for CRM: patient is calling to state her husband picked up the call today she received from the office and was unsure who called and requesting a call back. Please advise.

## 2018-04-27 ENCOUNTER — Encounter: Payer: Self-pay | Admitting: Internal Medicine

## 2018-05-10 ENCOUNTER — Ambulatory Visit: Payer: Self-pay

## 2018-05-10 NOTE — Telephone Encounter (Signed)
Patient scheduled.

## 2018-05-10 NOTE — Telephone Encounter (Signed)
Outgoing call to Patient , who complains of Flu like Sx.  Patient states that she is coughing up Phlegm that is yellow.  Onset was  Christmas Eve.  States that cough is not real bad.'Breathing" is fine.  States nose is stopped up.  Denies having a fever.  Except maybe a low grade temp last night. States she had chills.  Did not take her temp.  States she was exposed to children Christmas  Eve who had the Flu.  Patient states she had the Flu vaccination  Two weeks ago.   Patient is a diabetic.   Denies lung disease,  Weak immune dx.  Patient complains of a headache. Patient request Antibiotic be called in to [facility].  Patient  Desires a phone a return  Phone call in relation to request.     Reason for Disposition . [1] Nasal discharge AND [2] present > 10 days  Answer Assessment - Initial Assessment Questions 1. WORST SYMPTOM: "What is your worst symptom?" (e.g., cough, runny nose, muscle aches, headache, sore throat, fever)      Coughing up phelgm and phelgm 2. ONSET: "When did your flu symptoms start?"      Christmas Eve  3. COUGH: "How bad is the cough?"        Not real bad 4. RESPIRATORY DISTRESS: "Describe your breathing."      Nose stopped up 5. FEVER: "Do you have a fever?" If so, ask: "What is your temperature, how was it measured, and when did it start?"     Denies , maybe a low grade  During the night , because  I had chills 6. EXPOSURE: "Were you exposed to someone with influenza?"         Yes , Children  During christmas  7. FLU VACCINE: "Did you get a flu shot this year?"     Yes 2 weeks ago 8. HIGH RISK DISEASE: "Do you any chronic medical problems?" (e.g., heart or lung disease, asthma, weak immune system, or other HIGH RISK conditions)     no 9. PREGNANCY: "Is there any chance you are pregnant?" "When was your last menstrual period?"     na 10. OTHER SYMPTOMS: "Do you have any other symptoms?"  (e.g., runny nose, muscle aches, headache, sore throat)    Throat was sore last night, head feels like it going to explode  Protocols used: INFLUENZA - SEASONAL-A-AH

## 2018-05-10 NOTE — Telephone Encounter (Signed)
Since provider is unavailable patient will need to make an appointment

## 2018-05-11 ENCOUNTER — Ambulatory Visit (INDEPENDENT_AMBULATORY_CARE_PROVIDER_SITE_OTHER): Payer: Medicare Other | Admitting: Family Medicine

## 2018-05-11 ENCOUNTER — Encounter: Payer: Self-pay | Admitting: Family Medicine

## 2018-05-11 VITALS — BP 122/76 | HR 116 | Temp 98.9°F | Resp 16 | Ht 62.0 in | Wt 129.0 lb

## 2018-05-11 DIAGNOSIS — I25119 Atherosclerotic heart disease of native coronary artery with unspecified angina pectoris: Secondary | ICD-10-CM | POA: Diagnosis not present

## 2018-05-11 DIAGNOSIS — J029 Acute pharyngitis, unspecified: Secondary | ICD-10-CM | POA: Diagnosis not present

## 2018-05-11 DIAGNOSIS — J069 Acute upper respiratory infection, unspecified: Secondary | ICD-10-CM | POA: Diagnosis not present

## 2018-05-11 MED ORDER — PROMETHAZINE-DM 6.25-15 MG/5ML PO SYRP: 5.0000 mL | ORAL_SOLUTION | Freq: Four times a day (QID) | ORAL | 0 refills | Status: DC | PRN

## 2018-05-11 MED ORDER — AZITHROMYCIN 250 MG PO TABS: ORAL_TABLET | ORAL | 0 refills | Status: DC

## 2018-05-11 NOTE — Progress Notes (Signed)
Patient ID: Kathryn Logan, female   DOB: 02/12/1946, 72 y.o.   MRN: [id number]  SUBJECTIVE:  Kathryn Logan is a 72 y.o. female ,new to me, who presents to weekend clinic with complaints of coryza, congestion, sore throat and bilateral sinus pain for 4 days. She denies a history of anorexia and chest pain and denies a history of asthma. Patient denies smoke cigarettes.  Took mucinex but stopped taking it yesterday because made her head pain worse.  + chills, no fever.     Current Outpatient Medications on File Prior to Visit  Medication Sig Dispense Refill  . aspirin EC 81 MG tablet Take 81 mg by mouth daily.     [name] BIOTIN PO Take 5,000 mcg by mouth daily.     . bisacodyl (DULCOLAX) 5 MG EC tablet Take 5 mg by mouth 3 (three) times a week.     . cholecalciferol (VITAMIN D) 1000 units tablet Take 1,000 Units by mouth daily.    . famotidine (PEPCID) 20 MG tablet Take 1 tablet (20 mg total) by mouth 2 (two) times daily. 180 tablet 1  . FARXIGA 5 MG TABS tablet TAKE 1 TABLET EVERY DAY 90 tablet 1  . glucose blood (ONE TOUCH ULTRA TEST) test strip Check blood sugar daily as directed, DX 250.02 100 each 12  . loratadine (CLARITIN) 10 MG tablet Take 1 tablet (10 mg total) by mouth daily. 90 tablet 3  . metFORMIN (GLUCOPHAGE) 500 MG tablet Take 1 tablet (500 mg total) by mouth daily with breakfast. 90 tablet 1  . ONETOUCH DELICA LANCETS FINE MISC 1 each by Does not apply route as directed. Check blood sugar daily as directed, DX 250.02 HOLD UNTIL PATIENT REQUEST 100 each 5  . senna-docusate (SENOKOT-S) 8.6-50 MG tablet Take 1 tablet by mouth at bedtime. 30 tablet 0  . sertraline (ZOLOFT) 50 MG tablet Take 1 tablet (50 mg total) by mouth daily. 90 tablet 1  . vitamin B-12 (CYANOCOBALAMIN) 1000 MCG tablet Take 1,000 mcg by mouth daily.    . carvedilol (COREG) 3.125 MG tablet Take 1 tablet (3.125 mg total) by mouth 2 (two) times daily. 180 tablet 3  . rosuvastatin (CRESTOR) 40 MG tablet Take 1  tablet (40 mg total) by mouth daily. 90 tablet 1   No current facility-administered medications on file prior to visit.     No Known Allergies  Past Medical History:  Diagnosis Date  . Compression fracture of L1 lumbar vertebra ([place]) 11/21/2017  . Diabetes mellitus without complication ([place])   . Gallbladder sludge   . GERD (gastroesophageal reflux disease)   . Heart disease   . Hyperlipidemia   . NSTEMI (non-ST elevated myocardial infarction) ([place]) 11/2013  . Osteopenia   . Syncope 11/2017    Past Surgical History:  Procedure Laterality Date  . CORONARY ARTERY BYPASS GRAFT N/A 12/01/2013   Procedure: CORONARY ARTERY BYPASS GRAFTING (CABG) x three, using left internal mammary artery, and right leg saphenous vein harvested endoscopically (LIMA to LAD, SVG to OM2 , SVG to dRCA);  Surgeon: Grace Isaac, MD;  Location: [facility];  Service: Open Heart Surgery;  Laterality: N/A;  . G 1 P 1    . INTRAOPERATIVE TRANSESOPHAGEAL ECHOCARDIOGRAM N/A 12/01/2013   Procedure: INTRAOPERATIVE TRANSESOPHAGEAL ECHOCARDIOGRAM;  Surgeon: Grace Isaac, MD;  Location: [facility];  Service: Open Heart Surgery;  Laterality: N/A;  . LEFT HEART CATHETERIZATION WITH CORONARY ANGIOGRAM N/A 11/27/2013   Procedure: LEFT HEART CATHETERIZATION WITH CORONARY ANGIOGRAM;  Surgeon: Sinclair Grooms, MD;  Location: [facility] CATH LAB;  Service: Cardiovascular;  Laterality: N/A;  . no colonoscopy     SOC reviewed  . TONSILLECTOMY AND ADENOIDECTOMY      Family History  Problem Relation Age of Onset  . Heart attack Mother 69  . Diabetes Mother   . Stroke Mother   . Hypertension Mother   . Goiter Mother   . Heart attack Father        >55  . Bladder Cancer Father   . Parkinson's disease Maternal Aunt   . Osteoporosis Sister   . Diabetes Brother     Social History   Socioeconomic History  . Marital status: Married    Spouse name: Not on file  . Number of children: Not on file  . Years of education: Not on file  .  Highest education level: Not on file  Occupational History  . Not on file  Social Needs  . Financial resource strain: Not on file  . Food insecurity:    Worry: Not on file    Inability: Not on file  . Transportation needs:    Medical: Not on file    Non-medical: Not on file  Tobacco Use  . Smoking status: Never Smoker  . Smokeless tobacco: Never Used  Substance and Sexual Activity  . Alcohol use: Yes    Comment: Occasionally   . Drug use: No  . Sexual activity: Not on file  Lifestyle  . Physical activity:    Days per week: Not on file    Minutes per session: Not on file  . Stress: Not on file  Relationships  . Social connections:    Talks on phone: Not on file    Gets together: Not on file    Attends religious service: Not on file    Active member of club or organization: Not on file    Attends meetings of clubs or organizations: Not on file    Relationship status: Not on file  . Intimate partner violence:    Fear of current or ex partner: Not on file    Emotionally abused: Not on file    Physically abused: Not on file    Forced sexual activity: Not on file  Other Topics Concern  . Not on file  Social History Narrative  . Not on file   The PMH, PSH, Social History, Family History, Medications, and allergies have been reviewed in [facility], and have been updated if relevant.  OBJECTIVE: BP 122/76 (BP Location: Right Arm, Patient Position: Sitting, Cuff Size: Small)   Pulse (!) 116   Temp 98.9 F (37.2 C) (Oral)   Resp 16   Ht 5\' 2"  (1.575 m)   Wt 129 lb (58.5 kg)   SpO2 97%   BMI 23.59 kg/m   She appears well, vital signs are as noted. Ears normal.  Throat and pharynx normal.  Neck supple. No adenopathy in the neck. Nose is congested. Sinuses  tender. The chest is clear, without wheezes or rales.  ASSESSMENT:  sinusitis and bronchitis  PLAN: Given duration and progression of symptoms, will treat for bacterial sinusitis with zpack. Promethazine DM as needed for  cough- discussed sedation precautions.  Symptomatic therapy suggested: push fluids, rest and return office visit prn if symptoms persist or worsen.Call or return to clinic prn if these symptoms worsen or fail to improve as anticipated.

## 2018-05-11 NOTE — Patient Instructions (Signed)
Nice to meet you.  Please take zpack as directed. Drink fluids.  Promethazine DM as needed for cough (can make you sleepy).

## 2018-05-18 ENCOUNTER — Other Ambulatory Visit: Payer: Self-pay | Admitting: Physician Assistant

## 2018-05-30 ENCOUNTER — Encounter: Payer: Self-pay | Admitting: Internal Medicine

## 2018-05-30 NOTE — Progress Notes (Signed)
Abstracted and sent to scan  

## 2018-07-12 ENCOUNTER — Other Ambulatory Visit: Payer: Self-pay | Admitting: Physician Assistant

## 2018-07-25 ENCOUNTER — Ambulatory Visit: Payer: Medicare Other | Admitting: Internal Medicine

## 2018-08-13 ENCOUNTER — Ambulatory Visit: Payer: Medicare Other | Admitting: Internal Medicine

## 2018-09-03 ENCOUNTER — Ambulatory Visit: Payer: Medicare Other | Admitting: Internal Medicine

## 2018-10-22 ENCOUNTER — Ambulatory Visit: Payer: Medicare Other | Admitting: Internal Medicine

## 2018-10-28 ENCOUNTER — Telehealth: Payer: Self-pay | Admitting: Internal Medicine

## 2018-10-28 NOTE — Telephone Encounter (Signed)
She is very overdue for blood work-I did see her last December and I think she was supposed to have blood work done at that time but did not so her last blood work was from July of last year and her sugars were very uncontrolled.  She should not be having any juice with any sugar.  She does have a follow-up with me next month and we will need to do blood work at that time.  The metformin that is of concern is the extended release metformin only and she is not on that.  It is safe for her to continue her current metformin.

## 2018-10-28 NOTE — Telephone Encounter (Signed)
Pt aware of response and expressed understanding.  

## 2018-10-28 NOTE — Telephone Encounter (Signed)
Please advise 

## 2018-10-28 NOTE — Telephone Encounter (Signed)
CRM for notification. See Telephone encounter for: 10/28/18.  Pt called stating she read metformin can cause cancer. This concerns her and she is asking if perhaps she should change medication. Pt would like to discuss with Dr. Quay Burow or Lovena Le. Pt had a pharmacy change to [facility] on [address] in [place].  Pt also states that she watches her sugar/sodium intake. She would like advise on what to drink. She prefers [employer] containing no sodium but 33g of sugar. She does dilute this 1/2 juice and 1/2 water. She has also looked at [facility] with contains no sugar but has 120mg  of sodium (also dilutes 1/2 & 1/2). Please advise on which would be better for her.

## 2018-10-30 ENCOUNTER — Other Ambulatory Visit: Payer: Self-pay | Admitting: Internal Medicine

## 2018-11-29 ENCOUNTER — Ambulatory Visit: Payer: Medicare Other | Admitting: Internal Medicine

## 2018-12-13 ENCOUNTER — Ambulatory Visit: Payer: Medicare Other | Admitting: Internal Medicine

## 2019-01-01 ENCOUNTER — Other Ambulatory Visit (INDEPENDENT_AMBULATORY_CARE_PROVIDER_SITE_OTHER): Payer: Medicare Other

## 2019-01-01 DIAGNOSIS — K219 Gastro-esophageal reflux disease without esophagitis: Secondary | ICD-10-CM | POA: Diagnosis not present

## 2019-01-01 DIAGNOSIS — E7849 Other hyperlipidemia: Secondary | ICD-10-CM | POA: Diagnosis not present

## 2019-01-01 DIAGNOSIS — I25119 Atherosclerotic heart disease of native coronary artery with unspecified angina pectoris: Secondary | ICD-10-CM

## 2019-01-01 DIAGNOSIS — E1159 Type 2 diabetes mellitus with other circulatory complications: Secondary | ICD-10-CM

## 2019-01-01 LAB — CBC WITH DIFFERENTIAL/PLATELET
Basophils Absolute: 0.1 10*3/uL (ref 0.0–0.1)
Basophils Relative: 1.1 % (ref 0.0–3.0)
Eosinophils Absolute: 0.3 10*3/uL (ref 0.0–0.7)
Eosinophils Relative: 3.6 % (ref 0.0–5.0)
HCT: 35.2 % — ABNORMAL LOW (ref 36.0–46.0)
Hemoglobin: 12 g/dL (ref 12.0–15.0)
Lymphocytes Relative: 33.2 % (ref 12.0–46.0)
Lymphs Abs: 2.9 10*3/uL (ref 0.7–4.0)
MCHC: 34.2 g/dL (ref 30.0–36.0)
MCV: 83.2 fl (ref 78.0–100.0)
Monocytes Absolute: 0.5 10*3/uL (ref 0.1–1.0)
Monocytes Relative: 5.4 % (ref 3.0–12.0)
Neutro Abs: 5 10*3/uL (ref 1.4–7.7)
Neutrophils Relative %: 56.7 % (ref 43.0–77.0)
Platelets: 286 10*3/uL (ref 150.0–400.0)
RBC: 4.24 Mil/uL (ref 3.87–5.11)
RDW: 17.4 % — ABNORMAL HIGH (ref 11.5–15.5)
WBC: 8.9 10*3/uL (ref 4.0–10.5)

## 2019-01-01 LAB — LIPID PANEL
Cholesterol: 111 mg/dL (ref 0–200)
HDL: 31.3 mg/dL — ABNORMAL LOW (ref >39.00)
LDL Cholesterol: 55 mg/dL (ref 0–99)
NonHDL: 80.08
Total CHOL/HDL Ratio: 4
Triglycerides: 127 mg/dL (ref 0.0–149.0)
VLDL: 25.4 mg/dL (ref 0.0–40.0)

## 2019-01-01 LAB — COMPREHENSIVE METABOLIC PANEL
ALT: 10 U/L (ref 0–35)
AST: 13 U/L (ref 0–37)
Albumin: 4.2 g/dL (ref 3.5–5.2)
Alkaline Phosphatase: 76 U/L (ref 39–117)
BUN: 19 mg/dL (ref 6–23)
CO2: 20 mEq/L (ref 19–32)
Calcium: 10.4 mg/dL (ref 8.4–10.5)
Chloride: 108 mEq/L (ref 96–112)
Creatinine, Ser: 0.95 mg/dL (ref 0.40–1.20)
GFR: 57.88 mL/min — ABNORMAL LOW (ref >60.00)
Glucose, Bld: 170 mg/dL — ABNORMAL HIGH (ref 70–99)
Potassium: 4.4 mEq/L (ref 3.5–5.1)
Sodium: 140 mEq/L (ref 135–145)
Total Bilirubin: 0.6 mg/dL (ref 0.2–1.2)
Total Protein: 7.2 g/dL (ref 6.0–8.3)

## 2019-01-01 LAB — HEMOGLOBIN A1C: Hgb A1c MFr Bld: 12.9 % — ABNORMAL HIGH (ref 4.6–6.5)

## 2019-01-01 NOTE — Progress Notes (Signed)
Subjective:    Patient ID: Kathryn Logan, female    DOB: 04-16-1946, 73 y.o.   MRN: [id number]  HPI The patient is here for follow up.  She is not exercising regularly.    Diabetes: She is taking her medication daily as prescribed. She is compliant with a diabetic diet.  She denies numbness/tingling in her feet and foot lesions. She is up-to-date with an ophthalmology examination.   CAD:  She is taking her medication daily. She is compliant with a low sodium diet.  She denies chest pain, palpitations, edema, shortness of breath and regular headaches.  Hyperlipidemia: She is taking her medication daily. She is compliant with a low fat/cholesterol diet. She denies myalgias.   GERD:  She is taking her medication daily as prescribed.  She denies any GERD symptoms and feels her GERD is well controlled.   Anxiety: She is taking her medication daily as prescribed. She denies any side effects from the medication. She feels her anxiety is well controlled and she is happy with her current dose of medication.       Medications and allergies reviewed with patient and updated if appropriate.  Patient Active Problem List   Diagnosis Date Noted  . AKI (acute kidney injury) ([place]) 11/26/2017  . Anemia 11/26/2017  . Syncope 11/20/2017  . Compression fracture of L1 lumbar vertebra, closed, initial encounter ([place]) 11/20/2017  . Vitamin D deficiency 02/22/2016  . Anxiety 02/22/2016  . CAD (coronary artery disease) 02/21/2016  . GERD (gastroesophageal reflux disease) 04/14/2015  . Inappropriate sinus tachycardia 03/16/2015  . S/P CABG x 3 12/01/2013  . NSTEMI (non-ST elevated myocardial infarction) ([place]) 11/26/2013  . Type 2 diabetes mellitus with vascular disease ([place]) 08/28/2012  . Hyperlipidemia 12/18/2006  . Osteopenia 10/09/2006    Current Outpatient Medications on File Prior to Visit  Medication Sig Dispense Refill  . aspirin EC 81 MG tablet Take 81 mg by mouth daily.     [name] BIOTIN  PO Take 5,000 mcg by mouth daily.     . bisacodyl (DULCOLAX) 5 MG EC tablet Take 5 mg by mouth 3 (three) times a week.     . cholecalciferol (VITAMIN D) 1000 units tablet Take 1,000 Units by mouth daily.    . famotidine (PEPCID) 20 MG tablet Take 1 tablet by mouth twice daily 180 tablet 0  . FARXIGA 5 MG TABS tablet TAKE 1 TABLET EVERY DAY 90 tablet 1  . glucose blood (ONE TOUCH ULTRA TEST) test strip Check blood sugar daily as directed, DX 250.02 100 each 12  . loratadine (CLARITIN) 10 MG tablet Take 1 tablet (10 mg total) by mouth daily. 90 tablet 3  . metFORMIN (GLUCOPHAGE) 500 MG tablet Take 1 tablet (500 mg total) by mouth daily with breakfast. 90 tablet 1  . ONETOUCH DELICA LANCETS FINE MISC 1 each by Does not apply route as directed. Check blood sugar daily as directed, DX 250.02 HOLD UNTIL PATIENT REQUEST 100 each 5  . rosuvastatin (CRESTOR) 40 MG tablet TAKE 1 TABLET BY MOUTH AT BEDTIME 90 tablet 2  . senna-docusate (SENOKOT-S) 8.6-50 MG tablet Take 1 tablet by mouth at bedtime. 30 tablet 0  . sertraline (ZOLOFT) 50 MG tablet Take 1 tablet (50 mg total) by mouth daily. 90 tablet 1  . vitamin B-12 (CYANOCOBALAMIN) 1000 MCG tablet Take 1,000 mcg by mouth daily.    . carvedilol (COREG) 3.125 MG tablet Take 1 tablet (3.125 mg total) by mouth 2 (two) times daily.  180 tablet 3   No current facility-administered medications on file prior to visit.     Past Medical History:  Diagnosis Date  . Compression fracture of L1 lumbar vertebra ([place]) 11/21/2017  . Diabetes mellitus without complication ([place])   . Gallbladder sludge   . GERD (gastroesophageal reflux disease)   . Heart disease   . Hyperlipidemia   . NSTEMI (non-ST elevated myocardial infarction) ([place]) 11/2013  . Osteopenia   . Syncope 11/2017    Past Surgical History:  Procedure Laterality Date  . CORONARY ARTERY BYPASS GRAFT N/A 12/01/2013   Procedure: CORONARY ARTERY BYPASS GRAFTING (CABG) x three, using left internal mammary  artery, and right leg saphenous vein harvested endoscopically (LIMA to LAD, SVG to OM2 , SVG to dRCA);  Surgeon: Grace Isaac, MD;  Location: [facility];  Service: Open Heart Surgery;  Laterality: N/A;  . G 1 P 1    . INTRAOPERATIVE TRANSESOPHAGEAL ECHOCARDIOGRAM N/A 12/01/2013   Procedure: INTRAOPERATIVE TRANSESOPHAGEAL ECHOCARDIOGRAM;  Surgeon: Grace Isaac, MD;  Location: [facility];  Service: Open Heart Surgery;  Laterality: N/A;  . LEFT HEART CATHETERIZATION WITH CORONARY ANGIOGRAM N/A 11/27/2013   Procedure: LEFT HEART CATHETERIZATION WITH CORONARY ANGIOGRAM;  Surgeon: Sinclair Grooms, MD;  Location: [facility] CATH LAB;  Service: Cardiovascular;  Laterality: N/A;  . no colonoscopy     SOC reviewed  . TONSILLECTOMY AND ADENOIDECTOMY      Social History   Socioeconomic History  . Marital status: Married    Spouse name: Not on file  . Number of children: Not on file  . Years of education: Not on file  . Highest education level: Not on file  Occupational History  . Not on file  Social Needs  . Financial resource strain: Not on file  . Food insecurity    Worry: Not on file    Inability: Not on file  . Transportation needs    Medical: Not on file    Non-medical: Not on file  Tobacco Use  . Smoking status: Never Smoker  . Smokeless tobacco: Never Used  Substance and Sexual Activity  . Alcohol use: Yes    Comment: Occasionally   . Drug use: No  . Sexual activity: Not on file  Lifestyle  . Physical activity    Days per week: Not on file    Minutes per session: Not on file  . Stress: Not on file  Relationships  . Social Herbalist on phone: Not on file    Gets together: Not on file    Attends religious service: Not on file    Active member of club or organization: Not on file    Attends meetings of clubs or organizations: Not on file    Relationship status: Not on file  Other Topics Concern  . Not on file  Social History Narrative  . Not on file    Family  History  Problem Relation Age of Onset  . Heart attack Mother 66  . Diabetes Mother   . Stroke Mother   . Hypertension Mother   . Goiter Mother   . Heart attack Father        >55  . Bladder Cancer Father   . Parkinson's disease Maternal Aunt   . Osteoporosis Sister   . Diabetes Brother     Review of Systems  Constitutional: Negative for chills and fever.  Respiratory: Negative for cough, shortness of breath and wheezing.   Cardiovascular: Negative for  chest pain, palpitations and leg swelling.  Gastrointestinal: Negative for abdominal pain and nausea.       Controlled gerd  Neurological: Negative for light-headedness, numbness and headaches.       Objective:   Vitals:   01/02/19 0852  BP: 118/72  Pulse: 79  Temp: 98.1 F (36.7 C)  SpO2: 97%   BP Readings from Last 3 Encounters:  01/02/19 118/72  05/11/18 122/76  04/25/18 132/84   Wt Readings from Last 3 Encounters:  01/02/19 123 lb 12.8 oz (56.2 kg)  05/11/18 129 lb (58.5 kg)  04/25/18 127 lb (57.6 kg)   Body mass index is 22.64 kg/m.   Physical Exam    Constitutional: Appears well-developed and well-nourished. No distress.  HENT:  Head: Normocephalic and atraumatic.  Neck: Neck supple. No tracheal deviation present. No thyromegaly present.  No cervical lymphadenopathy Cardiovascular: Normal rate, regular rhythm and normal heart sounds.   No murmur heard. No carotid bruit .  No edema Pulmonary/Chest: Effort normal and breath sounds normal. No respiratory distress. No has no wheezes. No rales.  Skin: Skin is warm and dry. Not diaphoretic.  Psychiatric: Normal mood and affect. Behavior is normal.    Diabetic Foot Exam - Simple   Simple Foot Form Diabetic Foot exam was performed with the following findings: Yes 01/02/2019  9:19 AM  Visual Inspection No deformities, no ulcerations, no other skin breakdown bilaterally: Yes Sensation Testing Intact to touch and monofilament testing bilaterally: Yes Pulse  Check Posterior Tibialis and Dorsalis pulse intact bilaterally: Yes Comments      Assessment & Plan:    Hm - she will schedule mammo and dexa when things calm done from covid  Still debating about colonoscopy.  Discussed cologuard  Agrees to hep c - will order for next viist   See Problem List for Assessment and Plan of chronic medical problems.

## 2019-01-01 NOTE — Patient Instructions (Addendum)
We will call you with your results.   All other Health Maintenance issues reviewed.   All recommended immunizations and age-appropriate screenings are up-to-date or discussed.  No immunization administered today.   Medications reviewed and updated.  Changes include :   none    Please followup in 6 months

## 2019-01-02 ENCOUNTER — Ambulatory Visit (INDEPENDENT_AMBULATORY_CARE_PROVIDER_SITE_OTHER): Payer: Medicare Other | Admitting: Internal Medicine

## 2019-01-02 ENCOUNTER — Encounter: Payer: Self-pay | Admitting: Internal Medicine

## 2019-01-02 ENCOUNTER — Other Ambulatory Visit: Payer: Self-pay

## 2019-01-02 VITALS — BP 118/72 | HR 79 | Temp 98.1°F | Ht 62.0 in | Wt 123.8 lb

## 2019-01-02 DIAGNOSIS — Z1159 Encounter for screening for other viral diseases: Secondary | ICD-10-CM | POA: Diagnosis not present

## 2019-01-02 DIAGNOSIS — E7849 Other hyperlipidemia: Secondary | ICD-10-CM

## 2019-01-02 DIAGNOSIS — Z1211 Encounter for screening for malignant neoplasm of colon: Secondary | ICD-10-CM | POA: Diagnosis not present

## 2019-01-02 DIAGNOSIS — I25119 Atherosclerotic heart disease of native coronary artery with unspecified angina pectoris: Secondary | ICD-10-CM

## 2019-01-02 DIAGNOSIS — E1159 Type 2 diabetes mellitus with other circulatory complications: Secondary | ICD-10-CM | POA: Diagnosis not present

## 2019-01-02 DIAGNOSIS — K219 Gastro-esophageal reflux disease without esophagitis: Secondary | ICD-10-CM

## 2019-01-02 DIAGNOSIS — F419 Anxiety disorder, unspecified: Secondary | ICD-10-CM

## 2019-01-02 DIAGNOSIS — E559 Vitamin D deficiency, unspecified: Secondary | ICD-10-CM

## 2019-01-02 NOTE — Assessment & Plan Note (Signed)
Taking vitamin daily 6 months ago level was good - can recheck in 6 months

## 2019-01-02 NOTE — Assessment & Plan Note (Signed)
GERD controlled Continue daily medication  

## 2019-01-02 NOTE — Assessment & Plan Note (Signed)
Controlled, stable Continue current dose of medication  

## 2019-01-02 NOTE — Assessment & Plan Note (Signed)
Lab Results  Component Value Date   HGBA1C 10.3 (H) 11/26/2017   Not checking sugars at home Taking medication Not exercising  a1c pending - will adjust medication as needed

## 2019-01-02 NOTE — Assessment & Plan Note (Signed)
Check lipid panel, tsh, cmp  Continue daily statin Regular exercise and healthy diet encouraged  

## 2019-01-02 NOTE — Assessment & Plan Note (Signed)
No chest pain, palps or sob Encouraged regular walking Continue current medication Lab pending

## 2019-01-03 IMAGING — CT CT L SPINE W/O CM
3 of 4 series · 13 of 33 positions shown, 16 images · non-contrast
Comparison: 11/20/2017

CLINICAL DATA: L1 compression fracture. Low back pain.

EXAM:
CT LUMBAR SPINE WITHOUT CONTRAST
TECHNIQUE: Multidetector CT imaging of the lumbar spine was performed without
intravenous contrast administration. Multiplanar CT image
reconstructions were also generated.

[Series 3: l-spine 2.00 br40 s3 lspine st · axial · 0.31mm/px · z∈[+1316,+1444]mm · 5 of 98 slices shown, 7 images]
[im 17/98  soft-tissue]
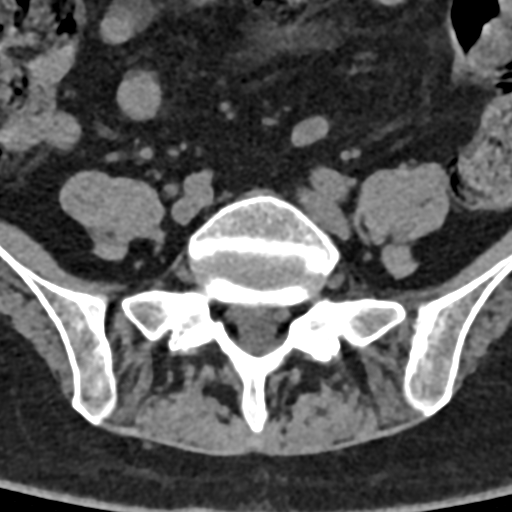
[im 17/98  bone]
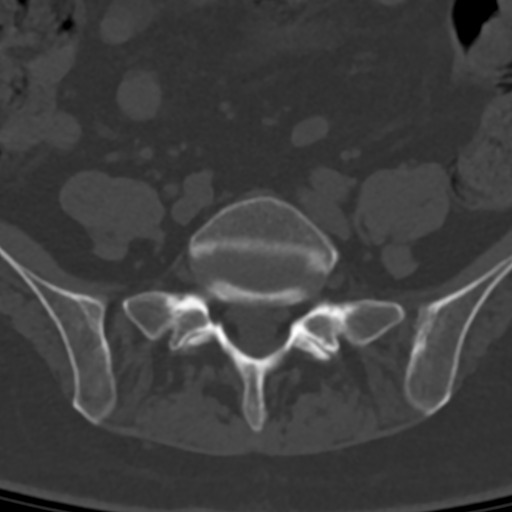
[im 33/98  bone]
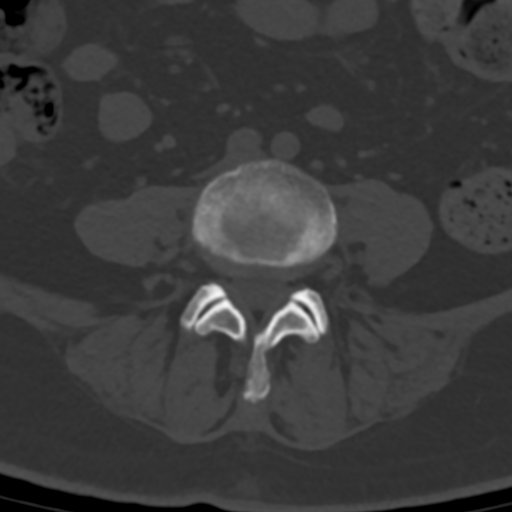
[im 49/98  bone]
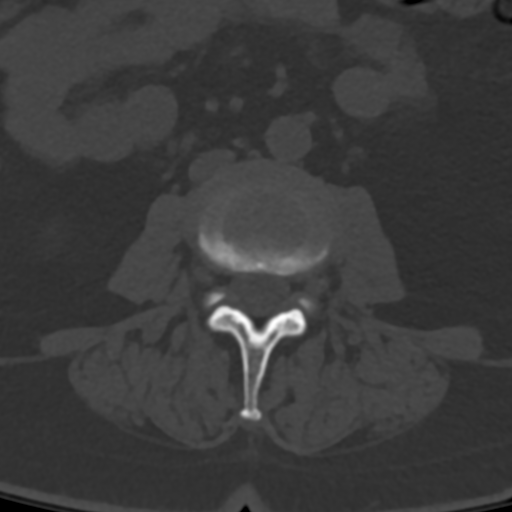
[im 65/98  bone]
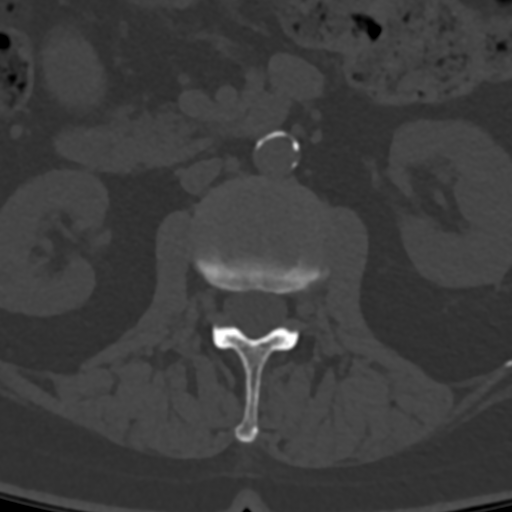
[im 81/98  soft-tissue]
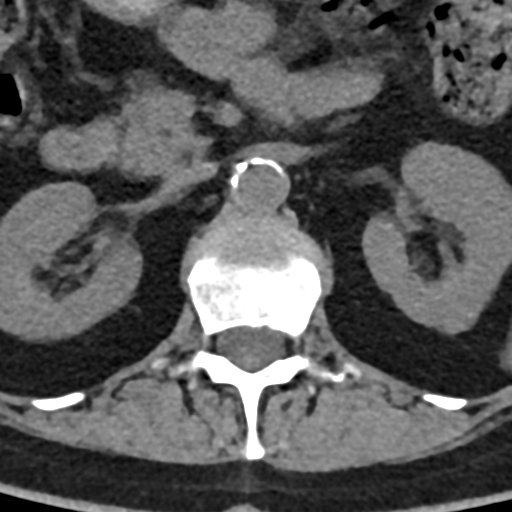
[im 81/98  bone]
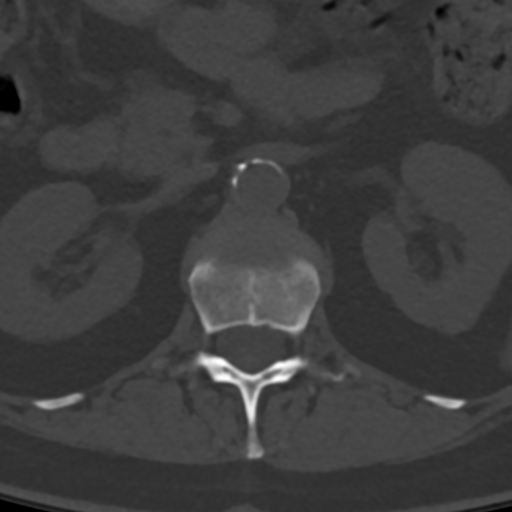

[Series 5: l-spine 2.00 br60 s3 sag sag bone · sagittal · 0.31mm/px · 5 of 79 slices shown, 6 images]
[im 27/79  bone]
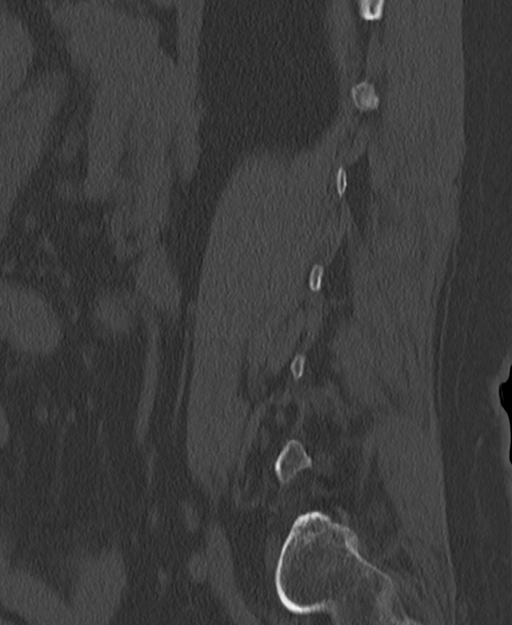
[im 33/79  bone]
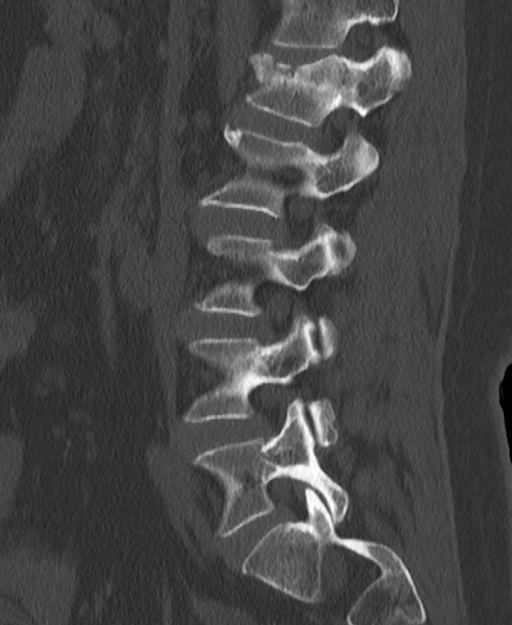
[im 40/79  soft-tissue]
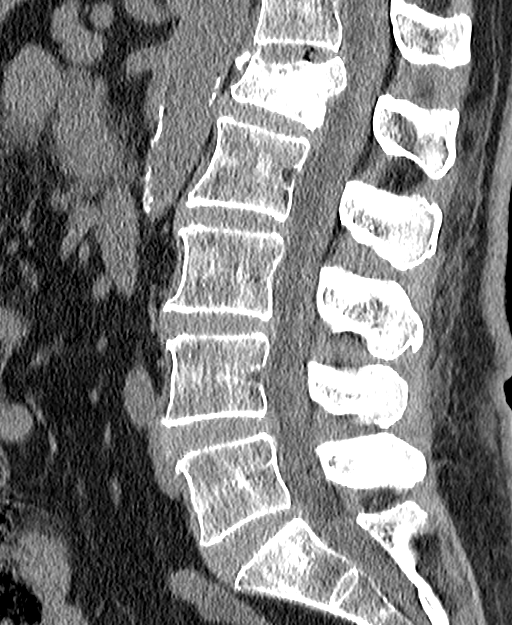
[im 40/79  bone]
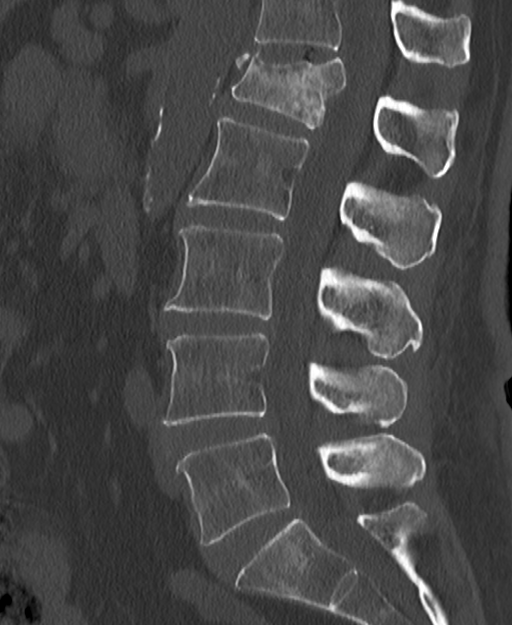
[im 46/79  bone]
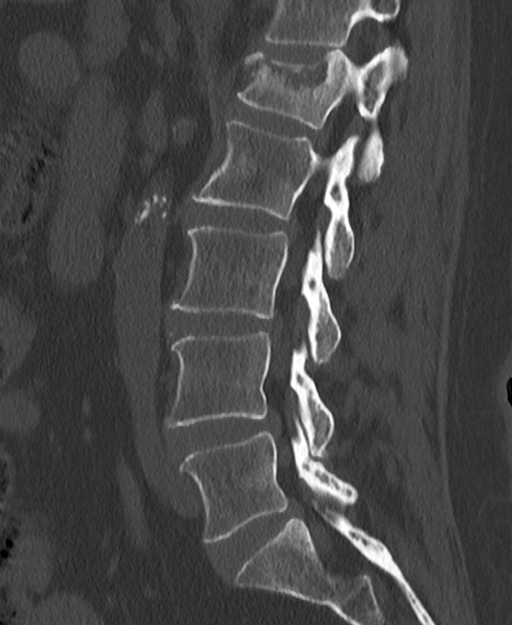
[im 53/79  bone]
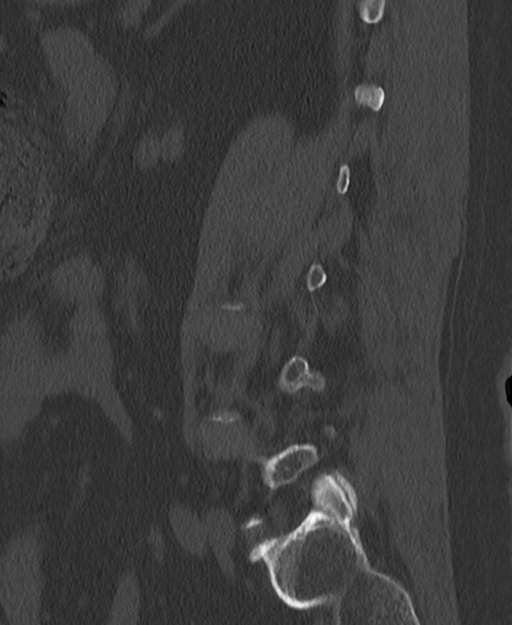

[Series 7: l-spine 2.00 br60 s3 cor cor bone · coronal · 0.31mm/px · 3 of 79 slices shown]
[im 16/79  bone]
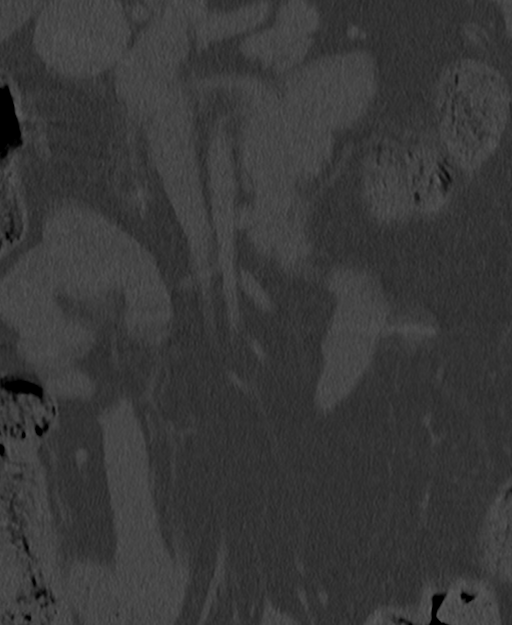
[im 32/79  bone]
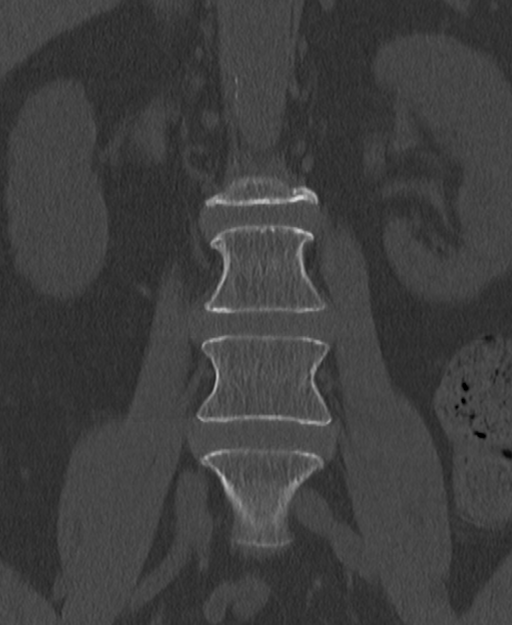
[im 47/79  bone]
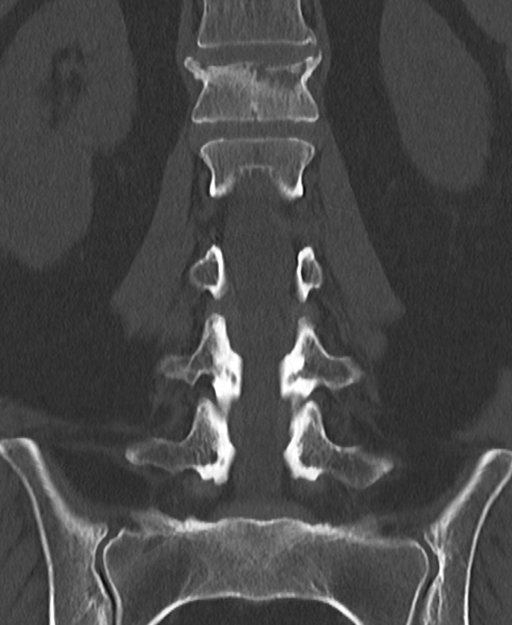

[13 of 33 positions shown; findings below may reference images not displayed]

FINDINGS: Segmentation: 5 lumbar type vertebrae.

Alignment: Normal.

Vertebrae: L1 fracture demonstrates minimally increased anterior
vertebral body height loss, now 40%. The fracture line through the
superior endplate remains readily visible with increased sclerosis
along its margins. Retropulsion of the superior L1 vertebral body by
4-5 mm is similar to the prior study.

Paraspinal and other soft tissues: Abdominal aortic atherosclerosis
without aneurysm.

Disc levels:

T12-L1: Mild spinal stenosis due to L1 retropulsion, unchanged.

L1-2: Small left paracentral/subarticular disc protrusion results in
mild left lateral recess stenosis without spinal or neural foraminal
stenosis, unchanged.

L2-3: Mild disc space narrowing. Mild disc bulging without
significant stenosis, unchanged.

L3-4: Mild disc space narrowing. Mild disc bulging results in
borderline left neural foraminal stenosis without spinal stenosis,
unchanged.

L4-5: Mild disc bulging and mild facet and ligamentum flavum
hypertrophy result in mild spinal stenosis without neural foraminal
stenosis, unchanged.

L5-S1: Mild disc bulging and mild asymmetric left facet hypertrophy
without stenosis, unchanged.
IMPRESSION: 1. Subacute L1 fracture with minimally increased vertebral body
height loss, now 40%. Persistently visible fracture line with
increased surrounding sclerosis. Unchanged mild spinal stenosis due
to retropulsion.
2. Unchanged mild multilevel disc degeneration.
3. Aortic Atherosclerosis ([id number]).

## 2019-01-07 MED ORDER — FARXIGA 10 MG PO TABS: 10.0000 mg | ORAL_TABLET | Freq: Every day | ORAL | 1 refills | Status: DC

## 2019-01-07 MED ORDER — SERTRALINE HCL 50 MG PO TABS: 50.0000 mg | ORAL_TABLET | Freq: Every day | ORAL | 1 refills | Status: DC

## 2019-01-15 ENCOUNTER — Encounter: Payer: Self-pay | Admitting: Internal Medicine

## 2019-01-22 ENCOUNTER — Other Ambulatory Visit: Payer: Self-pay

## 2019-01-22 ENCOUNTER — Ambulatory Visit (INDEPENDENT_AMBULATORY_CARE_PROVIDER_SITE_OTHER): Payer: Medicare Other

## 2019-01-22 DIAGNOSIS — Z23 Encounter for immunization: Secondary | ICD-10-CM

## 2019-02-24 ENCOUNTER — Telehealth: Payer: Self-pay | Admitting: Internal Medicine

## 2019-02-24 NOTE — Telephone Encounter (Signed)
Just the 500 XR are recalled that you know of right?

## 2019-02-24 NOTE — Telephone Encounter (Signed)
Pt wants Lovena Le to talk to Dr Quay Burow, she has stopped taking her metFORMIN (GLUCOPHAGE) 500 MG tablet  Due to seeing something on the news if you take for diabetes to call your dr. Frances Furbish FU with pt at [phone]

## 2019-02-24 NOTE — Telephone Encounter (Signed)
Spoke with pt and clarified medication. Pt understood.

## 2019-02-24 NOTE — Telephone Encounter (Signed)
She is not on the XR metformin -- she is regular metformin, which was not recalled

## 2019-04-15 ENCOUNTER — Other Ambulatory Visit: Payer: Self-pay | Admitting: Internal Medicine

## 2019-04-15 ENCOUNTER — Other Ambulatory Visit: Payer: Self-pay | Admitting: Physician Assistant

## 2019-04-15 MED ORDER — CARVEDILOL 3.125 MG PO TABS: 3.1250 mg | ORAL_TABLET | Freq: Two times a day (BID) | ORAL | 0 refills | Status: DC

## 2019-04-18 ENCOUNTER — Telehealth: Payer: Self-pay | Admitting: Internal Medicine

## 2019-04-18 MED ORDER — CARVEDILOL 3.125 MG PO TABS: 3.1250 mg | ORAL_TABLET | Freq: Two times a day (BID) | ORAL | 0 refills | Status: DC

## 2019-04-18 NOTE — Telephone Encounter (Signed)
New Message  Pt c/o medication issue:  1. Name of Medication:carvedilol (COREG) 3.125 MG tablet   2. How are you currently taking this medication (dosage and times per day)? Take 1 tablet (3.125 mg total) by mouth 2 (two) times daily  3. Are you having a reaction (difficulty breathing--STAT)? No   4. What is your medication issue? Out of medication; needs new prescription

## 2019-04-18 NOTE — Telephone Encounter (Signed)
SPOKE TO PATIENT . Medication refilled. Aware  Only for 3 months until  Upcoming appt in Jan 2021

## 2019-04-25 ENCOUNTER — Telehealth: Payer: Self-pay | Admitting: *Deleted

## 2019-04-25 MED ORDER — FARXIGA 10 MG PO TABS: 10.0000 mg | ORAL_TABLET | Freq: Every day | ORAL | 1 refills | Status: DC

## 2019-04-25 NOTE — Telephone Encounter (Signed)
Copied from [place] [id number]. Topic: General - Inquiry >> Apr 25, 2019 11:48 AM Richardo Priest, NT wrote: Reason for CRM: Pt called in stating she would like to speak with Lovena Le in regards to a prescription of FARXIGA 5 MG TABS tablet. Pt states she takes 10 and is unaware why the change was made. Please advise.

## 2019-04-25 NOTE — Telephone Encounter (Signed)
She should be taking 10 mg daily.  That is what is in her chart and what we increased it to.  Not sure why 5 mg was sent in.  That may have been the dose the pharmacy had on file and maybe they filled the wrong prescription-I am unsure.

## 2019-04-25 NOTE — Telephone Encounter (Signed)
Pt informed of below. New rx sent. See meds.

## 2019-04-25 NOTE — Telephone Encounter (Signed)
I see the Farxiga 5 mg was sent on 04/16/19, but do not see any notes about why the dose was changed. Was this intentional?

## 2019-05-29 ENCOUNTER — Ambulatory Visit: Payer: Medicare Other | Admitting: Physician Assistant

## 2019-06-03 ENCOUNTER — Other Ambulatory Visit: Payer: Self-pay | Admitting: Physician Assistant

## 2019-06-04 NOTE — Telephone Encounter (Signed)
This is Dr. Hilty's pt 

## 2019-06-13 ENCOUNTER — Ambulatory Visit: Payer: Medicare Other

## 2019-06-16 ENCOUNTER — Telehealth: Payer: Self-pay | Admitting: Internal Medicine

## 2019-06-16 NOTE — Telephone Encounter (Signed)
Husband is aware and will relay message to wife.

## 2019-06-16 NOTE — Telephone Encounter (Signed)
    Patient calling to discuss morning medications may be causing nausea.  Patient states famotidine (PEPCID) 20 MG tablet may be too strong to take twice a day  Please advise

## 2019-06-16 NOTE — Telephone Encounter (Signed)
She can try once a day and see if that helps.

## 2019-06-16 NOTE — Telephone Encounter (Signed)
Do you want her to try decreasing?

## 2019-06-18 ENCOUNTER — Ambulatory Visit: Payer: Medicare Other

## 2019-06-19 ENCOUNTER — Ambulatory Visit: Payer: Medicare Other

## 2019-06-19 ENCOUNTER — Ambulatory Visit: Payer: Medicare Other | Attending: Internal Medicine

## 2019-06-19 DIAGNOSIS — Z23 Encounter for immunization: Secondary | ICD-10-CM

## 2019-06-19 NOTE — Progress Notes (Signed)
   Covid-19 Vaccination Clinic  Name:  Kathryn Logan    MRN: [id number] DOB: Dec 18, 1945  06/19/2019  [name] was observed post Covid-19 immunization for 15 minutes without incidence. She was provided with Vaccine Information Sheet and instruction to access the V-Safe system.   [name] was instructed to call 911 with any severe reactions post vaccine: [name] Difficulty breathing  . Swelling of your face and throat  . A fast heartbeat  . A bad rash all over your body  . Dizziness and weakness    Immunizations Administered    Name Date Dose VIS Date Route   Pfizer COVID-19 Vaccine 06/19/2019  4:58 PM 0.3 mL 04/25/2019 Intramuscular   Manufacturer: [place]   Lot: [id number]   [place]: [id number]

## 2019-06-25 ENCOUNTER — Telehealth: Payer: Self-pay

## 2019-06-25 NOTE — Telephone Encounter (Signed)
LVM for pt to call back. She needs an appointment as soon as possible.

## 2019-06-25 NOTE — Telephone Encounter (Signed)
New message    The patient wants to talk with the nurse regarding her blood sugar.   Has a new glucose meter last reading saying  " HIGH" no number was present.   This morning reading 278.

## 2019-06-25 NOTE — Telephone Encounter (Signed)
Appointment moved up to 2/16.

## 2019-06-26 ENCOUNTER — Ambulatory Visit: Payer: Medicare Other | Admitting: Internal Medicine

## 2019-07-01 ENCOUNTER — Ambulatory Visit: Payer: Medicare Other | Admitting: Internal Medicine

## 2019-07-09 ENCOUNTER — Ambulatory Visit: Payer: Medicare Other | Admitting: Internal Medicine

## 2019-07-11 ENCOUNTER — Ambulatory Visit: Payer: Medicare Other | Admitting: Internal Medicine

## 2019-07-16 ENCOUNTER — Encounter: Payer: Self-pay | Admitting: Internal Medicine

## 2019-07-16 ENCOUNTER — Ambulatory Visit: Payer: Medicare Other | Attending: Internal Medicine

## 2019-07-16 DIAGNOSIS — Z23 Encounter for immunization: Secondary | ICD-10-CM | POA: Insufficient documentation

## 2019-07-16 NOTE — Progress Notes (Signed)
   Covid-19 Vaccination Clinic  Name:  Kathryn Logan    MRN: [id number] DOB: 07-17-1945  07/16/2019  Kathryn Logan was observed post Covid-19 immunization for 15 minutes without incident. She was provided with Vaccine Information Sheet and instruction to access the V-Safe system.   Kathryn Logan was instructed to call 911 with any severe reactions post vaccine: [name] Difficulty breathing  . Swelling of face and throat  . A fast heartbeat  . A bad rash all over body  . Dizziness and weakness   Immunizations Administered    Name Date Dose VIS Date Route   Pfizer COVID-19 Vaccine 07/16/2019  8:56 AM 0.3 mL 04/25/2019 Intramuscular   Manufacturer: [place]   Lot: [id number]   [place]: [id number]

## 2019-07-16 NOTE — Progress Notes (Deleted)
Subjective:    Patient ID: Kathryn Logan, female    DOB: [birth year], 74 y.o.   MRN: [id number]  HPI The patient is here for follow up of their chronic medical problems, including diabetes, CAD, hyperlipidemia, GERD, Anxiety.  She is taking all of her medications as prescribed.   She is exercising regularly.         Medications and allergies reviewed with patient and updated if appropriate.  Patient Active Problem List   Diagnosis Date Noted  . AKI (acute kidney injury) ([place]) 11/26/2017  . Anemia 11/26/2017  . Syncope 11/20/2017  . Compression fracture of L1 lumbar vertebra, closed, initial encounter ([place]) 11/20/2017  . Vitamin D deficiency 02/22/2016  . Anxiety 02/22/2016  . CAD (coronary artery disease) 02/21/2016  . GERD (gastroesophageal reflux disease) 04/14/2015  . S/P CABG x 3 12/01/2013  . NSTEMI (non-ST elevated myocardial infarction) ([place]) 11/26/2013  . Type 2 diabetes mellitus with vascular disease ([place]) 08/28/2012  . Hyperlipidemia 12/18/2006  . Osteopenia 10/09/2006    Current Outpatient Medications on File Prior to Visit  Medication Sig Dispense Refill  . aspirin EC 81 MG tablet Take 81 mg by mouth daily.     [name] BIOTIN PO Take 5,000 mcg by mouth daily.     . bisacodyl (DULCOLAX) 5 MG EC tablet Take 5 mg by mouth 3 (three) times a week.     . carvedilol (COREG) 3.125 MG tablet Take 1 tablet (3.125 mg total) by mouth 2 (two) times daily. 180 tablet 0  . cholecalciferol (VITAMIN D) 1000 units tablet Take 1,000 Units by mouth daily.    . dapagliflozin propanediol (FARXIGA) 10 MG TABS tablet Take 10 mg by mouth daily before breakfast. 90 tablet 1  . famotidine (PEPCID) 20 MG tablet Take 1 tablet by mouth twice daily 180 tablet 0  . glucose blood (ONE TOUCH ULTRA TEST) test strip Check blood sugar daily as directed, DX 250.02 100 each 12  . loratadine (CLARITIN) 10 MG tablet Take 1 tablet (10 mg total) by mouth daily. 90 tablet 3  . metFORMIN (GLUCOPHAGE)  500 MG tablet Take 1 tablet (500 mg total) by mouth daily with breakfast. 90 tablet 1  . ONETOUCH DELICA LANCETS FINE MISC 1 each by Does not apply route as directed. Check blood sugar daily as directed, DX 250.02 HOLD UNTIL PATIENT REQUEST 100 each 5  . rosuvastatin (CRESTOR) 40 MG tablet TAKE 1 TABLET BY MOUTH AT BEDTIME 90 tablet 0  . senna-docusate (SENOKOT-S) 8.6-50 MG tablet Take 1 tablet by mouth at bedtime. 30 tablet 0  . sertraline (ZOLOFT) 50 MG tablet Take 1 tablet (50 mg total) by mouth daily. 90 tablet 1  . vitamin B-12 (CYANOCOBALAMIN) 1000 MCG tablet Take 1,000 mcg by mouth daily.     No current facility-administered medications on file prior to visit.    Past Medical History:  Diagnosis Date  . Compression fracture of L1 lumbar vertebra ([place]) 11/21/2017  . Diabetes mellitus without complication ([place])   . Gallbladder sludge   . GERD (gastroesophageal reflux disease)   . Heart disease   . Hyperlipidemia   . NSTEMI (non-ST elevated myocardial infarction) ([place]) 11/2013  . Osteopenia   . Syncope 11/2017    Past Surgical History:  Procedure Laterality Date  . CORONARY ARTERY BYPASS GRAFT N/A 12/01/2013   Procedure: CORONARY ARTERY BYPASS GRAFTING (CABG) x three, using left internal mammary artery, and right leg saphenous vein harvested endoscopically (LIMA to LAD, SVG to OM2 ,  SVG to dRCA);  Surgeon: Grace Isaac, MD;  Location: [facility];  Service: Open Heart Surgery;  Laterality: N/A;  . G 1 P 1    . INTRAOPERATIVE TRANSESOPHAGEAL ECHOCARDIOGRAM N/A 12/01/2013   Procedure: INTRAOPERATIVE TRANSESOPHAGEAL ECHOCARDIOGRAM;  Surgeon: Grace Isaac, MD;  Location: [facility];  Service: Open Heart Surgery;  Laterality: N/A;  . LEFT HEART CATHETERIZATION WITH CORONARY ANGIOGRAM N/A 11/27/2013   Procedure: LEFT HEART CATHETERIZATION WITH CORONARY ANGIOGRAM;  Surgeon: Sinclair Grooms, MD;  Location: [facility] CATH LAB;  Service: Cardiovascular;  Laterality: N/A;  . no colonoscopy     SOC  reviewed  . TONSILLECTOMY AND ADENOIDECTOMY      Social History   Socioeconomic History  . Marital status: Married    Spouse name: Not on file  . Number of children: Not on file  . Years of education: Not on file  . Highest education level: Not on file  Occupational History  . Not on file  Tobacco Use  . Smoking status: Never Smoker  . Smokeless tobacco: Never Used  Substance and Sexual Activity  . Alcohol use: Yes    Comment: Occasionally   . Drug use: No  . Sexual activity: Not on file  Other Topics Concern  . Not on file  Social History Narrative  . Not on file   Social Determinants of Health   Financial Resource Strain:   . Difficulty of Paying Living Expenses: Not on file  Food Insecurity:   . Worried About Charity fundraiser in the Last Year: Not on file  . Ran Out of Food in the Last Year: Not on file  Transportation Needs:   . Lack of Transportation (Medical): Not on file  . Lack of Transportation (Non-Medical): Not on file  Physical Activity:   . Days of Exercise per Week: Not on file  . Minutes of Exercise per Session: Not on file  Stress:   . Feeling of Stress : Not on file  Social Connections:   . Frequency of Communication with Friends and Family: Not on file  . Frequency of Social Gatherings with Friends and Family: Not on file  . Attends Religious Services: Not on file  . Active Member of Clubs or Organizations: Not on file  . Attends Archivist Meetings: Not on file  . Marital Status: Not on file    Family History  Problem Relation Age of Onset  . Heart attack Mother 47  . Diabetes Mother   . Stroke Mother   . Hypertension Mother   . Goiter Mother   . Heart attack Father        >55  . Bladder Cancer Father   . Parkinson's disease Maternal Aunt   . Osteoporosis Sister   . Diabetes Brother     Review of Systems     Objective:  There were no vitals filed for this visit. BP Readings from Last 3 Encounters:  01/02/19 118/72   05/11/18 122/76  04/25/18 132/84   Wt Readings from Last 3 Encounters:  01/02/19 123 lb 12.8 oz (56.2 kg)  05/11/18 129 lb (58.5 kg)  04/25/18 127 lb (57.6 kg)   There is no height or weight on file to calculate BMI.   Physical Exam    Constitutional: Appears well-developed and well-nourished. No distress.  HENT:  Head: Normocephalic and atraumatic.  Neck: Neck supple. No tracheal deviation present. No thyromegaly present.  No cervical lymphadenopathy Cardiovascular: Normal rate, regular rhythm and normal  heart sounds.   No murmur heard. No carotid bruit .  No edema Pulmonary/Chest: Effort normal and breath sounds normal. No respiratory distress. No has no wheezes. No rales.  Skin: Skin is warm and dry. Not diaphoretic.  Psychiatric: Normal mood and affect. Behavior is normal.      Assessment & Plan:    See Problem List for Assessment and Plan of chronic medical problems.    This visit occurred during the SARS-CoV-2 public health emergency.  Safety protocols were in place, including screening questions prior to the visit, additional usage of staff PPE, and extensive cleaning of exam room while observing appropriate contact time as indicated for disinfecting solutions.

## 2019-07-17 ENCOUNTER — Telehealth: Payer: Self-pay

## 2019-07-17 ENCOUNTER — Ambulatory Visit: Payer: Medicare Other | Admitting: Internal Medicine

## 2019-07-17 DIAGNOSIS — M85869 Other specified disorders of bone density and structure, unspecified lower leg: Secondary | ICD-10-CM

## 2019-07-17 DIAGNOSIS — Z1159 Encounter for screening for other viral diseases: Secondary | ICD-10-CM

## 2019-07-17 DIAGNOSIS — D649 Anemia, unspecified: Secondary | ICD-10-CM

## 2019-07-17 DIAGNOSIS — E1159 Type 2 diabetes mellitus with other circulatory complications: Secondary | ICD-10-CM

## 2019-07-17 DIAGNOSIS — I25119 Atherosclerotic heart disease of native coronary artery with unspecified angina pectoris: Secondary | ICD-10-CM

## 2019-07-17 DIAGNOSIS — E559 Vitamin D deficiency, unspecified: Secondary | ICD-10-CM

## 2019-07-17 DIAGNOSIS — E7849 Other hyperlipidemia: Secondary | ICD-10-CM

## 2019-07-17 NOTE — Telephone Encounter (Signed)
Pt aware.

## 2019-07-17 NOTE — Telephone Encounter (Signed)
Patient scheduled for a follow up visit on 3/10 and would like labs done that morning before her appointment. Did you want her to have the appointment first to determine what all is needing to be ordered? Or is it ok for her to come in for labs before?

## 2019-07-17 NOTE — Telephone Encounter (Signed)
New message    The patient was told to call the nurse this morning to see how she's feeling.

## 2019-07-17 NOTE — Telephone Encounter (Signed)
Blood work ordered.

## 2019-07-18 ENCOUNTER — Other Ambulatory Visit: Payer: Self-pay | Admitting: Internal Medicine

## 2019-07-22 DIAGNOSIS — H52203 Unspecified astigmatism, bilateral: Secondary | ICD-10-CM | POA: Diagnosis not present

## 2019-07-22 DIAGNOSIS — E119 Type 2 diabetes mellitus without complications: Secondary | ICD-10-CM | POA: Diagnosis not present

## 2019-07-22 DIAGNOSIS — H5213 Myopia, bilateral: Secondary | ICD-10-CM | POA: Diagnosis not present

## 2019-07-22 LAB — HM DIABETES EYE EXAM

## 2019-07-22 NOTE — Progress Notes (Signed)
Subjective:    Patient ID: Kathryn Logan, female    DOB: 24-Mar-1946, 74 y.o.   MRN: [id number]  HPI The patient is here for follow up of their chronic medical problems, including diabetes, CAD, hyperlipidemia, GERD, Anxiety.  She is taking all of her medications as prescribed.   She is not exercising regularly.      She is drinking a diet soda on occasion.  She is better with watching the sugars.  She has lost weight.    She often has difficulty finishing a task.    Medications and allergies reviewed with patient and updated if appropriate.  Patient Active Problem List   Diagnosis Date Noted   AKI (acute kidney injury) ([place]) 11/26/2017   Anemia 11/26/2017   Syncope 11/20/2017   Compression fracture of L1 lumbar vertebra, closed, initial encounter ([place]) 11/20/2017   Vitamin D deficiency 02/22/2016   Anxiety 02/22/2016   CAD (coronary artery disease) 02/21/2016   GERD (gastroesophageal reflux disease) 04/14/2015   S/P CABG x 3 12/01/2013   NSTEMI (non-ST elevated myocardial infarction) ([place]) 11/26/2013   Type 2 diabetes mellitus with vascular disease ([place]) 08/28/2012   Hyperlipidemia 12/18/2006   Osteopenia 10/09/2006    Current Outpatient Medications on File Prior to Visit  Medication Sig Dispense Refill   aspirin EC 81 MG tablet Take 81 mg by mouth daily.      BIOTIN PO Take 10,000 mcg by mouth daily.      bisacodyl (DULCOLAX) 5 MG EC tablet Take 5 mg by mouth 3 (three) times a week.      Calcium Carb-Cholecalciferol (CALCIUM + VITAMIN D3 PO) Take 600 mg by mouth. 600 mg- 25 mcg twice daily     carvedilol (COREG) 3.125 MG tablet Take 1 tablet (3.125 mg total) by mouth 2 (two) times daily. 180 tablet 0   Cholecalciferol (VITAMIN D3) 50 MCG (2000 UT) TABS Take by mouth.     dapagliflozin propanediol (FARXIGA) 10 MG TABS tablet Take 10 mg by mouth daily before breakfast. 90 tablet 1   famotidine (PEPCID) 20 MG tablet Take 1 tablet by mouth twice  daily 180 tablet 0   glucose blood (ONE TOUCH ULTRA TEST) test strip Check blood sugar daily as directed, DX 250.02 100 each 12   loratadine (CLARITIN) 10 MG tablet Take 1 tablet (10 mg total) by mouth daily. 90 tablet 3   metFORMIN (GLUCOPHAGE) 500 MG tablet Take 1 tablet (500 mg total) by mouth daily with breakfast. 90 tablet 1   ONETOUCH DELICA LANCETS FINE MISC 1 each by Does not apply route as directed. Check blood sugar daily as directed, DX 250.02 HOLD UNTIL PATIENT REQUEST 100 each 5   rosuvastatin (CRESTOR) 40 MG tablet TAKE 1 TABLET BY MOUTH AT BEDTIME 90 tablet 0   senna-docusate (SENOKOT-S) 8.6-50 MG tablet Take 1 tablet by mouth at bedtime. 30 tablet 0   sertraline (ZOLOFT) 50 MG tablet Take 1 tablet (50 mg total) by mouth daily. 90 tablet 1   vitamin B-12 (CYANOCOBALAMIN) 1000 MCG tablet Take 1,000 mcg by mouth daily.     No current facility-administered medications on file prior to visit.    Past Medical History:  Diagnosis Date   Compression fracture of L1 lumbar vertebra ([place]) 11/21/2017   Diabetes mellitus without complication (HCC)    Gallbladder sludge    GERD (gastroesophageal reflux disease)    Heart disease    Hyperlipidemia    NSTEMI (non-ST elevated myocardial infarction) ([place]) 11/2013  Osteopenia    Syncope 11/2017    Past Surgical History:  Procedure Laterality Date   CORONARY ARTERY BYPASS GRAFT N/A 12/01/2013   Procedure: CORONARY ARTERY BYPASS GRAFTING (CABG) x three, using left internal mammary artery, and right leg saphenous vein harvested endoscopically (LIMA to LAD, SVG to OM2 , SVG to dRCA);  Surgeon: Grace Isaac, MD;  Location: [facility];  Service: Open Heart Surgery;  Laterality: N/A;   G 1 P 1     INTRAOPERATIVE TRANSESOPHAGEAL ECHOCARDIOGRAM N/A 12/01/2013   Procedure: INTRAOPERATIVE TRANSESOPHAGEAL ECHOCARDIOGRAM;  Surgeon: Grace Isaac, MD;  Location: [facility];  Service: Open Heart Surgery;  Laterality: N/A;   LEFT  HEART CATHETERIZATION WITH CORONARY ANGIOGRAM N/A 11/27/2013   Procedure: LEFT HEART CATHETERIZATION WITH CORONARY ANGIOGRAM;  Surgeon: Sinclair Grooms, MD;  Location: [facility] CATH [facility];  Service: Cardiovascular;  Laterality: N/A;   no colonoscopy     SOC reviewed   TONSILLECTOMY AND ADENOIDECTOMY      Social History   Socioeconomic History   Marital status: Married    Spouse name: Not on file   Number of children: Not on file   Years of education: Not on file   Highest education level: Not on file  Occupational History   Not on file  Tobacco Use   Smoking status: Never Smoker   Smokeless tobacco: Never Used  Substance and Sexual Activity   Alcohol use: Yes    Comment: Occasionally    Drug use: No   Sexual activity: Not on file  Other Topics Concern   Not on file  Social History Narrative   Not on file   Social Determinants of Health   Financial Resource Strain:    Difficulty of Paying Living Expenses: Not on file  Food Insecurity:    Worried About [place] in the Last Year: Not on file   Ran Out of Food in the Last Year: Not on file  Transportation Needs:    Lack of Transportation (Medical): Not on file   Lack of Transportation (Non-Medical): Not on file  Physical Activity:    Days of Exercise per Week: Not on file   Minutes of Exercise per Session: Not on file  Stress:    Feeling of Stress : Not on file  Social Connections:    Frequency of Communication with Friends and Family: Not on file   Frequency of Social Gatherings with Friends and Family: Not on file   Attends Religious Services: Not on file   Active Member of Clubs or Organizations: Not on file   Attends Archivist Meetings: Not on file   Marital Status: Not on file    Family History  Problem Relation Age of Onset   Heart attack Mother 15   Diabetes Mother    Stroke Mother    Hypertension Mother    Goiter Mother    Heart attack Father        >55    Bladder Cancer Father    Parkinson's disease Maternal Aunt    Osteoporosis Sister    Diabetes Brother     Review of Systems  Constitutional: Negative for chills and fever.  Respiratory: Negative for cough, shortness of breath and wheezing.   Cardiovascular: Negative for chest pain, palpitations and leg swelling.  Neurological: Negative for dizziness, light-headedness and headaches.  Psychiatric/Behavioral: Negative for dysphoric mood. The patient is nervous/anxious (controlled).        Objective:   Vitals:  07/23/19 0842  BP: 118/78  Pulse: 91  Resp: 16  Temp: 98.3 F (36.8 C)  SpO2: 98%   BP Readings from Last 3 Encounters:  07/23/19 118/78  01/02/19 118/72  05/11/18 122/76   Wt Readings from Last 3 Encounters:  07/23/19 119 lb (54 kg)  01/02/19 123 lb 12.8 oz (56.2 kg)  05/11/18 129 lb (58.5 kg)   Body mass index is 21.77 kg/m.   Physical Exam    Constitutional: Appears well-developed and well-nourished. No distress.  HENT:  Head: Normocephalic and atraumatic.  Neck: Neck supple. No tracheal deviation present. No thyromegaly present.  No cervical lymphadenopathy Cardiovascular: Normal rate, regular rhythm and normal heart sounds.   No murmur heard. No carotid bruit .  No edema Pulmonary/Chest: Effort normal and breath sounds normal. No respiratory distress. No has no wheezes. No rales.  Skin: Skin is warm and dry. Not diaphoretic.  Psychiatric: Normal mood and affect. Behavior is normal.      Assessment & Plan:    See Problem List for Assessment and Plan of chronic medical problems.    This visit occurred during the SARS-CoV-2 public health emergency.  Safety protocols were in place, including screening questions prior to the visit, additional usage of staff PPE, and extensive cleaning of exam room while observing appropriate contact time as indicated for disinfecting solutions.

## 2019-07-22 NOTE — Patient Instructions (Addendum)
  Blood work was ordered.    All other Health Maintenance issues reviewed.   All recommended immunizations and age-appropriate screenings are up-to-date or discussed.  No immunization administered today.   Medications reviewed and updated.  Changes include :   none  Your prescription(s) have been submitted to your pharmacy. Please take as directed and contact our office if you believe you are having problem(s) with the medication(s).    Please followup in 6 months

## 2019-07-23 ENCOUNTER — Other Ambulatory Visit (INDEPENDENT_AMBULATORY_CARE_PROVIDER_SITE_OTHER): Payer: Medicare Other

## 2019-07-23 ENCOUNTER — Other Ambulatory Visit: Payer: Self-pay

## 2019-07-23 ENCOUNTER — Ambulatory Visit (INDEPENDENT_AMBULATORY_CARE_PROVIDER_SITE_OTHER): Payer: Medicare Other | Admitting: Internal Medicine

## 2019-07-23 ENCOUNTER — Encounter: Payer: Self-pay | Admitting: Internal Medicine

## 2019-07-23 VITALS — BP 118/78 | HR 91 | Temp 98.3°F | Resp 16 | Ht 62.0 in | Wt 119.0 lb

## 2019-07-23 DIAGNOSIS — Z1159 Encounter for screening for other viral diseases: Secondary | ICD-10-CM | POA: Diagnosis not present

## 2019-07-23 DIAGNOSIS — D649 Anemia, unspecified: Secondary | ICD-10-CM | POA: Diagnosis not present

## 2019-07-23 DIAGNOSIS — F419 Anxiety disorder, unspecified: Secondary | ICD-10-CM | POA: Diagnosis not present

## 2019-07-23 DIAGNOSIS — I25119 Atherosclerotic heart disease of native coronary artery with unspecified angina pectoris: Secondary | ICD-10-CM | POA: Diagnosis not present

## 2019-07-23 DIAGNOSIS — E7849 Other hyperlipidemia: Secondary | ICD-10-CM | POA: Diagnosis not present

## 2019-07-23 DIAGNOSIS — E1159 Type 2 diabetes mellitus with other circulatory complications: Secondary | ICD-10-CM

## 2019-07-23 DIAGNOSIS — K219 Gastro-esophageal reflux disease without esophagitis: Secondary | ICD-10-CM | POA: Diagnosis not present

## 2019-07-23 LAB — CBC WITH DIFFERENTIAL/PLATELET
Basophils Absolute: 0.1 10*3/uL (ref 0.0–0.1)
Basophils Relative: 0.6 % (ref 0.0–3.0)
Eosinophils Absolute: 0.5 10*3/uL (ref 0.0–0.7)
Eosinophils Relative: 5.3 % — ABNORMAL HIGH (ref 0.0–5.0)
HCT: 34.5 % — ABNORMAL LOW (ref 36.0–46.0)
Hemoglobin: 11.6 g/dL — ABNORMAL LOW (ref 12.0–15.0)
Lymphocytes Relative: 26.4 % (ref 12.0–46.0)
Lymphs Abs: 2.5 10*3/uL (ref 0.7–4.0)
MCHC: 33.7 g/dL (ref 30.0–36.0)
MCV: 88.1 fl (ref 78.0–100.0)
Monocytes Absolute: 0.5 10*3/uL (ref 0.1–1.0)
Monocytes Relative: 4.9 % (ref 3.0–12.0)
Neutro Abs: 5.9 10*3/uL (ref 1.4–7.7)
Neutrophils Relative %: 62.8 % (ref 43.0–77.0)
Platelets: 267 10*3/uL (ref 150.0–400.0)
RBC: 3.91 Mil/uL (ref 3.87–5.11)
RDW: 16 % — ABNORMAL HIGH (ref 11.5–15.5)
WBC: 9.4 10*3/uL (ref 4.0–10.5)

## 2019-07-23 LAB — COMPREHENSIVE METABOLIC PANEL
ALT: 8 U/L (ref 0–35)
AST: 13 U/L (ref 0–37)
Albumin: 4 g/dL (ref 3.5–5.2)
Alkaline Phosphatase: 79 U/L (ref 39–117)
BUN: 23 mg/dL (ref 6–23)
CO2: 21 mEq/L (ref 19–32)
Calcium: 9.5 mg/dL (ref 8.4–10.5)
Chloride: 107 mEq/L (ref 96–112)
Creatinine, Ser: 0.99 mg/dL (ref 0.40–1.20)
GFR: 55.1 mL/min — ABNORMAL LOW (ref >60.00)
Glucose, Bld: 223 mg/dL — ABNORMAL HIGH (ref 70–99)
Potassium: 4.5 mEq/L (ref 3.5–5.1)
Sodium: 137 mEq/L (ref 135–145)
Total Bilirubin: 0.6 mg/dL (ref 0.2–1.2)
Total Protein: 7.2 g/dL (ref 6.0–8.3)

## 2019-07-23 LAB — MICROALBUMIN / CREATININE URINE RATIO
Creatinine,U: 52.5 mg/dL
Microalb Creat Ratio: 6.7 mg/g (ref 0.0–30.0)
Microalb, Ur: 3.5 mg/dL — ABNORMAL HIGH (ref 0.0–1.9)

## 2019-07-23 LAB — LIPID PANEL
Cholesterol: 100 mg/dL (ref 0–200)
HDL: 26.3 mg/dL — ABNORMAL LOW (ref >39.00)
NonHDL: 73.58
Total CHOL/HDL Ratio: 4
Triglycerides: 204 mg/dL — ABNORMAL HIGH (ref 0.0–149.0)
VLDL: 40.8 mg/dL — ABNORMAL HIGH (ref 0.0–40.0)

## 2019-07-23 LAB — HEMOGLOBIN A1C: Hgb A1c MFr Bld: 11.5 % — ABNORMAL HIGH (ref 4.6–6.5)

## 2019-07-23 LAB — LDL CHOLESTEROL, DIRECT: Direct LDL: 49 mg/dL

## 2019-07-23 NOTE — Assessment & Plan Note (Signed)
Check CBC 

## 2019-07-23 NOTE — Assessment & Plan Note (Signed)
Chronic Controlled, stable Continue current dose of medication-sertraline 50 mg daily  

## 2019-07-23 NOTE — Assessment & Plan Note (Signed)
Chronic Sugars have not been controlled, but she has revised her diet, lost weight and is taking her medication daily-Farxiga is new since her last blood work Check A1c We will adjust medications if needed Stressed regular exercise Continue low sugar/carbohydrate diet Follow-up in 6 months

## 2019-07-23 NOTE — Assessment & Plan Note (Signed)
Chronic GERD controlled Continue daily medication - pepcid 20 mg daily

## 2019-07-23 NOTE — Assessment & Plan Note (Signed)
Chronic No concerning symptoms of angina Continue statin, Coreg and aspirin 81 mg daily CBC, CMP, lipids

## 2019-07-23 NOTE — Assessment & Plan Note (Signed)
Chronic Check lipid panel  Continue daily statin Regular exercise and healthy diet encouraged  

## 2019-07-24 ENCOUNTER — Encounter: Payer: Self-pay | Admitting: Internal Medicine

## 2019-07-24 ENCOUNTER — Telehealth: Payer: Self-pay | Admitting: Internal Medicine

## 2019-07-24 DIAGNOSIS — I25119 Atherosclerotic heart disease of native coronary artery with unspecified angina pectoris: Secondary | ICD-10-CM

## 2019-07-24 LAB — HEPATITIS C ANTIBODY
Hepatitis C Ab: NONREACTIVE
SIGNAL TO CUT-OFF: 0.01 (ref <1.00)

## 2019-07-24 NOTE — Chronic Care Management (AMB) (Signed)
  Chronic Care Management   Note  07/24/2019 Name: Kathryn Logan MRN: [id number] DOB: May 25, 1945  Kathryn Logan is a 74 y.o. year old female who is a primary care patient of Burns, Claudina Lick, MD. I reached out to [name] by phone today in response to a referral sent by [name]'s PCP, Binnie Rail, MD.   [name] was given information about Chronic Care Management services today including:  1. CCM service includes personalized support from designated clinical staff supervised by her physician, including individualized plan of care and coordination with other care providers 2. 24/7 contact phone numbers for assistance for urgent and routine care needs. 3. Service will only be billed when office clinical staff spend 20 minutes or more in a month to coordinate care. 4. Only one practitioner may furnish and bill the service in a calendar month. 5. The patient may stop CCM services at any time (effective at the end of the month) by phone call to the office staff.   Patient agreed to services and verbal consent obtained.   Follow up plan:   [name] [employer] Scheduler

## 2019-07-28 ENCOUNTER — Other Ambulatory Visit: Payer: Self-pay | Admitting: Internal Medicine

## 2019-07-30 ENCOUNTER — Telehealth: Payer: Self-pay | Admitting: Internal Medicine

## 2019-07-30 MED ORDER — GLIMEPIRIDE 2 MG PO TABS: 2.0000 mg | ORAL_TABLET | Freq: Every day | ORAL | 1 refills | Status: DC

## 2019-07-30 MED ORDER — METFORMIN HCL 500 MG PO TABS: 500.0000 mg | ORAL_TABLET | Freq: Two times a day (BID) | ORAL | 1 refills | Status: DC

## 2019-07-30 NOTE — Telephone Encounter (Signed)
Spoke with pt in regards. New rx for glimepiride sent to pharmacy per recent lab results.

## 2019-07-30 NOTE — Telephone Encounter (Signed)
   Patient calling, states she was under the impression her diabetes medication was going to be changed during last office visit. "Nothing calling into the pharmacy"   Requesting call from [place]

## 2019-08-07 ENCOUNTER — Ambulatory Visit: Payer: Medicare Other | Admitting: Internal Medicine

## 2019-08-13 ENCOUNTER — Ambulatory Visit (INDEPENDENT_AMBULATORY_CARE_PROVIDER_SITE_OTHER): Payer: Medicare Other | Admitting: Internal Medicine

## 2019-08-13 ENCOUNTER — Encounter: Payer: Self-pay | Admitting: Internal Medicine

## 2019-08-13 ENCOUNTER — Telehealth: Payer: Medicare Other

## 2019-08-13 ENCOUNTER — Other Ambulatory Visit: Payer: Self-pay

## 2019-08-13 VITALS — BP 118/80 | HR 95 | Ht 62.0 in | Wt 121.8 lb

## 2019-08-13 DIAGNOSIS — E782 Mixed hyperlipidemia: Secondary | ICD-10-CM

## 2019-08-13 DIAGNOSIS — R000 Tachycardia, unspecified: Secondary | ICD-10-CM

## 2019-08-13 DIAGNOSIS — Z951 Presence of aortocoronary bypass graft: Secondary | ICD-10-CM

## 2019-08-13 DIAGNOSIS — I25119 Atherosclerotic heart disease of native coronary artery with unspecified angina pectoris: Secondary | ICD-10-CM | POA: Diagnosis not present

## 2019-08-13 DIAGNOSIS — E1165 Type 2 diabetes mellitus with hyperglycemia: Secondary | ICD-10-CM

## 2019-08-13 MED ORDER — CARVEDILOL 3.125 MG PO TABS: 3.1250 mg | ORAL_TABLET | Freq: Two times a day (BID) | ORAL | 3 refills | Status: DC

## 2019-08-13 NOTE — Patient Instructions (Signed)

## 2019-08-13 NOTE — Progress Notes (Signed)
OFFICE NOTE  Chief Complaint:  No complaints  Primary Care Physician: Binnie Rail, MD  HPI:  Kathryn Logan is a 74 year old female with a h/o diet controlled DM but no prior cardiac history who presented to [facility] on 11/26/2013 with chest discomfort with associated nausea and diaphoresis. EKG demonstrated nonspecific T-wave abnormalities. Initial cardiac enzymes were elevated and she ruled in for non-ST elevation myocardial infarction. Subsequently, she underwent a left heart catheterization, perform by Dr. Tamala Julian, which revealed severe coronary artery disease involving the proximal LAD, the first and second marginal branches and a nondominant right coronary artery. She was found to have moderate to severe left ventricular systolic dysfunction with an estimated ejection fraction of 30-35%. However, post-cath 2-D echocardiogram revealed an EF of 40-45%. Surgical revascularization was recommended for her CAD. She ultimately underwent CABG x3 by Dr. Servando Snare on 12/01/2013. She received a LIMA-LAD, SVG-OM2, SVG-dRCA. She had an uncomplicated postoperative course. She was discharged home on 12/05/2013. She was discharged home on aspirin, metoprolol, lisinopril and atorvastatin.  Kathryn Logan was recently seen by Ellen Henri, PA-C., who recommended increasing her beta blocker due to tachycardia. Recently she's been having problems with hypotension, mostly in the morning at cardiac rehabilitation. She is generally asymptomatic with this.  I saw Ms. Logan back in the office today. Overall she is doing really well. She seems to be maintaining a high normal heart rate in the 80s and 90s. Think a lot of this may be due to anxiety. She's had low blood pressure in the past however I moved her lisinopril to take at night and her blood pressures been more stable. She denies any chest pain or worsening shortness of breath. She's not on any exercise over the past month and has a small  amount of weight gain. She signed up for maintenance cardiac redilatation starting in a couple of days. Hopefully this will help her with weight loss and better cardiac conditioning.  Kathryn Logan returns today for follow-up. Overall she seems to be doing well. She denies any chest pain and only had some mild shortness of breath when walking [place]. She says she can do most activities here without any problems. She continues to have a persistent tachycardia. Heart rate today was close to 120 at rest. She did take her beta blocker and recently that dose had been increased up to 50 mg twice daily of Lopressor. She does not feel fatigued or any different on a higher dose of the medicine and does not perceive any significant change in her heart rate.  06/09/2016  Kathryn Logan was seen today in follow-up. Over the past year she's done very well. She denies any chest pain or worsening shortness of breath. She notes that the change to carvedilol has helped her tachycardia. She is due for some repeat lab work with his which was ordered by Dr. Quay Burow but she has not yet had that drawn.  02/10/2018  Kathryn Logan returns today for follow-up.  In July she saw Rosaria Ferries, PA-C, after a syncopal episode.  Was felt that she may have been dehydrated in combination with a higher dose blood pressure medicine and recent weight loss.  Her medications were stopped and she was rehydrated.  Unfortunately she had a lumbar fracture for which she continues to wear back brace.  Hopefully she will be out of that in a few weeks.  She denies any chest pain or worsening shortness of breath and has had  no further syncopal episodes.  She is now on low-dose carvedilol.  He did wear a monitor which showed no evidence of A. fib, ventricular arrhythmias, pauses or any explanation for syncope rather just very infrequent PACs and PVCs less than 1%.  08/13/2019  Kathryn Logan returns today for follow-up.  More recently her blood  sugars have been poorly controlled.  Hemoglobin A1c was 11.5 and lipids showed total cholesterol of 100, direct LDL 49 with triglycerides of 204.  This is likely related to her uncontrolled blood sugar.  Blood pressure is well controlled at 118/80.  She is on rosuvastatin 40 mg.  EKG shows sinus rhythm.  She denies any chest pain.  She has had no further syncopal episodes.  She notes very infrequent palpitations.  PMHx:  Past Medical History:  Diagnosis Date  . Compression fracture of L1 lumbar vertebra ([place]) 11/21/2017  . Diabetes mellitus without complication ([place])   . Gallbladder sludge   . GERD (gastroesophageal reflux disease)   . Heart disease   . Hyperlipidemia   . NSTEMI (non-ST elevated myocardial infarction) ([place]) 11/2013  . Osteopenia   . Syncope 11/2017    Past Surgical History:  Procedure Laterality Date  . CORONARY ARTERY BYPASS GRAFT N/A 12/01/2013   Procedure: CORONARY ARTERY BYPASS GRAFTING (CABG) x three, using left internal mammary artery, and right leg saphenous vein harvested endoscopically (LIMA to LAD, SVG to OM2 , SVG to dRCA);  Surgeon: Grace Isaac, MD;  Location: [facility];  Service: Open Heart Surgery;  Laterality: N/A;  . G 1 P 1    . INTRAOPERATIVE TRANSESOPHAGEAL ECHOCARDIOGRAM N/A 12/01/2013   Procedure: INTRAOPERATIVE TRANSESOPHAGEAL ECHOCARDIOGRAM;  Surgeon: Grace Isaac, MD;  Location: [facility];  Service: Open Heart Surgery;  Laterality: N/A;  . LEFT HEART CATHETERIZATION WITH CORONARY ANGIOGRAM N/A 11/27/2013   Procedure: LEFT HEART CATHETERIZATION WITH CORONARY ANGIOGRAM;  Surgeon: Sinclair Grooms, MD;  Location: [facility] CATH LAB;  Service: Cardiovascular;  Laterality: N/A;  . no colonoscopy     SOC reviewed  . TONSILLECTOMY AND ADENOIDECTOMY      FAMHx:  Family History  Problem Relation Age of Onset  . Heart attack Mother 21  . Diabetes Mother   . Stroke Mother   . Hypertension Mother   . Goiter Mother   . Heart attack Father        >55  .  Bladder Cancer Father   . Parkinson's disease Maternal Aunt   . Osteoporosis Sister   . Diabetes Brother     SOCHx:   reports that she has never smoked. She has never used smokeless tobacco. She reports current alcohol use. She reports that she does not use drugs.  ALLERGIES:  No Known Allergies  ROS: A comprehensive review of systems was negative.  HOME MEDS: Current Outpatient Medications  Medication Sig Dispense Refill  . aspirin EC 81 MG tablet Take 81 mg by mouth daily.     [name] BIOTIN PO Take 10,000 mcg by mouth daily.     . bisacodyl (DULCOLAX) 5 MG EC tablet Take 5 mg by mouth 3 (three) times a week.     . Calcium Carb-Cholecalciferol (CALCIUM + VITAMIN D3 PO) Take 600 mg by mouth. 600 mg- 25 mcg twice daily    . carvedilol (COREG) 3.125 MG tablet Take 1 tablet (3.125 mg total) by mouth 2 (two) times daily. 180 tablet 0  . Cholecalciferol (VITAMIN D3) 50 MCG (2000 UT) TABS Take by mouth.    [name]  dapagliflozin propanediol (FARXIGA) 10 MG TABS tablet Take 10 mg by mouth daily before breakfast. 90 tablet 1  . famotidine (PEPCID) 20 MG tablet Take 1 tablet by mouth twice daily 180 tablet 0  . glimepiride (AMARYL) 2 MG tablet Take 1 tablet (2 mg total) by mouth daily before breakfast. 30 tablet 1  . glucose blood (ONE TOUCH ULTRA TEST) test strip Check blood sugar daily as directed, DX 250.02 100 each 12  . loratadine (CLARITIN) 10 MG tablet Take 1 tablet (10 mg total) by mouth daily. 90 tablet 3  . metFORMIN (GLUCOPHAGE) 500 MG tablet Take 1 tablet (500 mg total) by mouth 2 (two) times daily with a meal. 180 tablet 1  . ONETOUCH DELICA LANCETS FINE MISC 1 each by Does not apply route as directed. Check blood sugar daily as directed, DX 250.02 HOLD UNTIL PATIENT REQUEST 100 each 5  . rosuvastatin (CRESTOR) 40 MG tablet TAKE 1 TABLET BY MOUTH AT BEDTIME 90 tablet 0  . senna-docusate (SENOKOT-S) 8.6-50 MG tablet Take 1 tablet by mouth at bedtime. 30 tablet 0  . sertraline (ZOLOFT) 50 MG  tablet Take 1 tablet (50 mg total) by mouth daily. 90 tablet 1  . vitamin B-12 (CYANOCOBALAMIN) 1000 MCG tablet Take 1,000 mcg by mouth daily.     No current facility-administered medications for this visit.    LABS/IMAGING: No results found for this or any previous visit (from the past 48 hour(s)). No results found.  VITALS: BP 118/80   Pulse 95   Ht 5\' 2"  (1.575 m)   Wt 121 lb 12.8 oz (55.2 kg)   SpO2 98%   BMI 22.28 kg/m   EXAM: General appearance: alert and no distress Neck: no carotid bruit and no JVD Lungs: clear to auscultation bilaterally Heart: regular rate and rhythm, S1, S2 normal, no murmur, click, rub or gallop Abdomen: soft, non-tender; bowel sounds normal; no masses,  no organomegaly Extremities: extremities normal, atraumatic, no cyanosis or edema Pulses: 2+ and symmetric Skin: Skin color, texture, turgor normal. No rashes or lesions Neurologic: Grossly normal Psych: Anxious  EKG: Normal sinus rhythm at 95 and personally reviewed   ASSESSMENT: 1. Syncope due to orthostatic hypotension 2. Coronary artery disease status post three-vessel CABG in 2015 (LIMA-LAD, SVG-OM2, SVG-dRCA) 3. DM2-A1c 11.5 4. Dyslipidemia 5. Inappropriate sinus tachycardia   PLAN: 1.   Kathryn Logan has uncontrolled diabetes and is working with her PCP on this.  It does not sound like she eats a significantly high sugar/carbohydrate diet and her weight is well within the normal range.  I wonder at this point if she is stop making endogenous insulin and may be more like a type 1.5 diabetic.  I will defer to her PCP on management of this however it is likely also driving her triglycerides to be elevated.  No medication changes today.  Follow-up annually or sooner as necessary.  Pixie Casino, MD, [facility], [place] Director of the [facility]  Card[facility] Diplomate of the American Board of Clinical Lipidology  Attending Cardiologist  Direct Dial: [phone]  Fax: [phone]  Website:  [email or web address] 08/13/2019, 10:49 AM

## 2019-08-14 ENCOUNTER — Encounter: Payer: Self-pay | Admitting: Internal Medicine

## 2019-08-24 NOTE — Chronic Care Management (AMB) (Signed)
Chronic Care Management Pharmacy  Name: Kathryn Logan  MRN: [id number] DOB: 09-24-1945  Chief Complaint/ HPI  Kathryn Logan,  74 y.o. , female presents for their Initial CCM visit with the clinical pharmacist via telephone due to COVID-19 Pandemic.  PCP : Binnie Rail, MD  Their chronic conditions include: T2DM, CAD (CABG X 3), HLD, GERD   Office Visits: 07/23/19 Dr Quay Burow OV: A1c remains elevated  Consult Visit: 08/13/19 Dr Debara Pickett (cardiology): cardiology dx stable. Concern about DM, persistently elevated A1c with poor diet, may be Type 1.5 at this point.  Medications: Outpatient Encounter Medications as of 08/25/2019  Medication Sig  . aspirin EC 81 MG tablet Take 81 mg by mouth daily.   [name] BIOTIN PO Take 10,000 mcg by mouth daily.   . bisacodyl (DULCOLAX) 5 MG EC tablet Take 5 mg by mouth 3 (three) times a week.   . Calcium Carb-Cholecalciferol (CALCIUM + VITAMIN D3 PO) Take 600 mg by mouth. 600 mg- 25 mcg twice daily  . carvedilol (COREG) 3.125 MG tablet Take 1 tablet (3.125 mg total) by mouth 2 (two) times daily.  . Cholecalciferol (VITAMIN D3) 50 MCG (2000 UT) TABS Take by mouth.  . dapagliflozin propanediol (FARXIGA) 10 MG TABS tablet Take 10 mg by mouth daily before breakfast.  . famotidine (PEPCID) 20 MG tablet Take 1 tablet by mouth twice daily  . glimepiride (AMARYL) 2 MG tablet Take 1 tablet (2 mg total) by mouth daily before breakfast.  . glucose blood (ONE TOUCH ULTRA TEST) test strip Check blood sugar daily as directed, DX 250.02  . loratadine (CLARITIN) 10 MG tablet Take 1 tablet (10 mg total) by mouth daily.  . metFORMIN (GLUCOPHAGE) 500 MG tablet Take 1 tablet (500 mg total) by mouth 2 (two) times daily with a meal.  . ONETOUCH DELICA LANCETS FINE MISC 1 each by Does not apply route as directed. Check blood sugar daily as directed, DX 250.02 HOLD UNTIL PATIENT REQUEST  . rosuvastatin (CRESTOR) 40 MG tablet TAKE 1 TABLET BY MOUTH AT BEDTIME  . senna-docusate  (SENOKOT-S) 8.6-50 MG tablet Take 1 tablet by mouth at bedtime.  . sertraline (ZOLOFT) 50 MG tablet Take 1 tablet (50 mg total) by mouth daily.  . vitamin B-12 (CYANOCOBALAMIN) 1000 MCG tablet Take 1,000 mcg by mouth daily.   No facility-administered encounter medications on file as of 08/25/2019.     Current Diagnosis/Assessment:  Goals Addressed            This Visit's Progress   . Diabetes: Goal A1c < 8%       CARE PLAN ENTRY (see longitudinal plan of care for additional care plan information)  Current Barriers:  . Diabetes: uncontrolled; complicated by chronic medical conditions including CAD, HLD, Lab Results  Component Value Date   HGBA1C 11.5 (H) 07/23/2019 .   Lab Results  Component Value Date   CREATININE 0.99 07/23/2019   CREATININE 0.95 01/01/2019   CREATININE 1.24 (H) 11/26/2017 .   [name] No results found for: EGFR . Current antihyperglycemic regimen: Metformin 500 mg twice a day, Farxiga 10 mg daily, glimepiride 2 mg daily . Denies hypoycemic symptoms, including dizziness, lightheadedness, shaking, sweating . denies hyperglycemic symptoms, including polyuria, polydipsia, polyphagia, nocturia, blurred vision, neuropathy . Current exercise: works out every morning . Current blood glucose readings: not checking . Cardiovascular risk reduction: o Current hypertensive regimen: carvedilol 3.125 mg BID o Current hyperlipidemia regimen: rosuvastatin 40 mg o Current antiplatelet regimen: aspirin 81 mg  Pharmacist Clinical Goal(s):  [name] Over the next 30 days, patient will work with PharmD and primary care provider to address elevated A1c and lack of self monitoring  Interventions: . Comprehensive medication review performed, medication list updated in electronic medical record . Discussed importance of taking glimepiride with a meal . Discussed importance of checking blood sugar daily, varying time of day . Discussed consequences of consistently elevated blood  sugars  Patient Self Care Activities:  . Patient will check blood glucose daily , document, and provide at future appointments . Patient will take medications as prescribed . Patient will contact provider with any episodes of hypoglycemia . Patient will report any questions or concerns to provider   Initial goal documentation     . Medication assistance       CARE PLAN ENTRY (see longitudinal plan of care for additional care plan information)  Current Barriers:  . Financial Barriers: patient has [employer] insurance and reports copay for Wilder Glade is cost prohibitive at this time  Pharmacist Clinical Goal(s):  [name] Over the next 30 days, patient will work with PharmD and providers to relieve medication access concerns  Interventions: . Comprehensive medication review completed; medication list updated in electronic medical record.  Bertram Savin care team collaboration (see longitudinal plan of care) . Wilder Glade by [employer]: Patient meets income/out of pocket spend criteria for this medication's patient assistance program. Reviewed application process. Patient will provide proof of income, out of pocket spend report, and will sign application. Will collaborate with primary care provider  for their portion of application. Once completed, will submit to [place] patient [place] program.  Patient Self Care Activities:  . Patient will provide necessary portions of application   Initial goal documentation        Hypertension    Office blood pressures are  BP Readings from Last 3 Encounters:  08/13/19 118/80  07/23/19 118/78  01/02/19 118/72   Patient has failed these meds in the past: lisinopril, metoprolol Patient is currently controlled on the following medications: carvedilol 3.125 mg BID  Patient checks BP at home never  Patient home BP readings are ranging: n/a  We discussed diet and exercise extensively  Plan  Continue current medications and control with  diet and exercise     Hyperlipidemia   Lipid Panel     Component Value Date/Time   CHOL 100 07/23/2019 0747   TRIG 204.0 (H) 07/23/2019 0747   HDL 26.30 (L) 07/23/2019 0747   CHOLHDL 4 07/23/2019 0747   VLDL 40.8 (H) 07/23/2019 0747   LDLCALC 55 01/01/2019 0848   LDLDIRECT 49.0 07/23/2019 0747    The ASCVD Risk score (Goff DC Jr., et al., 2013) failed to calculate for the following reasons:   The patient has a prior MI or stroke diagnosis   Patient has failed these meds in past: atorvastatin Patient is currently controlled on the following medications: rosuvastatin 40 mg HS, aspirin 81 mg  We discussed:  Cholesterol goals, benefits of statin. Also discussed benefits of fish oil for triglycerides, however pt states she does not want to add any more pills.  Plan  Continue current medications and control with diet and exercise   Diabetes   Recent Relevant Labs: Lab Results  Component Value Date/Time   HGBA1C 11.5 (H) 07/23/2019 07:47 AM   HGBA1C 12.9 (H) 01/01/2019 08:48 AM   MICROALBUR 3.5 (H) 07/23/2019 07:47 AM   MICROALBUR 2.9 (H) 04/13/2015 11:46 AM    Lab Results  Component Value Date  CREATININE 0.99 07/23/2019   CREATININE 0.95 01/01/2019   CREATININE 1.24 (H) 11/26/2017   Checking BG: Daily  Patient has failed these meds in past: pioglitazone, Januvia Patient is currently uncontrolled on the following medications: metformin 500 mg BID, glimepiride 2 mg daily, Farxiga 10 mg daily   Last diabetic Eye exam:  Lab Results  Component Value Date/Time   HMDIABEYEEXA No Retinopathy 07/22/2019 09:09 AM    Last diabetic Foot exam: No results found for: HMDIABFOOTEX   We discussed: diet and exercise extensively; pt reports she knows she has to "cut sugar out completely". Discussed plate method. Discussed complications of long term uncontrolled blood sugar. Pt only recently started taking glimepiride after talking to cardiologist, she was worried about side effects  effecting the heart. She hasn't checked her BG in a while, discussed BG and A1c targets. Pt though 120 was too high, reiterated goal fasting and 2-hr post prandial. Wilder Glade copay is also high and pt wants to pursue patient assistance.  Pt reports she's making significant changes to diet and compliant with meds. It is likely that she will need insulin since A1c has been consistently elevated (>10%) since 2018. Pt is currently resistant to starting to insulin but may be amenable if A1c is still not improved at next check.  Plan  Continue current medications and control with diet and exercise  Consider insulin initiation if A1c does not improve at next check Pursue PAP for Farxiga  Anxiety   Patient has failed these meds in past: n/a Patient is currently controlled on the following medications: sertraline 50 mg daily  We discussed:  Pt is very happy with mood medication  Plan  Continue current medications    GERD/GI   Patient has failed these meds in past: n/a Patient is currently controlled on the following medications: famotidine 20 mg BID, bisacodyl 5 mg TIW, senna-docusate HS  We discussed:  Discussed optimal timing of famotidine 30 min before a meal.  Plan  Continue current medications   Health Maintenance   Patient is currently controlled on the following medications: Vitamin B12, Biotin, Vitamin D 2000 IU, calcium-Vitamin D BID, loratadine 10 mg daily  We discussed:  Patient is satisfied with current OTC regimen and denies issues. Has no issue with sinus infection since taking Vitamin D. She does report trouble sleeping, recommended melatonin 5-10 mg for help with sleep.  Plan  Continue current medications    Medication Management   Pt uses [facility] for all medications Use pill box Pt endorses 100% compliance  We discussed: Pt is satisfied with current med management, she reports compliance with meds as prescribed. She is hesitant to start new meds in  general and wary of side effects.   Plan  Continue current medication management strategy      Follow up: 1 month phone visit   Charlene Brooke, PharmD Clinical Pharmacist [facility] [phone]

## 2019-08-25 ENCOUNTER — Other Ambulatory Visit: Payer: Self-pay

## 2019-08-25 ENCOUNTER — Ambulatory Visit: Payer: Medicare Other | Admitting: Pharmacist

## 2019-08-25 DIAGNOSIS — I25119 Atherosclerotic heart disease of native coronary artery with unspecified angina pectoris: Secondary | ICD-10-CM

## 2019-08-25 DIAGNOSIS — E1159 Type 2 diabetes mellitus with other circulatory complications: Secondary | ICD-10-CM

## 2019-08-25 DIAGNOSIS — F419 Anxiety disorder, unspecified: Secondary | ICD-10-CM

## 2019-08-25 DIAGNOSIS — K219 Gastro-esophageal reflux disease without esophagitis: Secondary | ICD-10-CM

## 2019-08-25 DIAGNOSIS — E7849 Other hyperlipidemia: Secondary | ICD-10-CM

## 2019-08-25 NOTE — Addendum Note (Signed)
Addended by: Aviva Signs M on: 08/25/2019 10:20 AM   Modules accepted: Orders

## 2019-08-25 NOTE — Patient Instructions (Signed)
Visit Information  Thank you for meeting with me to discuss your medications! I look forward to working with you to achieve your health care goals. Below is a summary of what we talked about during the visit:  Goals Addressed            This Visit's Progress   . Diabetes: Goal A1c < 8%       CARE PLAN ENTRY (see longitudinal plan of care for additional care plan information)  Current Barriers:  . Diabetes: uncontrolled; complicated by chronic medical conditions including CAD, HLD, Lab Results  Component Value Date   HGBA1C 11.5 (H) 07/23/2019 .   Lab Results  Component Value Date   CREATININE 0.99 07/23/2019   CREATININE 0.95 01/01/2019   CREATININE 1.24 (H) 11/26/2017 .   [name] No results found for: EGFR . Current antihyperglycemic regimen: Metformin 500 mg twice a day, Farxiga 10 mg daily, glimepiride 2 mg daily . Denies hypoycemic symptoms, including dizziness, lightheadedness, shaking, sweating . denies hyperglycemic symptoms, including polyuria, polydipsia, polyphagia, nocturia, blurred vision, neuropathy . Current exercise: works out every morning . Current blood glucose readings: not checking . Cardiovascular risk reduction: o Current hypertensive regimen: carvedilol 3.125 mg BID o Current hyperlipidemia regimen: rosuvastatin 40 mg o Current antiplatelet regimen: aspirin 81 mg   Pharmacist Clinical Goal(s):  [name] Over the next 30 days, patient will work with PharmD and primary care provider to address elevated A1c and lack of self monitoring  Interventions: . Comprehensive medication review performed, medication list updated in electronic medical record . Discussed importance of taking glimepiride with a meal . Discussed importance of checking blood sugar daily, varying time of day . Discussed consequences of consistently elevated blood sugars  Patient Self Care Activities:  . Patient will check blood glucose daily , document, and provide at future  appointments . Patient will take medications as prescribed . Patient will contact provider with any episodes of hypoglycemia . Patient will report any questions or concerns to provider   Initial goal documentation     . Medication assistance       CARE PLAN ENTRY (see longitudinal plan of care for additional care plan information)  Current Barriers:  . Financial Barriers: patient has [employer] insurance and reports copay for Wilder Glade is cost prohibitive at this time  Pharmacist Clinical Goal(s):  [name] Over the next 30 days, patient will work with PharmD and providers to relieve medication access concerns  Interventions: . Comprehensive medication review completed; medication list updated in electronic medical record.  Bertram Savin care team collaboration (see longitudinal plan of care) . Wilder Glade by [employer]: Patient meets income/out of pocket spend criteria for this medication's patient assistance program. Reviewed application process. Patient will provide proof of income, out of pocket spend report, and will sign application. Will collaborate with primary care provider  for their portion of application. Once completed, will submit to [place] patient assistance program.  Patient Self Care Activities:  . Patient will provide necessary portions of application   Initial goal documentation        Ms. Lanius was given information about Chronic Care Management services today including:  1. CCM service includes personalized support from designated clinical staff supervised by her physician, including individualized plan of care and coordination with other care providers 2. 24/7 contact phone numbers for assistance for urgent and routine care needs. 3. Standard insurance, coinsurance, copays and deductibles apply for chronic care management only during months in which we provide at least 20  minutes of these services. Most insurances cover these services at 100%, however patients may  be responsible for any copay, coinsurance and/or deductible if applicable. This service may help you avoid the need for more expensive face-to-face services. 4. Only one practitioner may furnish and bill the service in a calendar month. 5. The patient may stop CCM services at any time (effective at the end of the month) by phone call to the office staff.  Patient agreed to services and verbal consent obtained.   The patient verbalized understanding of instructions provided today and agreed to receive a mailed copy of patient instruction and/or educational materials. Telephone follow up appointment with pharmacy team member scheduled for: 1 month  Charlene Brooke, PharmD Clinical Pharmacist [facility] [phone]   Carbohydrate Counting for Diabetes Mellitus, Adult  Carbohydrate counting is a method of keeping track of how many carbohydrates you eat. Eating carbohydrates naturally increases the amount of sugar (glucose) in the blood. Counting how many carbohydrates you eat helps keep your blood glucose within normal limits, which helps you manage your diabetes (diabetes mellitus). It is important to know how many carbohydrates you can safely have in each meal. This is different for every person. A diet and nutrition specialist (registered dietitian) can help you make a meal plan and calculate how many carbohydrates you should have at each meal and snack. Carbohydrates are found in the following foods:  Grains, such as breads and cereals.  Dried beans and soy products.  Starchy vegetables, such as potatoes, peas, and corn.  Fruit and fruit juices.  Milk and yogurt.  Sweets and snack foods, such as cake, cookies, candy, chips, and soft drinks. How do I count carbohydrates? There are two ways to count carbohydrates in food. You can use either of the methods or a combination of both. Reading "Nutrition Facts" on packaged food The "Nutrition Facts" list is  included on the labels of almost all packaged foods and beverages in the [place] It includes:  The serving size.  Information about nutrients in each serving, including the grams (g) of carbohydrate per serving. To use the "Nutrition Facts":  Decide how many servings you will have.  Multiply the number of servings by the number of carbohydrates per serving.  The resulting number is the total amount of carbohydrates that you will be having. Learning standard serving sizes of other foods When you eat carbohydrate foods that are not packaged or do not include "Nutrition Facts" on the label, you need to measure the servings in order to count the amount of carbohydrates:  Measure the foods that you will eat with a food scale or measuring cup, if needed.  Decide how many standard-size servings you will eat.  Multiply the number of servings by 15. Most carbohydrate-rich foods have about 15 g of carbohydrates per serving. ? For example, if you eat 8 oz (170 g) of strawberries, you will have eaten 2 servings and 30 g of carbohydrates (2 servings x 15 g = 30 g).  For foods that have more than one food mixed, such as soups and casseroles, you must count the carbohydrates in each food that is included. The following list contains standard serving sizes of common carbohydrate-rich foods. Each of these servings has about 15 g of carbohydrates:   hamburger bun or  English muffin.   oz (15 mL) syrup.   oz (14 g) jelly.  1 slice of bread.  1 six-inch tortilla.  3 oz (85 g)  cooked rice or pasta.  4 oz (113 g) cooked dried beans.  4 oz (113 g) starchy vegetable, such as peas, corn, or potatoes.  4 oz (113 g) hot cereal.  4 oz (113 g) mashed potatoes or  of a large baked potato.  4 oz (113 g) canned or frozen fruit.  4 oz (120 mL) fruit juice.  4-6 crackers.  6 chicken nuggets.  6 oz (170 g) unsweetened dry cereal.  6 oz (170 g) plain fat-free yogurt or yogurt sweetened with  artificial sweeteners.  8 oz (240 mL) milk.  8 oz (170 g) fresh fruit or one small piece of fruit.  24 oz (680 g) popped popcorn. Example of carbohydrate counting Sample meal  3 oz (85 g) chicken breast.  6 oz (170 g) brown rice.  4 oz (113 g) corn.  8 oz (240 mL) milk.  8 oz (170 g) strawberries with sugar-free whipped topping. Carbohydrate calculation 1. Identify the foods that contain carbohydrates: ? Rice. ? Corn. ? Milk. ? Strawberries. 2. Calculate how many servings you have of each food: ? 2 servings rice. ? 1 serving corn. ? 1 serving milk. ? 1 serving strawberries. 3. Multiply each number of servings by 15 g: ? 2 servings rice x 15 g = 30 g. ? 1 serving corn x 15 g = 15 g. ? 1 serving milk x 15 g = 15 g. ? 1 serving strawberries x 15 g = 15 g. 4. Add together all of the amounts to find the total grams of carbohydrates eaten: ? 30 g + 15 g + 15 g + 15 g = 75 g of carbohydrates total. Summary  Carbohydrate counting is a method of keeping track of how many carbohydrates you eat.  Eating carbohydrates naturally increases the amount of sugar (glucose) in the blood.  Counting how many carbohydrates you eat helps keep your blood glucose within normal limits, which helps you manage your diabetes.  A diet and nutrition specialist (registered dietitian) can help you make a meal plan and calculate how many carbohydrates you should have at each meal and snack. This information is not intended to replace advice given to you by your health care provider. Make sure you discuss any questions you have with your health care provider. Document Revised: 11/23/2016 Document Reviewed: 10/13/2015 Elsevier Patient Education  [place].

## 2019-09-23 NOTE — Progress Notes (Signed)
Subjective:    Patient ID: Kathryn Logan, female    DOB: 1945/10/25, 74 y.o.   MRN: [id number]  HPI The patient is here for follow up of their chronic medical problems, including uncontrolled DM.  She was eating sweets everyday and she has cut them out.  She is taking her medications daily.   Burning in chest:  The pepcid is not doing anything - she takes it BID.  She feels it most of the time.  Eating can make it worse. She has been drinking OJ daily. Anything she eats causes symptoms.   Medications and allergies reviewed with patient and updated if appropriate.  Patient Active Problem List   Diagnosis Date Noted  . AKI (acute kidney injury) ([place]) 11/26/2017  . Anemia 11/26/2017  . Syncope 11/20/2017  . Compression fracture of L1 lumbar vertebra, closed, initial encounter ([place]) 11/20/2017  . Vitamin D deficiency 02/22/2016  . Anxiety 02/22/2016  . CAD (coronary artery disease) 02/21/2016  . GERD (gastroesophageal reflux disease) 04/14/2015  . S/P CABG x 3 12/01/2013  . NSTEMI (non-ST elevated myocardial infarction) ([place]) 11/26/2013  . Type 2 diabetes mellitus with vascular disease ([place]) 08/28/2012  . Hyperlipidemia 12/18/2006  . Osteopenia 10/09/2006    Current Outpatient Medications on File Prior to Visit  Medication Sig Dispense Refill  . aspirin EC 81 MG tablet Take 81 mg by mouth daily.     [name] BIOTIN PO Take 10,000 mcg by mouth daily.     . bisacodyl (DULCOLAX) 5 MG EC tablet Take 5 mg by mouth 3 (three) times a week.     . Calcium Carb-Cholecalciferol (CALCIUM + VITAMIN D3 PO) Take 600 mg by mouth. 600 mg- 25 mcg twice daily    . carvedilol (COREG) 3.125 MG tablet Take 1 tablet (3.125 mg total) by mouth 2 (two) times daily. 180 tablet 3  . Cholecalciferol (VITAMIN D3) 50 MCG (2000 UT) TABS Take by mouth.    . dapagliflozin propanediol (FARXIGA) 10 MG TABS tablet Take 10 mg by mouth daily before breakfast. 90 tablet 1  . famotidine (PEPCID) 20 MG tablet Take 1  tablet by mouth twice daily 180 tablet 0  . glimepiride (AMARYL) 2 MG tablet Take 1 tablet (2 mg total) by mouth daily before breakfast. 30 tablet 1  . glucose blood (ONE TOUCH ULTRA TEST) test strip Check blood sugar daily as directed, DX 250.02 100 each 12  . loratadine (CLARITIN) 10 MG tablet Take 1 tablet (10 mg total) by mouth daily. 90 tablet 3  . metFORMIN (GLUCOPHAGE) 500 MG tablet Take 1 tablet (500 mg total) by mouth 2 (two) times daily with a meal. 180 tablet 1  . ONETOUCH DELICA LANCETS FINE MISC 1 each by Does not apply route as directed. Check blood sugar daily as directed, DX 250.02 HOLD UNTIL PATIENT REQUEST 100 each 5  . rosuvastatin (CRESTOR) 40 MG tablet TAKE 1 TABLET BY MOUTH AT BEDTIME 90 tablet 0  . senna-docusate (SENOKOT-S) 8.6-50 MG tablet Take 1 tablet by mouth at bedtime. 30 tablet 0  . sertraline (ZOLOFT) 50 MG tablet Take 1 tablet (50 mg total) by mouth daily. 90 tablet 1  . vitamin B-12 (CYANOCOBALAMIN) 1000 MCG tablet Take 1,000 mcg by mouth daily.     No current facility-administered medications on file prior to visit.    Past Medical History:  Diagnosis Date  . Compression fracture of L1 lumbar vertebra ([place]) 11/21/2017  . Diabetes mellitus without complication ([place])   .  Gallbladder sludge   . GERD (gastroesophageal reflux disease)   . Heart disease   . Hyperlipidemia   . NSTEMI (non-ST elevated myocardial infarction) ([place]) 11/2013  . Osteopenia   . Syncope 11/2017    Past Surgical History:  Procedure Laterality Date  . CORONARY ARTERY BYPASS GRAFT N/A 12/01/2013   Procedure: CORONARY ARTERY BYPASS GRAFTING (CABG) x three, using left internal mammary artery, and right leg saphenous vein harvested endoscopically (LIMA to LAD, SVG to OM2 , SVG to dRCA);  Surgeon: Grace Isaac, MD;  Location: [facility];  Service: Open Heart Surgery;  Laterality: N/A;  . G 1 P 1    . INTRAOPERATIVE TRANSESOPHAGEAL ECHOCARDIOGRAM N/A 12/01/2013   Procedure: INTRAOPERATIVE  TRANSESOPHAGEAL ECHOCARDIOGRAM;  Surgeon: Grace Isaac, MD;  Location: [facility];  Service: Open Heart Surgery;  Laterality: N/A;  . LEFT HEART CATHETERIZATION WITH CORONARY ANGIOGRAM N/A 11/27/2013   Procedure: LEFT HEART CATHETERIZATION WITH CORONARY ANGIOGRAM;  Surgeon: Sinclair Grooms, MD;  Location: [facility] CATH LAB;  Service: Cardiovascular;  Laterality: N/A;  . no colonoscopy     SOC reviewed  . TONSILLECTOMY AND ADENOIDECTOMY      Social History   Socioeconomic History  . Marital status: Married    Spouse name: Not on file  . Number of children: Not on file  . Years of education: Not on file  . Highest education level: Not on file  Occupational History  . Not on file  Tobacco Use  . Smoking status: Never Smoker  . Smokeless tobacco: Never Used  Substance and Sexual Activity  . Alcohol use: Yes    Comment: Occasionally   . Drug use: No  . Sexual activity: Not on file  Other Topics Concern  . Not on file  Social History Narrative  . Not on file   Social Determinants of Health   Financial Resource Strain:   . Difficulty of Paying Living Expenses:   Food Insecurity:   . Worried About Charity fundraiser in the Last Year:   . Arboriculturist in the Last Year:   Transportation Needs:   . Film/video editor (Medical):   [name] Lack of Transportation (Non-Medical):   Physical Activity:   . Days of Exercise per Week:   . Minutes of Exercise per Session:   Stress:   . Feeling of Stress :   Social Connections:   . Frequency of Communication with Friends and Family:   . Frequency of Social Gatherings with Friends and Family:   . Attends Religious Services:   . Active Member of Clubs or Organizations:   . Attends Archivist Meetings:   [name] Marital Status:     Family History  Problem Relation Age of Onset  . Heart attack Mother 56  . Diabetes Mother   . Stroke Mother   . Hypertension Mother   . Goiter Mother   . Heart attack Father        >55  . Bladder  Cancer Father   . Parkinson's disease Maternal Aunt   . Osteoporosis Sister   . Diabetes Brother     Review of Systems  Constitutional: Negative for chills and fever.  HENT: Negative for trouble swallowing.   Respiratory: Negative for cough, shortness of breath and wheezing.   Cardiovascular: Negative for chest pain, palpitations and leg swelling.  Gastrointestinal: Positive for nausea (little). Negative for abdominal pain.       GERD daily, no black stool  Neurological:  Negative for light-headedness and headaches.       Objective:   Vitals:   09/24/19 0832  BP: 110/70  Pulse: (!) 108  Resp: 16  Temp: 97.7 F (36.5 C)  SpO2: 98%   BP Readings from Last 3 Encounters:  09/24/19 110/70  09/24/19 110/70  08/13/19 118/80   Wt Readings from Last 3 Encounters:  09/24/19 119 lb (54 kg)  09/24/19 119 lb 3.2 oz (54.1 kg)  08/13/19 121 lb 12.8 oz (55.2 kg)   Body mass index is 21.77 kg/m.   Physical Exam    Constitutional: Appears well-developed and well-nourished. No distress.  HENT:  Head: Normocephalic and atraumatic.  Neck: Neck supple. No tracheal deviation present. No thyromegaly present.  No cervical lymphadenopathy Cardiovascular: Normal rate, regular rhythm and normal heart sounds.   No murmur heard. No carotid bruit .  No edema Pulmonary/Chest: Effort normal and breath sounds normal. No respiratory distress. No has no wheezes. No rales.  Skin: Skin is warm and dry. Not diaphoretic.  Psychiatric: Normal mood and affect. Behavior is normal.      Assessment & Plan:    See Problem List for Assessment and Plan of chronic medical problems.    This visit occurred during the SARS-CoV-2 public health emergency.  Safety protocols were in place, including screening questions prior to the visit, additional usage of staff PPE, and extensive cleaning of exam room while observing appropriate contact time as indicated for disinfecting solutions.

## 2019-09-23 NOTE — Assessment & Plan Note (Addendum)
Chronic Uncontrolled, but much better a1c is 7.4% today  She has significantly cut down on sweets She is taking her medications Continue current medications Encouraged regular exercise F/u 3 months

## 2019-09-23 NOTE — Patient Instructions (Addendum)
Your a1c was checked  - it is 7.4%   Medications reviewed and updated.  Changes include :   Pantoprazole 30 minutes prior to lunch.  Your prescription(s) have been submitted to your pharmacy. Please take as directed and contact our office if you believe you are having problem(s) with the medication(s).    Please followup in 3 months

## 2019-09-24 ENCOUNTER — Ambulatory Visit (INDEPENDENT_AMBULATORY_CARE_PROVIDER_SITE_OTHER): Payer: Medicare Other | Admitting: Internal Medicine

## 2019-09-24 ENCOUNTER — Ambulatory Visit: Payer: Medicare Other

## 2019-09-24 ENCOUNTER — Other Ambulatory Visit: Payer: Self-pay

## 2019-09-24 ENCOUNTER — Encounter: Payer: Self-pay | Admitting: Internal Medicine

## 2019-09-24 VITALS — BP 110/70 | HR 108 | Temp 97.7°F | Resp 16 | Ht 62.0 in | Wt 119.2 lb

## 2019-09-24 VITALS — BP 110/70 | HR 108 | Temp 97.7°F | Resp 16 | Ht 62.0 in | Wt 119.0 lb

## 2019-09-24 DIAGNOSIS — E1159 Type 2 diabetes mellitus with other circulatory complications: Secondary | ICD-10-CM | POA: Diagnosis not present

## 2019-09-24 DIAGNOSIS — K219 Gastro-esophageal reflux disease without esophagitis: Secondary | ICD-10-CM | POA: Diagnosis not present

## 2019-09-24 DIAGNOSIS — I25119 Atherosclerotic heart disease of native coronary artery with unspecified angina pectoris: Secondary | ICD-10-CM | POA: Diagnosis not present

## 2019-09-24 DIAGNOSIS — Z0000 Encounter for general adult medical examination without abnormal findings: Secondary | ICD-10-CM

## 2019-09-24 LAB — POCT GLYCOSYLATED HEMOGLOBIN (HGB A1C): Hemoglobin A1C: 7.4 % — AB (ref 4.0–5.6)

## 2019-09-24 MED ORDER — PANTOPRAZOLE SODIUM 40 MG PO TBEC: 40.0000 mg | DELAYED_RELEASE_TABLET | Freq: Every day | ORAL | 1 refills | Status: DC

## 2019-09-24 NOTE — Assessment & Plan Note (Signed)
Chronic Uncontrolled pepcid 20 mg BID not enough - will continue this and add protonix 40 mg daily - 30 min prior to lunch D/c OJ - this is new and may be the cause of the recent flare Fu in 3 months - sooner if GERD is not controlled

## 2019-09-24 NOTE — Progress Notes (Addendum)
Subjective:   Kathryn Logan is a 74 y.o. female who presents for Medicare Annual (Subsequent) preventive examination.  Review of Systems:  No ROS. Medicare Wellness Visit. Additional risk factors are reflected in social history. Cardiac Risk Factors include: advanced age (>42mn, >>56women);diabetes mellitus;dyslipidemia;family history of premature cardiovascular disease  Sleep Patterns: Issues with falling asleep, feels rested on waking and sleeps 6.5 hours nightly. Home Safety/Smoke Alarms: Feels safe in home; uses home alarm. Smoke alarms in place. Living environment: 1-story home. Lives with her husband and two cats; no needs for DME, good support system. Seat Belt Safety/Bike Helmet: Wears seat belt.    Objective:     Vitals: BP 110/70 (BP Location: Left Arm, Patient Position: Sitting, Cuff Size: Normal)   Pulse (!) 108   Temp 97.7 F (36.5 C)   Resp 16   Ht 5' 2"  (1.575 m)   Wt 119 lb 3.2 oz (54.1 kg)   SpO2 98%   BMI 21.80 kg/m   Body mass index is 21.8 kg/m.  Advanced Directives 09/24/2019 11/21/2017 11/26/2013  Does Patient Have a Medical Advance Directive? No No Patient has advance directive, copy not in chart  Type of Advance Directive - - [place] Does patient want to make changes to medical advance directive? - - No change requested  Copy of [place] Sign[place] Chart? - - Copy requested from family  Would patient like information on creating a medical advance directive? Yes (ED - Information included in AVS) No - Patient declined -  Pre-existing out of facility DNR order (yellow form or pink MOST form) - - No    Tobacco Social History   Tobacco Use  Smoking Status Never Smoker  Smokeless Tobacco Never Used     Counseling given: No   Clinical Intake:  Pre-visit preparation completed: Yes  Pain : No/denies pain Pain Score: 0-No pain     BMI - recorded: 21.8 Nutritional Status: BMI of 19-24  Normal Nutritional  Risks: None Diabetes: Yes CBG done?: No Did pt. bring in CBG monitor from home?: No  How often do you need to have someone help you when you read instructions, pamphlets, or other written materials from your doctor or pharmacy?: 1 - Never What is the last grade level you completed in school?: 2 years of college  Interpreter Needed?: No  Information entered by :: Lizzett Nobile N. HLowell Guitar LPN  Past Medical History:  Diagnosis Date  . Compression fracture of L1 lumbar vertebra ([place] 11/21/2017  . Diabetes mellitus without complication ([place]   . Gallbladder sludge   . GERD (gastroesophageal reflux disease)   . Heart disease   . Hyperlipidemia   . NSTEMI (non-ST elevated myocardial infarction) ([facility] 11/2013  . Osteopenia   . Syncope 11/2017   Past Surgical History:  Procedure Laterality Date  . CORONARY ARTERY BYPASS GRAFT N/A 12/01/2013   Procedure: CORONARY ARTERY BYPASS GRAFTING (CABG) x three, using left internal mammary artery, and right leg saphenous vein harvested endoscopically (LIMA to LAD, SVG to OM2 , SVG to dRCA);  Surgeon: EGrace Isaac MD;  [facility]  Service: Open Heart Surgery;  Laterality: N/A;  . G 1 P 1    . INTRAOPERATIVE TRANSESOPHAGEAL ECHOCARDIOGRAM N/A 12/01/2013   Procedure: INTRAOPERATIVE TRANSESOPHAGEAL ECHOCARDIOGRAM;  Surgeon: EGrace Isaac MD;  [place] [facility]  Service: Open Heart Surgery;  Laterality: N/A;  . LEFT HEART CATHETERIZATION WITH CORONARY ANGIOGRAM N/A 11/27/2013   Procedure: LEFT  HEART CATHETERIZATION WITH CORONARY ANGIOGRAM;  Surgeon: Sinclair Grooms, MD;  [facility] CATH LAB;  Service: Cardiovascular;  Laterality: N/A;  . no colonoscopy     SOC reviewed  . TONSILLECTOMY AND ADENOIDECTOMY     Family History  Problem Relation Age of Onset  . Heart attack Mother 70  . Diabetes Mother   . Stroke Mother   . Hypertension Mother   . Goiter Mother   . Heart attack Father        >55  . Bladder Cancer Father   . Parkinson's  disease Maternal Aunt   . Osteoporosis Sister   . Diabetes Brother    Social History   Socioeconomic History  . Marital status: Married    Spouse name: Not on file  . Number of children: Not on file  . Years of education: Not on file  . Highest education level: Not on file  Occupational History  . Not on file  Tobacco Use  . Smoking status: Never Smoker  . Smokeless tobacco: Never Used  Substance and Sexual Activity  . Alcohol use: Yes    Comment: Occasionally   . Drug use: No  . Sexual activity: Not on file  Other Topics Concern  . Not on file  Social History Narrative  . Not on file   Social Determinants of Health   Financial Resource Strain:   . Difficulty of Paying Living Expenses:   Food Insecurity:   . Worried About Charity fundraiser in the Last Year:   . Arboriculturist in the Last Year:   Transportation Needs:   . Film/video editor (Medical):   [name] Lack of Transportation (Non-Medical):   Physical Activity:   . Days of Exercise per Week:   . Minutes of Exercise per Session:   Stress:   . Feeling of Stress :   Social Connections:   . Frequency of Communication with Friends and Family:   . Frequency of Social Gatherings with Friends and Family:   . Attends Religious Services:   . Active Member of Clubs or Organizations:   . Attends Archivist Meetings:   [name] Marital Status:     Outpatient Encounter Medications as of 09/24/2019  Medication Sig  . aspirin EC 81 MG tablet Take 81 mg by mouth daily.   [name] BIOTIN PO Take 10,000 mcg by mouth daily.   . Calcium Carb-Cholecalciferol (CALCIUM + VITAMIN D3 PO) Take 600 mg by mouth. 600 mg- 25 mcg twice daily  . carvedilol (COREG) 3.125 MG tablet Take 1 tablet (3.125 mg total) by mouth 2 (two) times daily.  . Cholecalciferol (VITAMIN D3) 50 MCG (2000 UT) TABS Take by mouth.  . dapagliflozin propanediol (FARXIGA) 10 MG TABS tablet Take 10 mg by mouth daily before breakfast.  . famotidine (PEPCID) 20 MG  tablet Take 1 tablet by mouth twice daily  . glimepiride (AMARYL) 2 MG tablet Take 1 tablet (2 mg total) by mouth daily before breakfast.  . glucose blood (ONE TOUCH ULTRA TEST) test strip Check blood sugar daily as directed, DX 250.02  . loratadine (CLARITIN) 10 MG tablet Take 1 tablet (10 mg total) by mouth daily.  . metFORMIN (GLUCOPHAGE) 500 MG tablet Take 1 tablet (500 mg total) by mouth 2 (two) times daily with a meal.  . ONETOUCH DELICA LANCETS FINE MISC 1 each by Does not apply route as directed. Check blood sugar daily as directed, DX 250.02 HOLD UNTIL PATIENT REQUEST  .  rosuvastatin (CRESTOR) 40 MG tablet TAKE 1 TABLET BY MOUTH AT BEDTIME  . senna-docusate (SENOKOT-S) 8.6-50 MG tablet Take 1 tablet by mouth at bedtime.  . sertraline (ZOLOFT) 50 MG tablet Take 1 tablet (50 mg total) by mouth daily.  . vitamin B-12 (CYANOCOBALAMIN) 1000 MCG tablet Take 1,000 mcg by mouth daily.  . bisacodyl (DULCOLAX) 5 MG EC tablet Take 5 mg by mouth 3 (three) times a week.    No facility-administered encounter medications on file as of 09/24/2019.    Activities of Daily Living In your present state of health, do you have any difficulty performing the following activities: 09/24/2019  Hearing? N  Vision? N  Difficulty concentrating or making decisions? Y  Comment sometimes, but not a lot  Walking or climbing stairs? N  Dressing or bathing? N  Doing errands, shopping? N  Preparing Food and eating ? N  Using the Toilet? N  In the past six months, have you accidently leaked urine? N  Do you have problems with loss of bowel control? N  Managing your Medications? N  Managing your Finances? N  Housekeeping or managing your Housekeeping? N  Some recent data might be hidden    Patient Care Team: Binnie Rail, MD as PCP - General (Internal Medicine) Consuelo Pandy, PA-C as Physician Assistant (Cardiology) Debara Pickett Nadean Corwin, MD as Consulting Physician (Cardiology) Charlton Haws, [facility]  (Pharmacist)    Assessment:   This is a routine wellness examination for [place].  Exercise Activities and Dietary recommendations Current Exercise Habits: Home exercise routine, Type of exercise: walking(Will start walking 5 times a week for 30 minutes.), Time (Minutes): 30, Frequency (Times/Week): 5, Weekly Exercise (Minutes/Week): 150, Intensity: Moderate, Exercise limited by: None identified  Goals    . Diabetes: Goal A1c < 8%     CARE PLAN ENTRY (see longitudinal plan of care for additional care plan information)  Current Barriers:  . Diabetes: uncontrolled; complicated by chronic medical conditions including CAD, HLD, Lab Results  Component Value Date   HGBA1C 11.5 (H) 07/23/2019 .   Lab Results  Component Value Date   CREATININE 0.99 07/23/2019   CREATININE 0.95 01/01/2019   CREATININE 1.24 (H) 11/26/2017 .   [name] No results found for: EGFR . Current antihyperglycemic regimen: Metformin 500 mg twice a day, Farxiga 10 mg daily, glimepiride 2 mg daily . Denies hypoycemic symptoms, including dizziness, lightheadedness, shaking, sweating . denies hyperglycemic symptoms, including polyuria, polydipsia, polyphagia, nocturia, blurred vision, neuropathy . Current exercise: works out every morning . Current blood glucose readings: not checking . Cardiovascular risk reduction: o Current hypertensive regimen: carvedilol 3.125 mg BID o Current hyperlipidemia regimen: rosuvastatin 40 mg o Current antiplatelet regimen: aspirin 81 mg   Pharmacist Clinical Goal(s):  [name] Over the next 30 days, patient will work with PharmD and primary care provider to address elevated A1c and lack of self monitoring  Interventions: . Comprehensive medication review performed, medication list updated in electronic medical record . Discussed importance of taking glimepiride with a meal . Discussed importance of checking blood sugar daily, varying time of day . Discussed consequences of consistently elevated  blood sugars  Patient Self Care Activities:  . Patient will check blood glucose daily , document, and provide at future appointments . Patient will take medications as prescribed . Patient will contact provider with any episodes of hypoglycemia . Patient will report any questions or concerns to provider   Initial goal documentation     . Medication assistance  CARE PLAN ENTRY (see longitudinal plan of care for additional care plan information)  Current Barriers:  . Financial Barriers: patient has [place] insurance and reports copay for Wilder Glade is cost prohibitive at this time  Pharmacist Clinical Goal(s):  [name] Over the next 30 days, patient will work with PharmD and providers to relieve medication access concerns  Interventions: . Comprehensive medication review completed; medication list updated in electronic medical record.  Bertram Savin care team collaboration (see longitudinal plan of care) . Wilder Glade by [employer]: Patient meets income/out of pocket spend criteria for this medication's patient assistance program. Reviewed application process. Patient will provide proof of income, out of pocket spend report, and will sign application. Will collaborate with primary care provider  for their portion of application. Once completed, will submit to [place] patient assistance program.  Patient Self Care Activities:  . Patient will provide necessary portions of application   Initial goal documentation     . Patient Stated     Would like to exercise more and try to eliminate some of my diabetic medications.       Fall Risk Fall Risk  09/24/2019 07/23/2019 04/25/2018 04/13/2017 04/14/2015  Falls in the past year? 1 0 0 No No  Number falls in past yr: 1 0 0 - -  Injury with Fall? 0 0 - - -  Risk for fall due to : Other (Comment) - - - -  Risk for fall due to: Comment tripped over a cord and trying to push garbage can from road. - - - -  Follow up Falls evaluation  completed;Education provided;Falls prevention discussed - - - -   Is the patient's home free of loose throw rugs in walkways, pet beds, electrical cords, etc?   yes      Grab bars in the bathroom? yes      Handrails on the stairs?   yes      Adequate lighting?   yes   Depression Screen PHQ 2/9 Scores 09/24/2019 07/23/2019 04/25/2018 04/13/2017  PHQ - 2 Score 0 0 0 0  PHQ- 9 Score - - 3 -     Cognitive Function     6CIT Screen 09/24/2019  What Year? 0 points  What month? 0 points  What time? 0 points  Count back from 20 0 points  Months in reverse 0 points  Repeat phrase 0 points  Total Score 0    Immunization History  Administered Date(s) Administered  . Fluad Quad(high Dose 65+) 01/22/2019  . Influenza, High Dose Seasonal PF 02/22/2016, 03/14/2017, 04/25/2018  . Influenza,inj,Quad PF,6+ Mos 03/03/2014, 04/14/2015  . Influenza-Unspecified 02/16/2017  . PFIZER SARS-COV-2 Vaccination 06/19/2019, 07/16/2019  . Pneumococcal Conjugate-13 04/13/2017  . Pneumococcal Polysaccharide-23 08/29/2012  . Tdap 08/28/2012    Qualifies for Shingles Vaccine? Yes, will check with local pharmacy.  Screening Tests Health Maintenance  Topic Date Due  . DEXA SCAN  09/11/2014  . MAMMOGRAM  11/13/2014  . COLONOSCOPY  07/22/2020 (Originally 05/03/1997)  . INFLUENZA VACCINE  12/14/2019  . FOOT EXAM  01/02/2020  . HEMOGLOBIN A1C  01/23/2020  . OPHTHALMOLOGY EXAM  07/21/2020  . URINE MICROALBUMIN  07/22/2020  . TETANUS/TDAP  08/29/2022  . COVID-19 Vaccine  Completed  . Hepatitis C Screening  Completed  . PNA vac Low Risk Adult  Completed    Cancer Screenings: Lung: Low Dose CT Chest recommended if Age 65-80 years, 30 pack-year currently smoking OR have quit w/in 15years. Patient does not qualify. Breast:  Up to  date on Mammogram? No   Up to date of Bone Density/Dexa? No Colorectal: never done     Plan:      Reviewed health maintenance screenings with patient today and relevant  education, vaccines, and/or referrals were provided.    Continue doing brain stimulating activities (puzzles, reading, adult coloring books, staying active) to keep memory sharp.    Continue to eat heart healthy diet (full of fruits, vegetables, whole grains, lean protein, water--limit salt, fat, and sugar intake) and increase physical activity as tolerated.  I have personally reviewed and noted the following in the patient's chart:   . Medical and social history . Use of alcohol, tobacco or illicit drugs  . Current medications and supplements . Functional ability and status . Nutritional status . Physical activity . Advanced directives . List of other physicians . Hospitalizations, surgeries, and ER visits in previous 12 months . Vitals . Screenings to include cognitive, depression, and falls . Referrals and appointments  In addition, I have reviewed and discussed with patient certain preventive protocols, quality metrics, and best practice recommendations. A written personalized care plan for preventive services as well as general preventive health recommendations were provided to patient.     Sheral Flow, LPN  12/15/2334 Nurse Health Advisor

## 2019-09-24 NOTE — Patient Instructions (Addendum)
Ms. Tomassetti , Thank you for taking time to come for your Medicare Wellness Visit. I appreciate your ongoing commitment to your health goals. Please review the following plan we discussed and let me know if I can assist you in the future.   Screening recommendations/referrals: Colorectal Screening: no documentation Mammogram: last done 11/12/2012 Bone Density: last done 11/12/2012  Vision and Dental Exams: Recommended annual ophthalmology exams for early detection of glaucoma and other disorders of the eye Recommended annual dental exams for proper oral hygiene  Diabetic Exams: Diabetic Eye Exam: last done 07/22/2019 Diabetic Foot Exam: last done 01/02/2019  Vaccinations: Influenza vaccine: 01/22/2019 Pneumococcal vaccine: completed on 08/29/2012, 04/13/2017 Tdap vaccine: 08/28/2012; due every 10 years Shingles vaccine: Please call your insurance company to determine your out of pocket expense for the Shingrix vaccine. You may receive this vaccine at your local pharmacy. Covid vaccine: completed; [place] 06/19/2019, 07/16/2019  Advanced directives: Advance directives discussed with you today. I have provided a copy for you to complete at home and have notarized. Once this is complete please bring a copy in to our office so we can scan it into your chart.  Goals:  Recommend to drink at least 6-8 8oz glasses of water per day.  Recommend to exercise for at least 150 minutes per week.  Recommend to remove any items from the home that may cause slips or trips.  Recommend to decrease portion sizes by eating 3 small healthy meals and at least 2 healthy snacks per day.  Recommend to begin DASH diet as directed below  Recommend to continue efforts to reduce smoking habits until no longer smoking. Smoking Cessation literature is attached below.  Next appointment: Please schedule your Annual Wellness Visit with your Nurse Health Advisor in one year.  Preventive Care 13 Years and Older, Female Preventive  care refers to lifestyle choices and visits with your health care provider that can promote health and wellness. What does preventive care include?  A yearly physical exam. This is also called an annual well check.  Dental exams once or twice a year.  Routine eye exams. Ask your health care provider how often you should have your eyes checked.  Personal lifestyle choices, including:  Daily care of your teeth and gums.  Regular physical activity.  Eating a healthy diet.  Avoiding tobacco and drug use.  Limiting alcohol use.  Practicing safe sex.  Taking low-dose aspirin every day if recommended by your health care provider.  Taking vitamin and mineral supplements as recommended by your health care provider. What happens during an annual well check? The services and screenings done by your health care provider during your annual well check will depend on your age, overall health, lifestyle risk factors, and family history of disease. Counseling  Your health care provider may ask you questions about your:  Alcohol use.  Tobacco use.  Drug use.  Emotional well-being.  Home and relationship well-being.  Sexual activity.  Eating habits.  History of falls.  Memory and ability to understand (cognition).  Work and work Statistician.  Reproductive health. Screening  You may have the following tests or measurements:  Height, weight, and BMI.  Blood pressure.  Lipid and cholesterol levels. These may be checked every 5 years, or more frequently if you are over 81 years old.  Skin check.  Lung cancer screening. You may have this screening every year starting at age 92 if you have a 30-pack-year history of smoking and currently smoke or have quit within  the past 15 years.  Fecal occult blood test (FOBT) of the stool. You may have this test every year starting at age 43.  Flexible sigmoidoscopy or colonoscopy. You may have a sigmoidoscopy every 5 years or a  colonoscopy every 10 years starting at age 43.  Hepatitis C blood test.  Hepatitis B blood test.  Sexually transmitted disease (STD) testing.  Diabetes screening. This is done by checking your blood sugar (glucose) after you have not eaten for a while (fasting). You may have this done every 1-3 years.  Bone density scan. This is done to screen for osteoporosis. You may have this done starting at age 26.  Mammogram. This may be done every 1-2 years. Talk to your health care provider about how often you should have regular mammograms. Talk with your health care provider about your test results, treatment options, and if necessary, the need for more tests. Vaccines  Your health care provider may recommend certain vaccines, such as:  Influenza vaccine. This is recommended every year.  Tetanus, diphtheria, and acellular pertussis (Tdap, Td) vaccine. You may need a Td booster every 10 years.  Zoster vaccine. You may need this after age 28.  Pneumococcal 13-valent conjugate (PCV13) vaccine. One dose is recommended after age 79.  Pneumococcal polysaccharide (PPSV23) vaccine. One dose is recommended after age 53. Talk to your health care provider about which screenings and vaccines you need and how often you need them. This information is not intended to replace advice given to you by your health care provider. Make sure you discuss any questions you have with your health care provider. Document Released: 05/28/2015 Document Revised: 01/19/2016 Document Reviewed: 03/02/2015 Elsevier Interactive Patient Education  2017 [place] Prevention in the Home Falls can cause injuries. They can happen to people of all ages. There are many things you can do to make your home safe and to help prevent falls. What can I do on the outside of my home?  Regularly fix the edges of walkways and driveways and fix any cracks.  Remove anything that might make you trip as you walk through a door,  such as a raised step or threshold.  Trim any bushes or trees on the path to your home.  Use bright outdoor lighting.  Clear any walking paths of anything that might make someone trip, such as rocks or tools.  Regularly check to see if handrails are loose or broken. Make sure that both sides of any steps have handrails.  Any raised decks and porches should have guardrails on the edges.  Have any leaves, snow, or ice cleared regularly.  Use sand or salt on walking paths during winter.  Clean up any spills in your garage right away. This includes oil or grease spills. What can I do in the bathroom?  Use night lights.  Install grab bars by the toilet and in the tub and shower. Do not use towel bars as grab bars.  Use non-skid mats or decals in the tub or shower.  If you need to sit down in the shower, use a plastic, non-slip stool.  Keep the floor dry. Clean up any water that spills on the floor as soon as it happens.  Remove soap buildup in the tub or shower regularly.  Attach bath mats securely with double-sided non-slip rug tape.  Do not have throw rugs and other things on the floor that can make you trip. What can I do in the bedroom?  Use night  lights.  Make sure that you have a light by your bed that is easy to reach.  Do not use any sheets or blankets that are too big for your bed. They should not hang down onto the floor.  Have a firm chair that has side arms. You can use this for support while you get dressed.  Do not have throw rugs and other things on the floor that can make you trip. What can I do in the kitchen?  Clean up any spills right away.  Avoid walking on wet floors.  Keep items that you use a lot in easy-to-reach places.  If you need to reach something above you, use a strong step stool that has a grab bar.  Keep electrical cords out of the way.  Do not use floor polish or wax that makes floors slippery. If you must use wax, use non-skid  floor wax.  Do not have throw rugs and other things on the floor that can make you trip. What can I do with my stairs?  Do not leave any items on the stairs.  Make sure that there are handrails on both sides of the stairs and use them. Fix handrails that are broken or loose. Make sure that handrails are as long as the stairways.  Check any carpeting to make sure that it is firmly attached to the stairs. Fix any carpet that is loose or worn.  Avoid having throw rugs at the top or bottom of the stairs. If you do have throw rugs, attach them to the floor with carpet tape.  Make sure that you have a light switch at the top of the stairs and the bottom of the stairs. If you do not have them, ask someone to add them for you. What else can I do to help prevent falls?  Wear shoes that:  Do not have high heels.  Have rubber bottoms.  Are comfortable and fit you well.  Are closed at the toe. Do not wear sandals.  If you use a stepladder:  Make sure that it is fully opened. Do not climb a closed stepladder.  Make sure that both sides of the stepladder are locked into place.  Ask someone to hold it for you, if possible.  Clearly mark and make sure that you can see:  Any grab bars or handrails.  First and last steps.  Where the edge of each step is.  Use tools that help you move around (mobility aids) if they are needed. These include:  Canes.  Walkers.  Scooters.  Crutches.  Turn on the lights when you go into a dark area. Replace any light bulbs as soon as they burn out.  Set up your furniture so you have a clear path. Avoid moving your furniture around.  If any of your floors are uneven, fix them.  If there are any pets around you, be aware of where they are.  Review your medicines with your doctor. Some medicines can make you feel dizzy. This can increase your chance of falling. Ask your doctor what other things that you can do to help prevent falls. This  information is not intended to replace advice given to you by your health care provider. Make sure you discuss any questions you have with your health care provider. Document Released: 02/25/2009 Document Revised: 10/07/2015 Document Reviewed: 06/05/2014 Elsevier Interactive Patient Education  2017 [employer].

## 2019-09-29 ENCOUNTER — Other Ambulatory Visit: Payer: Self-pay | Admitting: Internal Medicine

## 2019-10-01 ENCOUNTER — Ambulatory Visit: Payer: Medicare Other | Admitting: Pharmacist

## 2019-10-01 ENCOUNTER — Other Ambulatory Visit: Payer: Self-pay

## 2019-10-01 DIAGNOSIS — E7849 Other hyperlipidemia: Secondary | ICD-10-CM

## 2019-10-01 DIAGNOSIS — K219 Gastro-esophageal reflux disease without esophagitis: Secondary | ICD-10-CM

## 2019-10-01 DIAGNOSIS — E1159 Type 2 diabetes mellitus with other circulatory complications: Secondary | ICD-10-CM

## 2019-10-01 DIAGNOSIS — I25119 Atherosclerotic heart disease of native coronary artery with unspecified angina pectoris: Secondary | ICD-10-CM

## 2019-10-01 NOTE — Chronic Care Management (AMB) (Signed)
Chronic Care Management Pharmacy  Name: Kathryn Logan  MRN: [id number] DOB: [birth year]  Chief Complaint/ HPI  Kathryn Logan,  74 y.o. , female presents for their Initial CCM visit with the clinical pharmacist via telephone due to COVID-19 Pandemic.  PCP : Binnie Rail, MD  Their chronic conditions include: T2DM, CAD (CABG X 3), HLD, GERD   Office Visits: 09/24/19 Dr Quay Burow OV: uncontrolled GERD, added pantoprazole 40 mg daily to famotidine 20 mg BID, and stop OJ. A1c improved 11>7.4%, continue same medications and diet.  07/23/19 Dr Quay Burow OV: A1c remains elevated  Consult Visit: 08/13/19 Dr Debara Pickett (cardiology): cardiology dx stable. Concern about DM, persistently elevated A1c with poor diet, may be Type 1.5 at this point.  Medications: Outpatient Encounter Medications as of 10/01/2019  Medication Sig  . aspirin EC 81 MG tablet Take 81 mg by mouth daily.   [name] BIOTIN PO Take 10,000 mcg by mouth daily.   . bisacodyl (DULCOLAX) 5 MG EC tablet Take 5 mg by mouth 3 (three) times a week.   . Calcium Carb-Cholecalciferol (CALCIUM + VITAMIN D3 PO) Take 600 mg by mouth. 600 mg- 25 mcg twice daily  . carvedilol (COREG) 3.125 MG tablet Take 1 tablet (3.125 mg total) by mouth 2 (two) times daily.  . Cholecalciferol (VITAMIN D3) 50 MCG (2000 UT) TABS Take by mouth.  . dapagliflozin propanediol (FARXIGA) 10 MG TABS tablet Take 10 mg by mouth daily before breakfast.  . famotidine (PEPCID) 20 MG tablet Take 1 tablet by mouth twice daily  . glimepiride (AMARYL) 2 MG tablet Take 1 tablet (2 mg total) by mouth daily before breakfast.  . glucose blood (ONE TOUCH ULTRA TEST) test strip Check blood sugar daily as directed, DX 250.02  . loratadine (CLARITIN) 10 MG tablet Take 1 tablet (10 mg total) by mouth daily.  . metFORMIN (GLUCOPHAGE) 500 MG tablet Take 1 tablet (500 mg total) by mouth 2 (two) times daily with a meal.  . ONETOUCH DELICA LANCETS FINE MISC 1 each by Does not apply route as  directed. Check blood sugar daily as directed, DX 250.02 HOLD UNTIL PATIENT REQUEST  . pantoprazole (PROTONIX) 40 MG tablet Take 1 tablet (40 mg total) by mouth daily.  . rosuvastatin (CRESTOR) 40 MG tablet TAKE 1 TABLET BY MOUTH ONCE DAILY AT BEDTIME  . senna-docusate (SENOKOT-S) 8.6-50 MG tablet Take 1 tablet by mouth at bedtime.  . sertraline (ZOLOFT) 50 MG tablet Take 1 tablet (50 mg total) by mouth daily.  . vitamin B-12 (CYANOCOBALAMIN) 1000 MCG tablet Take 1,000 mcg by mouth daily.   No facility-administered encounter medications on file as of 10/01/2019.     Current Diagnosis/Assessment:  SDOH Interventions     Most Recent Value  SDOH Interventions  SDOH Interventions for the Following Domains  Financial Strain  Financial Strain Interventions  Other (Comment) [pursuing PAP for Farxiga]     Goals Addressed            This Visit's Progress   . Pharmacy Care Plan       CARE PLAN ENTRY  Current Barriers:  . Chronic Disease Management support, education, and care coordination needs related to Hypertension, Hyperlipidemia, Diabetes, Coronary Artery Disease, Gastroesophageal Reflux Disease, and Insomnia   Hyperlipidemia/Coronary artery disease . Pharmacist Clinical Goal(s): o Over the next 90 days, patient will work with PharmD and providers to maintain LDL goal < 70 and BP < 130/80 . Current regimen:  o Rosuvastatin 40 mg daily o  Carvedilol 3.125 mg BID o Aspirin 81 mg daily . Interventions: o Discussed benefits of fish oil for triglycerides and cardiovascular risk reduction o Discussed benefits of statin for cholesterol lowering and prevention of heart attack . Patient self care activities - Over the next 90 days, patient will: o Continue medications as prescribed o Continue low cholesterol diet  Diabetes Hemoglobin A1C  Date Value Ref Range Status  09/24/2019 7.4 (A) 4.0 - 5.6 % Final .  Pharmacist Clinical Goal(s): o Over the next 90 days, patient will work  with PharmD and providers to maintain A1c goal <8% . Current regimen:  o metformin 500 mg twice a day  o glimepiride 2 mg daily,  o Farxiga 10 mg daily  . Interventions: o Discussed benefits of metformin and Farxiga for cardiovascular health o Discussed A1c relation to blood sugar: 7.4% ~ average BG 166 o Discussed symptoms and treatment of hypoglycemia (see attached) o Discussed blood sugar goals: - Fasting BG: 80-130 - 2-hours after eating: <180 - Dangerous low blood sugar: < 70 or if symptoms are present o Pursue patient assistance for free [place] . Patient self care activities - Over the next 90 days, patient will: o Check blood sugar once daily, document, and provide at future appointments o Contact provider with any episodes of hypoglycemia  GERD . Pharmacist Clinical Goal(s) o Over the next 30 days, patient will work with PharmD and providers to optimize antacid regimen . Current regimen:  o Pantoprazole 40 mg daily at lunch o Famotidine 20 mg twice a day 30 min before breakfast and dinner . Interventions: o Discussed 2-4 week waiting period for optimal effect from pantoprazole o If no improvement after 4 weeks, may consider increasing pantoprazole to twice daily . Patient self care activities - Over the next 30 days, patient will: o Monitor symptoms on current regimen  Insomnia . Pharmacist Clinical Goal(s) o Over the next 90 days, patient will work with PharmD and providers to improve sleep . Current regimen:  o No medications . Interventions: o Recommend melatonin 5-10 mg at bedtime . Patient self care activities - Over the next 90 days, patient will: o Try melatonin as above  Medication management . Pharmacist Clinical Goal(s): o Over the next 90 days, patient will work with PharmD and providers to maintain optimal medication adherence . Current pharmacy: [facility] . Interventions o Comprehensive medication review performed. o Continue current medication  management strategy . Patient self care activities - Over the next 90 days, patient will: o Focus on medication adherence by pill box o Take medications as prescribed o Report any questions or concerns to PharmD and/or provider(s)  Initial goal documentation        Hyperlipidemia/CAD   Office blood pressures are  BP Readings from Last 3 Encounters:  09/24/19 110/70  09/24/19 110/70  08/13/19 118/80   Lipid Panel     Component Value Date/Time   CHOL 100 07/23/2019 0747   TRIG 204.0 (H) 07/23/2019 0747   HDL 26.30 (L) 07/23/2019 0747   CHOLHDL 4 07/23/2019 0747   VLDL 40.8 (H) 07/23/2019 0747   LDLCALC 55 01/01/2019 0848   LDLDIRECT 49.0 07/23/2019 0747    The ASCVD Risk score (Goff DC Jr., et al., 2013) failed to calculate for the following reasons:   The patient has a prior MI or stroke diagnosis CABG x 3  Patient has failed these meds in past: atorvastatin Patient is currently controlled on the following medications:   rosuvastatin 40 mg HS,  aspirin 81 mg  Carvedilol 3.125 mg BID  We discussed:  Cholesterol goals, benefits of statin. Also discussed benefits of fish oil for triglycerides, however pt states she does not want to add any more pills.  Plan  Continue current medications and control with diet and exercise   Diabetes   Recent Relevant Labs: Lab Results  Component Value Date/Time   HGBA1C 7.4 (A) 09/24/2019 08:36 AM   HGBA1C 11.5 (H) 07/23/2019 07:47 AM   HGBA1C 12.9 (H) 01/01/2019 08:48 AM   MICROALBUR 3.5 (H) 07/23/2019 07:47 AM   MICROALBUR 2.9 (H) 04/13/2015 11:46 AM    Lab Results  Component Value Date   CREATININE 0.99 07/23/2019   CREATININE 0.95 01/01/2019   CREATININE 1.24 (H) 11/26/2017   Checking BG: Daily   Patient has failed these meds in past: pioglitazone, Januvia Patient is currently uncontrolled on the following medications:   Metformin 500 mg BID,   Glimepiride 2 mg daily,   Farxiga 10 mg daily    Last diabetic  Eye exam:  Lab Results  Component Value Date/Time   HMDIABEYEEXA No Retinopathy 07/22/2019 09:09 AM    Last diabetic Foot exam: No results found for: HMDIABFOOTEX   We discussed: previously A1c very uncontrolled, pt had stopped metformin Oct 2020 due to recalls, pt did not start glimepiride for several weeks until conversation with cardiologist. With improved adherence A1c has improved to < 8%. Pursuing PAP for [place]. Today patient concerned about low blood sugar, discussed s/sx of hypoglycemia, rule of 15s for treatment of hypoglycemia.  Plan  Continue current medications and control with diet and exercise    Anxiety   Patient has failed these meds in past: n/a Patient is currently controlled on the following medications:   sertraline 50 mg daily  We discussed:  Pt is very happy with mood medication  Plan  Continue current medications    GERD/GI   Patient has failed these meds in past: n/a Patient is currently controlled on the following medications:   Pantoprazole 40 mg daily  famotidine 20 mg BID,   bisacodyl 5 mg TIW,   senna-docusate HS  We discussed:  Discussed optimal timing of famotidine 30 min before a meal. Pt has been on pantoprazole for ~ 1 week, noticed some benefit but still having heatburn. Discussed 2-4 weeks for optimal benefit, if still suffering significant heartburn after that may consider increasing to BID.  Plan  Continue current medications  Monitor heartburn for 2-4 weeks on pantoprazole and may consider increasing to BID if still uncontrolled   Health Maintenance   Patient is currently controlled on the following medications:   Vitamin B12,   Biotin,   Vitamin D 2000 IU,   calcium-Vitamin D BID,   loratadine 10 mg daily  We discussed:  Patient is satisfied with current OTC regimen and denies issues. Has no issue with sinus infection since taking Vitamin D. She does report trouble sleeping, recommended melatonin 5-10 mg for help  with sleep.  Plan  Continue current medications  Recommend melatonin 5-10 mg HS   Medication Management   Pt uses [facility] for all medications Use pill box Pt endorses 100% compliance  We discussed: Pt is satisfied with current med management, she reports compliance with meds as prescribed. She is hesitant to start new meds in general and wary of side effects.   Plan  Continue current medication management strategy      Follow up: 1 month phone visit   Charlene Brooke, PharmD  Clinical Pharmacist [facility] [phone]

## 2019-10-01 NOTE — Patient Instructions (Addendum)
Visit Information  Goals Addressed            This Visit's Progress   . Pharmacy Care Plan       CARE PLAN ENTRY  Current Barriers:  . Chronic Disease Management support, education, and care coordination needs related to Hypertension, Hyperlipidemia, Diabetes, Coronary Artery Disease, Gastroesophageal Reflux Disease, and Insomnia   Hyperlipidemia/Coronary artery disease . Pharmacist Clinical Goal(s): o Over the next 90 days, patient will work with PharmD and providers to maintain LDL goal < 70 and BP < 130/80 . Current regimen:  o Rosuvastatin 40 mg daily o Carvedilol 3.125 mg BID o Aspirin 81 mg daily . Interventions: o Discussed benefits of fish oil for triglycerides and cardiovascular risk reduction o Discussed benefits of statin for cholesterol lowering and prevention of heart attack . Patient self care activities - Over the next 90 days, patient will: o Continue medications as prescribed o Continue low cholesterol diet  Diabetes Hemoglobin A1C  Date Value Ref Range Status  09/24/2019 7.4 (A) 4.0 - 5.6 % Final .  Pharmacist Clinical Goal(s): o Over the next 90 days, patient will work with PharmD and providers to maintain A1c goal <8% . Current regimen:  o metformin 500 mg twice a day  o glimepiride 2 mg daily,  o Farxiga 10 mg daily  . Interventions: o Discussed benefits of metformin and Farxiga for cardiovascular health o Discussed A1c relation to blood sugar: 7.4% ~ average BG 166 o Discussed symptoms and treatment of hypoglycemia (see attached) o Discussed blood sugar goals: - Fasting BG: 80-130 - 2-hours after eating: <180 - Dangerous low blood sugar: < 70 or if symptoms are present o Pursue patient assistance for free [place] . Patient self care activities - Over the next 90 days, patient will: o Check blood sugar once daily, document, and provide at future appointments o Contact provider with any episodes of hypoglycemia  GERD . Pharmacist Clinical  Goal(s) o Over the next 30 days, patient will work with PharmD and providers to optimize antacid regimen . Current regimen:  o Pantoprazole 40 mg daily at lunch o Famotidine 20 mg twice a day 30 min before breakfast and dinner . Interventions: o Discussed 2-4 week waiting period for optimal effect from pantoprazole o If no improvement after 4 weeks, may consider increasing pantoprazole to twice daily . Patient self care activities - Over the next 30 days, patient will: o Monitor symptoms on current regimen  Insomnia . Pharmacist Clinical Goal(s) o Over the next 90 days, patient will work with PharmD and providers to improve sleep . Current regimen:  o No medications . Interventions: o Recommend melatonin 5-10 mg at bedtime . Patient self care activities - Over the next 90 days, patient will: o Try melatonin as above  Medication management . Pharmacist Clinical Goal(s): o Over the next 90 days, patient will work with PharmD and providers to maintain optimal medication adherence . Current pharmacy: [facility] . Interventions o Comprehensive medication review performed. o Continue current medication management strategy . Patient self care activities - Over the next 90 days, patient will: o Focus on medication adherence by pill box o Take medications as prescribed o Report any questions or concerns to PharmD and/or provider(s)  Initial goal documentation       The patient verbalized understanding of instructions provided today and agreed to receive a mailed copy of patient instruction and/or educational materials.  Telephone follow up appointment with pharmacy team member scheduled for: 1 month  Charlene Brooke, PharmD Clinical Pharmacist [facility] [place] [phone]  Hypoglycemia Hypoglycemia is when the sugar (glucose) level in your blood is too low. Signs of low blood sugar may include:  Feeling: ? Hungry. ? Worried or nervous (anxious). ? Sweaty  and clammy. ? Confused. ? Dizzy. ? Sleepy. ? Sick to your stomach (nauseous).  Having: ? A fast heartbeat. ? A headache. ? A change in your vision. ? Tingling or no feeling (numbness) around your mouth, lips, or tongue. ? Jerky movements that you cannot control (seizure).  Having trouble with: ? Moving (coordination). ? Sleeping. ? Passing out (fainting). ? Getting upset easily (irritability). Low blood sugar can happen to people who have diabetes and people who do not have diabetes. Low blood sugar can happen quickly, and it can be an emergency. Treating low blood sugar Low blood sugar is often treated by eating or drinking something sugary right away, such as:  Fruit juice, 4-6 oz (120-150 mL).  Regular soda (not diet soda), 4-6 oz (120-150 mL).  Low-fat milk, 4 oz (120 mL).  Several pieces of hard candy.  Sugar or honey, 1 Tbsp (15 mL). Treating low blood sugar if you have diabetes If you can think clearly and swallow safely, follow the 15:15 rule:  Take 15 grams of a fast-acting carb (carbohydrate). Talk with your doctor about how much you should take.  Always keep a source of fast-acting carb with you, such as: ? Sugar tablets (glucose pills). Take 3-4 pills. ? 6-8 pieces of hard candy. ? 4-6 oz (120-150 mL) of fruit juice. ? 4-6 oz (120-150 mL) of regular (not diet) soda. ? 1 Tbsp (15 mL) honey or sugar.  Check your blood sugar 15 minutes after you take the carb.  If your blood sugar is still at or below 70 mg/dL (3.9 mmol/L), take 15 grams of a carb again.  If your blood sugar does not go above 70 mg/dL (3.9 mmol/L) after 3 tries, get help right away.  After your blood sugar goes back to normal, eat a meal or a snack within 1 hour.  Treating very low blood sugar If your blood sugar is at or below 54 mg/dL (3 mmol/L), you have very low blood sugar (severe hypoglycemia). This may also cause:  Passing out.  Jerky movements you cannot control  (seizure).  Losing consciousness (coma). This is an emergency. Do not wait to see if the symptoms will go away. Get medical help right away. Call your local emergency services (911 in the [place] Do not drive yourself to the hospital. If you have very low blood sugar and you cannot eat or drink, you may need a glucagon shot (injection). A family member or friend should learn how to check your blood sugar and how to give you a glucagon shot. Ask your doctor if you need to have a glucagon shot kit at home. Follow these instructions at home: General instructions  Take over-the-counter and prescription medicines only as told by your doctor.  Stay aware of your blood sugar as told by your doctor.  Limit alcohol intake to no more than 1 drink a day for nonpregnant women and 2 drinks a day for men. One drink equals 12 oz of beer (355 mL), 5 oz of wine (148 mL), or 1 oz of hard liquor (44 mL).  Keep all follow-up visits as told by your doctor. This is important. If you have diabetes:   Follow your diabetes care plan as told  by your doctor. Make sure you: ? Know the signs of low blood sugar. ? Take your medicines as told. ? Follow your exercise and meal plan. ? Eat on time. Do not skip meals. ? Check your blood sugar as often as told by your doctor. Always check it before and after exercise. ? Follow your sick day plan when you cannot eat or drink normally. Make this plan ahead of time with your doctor.  Share your diabetes care plan with: ? Your work or school. ? People you live with.  Check your pee (urine) for ketones: ? When you are sick. ? As told by your doctor.  Carry a card or wear jewelry that says you have diabetes. Contact a doctor if:  You have trouble keeping your blood sugar in your target range.  You have low blood sugar often. Get help right away if:  You still have symptoms after you eat or drink something sugary.  Your blood sugar is at or below 54 mg/dL (3  mmol/L).  You have jerky movements that you cannot control.  You pass out. These symptoms may be an emergency. Do not wait to see if the symptoms will go away. Get medical help right away. Call your local emergency services (911 in the [place] Do not drive yourself to the hospital. Summary  Hypoglycemia happens when the level of sugar (glucose) in your blood is too low.  Low blood sugar can happen to people who have diabetes and people who do not have diabetes. Low blood sugar can happen quickly, and it can be an emergency.  Make sure you know the signs of low blood sugar and know how to treat it.  Always keep a source of sugar (fast-acting carb) with you to treat low blood sugar. This information is not intended to replace advice given to you by your health care provider. Make sure you discuss any questions you have with your health care provider. Document Revised: 08/22/2018 Document Reviewed: 06/04/2015 Elsevier Patient Education  2020 [employer].

## 2019-10-21 ENCOUNTER — Other Ambulatory Visit: Payer: Self-pay | Admitting: Internal Medicine

## 2019-10-27 ENCOUNTER — Other Ambulatory Visit: Payer: Self-pay | Admitting: Internal Medicine

## 2019-10-27 ENCOUNTER — Telehealth: Payer: Medicare Other

## 2019-10-27 NOTE — Chronic Care Management (AMB) (Deleted)
Chronic Care Management Pharmacy  Name: Kathryn Logan  MRN: [id number] DOB: [birth year]  Chief Complaint/ HPI  Kathryn Logan,  74 y.o. , female presents for their Follow-Up CCM visit with the clinical pharmacist via telephone due to COVID-19 Pandemic.  PCP : Binnie Rail, MD  Their chronic conditions include: T2DM, CAD (CABG X 3), HLD, GERD   Office Visits: 09/24/19 Dr Quay Burow OV: uncontrolled GERD, added pantoprazole 40 mg daily to famotidine 20 mg BID, and stop OJ. A1c improved 11>7.4%, continue same medications and diet.  07/23/19 Dr Quay Burow OV: A1c remains elevated  Consult Visit: 08/13/19 Dr Debara Pickett (cardiology): cardiology dx stable. Concern about DM, persistently elevated A1c with poor diet, may be Type 1.5 at this point.  Medications: Outpatient Encounter Medications as of 10/27/2019  Medication Sig  . aspirin EC 81 MG tablet Take 81 mg by mouth daily.   [name] BIOTIN PO Take 10,000 mcg by mouth daily.   . bisacodyl (DULCOLAX) 5 MG EC tablet Take 5 mg by mouth 3 (three) times a week.   . Calcium Carb-Cholecalciferol (CALCIUM + VITAMIN D3 PO) Take 600 mg by mouth. 600 mg- 25 mcg twice daily  . carvedilol (COREG) 3.125 MG tablet Take 1 tablet (3.125 mg total) by mouth 2 (two) times daily.  . Cholecalciferol (VITAMIN D3) 50 MCG (2000 UT) TABS Take by mouth.  . dapagliflozin propanediol (FARXIGA) 10 MG TABS tablet Take 10 mg by mouth daily before breakfast.  . famotidine (PEPCID) 20 MG tablet Take 1 tablet by mouth twice daily  . glimepiride (AMARYL) 2 MG tablet TAKE 1 TABLET BY MOUTH ONCE DAILY BEFORE BREAKFAST  . glucose blood (ONE TOUCH ULTRA TEST) test strip Check blood sugar daily as directed, DX 250.02  . loratadine (CLARITIN) 10 MG tablet Take 1 tablet (10 mg total) by mouth daily.  . metFORMIN (GLUCOPHAGE) 500 MG tablet Take 1 tablet (500 mg total) by mouth 2 (two) times daily with a meal.  . ONETOUCH DELICA LANCETS FINE MISC 1 each by Does not apply route as directed.  Check blood sugar daily as directed, DX 250.02 HOLD UNTIL PATIENT REQUEST  . pantoprazole (PROTONIX) 40 MG tablet Take 1 tablet (40 mg total) by mouth daily.  . rosuvastatin (CRESTOR) 40 MG tablet TAKE 1 TABLET BY MOUTH ONCE DAILY AT BEDTIME  . senna-docusate (SENOKOT-S) 8.6-50 MG tablet Take 1 tablet by mouth at bedtime.  . sertraline (ZOLOFT) 50 MG tablet Take 1 tablet (50 mg total) by mouth daily.  . vitamin B-12 (CYANOCOBALAMIN) 1000 MCG tablet Take 1,000 mcg by mouth daily.   No facility-administered encounter medications on file as of 10/27/2019.     Current Diagnosis/Assessment:   Goals Addressed   None     Hyperlipidemia/CAD   Office blood pressures are  BP Readings from Last 3 Encounters:  09/24/19 110/70  09/24/19 110/70  08/13/19 118/80   Lipid Panel     Component Value Date/Time   CHOL 100 07/23/2019 0747   TRIG 204.0 (H) 07/23/2019 0747   HDL 26.30 (L) 07/23/2019 0747   CHOLHDL 4 07/23/2019 0747   VLDL 40.8 (H) 07/23/2019 0747   LDLCALC 55 01/01/2019 0848   LDLDIRECT 49.0 07/23/2019 0747    The ASCVD Risk score (Goff DC Jr., et al., 2013) failed to calculate for the following reasons:   The patient has a prior MI or stroke diagnosis CABG x 3  Patient has failed these meds in past: atorvastatin Patient is currently controlled on the following  medications:   rosuvastatin 40 mg HS,   aspirin 81 mg  Carvedilol 3.125 mg BID  We discussed:  Cholesterol goals, benefits of statin. Also discussed benefits of fish oil for triglycerides, however pt states she does not want to add any more pills.  Plan  Continue current medications and control with diet and exercise   Diabetes   Recent Relevant Labs: Lab Results  Component Value Date/Time   HGBA1C 7.4 (A) 09/24/2019 08:36 AM   HGBA1C 11.5 (H) 07/23/2019 07:47 AM   HGBA1C 12.9 (H) 01/01/2019 08:48 AM   MICROALBUR 3.5 (H) 07/23/2019 07:47 AM   MICROALBUR 2.9 (H) 04/13/2015 11:46 AM    Lab Results    Component Value Date   CREATININE 0.99 07/23/2019   CREATININE 0.95 01/01/2019   CREATININE 1.24 (H) 11/26/2017   Checking BG: Daily   Patient has failed these meds in past: pioglitazone, Januvia Patient is currently uncontrolled on the following medications:   Metformin 500 mg BID,   Glimepiride 2 mg daily,   Farxiga 10 mg daily    Last diabetic Eye exam:  Lab Results  Component Value Date/Time   HMDIABEYEEXA No Retinopathy 07/22/2019 09:09 AM    Last diabetic Foot exam: No results found for: HMDIABFOOTEX   We discussed: previously A1c very uncontrolled, pt had stopped metformin Oct 2020 due to recalls, pt did not start glimepiride for several weeks until conversation with cardiologist. With improved adherence A1c has improved to < 8%. Pursuing PAP for [place]. Today patient concerned about low blood sugar, discussed s/sx of hypoglycemia, rule of 15s for treatment of hypoglycemia.  Plan  Continue current medications and control with diet and exercise    Anxiety   Patient has failed these meds in past: n/a Patient is currently controlled on the following medications:   sertraline 50 mg daily  We discussed:  Pt is very happy with mood medication  Plan  Continue current medications    GERD/GI   Patient has failed these meds in past: n/a Patient is currently controlled on the following medications:   Pantoprazole 40 mg daily  famotidine 20 mg BID,   bisacodyl 5 mg TIW,   senna-docusate HS  We discussed:  Discussed optimal timing of famotidine 30 min before a meal. Pt has been on pantoprazole for ~ 1 week, noticed some benefit but still having heatburn. Discussed 2-4 weeks for optimal benefit, if still suffering significant heartburn after that may consider increasing to BID.  Plan  Continue current medications  Monitor heartburn for 2-4 weeks on pantoprazole and may consider increasing to BID if still uncontrolled   Health Maintenance   Patient is  currently controlled on the following medications:   Vitamin B12,   Biotin,   Vitamin D 2000 IU,   calcium-Vitamin D BID,   loratadine 10 mg daily  We discussed:  Patient is satisfied with current OTC regimen and denies issues. Has no issue with sinus infection since taking Vitamin D. She does report trouble sleeping, recommended melatonin 5-10 mg for help with sleep.  Plan  Continue current medications  Recommend melatonin 5-10 mg HS   Medication Management   Pt uses [facility] for all medications Use pill box Pt endorses 100% compliance  We discussed: Pt is satisfied with current med management, she reports compliance with meds as prescribed. She is hesitant to start new meds in general and wary of side effects.   Plan  Continue current medication management strategy      Follow  up: *** month phone visit   Charlene Brooke, PharmD Clinical Pharmacist [facility] at [facility] [phone]

## 2019-11-18 ENCOUNTER — Other Ambulatory Visit: Payer: Self-pay | Admitting: Internal Medicine

## 2019-11-26 ENCOUNTER — Other Ambulatory Visit: Payer: Self-pay | Admitting: Internal Medicine

## 2019-12-25 ENCOUNTER — Other Ambulatory Visit: Payer: Self-pay | Admitting: Internal Medicine

## 2019-12-31 ENCOUNTER — Telehealth: Payer: Self-pay | Admitting: Pharmacist

## 2019-12-31 NOTE — Progress Notes (Signed)
°  12/31/2019 Name: Kathryn Logan MRN: [id number] DOB: Oct 04, 1945 Kathryn Logan is a 74 y.o. year old female who is a primary care patient of Burns, Claudina Lick, MD.   Patient has [facility] insurance and reports copay for [place] is cost prohibitive at this time.  Reviewed application process for [place] [employer] [place] program. Patient meets income/out of pocket spend criteria for the program. Patient will provide proof of income, out of pocket spend report, and will sign application. Will collaborate with prescriber Dr Quay Burow for the provider portion of application. Once completed, application will be submitted via Fax   Patient assistance program Fax number: [place], [facility]

## 2020-01-06 ENCOUNTER — Ambulatory Visit: Payer: Medicare Other | Admitting: Internal Medicine

## 2020-01-21 ENCOUNTER — Other Ambulatory Visit: Payer: Medicare Other

## 2020-01-27 ENCOUNTER — Ambulatory Visit: Payer: Medicare Other | Admitting: Internal Medicine

## 2020-01-28 ENCOUNTER — Ambulatory Visit: Payer: Medicare Other | Admitting: Internal Medicine

## 2020-02-09 ENCOUNTER — Telehealth: Payer: Self-pay | Admitting: Pharmacist

## 2020-02-09 NOTE — Progress Notes (Signed)
Kathryn Logan Patient assistance: [employer]  Patient's husband brought income and prescription cost documents to office along with signed application. Faxed completed application to [employer] today.

## 2020-02-10 ENCOUNTER — Other Ambulatory Visit: Payer: Medicare Other

## 2020-02-12 ENCOUNTER — Ambulatory Visit: Payer: Medicare Other | Admitting: Internal Medicine

## 2020-03-01 ENCOUNTER — Telehealth: Payer: Self-pay | Admitting: Pharmacist

## 2020-03-01 NOTE — Progress Notes (Signed)
    Chronic Care Management Pharmacy Assistant   Name: Kathryn Logan  MRN: [id number] DOB: 08-Mar-1946  Reason for Encounter: PAP call  PCP : Binnie Rail, MD  Allergies:  No Known Allergies  Medications: Outpatient Encounter Medications as of 03/01/2020  Medication Sig  . aspirin EC 81 MG tablet Take 81 mg by mouth daily.   [name] BIOTIN PO Take 10,000 mcg by mouth daily.   . bisacodyl (DULCOLAX) 5 MG EC tablet Take 5 mg by mouth 3 (three) times a week.   . Calcium Carb-Cholecalciferol (CALCIUM + VITAMIN D3 PO) Take 600 mg by mouth. 600 mg- 25 mcg twice daily  . carvedilol (COREG) 3.125 MG tablet Take 1 tablet (3.125 mg total) by mouth 2 (two) times daily.  . Cholecalciferol (VITAMIN D3) 50 MCG (2000 UT) TABS Take by mouth.  . famotidine (PEPCID) 20 MG tablet Take 1 tablet by mouth twice daily  . FARXIGA 10 MG TABS tablet TAKE 1 TABLET BY MOUTH ONCE DAILY BEFORE  BREAKFAST  . glimepiride (AMARYL) 2 MG tablet TAKE 1 TABLET BY MOUTH ONCE DAILY BEFORE BREAKFAST  . glucose blood (ONE TOUCH ULTRA TEST) test strip Check blood sugar daily as directed, DX 250.02  . loratadine (CLARITIN) 10 MG tablet Take 1 tablet (10 mg total) by mouth daily.  . metFORMIN (GLUCOPHAGE) 500 MG tablet Take 1 tablet (500 mg total) by mouth 2 (two) times daily with a meal.  . ONETOUCH DELICA LANCETS FINE MISC 1 each by Does not apply route as directed. Check blood sugar daily as directed, DX 250.02 HOLD UNTIL PATIENT REQUEST  . pantoprazole (PROTONIX) 40 MG tablet Take 1 tablet (40 mg total) by mouth daily.  . rosuvastatin (CRESTOR) 40 MG tablet TAKE 1 TABLET BY MOUTH ONCE DAILY AT BEDTIME  . senna-docusate (SENOKOT-S) 8.6-50 MG tablet Take 1 tablet by mouth at bedtime.  . sertraline (ZOLOFT) 50 MG tablet Take 1 tablet by mouth once daily  . vitamin B-12 (CYANOCOBALAMIN) 1000 MCG tablet Take 1,000 mcg by mouth daily.   No facility-administered encounter medications on file as of 03/01/2020.    Current  Diagnosis: Patient Active Problem List   Diagnosis Date Noted  . AKI (acute kidney injury) ([place]) 11/26/2017  . Anemia 11/26/2017  . Syncope 11/20/2017  . Compression fracture of L1 lumbar vertebra, closed, initial encounter ([place]) 11/20/2017  . Vitamin D deficiency 02/22/2016  . Anxiety 02/22/2016  . CAD (coronary artery disease) 02/21/2016  . GERD (gastroesophageal reflux disease) 04/14/2015  . S/P CABG x 3 12/01/2013  . NSTEMI (non-ST elevated myocardial infarction) ([place]) 11/26/2013  . Type 2 diabetes mellitus with vascular disease ([place]) 08/28/2012  . Hyperlipidemia 12/18/2006  . Osteopenia 10/09/2006    Goals Addressed   None     Follow-Up:  Pharmacist Review   Following up with [employer] to see if they received or processed the patient's, patient assistance form for the medication Farxiga. Spoke with representative Dori, who states that the application was never received. Will forward information to clinical pharmacist Mendel Ryder. Representative gave another fax number for form to be re-faxed to [phone].  Rosendo Gros, [facility]  Practice Team Manager/ CPA (Clinical Pharmacist Assistant) [phone]

## 2020-03-09 ENCOUNTER — Other Ambulatory Visit: Payer: Medicare Other

## 2020-03-10 NOTE — Patient Instructions (Addendum)
Flu immunization administered today.    Medications reviewed and updated.  Changes include :   none  Your prescription(s) have been submitted to your pharmacy. Please take as directed and contact our office if you believe you are having problem(s) with the medication(s).    Please followup in 6 months

## 2020-03-10 NOTE — Progress Notes (Signed)
Subjective:    Patient ID: Kathryn Logan, female    DOB: Mar 24, 1946, 74 y.o.   MRN: [id number]  HPI The patient is here for follow up of their chronic medical problems, including DM, CAD, hyperlipidemia, GERD, anxiety  She is taking all of her medications as prescribed.    She is exercising regularly.  She walks.    Medications and allergies reviewed with patient and updated if appropriate.  Patient Active Problem List   Diagnosis Date Noted  . AKI (acute kidney injury) ([place]) 11/26/2017  . Anemia 11/26/2017  . Syncope 11/20/2017  . Compression fracture of L1 lumbar vertebra, closed, initial encounter ([place]) 11/20/2017  . Vitamin D deficiency 02/22/2016  . Anxiety 02/22/2016  . CAD (coronary artery disease) 02/21/2016  . GERD (gastroesophageal reflux disease) 04/14/2015  . S/P CABG x 3 12/01/2013  . NSTEMI (non-ST elevated myocardial infarction) ([place]) 11/26/2013  . Type 2 diabetes mellitus with vascular disease ([place]) 08/28/2012  . Hyperlipidemia 12/18/2006  . Osteopenia 10/09/2006    Current Outpatient Medications on File Prior to Visit  Medication Sig Dispense Refill  . aspirin EC 81 MG tablet Take 81 mg by mouth daily.     [name] BIOTIN PO Take 10,000 mcg by mouth daily.     . bisacodyl (DULCOLAX) 5 MG EC tablet Take 5 mg by mouth 3 (three) times a week.     . Calcium Carb-Cholecalciferol (CALCIUM + VITAMIN D3 PO) Take 600 mg by mouth. 600 mg- 25 mcg twice daily    . carvedilol (COREG) 3.125 MG tablet Take 1 tablet (3.125 mg total) by mouth 2 (two) times daily. 180 tablet 3  . Cholecalciferol (VITAMIN D3) 50 MCG (2000 UT) TABS Take by mouth.    . famotidine (PEPCID) 20 MG tablet Take 1 tablet by mouth twice daily 180 tablet 0  . FARXIGA 10 MG TABS tablet TAKE 1 TABLET BY MOUTH ONCE DAILY BEFORE  BREAKFAST 90 tablet 0  . glimepiride (AMARYL) 2 MG tablet TAKE 1 TABLET BY MOUTH ONCE DAILY BEFORE BREAKFAST 30 tablet 0  . glucose blood (ONE TOUCH ULTRA TEST) test strip Check  blood sugar daily as directed, DX 250.02 100 each 12  . loratadine (CLARITIN) 10 MG tablet Take 1 tablet (10 mg total) by mouth daily. 90 tablet 3  . metFORMIN (GLUCOPHAGE) 500 MG tablet Take 1 tablet (500 mg total) by mouth 2 (two) times daily with a meal. 180 tablet 1  . ONETOUCH DELICA LANCETS FINE MISC 1 each by Does not apply route as directed. Check blood sugar daily as directed, DX 250.02 HOLD UNTIL PATIENT REQUEST 100 each 5  . pantoprazole (PROTONIX) 40 MG tablet Take 1 tablet (40 mg total) by mouth daily. 90 tablet 1  . rosuvastatin (CRESTOR) 40 MG tablet TAKE 1 TABLET BY MOUTH ONCE DAILY AT BEDTIME 90 tablet 3  . senna-docusate (SENOKOT-S) 8.6-50 MG tablet Take 1 tablet by mouth at bedtime. 30 tablet 0  . sertraline (ZOLOFT) 50 MG tablet Take 1 tablet by mouth once daily 90 tablet 2  . vitamin B-12 (CYANOCOBALAMIN) 1000 MCG tablet Take 1,000 mcg by mouth daily.     No current facility-administered medications on file prior to visit.    Past Medical History:  Diagnosis Date  . Compression fracture of L1 lumbar vertebra ([place]) 11/21/2017  . Diabetes mellitus without complication ([place])   . Gallbladder sludge   . GERD (gastroesophageal reflux disease)   . Heart disease   . Hyperlipidemia   .  NSTEMI (non-ST elevated myocardial infarction) ([place]) 11/2013  . Osteopenia   . Syncope 11/2017    Past Surgical History:  Procedure Laterality Date  . CORONARY ARTERY BYPASS GRAFT N/A 12/01/2013   Procedure: CORONARY ARTERY BYPASS GRAFTING (CABG) x three, using left internal mammary artery, and right leg saphenous vein harvested endoscopically (LIMA to LAD, SVG to OM2 , SVG to dRCA);  Surgeon: Grace Isaac, MD;  Location: [facility];  Service: Open Heart Surgery;  Laterality: N/A;  . G 1 P 1    . INTRAOPERATIVE TRANSESOPHAGEAL ECHOCARDIOGRAM N/A 12/01/2013   Procedure: INTRAOPERATIVE TRANSESOPHAGEAL ECHOCARDIOGRAM;  Surgeon: Grace Isaac, MD;  Location: [facility];  Service: Open Heart  Surgery;  Laterality: N/A;  . LEFT HEART CATHETERIZATION WITH CORONARY ANGIOGRAM N/A 11/27/2013   Procedure: LEFT HEART CATHETERIZATION WITH CORONARY ANGIOGRAM;  Surgeon: Sinclair Grooms, MD;  Location: [facility] CATH LAB;  Service: Cardiovascular;  Laterality: N/A;  . no colonoscopy     SOC reviewed  . TONSILLECTOMY AND ADENOIDECTOMY      Social History   Socioeconomic History  . Marital status: Married    Spouse name: Not on file  . Number of children: Not on file  . Years of education: Not on file  . Highest education level: Not on file  Occupational History  . Not on file  Tobacco Use  . Smoking status: Never Smoker  . Smokeless tobacco: Never Used  Vaping Use  . Vaping Use: Never used  Substance and Sexual Activity  . Alcohol use: Yes    Comment: Occasionally   . Drug use: No  . Sexual activity: Not on file  Other Topics Concern  . Not on file  Social History Narrative  . Not on file   Social Determinants of Health   Financial Resource Strain: Medium Risk  . Difficulty of Paying Living Expenses: Somewhat hard  Food Insecurity:   . Worried About Charity fundraiser in the Last Year: Not on file  . Ran Out of Food in the Last Year: Not on file  Transportation Needs:   . Lack of Transportation (Medical): Not on file  . Lack of Transportation (Non-Medical): Not on file  Physical Activity:   . Days of Exercise per Week: Not on file  . Minutes of Exercise per Session: Not on file  Stress:   . Feeling of Stress : Not on file  Social Connections:   . Frequency of Communication with Friends and Family: Not on file  . Frequency of Social Gatherings with Friends and Family: Not on file  . Attends Religious Services: Not on file  . Active Member of Clubs or Organizations: Not on file  . Attends Archivist Meetings: Not on file  . Marital Status: Not on file    Family History  Problem Relation Age of Onset  . Heart attack Mother 2  . Diabetes Mother   .  Stroke Mother   . Hypertension Mother   . Goiter Mother   . Heart attack Father        >55  . Bladder Cancer Father   . Parkinson's disease Maternal Aunt   . Osteoporosis Sister   . Diabetes Brother     Review of Systems  Constitutional: Negative for chills and fever.  Respiratory: Negative for cough, shortness of breath and wheezing.   Cardiovascular: Negative for chest pain, palpitations and leg swelling.  Neurological: Negative for light-headedness and headaches.       Objective:  Vitals:   03/11/20 0955  BP: 108/72  Pulse: 80  Temp: 98 F (36.7 C)  SpO2: 97%   BP Readings from Last 3 Encounters:  03/11/20 108/72  09/24/19 110/70  09/24/19 110/70   Wt Readings from Last 3 Encounters:  03/11/20 127 lb (57.6 kg)  09/24/19 119 lb (54 kg)  09/24/19 119 lb 3.2 oz (54.1 kg)   Body mass index is 23.23 kg/m.   Physical Exam    Constitutional: Appears well-developed and well-nourished. No distress.  HENT:  Head: Normocephalic and atraumatic.  Neck: Neck supple. No tracheal deviation present. No thyromegaly present.  No cervical lymphadenopathy Cardiovascular: Normal rate, regular rhythm and normal heart sounds.   No murmur heard. No carotid bruit .  No edema Pulmonary/Chest: Effort normal and breath sounds normal. No respiratory distress. No has no wheezes. No rales.  Skin: Skin is warm and dry. Not diaphoretic.  Psychiatric: Normal mood and affect. Behavior is normal.      Assessment & Plan:    See Problem List for Assessment and Plan of chronic medical problems.    This visit occurred during the SARS-CoV-2 public health emergency.  Safety protocols were in place, including screening questions prior to the visit, additional usage of staff PPE, and extensive cleaning of exam room while observing appropriate contact time as indicated for disinfecting solutions.

## 2020-03-11 ENCOUNTER — Other Ambulatory Visit: Payer: Self-pay

## 2020-03-11 ENCOUNTER — Encounter: Payer: Self-pay | Admitting: Internal Medicine

## 2020-03-11 ENCOUNTER — Other Ambulatory Visit (INDEPENDENT_AMBULATORY_CARE_PROVIDER_SITE_OTHER): Payer: Medicare Other

## 2020-03-11 ENCOUNTER — Ambulatory Visit (INDEPENDENT_AMBULATORY_CARE_PROVIDER_SITE_OTHER): Payer: Medicare Other | Admitting: Internal Medicine

## 2020-03-11 VITALS — BP 108/72 | HR 80 | Temp 98.0°F | Ht 62.0 in | Wt 127.0 lb

## 2020-03-11 DIAGNOSIS — Z23 Encounter for immunization: Secondary | ICD-10-CM

## 2020-03-11 DIAGNOSIS — E782 Mixed hyperlipidemia: Secondary | ICD-10-CM

## 2020-03-11 DIAGNOSIS — E1159 Type 2 diabetes mellitus with other circulatory complications: Secondary | ICD-10-CM | POA: Diagnosis not present

## 2020-03-11 DIAGNOSIS — D649 Anemia, unspecified: Secondary | ICD-10-CM | POA: Diagnosis not present

## 2020-03-11 DIAGNOSIS — F419 Anxiety disorder, unspecified: Secondary | ICD-10-CM

## 2020-03-11 DIAGNOSIS — E038 Other specified hypothyroidism: Secondary | ICD-10-CM

## 2020-03-11 DIAGNOSIS — K219 Gastro-esophageal reflux disease without esophagitis: Secondary | ICD-10-CM | POA: Diagnosis not present

## 2020-03-11 DIAGNOSIS — I25119 Atherosclerotic heart disease of native coronary artery with unspecified angina pectoris: Secondary | ICD-10-CM | POA: Diagnosis not present

## 2020-03-11 LAB — COMPREHENSIVE METABOLIC PANEL
ALT: 9 U/L (ref 0–35)
AST: 12 U/L (ref 0–37)
Albumin: 4.2 g/dL (ref 3.5–5.2)
Alkaline Phosphatase: 77 U/L (ref 39–117)
BUN: 35 mg/dL — ABNORMAL HIGH (ref 6–23)
CO2: 20 mEq/L (ref 19–32)
Calcium: 10 mg/dL (ref 8.4–10.5)
Chloride: 107 mEq/L (ref 96–112)
Creatinine, Ser: 1.14 mg/dL (ref 0.40–1.20)
GFR: 47.98 mL/min — ABNORMAL LOW (ref >60.00)
Glucose, Bld: 231 mg/dL — ABNORMAL HIGH (ref 70–99)
Potassium: 4.7 mEq/L (ref 3.5–5.1)
Sodium: 138 mEq/L (ref 135–145)
Total Bilirubin: 0.5 mg/dL (ref 0.2–1.2)
Total Protein: 6.9 g/dL (ref 6.0–8.3)

## 2020-03-11 LAB — CBC WITH DIFFERENTIAL/PLATELET
Basophils Absolute: 0.1 10*3/uL (ref 0.0–0.1)
Basophils Relative: 0.8 % (ref 0.0–3.0)
Eosinophils Absolute: 0.3 10*3/uL (ref 0.0–0.7)
Eosinophils Relative: 3.1 % (ref 0.0–5.0)
HCT: 37.7 % (ref 36.0–46.0)
Hemoglobin: 12.3 g/dL (ref 12.0–15.0)
Lymphocytes Relative: 22.3 % (ref 12.0–46.0)
Lymphs Abs: 2.3 10*3/uL (ref 0.7–4.0)
MCHC: 32.5 g/dL (ref 30.0–36.0)
MCV: 80.7 fl (ref 78.0–100.0)
Monocytes Absolute: 0.6 10*3/uL (ref 0.1–1.0)
Monocytes Relative: 5.8 % (ref 3.0–12.0)
Neutro Abs: 6.9 10*3/uL (ref 1.4–7.7)
Neutrophils Relative %: 68 % (ref 43.0–77.0)
Platelets: 256 10*3/uL (ref 150.0–400.0)
RBC: 4.67 Mil/uL (ref 3.87–5.11)
RDW: 16.8 % — ABNORMAL HIGH (ref 11.5–15.5)
WBC: 10.1 10*3/uL (ref 4.0–10.5)

## 2020-03-11 LAB — LIPID PANEL
Cholesterol: 116 mg/dL (ref 0–200)
HDL: 33.2 mg/dL — ABNORMAL LOW (ref >39.00)
LDL Cholesterol: 51 mg/dL (ref 0–99)
NonHDL: 82.42
Total CHOL/HDL Ratio: 3
Triglycerides: 155 mg/dL — ABNORMAL HIGH (ref 0.0–149.0)
VLDL: 31 mg/dL (ref 0.0–40.0)

## 2020-03-11 LAB — HEMOGLOBIN A1C: Hgb A1c MFr Bld: 9.2 % — ABNORMAL HIGH (ref 4.6–6.5)

## 2020-03-11 LAB — TSH: TSH: 5.07 u[IU]/mL — ABNORMAL HIGH (ref 0.35–4.50)

## 2020-03-11 NOTE — Assessment & Plan Note (Addendum)
Chronic Following with cardiology No symptoms suggestive of angina continue current medications Check cbc, cmp, tsh

## 2020-03-11 NOTE — Assessment & Plan Note (Addendum)
Chronic Check lipid panel  Continue crestor 40 mg daily Regular exercise and healthy diet encouraged  

## 2020-03-11 NOTE — Assessment & Plan Note (Signed)
Chronic Check cbc

## 2020-03-11 NOTE — Assessment & Plan Note (Signed)
Chronic Check a1c Continue metformin 500 mg BID with meals, farxiga 10 mg daily and glimepiride 2 mg daily Low sugar / carb diet Stressed regular exercise

## 2020-03-11 NOTE — Assessment & Plan Note (Signed)
Chronic Controlled, stable Continue sertraline 50 mg daily  

## 2020-03-11 NOTE — Assessment & Plan Note (Addendum)
Chronic GERD controlled Continue pantoprazole 40 mg daily and pepcid 20 mg BID

## 2020-03-12 NOTE — Progress Notes (Signed)
Received fax from [employer] that [place] PAP is approved through 05/14/2020 and medication is being shipped to patient.

## 2020-03-12 NOTE — Addendum Note (Signed)
Addended by: Marcina Millard on: 03/12/2020 08:41 AM   Modules accepted: Orders

## 2020-04-06 ENCOUNTER — Other Ambulatory Visit: Payer: Self-pay | Admitting: Internal Medicine

## 2020-04-07 ENCOUNTER — Telehealth: Payer: Medicare Other

## 2020-04-07 NOTE — Chronic Care Management (AMB) (Deleted)
Chronic Care Management Pharmacy  Name: Kathryn Logan  MRN: [id number] DOB: [birth year]  Chief Complaint/ HPI  Kathryn Logan,  74 y.o. , female presents for their Initial CCM visit with the clinical pharmacist via telephone due to COVID-19 Pandemic.  PCP : Kathryn Rail, MD  Their chronic conditions include: T2DM, CAD (CABG X 3), HLD, GERD   Office Visits: 03/11/20 Dr Kathryn Logan OV:   09/24/19 Dr Kathryn Logan OV: uncontrolled GERD, added pantoprazole 40 mg daily to famotidine 20 mg BID, and stop OJ. A1c improved 11>7.4%, continue same medications and diet.  07/23/19 Dr Kathryn Logan OV: A1c remains elevated  Consult Visit: 08/13/19 Dr Kathryn Logan (cardiology): cardiology dx stable. Concern about DM, persistently elevated A1c with poor diet, may be Type 1.5 at this point.  Medications: Outpatient Encounter Medications as of 04/07/2020  Medication Sig  . aspirin EC 81 MG tablet Take 81 mg by mouth daily.   [name] BIOTIN PO Take 10,000 mcg by mouth daily.   . bisacodyl (DULCOLAX) 5 MG EC tablet Take 5 mg by mouth 3 (three) times a week.   . Calcium Carb-Cholecalciferol (CALCIUM + VITAMIN D3 PO) Take 600 mg by mouth. 600 mg- 25 mcg twice daily  . carvedilol (COREG) 3.125 MG tablet Take 1 tablet (3.125 mg total) by mouth 2 (two) times daily.  . Cholecalciferol (VITAMIN D3) 50 MCG (2000 UT) TABS Take by mouth.  . famotidine (PEPCID) 20 MG tablet Take 1 tablet by mouth twice daily  . FARXIGA 10 MG TABS tablet TAKE 1 TABLET BY MOUTH ONCE DAILY BEFORE  BREAKFAST  . glimepiride (AMARYL) 2 MG tablet TAKE 1 TABLET BY MOUTH ONCE DAILY BEFORE BREAKFAST  . glucose blood (ONE TOUCH ULTRA TEST) test strip Check blood sugar daily as directed, DX 250.02  . loratadine (CLARITIN) 10 MG tablet Take 1 tablet (10 mg total) by mouth daily.  . metFORMIN (GLUCOPHAGE) 500 MG tablet Take 1 tablet (500 mg total) by mouth 2 (two) times daily with a meal.  . ONETOUCH DELICA LANCETS FINE MISC 1 each by Does not apply route as directed.  Check blood sugar daily as directed, DX 250.02 HOLD UNTIL PATIENT REQUEST  . pantoprazole (PROTONIX) 40 MG tablet Take 1 tablet (40 mg total) by mouth daily.  . rosuvastatin (CRESTOR) 40 MG tablet TAKE 1 TABLET BY MOUTH ONCE DAILY AT BEDTIME  . senna-docusate (SENOKOT-S) 8.6-50 MG tablet Take 1 tablet by mouth at bedtime.  . sertraline (ZOLOFT) 50 MG tablet Take 1 tablet by mouth once daily  . vitamin B-12 (CYANOCOBALAMIN) 1000 MCG tablet Take 1,000 mcg by mouth daily.   No facility-administered encounter medications on file as of 04/07/2020.     Current Diagnosis/Assessment:   Goals Addressed   None     Hyperlipidemia/CAD   LDL goal < 70 Hx CABG X 3  Office blood pressures are  BP Readings from Last 3 Encounters:  03/11/20 108/72  09/24/19 110/70  09/24/19 110/70   Last lipids Lab Results  Component Value Date   CHOL 116 03/11/2020   HDL 33.20 (L) 03/11/2020   LDLCALC 51 03/11/2020   LDLDIRECT 49.0 07/23/2019   TRIG 155.0 (H) 03/11/2020   CHOLHDL 3 03/11/2020   The ASCVD Risk score Kathryn Logan DC Jr., et al., 2013) failed to calculate for the following reasons:   The patient has a prior MI or stroke diagnosis   Patient has failed these meds in past: atorvastatin Patient is currently controlled on the following medications:   rosuvastatin  40 mg HS,   aspirin 81 mg  Carvedilol 3.125 mg BID  We discussed:  Cholesterol goals, benefits of statin. Also discussed benefits of fish oil for triglycerides, however pt states she does not want to add any more pills.  Plan  Continue current medications and control with diet and exercise   Diabetes   A1c goal < 8%  Recent Relevant Labs: Lab Results  Component Value Date/Time   HGBA1C 9.2 (H) 03/11/2020 09:13 AM   HGBA1C 7.4 (A) 09/24/2019 08:36 AM   HGBA1C 11.5 (H) 07/23/2019 07:47 AM   MICROALBUR 3.5 (H) 07/23/2019 07:47 AM   MICROALBUR 2.9 (H) 04/13/2015 11:46 AM    Lab Results  Component Value Date    CREATININE 1.14 03/11/2020   BUN 35 (H) 03/11/2020   GFR 47.98 (L) 03/11/2020   GFRNONAA 37 (L) 11/23/2017   GFRAA 43 (L) 11/23/2017   NA 138 03/11/2020   K 4.7 03/11/2020   CALCIUM 10.0 03/11/2020   CO2 20 03/11/2020   Last diabetic Eye exam:  Lab Results  Component Value Date/Time   HMDIABEYEEXA No Retinopathy 07/22/2019 09:09 AM    Last diabetic Foot exam: No results found for: HMDIABFOOTEX   Checking BG: Daily   Patient has failed these meds in past: pioglitazone, Januvia Patient is currently uncontrolled on the following medications:   Metformin 500 mg BID,   Glimepiride 2 mg daily,   Farxiga 10 mg daily    We discussed: previously A1c very uncontrolled, pt had stopped metformin Oct 2020 due to recalls, pt did not start glimepiride for several weeks until conversation with cardiologist. With improved adherence A1c has improved to < 8%. Pursuing PAP for [place]. Today patient concerned about low blood sugar, discussed s/sx of hypoglycemia, rule of 15s for treatment of hypoglycemia.  Plan  Continue current medications and control with diet and exercise    Anxiety   Patient has failed these meds in past: n/a Patient is currently controlled on the following medications:   sertraline 50 mg daily  We discussed:  Pt is very happy with mood medication  Plan  Continue current medications    GERD/GI   Patient has failed these meds in past: n/a Patient is currently controlled on the following medications:   Pantoprazole 40 mg daily  famotidine 20 mg BID,   bisacodyl 5 mg TIW,   senna-docusate HS  We discussed:  Discussed optimal timing of famotidine 30 min before a meal. Pt has been on pantoprazole for ~ 1 week, noticed some benefit but still having heatburn. Discussed 2-4 weeks for optimal benefit, if still suffering significant heartburn after that may consider increasing to BID.  Plan  Continue current medications  Monitor heartburn for 2-4 weeks on  pantoprazole and may consider increasing to BID if still uncontrolled   Health Maintenance   Patient is currently controlled on the following medications:   Vitamin B12,   Biotin,   Vitamin D 2000 IU,   calcium-Vitamin D BID,   loratadine 10 mg daily  We discussed:  Patient is satisfied with current OTC regimen and denies issues. Has no issue with sinus infection since taking Vitamin D. She does report trouble sleeping, recommended melatonin 5-10 mg for help with sleep.  Plan  Continue current medications  Recommend melatonin 5-10 mg HS   Medication Management   Pt uses [facility] for all medications Use pill box Pt endorses 100% compliance  We discussed: Pt is satisfied with current med management, she reports compliance  with meds as prescribed. She is hesitant to start new meds in general and wary of side effects.   Plan  Continue current medication management strategy      Follow up: *** month phone visit   Charlene Brooke, PharmD, BCACP Clinical Pharmacist [facility] at [facility] [phone]

## 2020-04-13 DIAGNOSIS — Z23 Encounter for immunization: Secondary | ICD-10-CM | POA: Diagnosis not present

## 2020-04-22 ENCOUNTER — Telehealth: Payer: Self-pay | Admitting: Pharmacist

## 2020-04-22 NOTE — Progress Notes (Signed)
Chronic Care Management Pharmacy Assistant   Name: Kathryn Logan  MRN: [id number] DOB: 07/31/1945  Reason for Encounter: Diabetic Adherence Call   PCP : Binnie Rail, MD  Allergies:  No Known Allergies  Medications: Outpatient Encounter Medications as of 04/22/2020  Medication Sig  . aspirin EC 81 MG tablet Take 81 mg by mouth daily.   [name] BIOTIN PO Take 10,000 mcg by mouth daily.   . bisacodyl (DULCOLAX) 5 MG EC tablet Take 5 mg by mouth 3 (three) times a week.   . Calcium Carb-Cholecalciferol (CALCIUM + VITAMIN D3 PO) Take 600 mg by mouth. 600 mg- 25 mcg twice daily  . carvedilol (COREG) 3.125 MG tablet Take 1 tablet (3.125 mg total) by mouth 2 (two) times daily.  . Cholecalciferol (VITAMIN D3) 50 MCG (2000 UT) TABS Take by mouth.  . famotidine (PEPCID) 20 MG tablet Take 1 tablet by mouth twice daily  . FARXIGA 10 MG TABS tablet TAKE 1 TABLET BY MOUTH ONCE DAILY BEFORE  BREAKFAST  . glimepiride (AMARYL) 2 MG tablet TAKE 1 TABLET BY MOUTH ONCE DAILY BEFORE BREAKFAST  . glucose blood (ONE TOUCH ULTRA TEST) test strip Check blood sugar daily as directed, DX 250.02  . loratadine (CLARITIN) 10 MG tablet Take 1 tablet (10 mg total) by mouth daily.  . metFORMIN (GLUCOPHAGE) 500 MG tablet Take 1 tablet (500 mg total) by mouth 2 (two) times daily with a meal.  . ONETOUCH DELICA LANCETS FINE MISC 1 each by Does not apply route as directed. Check blood sugar daily as directed, DX 250.02 HOLD UNTIL PATIENT REQUEST  . pantoprazole (PROTONIX) 40 MG tablet Take 1 tablet (40 mg total) by mouth daily.  . rosuvastatin (CRESTOR) 40 MG tablet TAKE 1 TABLET BY MOUTH ONCE DAILY AT BEDTIME  . senna-docusate (SENOKOT-S) 8.6-50 MG tablet Take 1 tablet by mouth at bedtime.  . sertraline (ZOLOFT) 50 MG tablet Take 1 tablet by mouth once daily  . vitamin B-12 (CYANOCOBALAMIN) 1000 MCG tablet Take 1,000 mcg by mouth daily.   No facility-administered encounter medications on file as of 04/22/2020.     Current Diagnosis: Patient Active Problem List   Diagnosis Date Noted  . Anemia 11/26/2017  . Syncope 11/20/2017  . Compression fracture of L1 lumbar vertebra, closed, initial encounter ([place]) 11/20/2017  . Vitamin D deficiency 02/22/2016  . Anxiety 02/22/2016  . CAD (coronary artery disease) 02/21/2016  . GERD (gastroesophageal reflux disease) 04/14/2015  . S/P CABG x 3 12/01/2013  . NSTEMI (non-ST elevated myocardial infarction) ([place]) 11/26/2013  . Type 2 diabetes mellitus with vascular disease ([place]) 08/28/2012  . Hyperlipidemia 12/18/2006  . Osteopenia 10/09/2006    Goals Addressed   None     Follow-Up:  Pharmacist Review  Recent Relevant Labs: Lab Results  Component Value Date/Time   HGBA1C 9.2 (H) 03/11/2020 09:13 AM   HGBA1C 7.4 (A) 09/24/2019 08:36 AM   HGBA1C 11.5 (H) 07/23/2019 07:47 AM   MICROALBUR 3.5 (H) 07/23/2019 07:47 AM   MICROALBUR 2.9 (H) 04/13/2015 11:46 AM    Kidney Function Lab Results  Component Value Date/Time   CREATININE 1.14 03/11/2020 09:13 AM   CREATININE 0.99 07/23/2019 07:47 AM   GFR 47.98 (L) 03/11/2020 09:13 AM   GFRNONAA 37 (L) 11/23/2017 04:49 AM   GFRAA 43 (L) 11/23/2017 04:49 AM    . Current antihyperglycemic regimen: The patient regime is metformin, glimepiride and farxiga  What recent interventions/DTPs have been made to improve glycemic control: The patient  states that she should check blood sugar daily before eating and log her readings for appt,  . Have there been any recent hospitalizations or ED visits since last visit with CPP? The patient has not been to the hospital or the ED recently.  . Patient denies hypoglycemic symptoms  . Patient denies hyperglycemic symptoms  . How often are you checking your blood sugar? The patient states that she does not check her blood sugar but that she can usually tell if something is off by the smell of her urine.  . What are your blood sugars ranging? The patient had no reading  today o Fasting: NA o Before meals: NA o After meals: NA o Bedtime: NA o  . During the week, how often does your blood glucose drop below 70? The patient states that her blood sugar has not been below 70  . Are you checking your feet daily/regularly? The patient states that she has not blisters, cut or sores on feet  Adherence Review: Is the patient currently on a STATIN medication? Yes, Rosuvastatin Is the patient currently on ACE/ARB medication? No Does the patient have >5 day gap between last estimated fill dates? No   Wendy Poet, Clinical Pharmacist Assistant [facility]

## 2020-04-26 ENCOUNTER — Other Ambulatory Visit: Payer: Self-pay | Admitting: Internal Medicine

## 2020-05-12 ENCOUNTER — Other Ambulatory Visit: Payer: Self-pay | Admitting: Internal Medicine

## 2020-05-13 ENCOUNTER — Other Ambulatory Visit: Payer: Self-pay

## 2020-05-13 MED ORDER — CARVEDILOL 3.125 MG PO TABS: 3.1250 mg | ORAL_TABLET | Freq: Two times a day (BID) | ORAL | 1 refills | Status: DC

## 2020-05-13 NOTE — Telephone Encounter (Signed)
1.Medication Requested: metFORMIN (GLUCOPHAGE) 500 MG tablet    2. Pharmacy (Name, Street, [place]): [facility] - [place], [place] - [address]  3. On Med List: yes   4. Last Visit with PCP: 10.28.21  5. Next visit date with PCP: 4.28.22   Agent: Please be advised that RX refills may take up to 3 business days. We ask that you follow-up with your pharmacy.

## 2020-06-01 ENCOUNTER — Other Ambulatory Visit: Payer: Self-pay

## 2020-06-01 ENCOUNTER — Telehealth: Payer: Self-pay | Admitting: Internal Medicine

## 2020-06-01 MED ORDER — METFORMIN HCL 500 MG PO TABS: 500.0000 mg | ORAL_TABLET | Freq: Two times a day (BID) | ORAL | 1 refills | Status: DC

## 2020-06-01 MED ORDER — GLIMEPIRIDE 2 MG PO TABS: 2.0000 mg | ORAL_TABLET | Freq: Every day | ORAL | 1 refills | Status: DC

## 2020-06-01 NOTE — Telephone Encounter (Signed)
glimepiride (AMARYL) 2 MG tablet metFORMIN (GLUCOPHAGE) 500 MG tablet Patient needs these two sent to the new pharmacy for refills

## 2020-06-01 NOTE — Telephone Encounter (Signed)
Sent in today to new pharmacy on file.

## 2020-06-01 NOTE — Telephone Encounter (Signed)
FYI patient wanted Dr. Quay Burow nurse aware that she has switched pharmacies to [facility], [place], [place] [address]

## 2020-06-01 NOTE — Telephone Encounter (Signed)
This has already been updated.

## 2020-06-02 ENCOUNTER — Other Ambulatory Visit: Payer: Self-pay

## 2020-06-02 MED ORDER — METFORMIN HCL 500 MG PO TABS: 500.0000 mg | ORAL_TABLET | Freq: Two times a day (BID) | ORAL | 1 refills | Status: DC

## 2020-06-02 MED ORDER — SERTRALINE HCL 50 MG PO TABS: 50.0000 mg | ORAL_TABLET | Freq: Every day | ORAL | 1 refills | Status: DC

## 2020-06-02 NOTE — Telephone Encounter (Signed)
Refills sent in today. 

## 2020-06-02 NOTE — Telephone Encounter (Signed)
Patient states the pharmacy never received the metFORMIN (GLUCOPHAGE) 500 MG tablet but did receive the other medication. She is also wondering if she can get sertraline (ZOLOFT) 50 MG tablet refilled

## 2020-07-08 ENCOUNTER — Telehealth: Payer: Self-pay | Admitting: Pharmacist

## 2020-07-08 NOTE — Progress Notes (Signed)
Chronic Care Management Pharmacy Assistant   Name: Kathryn Logan  MRN: [id number] DOB: 1945-07-31  Reason for Encounter: Hypertension Adherence Call  PCP : Binnie Rail, MD  Allergies:  No Known Allergies  Medications: Outpatient Encounter Medications as of 07/08/2020  Medication Sig  . aspirin EC 81 MG tablet Take 81 mg by mouth daily.   [name] BIOTIN PO Take 10,000 mcg by mouth daily.   . bisacodyl (DULCOLAX) 5 MG EC tablet Take 5 mg by mouth 3 (three) times a week.   . Calcium Carb-Cholecalciferol (CALCIUM + VITAMIN D3 PO) Take 600 mg by mouth. 600 mg- 25 mcg twice daily  . carvedilol (COREG) 3.125 MG tablet Take 1 tablet (3.125 mg total) by mouth 2 (two) times daily.  . Cholecalciferol (VITAMIN D3) 50 MCG (2000 UT) TABS Take by mouth.  . famotidine (PEPCID) 20 MG tablet Take 1 tablet (20 mg total) by mouth 2 (two) times daily.  [name] FARXIGA 10 MG TABS tablet TAKE 1 TABLET BY MOUTH ONCE DAILY BEFORE  BREAKFAST  . glimepiride (AMARYL) 2 MG tablet Take 1 tablet (2 mg total) by mouth daily with breakfast.  . glucose blood (ONE TOUCH ULTRA TEST) test strip Check blood sugar daily as directed, DX 250.02  . loratadine (CLARITIN) 10 MG tablet Take 1 tablet (10 mg total) by mouth daily.  . metFORMIN (GLUCOPHAGE) 500 MG tablet Take 1 tablet (500 mg total) by mouth 2 (two) times daily with a meal.  . ONETOUCH DELICA LANCETS FINE MISC 1 each by Does not apply route as directed. Check blood sugar daily as directed, DX 250.02 HOLD UNTIL PATIENT REQUEST  . pantoprazole (PROTONIX) 40 MG tablet Take 1 tablet (40 mg total) by mouth daily.  . rosuvastatin (CRESTOR) 40 MG tablet TAKE 1 TABLET BY MOUTH ONCE DAILY AT BEDTIME  . senna-docusate (SENOKOT-S) 8.6-50 MG tablet Take 1 tablet by mouth at bedtime.  . sertraline (ZOLOFT) 50 MG tablet Take 1 tablet (50 mg total) by mouth daily.  . vitamin B-12 (CYANOCOBALAMIN) 1000 MCG tablet Take 1,000 mcg by mouth daily.   No facility-administered encounter  medications on file as of 07/08/2020.    Current Diagnosis: Patient Active Problem List   Diagnosis Date Noted  . Anemia 11/26/2017  . Syncope 11/20/2017  . Compression fracture of L1 lumbar vertebra, closed, initial encounter ([place]) 11/20/2017  . Vitamin D deficiency 02/22/2016  . Anxiety 02/22/2016  . CAD (coronary artery disease) 02/21/2016  . GERD (gastroesophageal reflux disease) 04/14/2015  . S/P CABG x 3 12/01/2013  . NSTEMI (non-ST elevated myocardial infarction) ([place]) 11/26/2013  . Type 2 diabetes mellitus with vascular disease ([place]) 08/28/2012  . Hyperlipidemia 12/18/2006  . Osteopenia 10/09/2006    Goals Addressed   None     Follow-Up:  Pharmacist Review   Reviewed chart prior to disease state call. Spoke with patient regarding BP  Recent Office Vitals: BP Readings from Last 3 Encounters:  03/11/20 108/72  09/24/19 110/70  09/24/19 110/70   Pulse Readings from Last 3 Encounters:  03/11/20 80  09/24/19 (!) 108  09/24/19 (!) 108    Wt Readings from Last 3 Encounters:  03/11/20 127 lb (57.6 kg)  09/24/19 119 lb (54 kg)  09/24/19 119 lb 3.2 oz (54.1 kg)     Kidney Function Lab Results  Component Value Date/Time   CREATININE 1.14 03/11/2020 09:13 AM   CREATININE 0.99 07/23/2019 07:47 AM   GFR 47.98 (L) 03/11/2020 09:13 AM   GFRNONAA 37 (  L) 11/23/2017 04:49 AM   GFRAA 43 (L) 11/23/2017 04:49 AM    BMP Latest Ref Rng & Units 03/11/2020 07/23/2019 01/01/2019  Glucose 70 - 99 mg/dL 231(H) 223(H) 170(H)  BUN 6 - 23 mg/dL 35(H) 23 19  Creatinine 0.40 - 1.20 mg/dL 1.14 0.99 0.95  Sodium 135 - 145 mEq/L 138 137 140  Potassium 3.5 - 5.1 mEq/L 4.7 4.5 4.4  Chloride 96 - 112 mEq/L 107 107 108  CO2 19 - 32 mEq/L 20 21 20   Calcium 8.4 - 10.5 mg/dL 10.0 9.5 10.4    . Current antihypertensive regimen: The patient is taking Coreg  . How often are you checking your Blood Pressure? The patient states that she does not check blood pressure at home only when she  goes to the doctor's office  . Current home BP readings: The patient does not have any home reading  . What recent interventions/DTPs have been made by any provider to improve Blood Pressure control since last CPP Visit: None  . Any recent hospitalizations or ED visits since last visit with CPP? The patient states that she has not been to the hospital or ED  . What diet changes have been made to improve Blood Pressure Control? There has been no changes     . What exercise is being done to improve your Blood Pressure Control? The patient states that she walks occasionally    Adherence Review: Is the patient currently on ACE/ARB medication? No Does the patient have >5 day gap between last estimated fill dates? No   Wendy Poet, [place]   Time spent:25

## 2020-07-09 ENCOUNTER — Telehealth: Payer: Self-pay | Admitting: Internal Medicine

## 2020-07-09 NOTE — Telephone Encounter (Signed)
Patient requesting refill for famotidine (PEPCID) 20 MG tablet Pharmacy [place], [address] [place]

## 2020-07-12 ENCOUNTER — Other Ambulatory Visit: Payer: Self-pay

## 2020-07-12 MED ORDER — FAMOTIDINE 20 MG PO TABS: 20.0000 mg | ORAL_TABLET | Freq: Two times a day (BID) | ORAL | 1 refills | Status: DC

## 2020-07-12 NOTE — Telephone Encounter (Signed)
Sent in today 

## 2020-07-28 ENCOUNTER — Other Ambulatory Visit: Payer: Self-pay

## 2020-07-28 ENCOUNTER — Ambulatory Visit (INDEPENDENT_AMBULATORY_CARE_PROVIDER_SITE_OTHER): Payer: Medicare Other | Admitting: Pharmacist

## 2020-07-28 DIAGNOSIS — I25119 Atherosclerotic heart disease of native coronary artery with unspecified angina pectoris: Secondary | ICD-10-CM | POA: Diagnosis not present

## 2020-07-28 DIAGNOSIS — E782 Mixed hyperlipidemia: Secondary | ICD-10-CM | POA: Diagnosis not present

## 2020-07-28 DIAGNOSIS — E1159 Type 2 diabetes mellitus with other circulatory complications: Secondary | ICD-10-CM | POA: Diagnosis not present

## 2020-07-28 NOTE — Progress Notes (Signed)
Chronic Care Management Pharmacy Note  07/28/2020 Name:  Kathryn Logan MRN:  [id number] DOB:  October 17, 1945  Subjective: Kathryn Logan is an 75 y.o. year old female who is a primary patient of Burns, Claudina Lick, MD.  The CCM team was consulted for assistance with disease management and care coordination needs.    Engaged with patient by telephone for follow up visit in response to provider referral for pharmacy case management and/or care coordination services.   Consent to Services:  The patient was given the following information about Chronic Care Management services today, agreed to services, and gave verbal consent: 1. CCM service includes personalized support from designated clinical staff supervised by the primary care provider, including individualized plan of care and coordination with other care providers 2. 24/7 contact phone numbers for assistance for urgent and routine care needs. 3. Service will only be billed when office clinical staff spend 20 minutes or more in a month to coordinate care. 4. Only one practitioner may furnish and bill the service in a calendar month. 5.The patient may stop CCM services at any time (effective at the end of the month) by phone call to the office staff. 6. The patient will be responsible for cost sharing (co-pay) of up to 20% of the service fee (after annual deductible is met). Patient agreed to services and consent obtained.  Patient Care Team: Binnie Rail, MD as PCP - General (Internal Medicine) Consuelo Pandy, PA-C as Physician Assistant (Cardiology) Debara Pickett Nadean Corwin, MD as Consulting Physician (Cardiology) Charlton Haws, [facility] (Pharmacist)  Recent office visits: 03/11/20 Dr Quay Burow OV: CPE. TSH elevated, will recheck 3 mos. GFR worse, increase fluids. A1c is 9.2%. Advised to increase metformin to 1000 mg BID - pt did not respond to [employer].  Recent consult visits: None  Hospital visits: None in previous 6  months  Objective:  Lab Results  Component Value Date   CREATININE 1.14 03/11/2020   BUN 35 (H) 03/11/2020   GFR 47.98 (L) 03/11/2020   GFRNONAA 37 (L) 11/23/2017   GFRAA 43 (L) 11/23/2017   NA 138 03/11/2020   K 4.7 03/11/2020   CALCIUM 10.0 03/11/2020   CO2 20 03/11/2020    Lab Results  Component Value Date/Time   HGBA1C 9.2 (H) 03/11/2020 09:13 AM   HGBA1C 7.4 (A) 09/24/2019 08:36 AM   HGBA1C 11.5 (H) 07/23/2019 07:47 AM   GFR 47.98 (L) 03/11/2020 09:13 AM   GFR 55.10 (L) 07/23/2019 07:47 AM   MICROALBUR 3.5 (H) 07/23/2019 07:47 AM   MICROALBUR 2.9 (H) 04/13/2015 11:46 AM    Last diabetic Eye exam:  Lab Results  Component Value Date/Time   HMDIABEYEEXA No Retinopathy 07/22/2019 09:09 AM    Last diabetic Foot exam: No results found for: HMDIABFOOTEX   Lab Results  Component Value Date   CHOL 116 03/11/2020   HDL 33.20 (L) 03/11/2020   LDLCALC 51 03/11/2020   LDLDIRECT 49.0 07/23/2019   TRIG 155.0 (H) 03/11/2020   CHOLHDL 3 03/11/2020    Hepatic Function Latest Ref Rng & Units 03/11/2020 07/23/2019 01/01/2019  Total Protein 6.0 - 8.3 g/dL 6.9 7.2 7.2  Albumin 3.5 - 5.2 g/dL 4.2 4.0 4.2  AST 0 - 37 U/L _0 ALT 0 - 35 U/L _1 Alk Phosphatase 39 - 117 U/L 77 79 76  Total Bilirubin 0.2 - 1.2 mg/dL 0.5 0.6 0.6  Bilirubin, Direct 0.0 - 0.3 mg/dL - - -  Lab Results  Component Value Date/Time   TSH 5.07 (H) 03/11/2020 09:13 AM   TSH 2.70 11/26/2017 08:17 AM   FREET4 1.09 11/27/2013 12:57 AM    CBC Latest Ref Rng & Units 03/11/2020 07/23/2019 01/01/2019  WBC 4.0 - 10.5 K/uL 10.1 9.4 8.9  Hemoglobin 12.0 - 15.0 g/dL 12.3 11.6(L) 12.0  Hematocrit 36.0 - 46.0 % 37.7 34.5(L) 35.2(L)  Platelets 150.0 - 400.0 K/uL 256.0 267.0 286.0    Lab Results  Component Value Date/Time   VD25OH 67.47 04/25/2018 09:59 AM   VD25OH 17.53 (L) 04/14/2015 03:04 PM    Clinical ASCVD: Yes  The ASCVD Risk score Mikey Bussing DC Jr., et al., 2013) failed to calculate for the  following reasons:   The patient has a prior MI or stroke diagnosis    Depression screen [facility] 2/9 09/24/2019 07/23/2019 04/25/2018  Decreased Interest 0 0 0  Down, Depressed, Hopeless 0 0 0  PHQ - 2 Score 0 0 0  Altered sleeping - - 2  Tired, decreased energy - - 1  Change in appetite - - 0  Feeling bad or failure about yourself  - - 0  Trouble concentrating - - 0  Moving slowly or fidgety/restless - - 0  Suicidal thoughts - - 0  PHQ-9 Score - - 3  Some recent data might be hidden    Social History   Tobacco Use  Smoking Status Never Smoker  Smokeless Tobacco Never Used   BP Readings from Last 3 Encounters:  03/11/20 108/72  09/24/19 110/70  09/24/19 110/70   Pulse Readings from Last 3 Encounters:  03/11/20 80  09/24/19 (!) 108  09/24/19 (!) 108   Wt Readings from Last 3 Encounters:  03/11/20 127 lb (57.6 kg)  09/24/19 119 lb (54 kg)  09/24/19 119 lb 3.2 oz (54.1 kg)   BMI Readings from Last 3 Encounters:  03/11/20 23.23 kg/m  09/24/19 21.77 kg/m  09/24/19 21.80 kg/m    Assessment/Interventions: Review of patient past medical history, allergies, medications, health status, including review of consultants reports, laboratory and other test data, was performed as part of comprehensive evaluation and provision of chronic care management services.   SDOH:  (Social Determinants of Health) assessments and interventions performed: Yes SDOH Interventions   Flowsheet Row Most Recent Value  SDOH Interventions   Financial Strain Interventions Other (Comment)  [Re-apply for [place]  No Known Allergies  Medications Reviewed Today    Reviewed by Binnie Rail, MD (Physician) on 03/11/20 at 1010  Med List Status: <None>  Medication Order Taking? Sig Documenting Provider Last Dose Status Informant  aspirin EC 81 MG tablet [id number] Yes Take 81 mg by mouth daily.  [provider] Taking Active Self  BIOTIN PO [id number] Yes Take 10,000 mcg  by mouth daily.  [provider] Taking Active Self  bisacodyl (DULCOLAX) 5 MG EC tablet [id number] Yes Take 5 mg by mouth 3 (three) times a week.  [provider] Taking Active Self  Calcium Carb-Cholecalciferol (CALCIUM + VITAMIN D3 PO) [id number] Yes Take 600 mg by mouth. 600 mg- 25 mcg twice daily [provider] Taking Active   carvedilol (COREG) 3.125 MG tablet [id number] Yes Take 1 tablet (3.125 mg total) by mouth 2 (two) times daily. Ledora Bottcher, PA Taking Active   Cholecalciferol (VITAMIN D3) 50 MCG (2000 UT) TABS [id number] Yes Take by mouth. [provider] Taking Active   famotidine (PEPCID) 20 MG  tablet [id number] Yes Take 1 tablet by mouth twice daily Binnie Rail, MD Taking Active   FARXIGA 10 MG TABS tablet [id number] Yes TAKE 1 TABLET BY MOUTH ONCE DAILY BEFORE  BREAKFAST Burns, Claudina Lick, MD Taking Active   glimepiride (AMARYL) 2 MG tablet [id number] Yes TAKE 1 TABLET BY MOUTH ONCE DAILY BEFORE BREAKFAST Burns, Claudina Lick, MD Taking Active   glucose blood (ONE TOUCH ULTRA TEST) test strip [id number] Yes Check blood sugar daily as directed, DX 250.02 Hendricks Limes, MD Taking Active Self  loratadine (CLARITIN) 10 MG tablet [id number] Yes Take 1 tablet (10 mg total) by mouth daily. Binnie Rail, MD Taking Active Self  metFORMIN (GLUCOPHAGE) 500 MG tablet [id number] Yes Take 1 tablet (500 mg total) by mouth 2 (two) times daily with a meal. Binnie Rail, MD Taking Active   ONETOUCH [place] [id number] Yes 1 each by Does not apply route as directed. Check blood sugar daily as directed, DX 250.02 HOLD UNTIL PATIENT REQUEST Hendricks Limes, MD Taking Active Self  pantoprazole (PROTONIX) 40 MG tablet [id number] Yes Take 1 tablet (40 mg total) by mouth daily. Binnie Rail, MD Taking Active   rosuvastatin (CRESTOR) 40 MG tablet [id number] Yes TAKE 1 TABLET BY MOUTH ONCE DAILY AT BEDTIME Hilty, Nadean Corwin, MD Taking Active   senna-docusate  (SENOKOT-S) 8.6-50 MG tablet [id number] Yes Take 1 tablet by mouth at bedtime. Patrecia Pour, MD Taking Active   sertraline (ZOLOFT) 50 MG tablet [id number] Yes Take 1 tablet by mouth once daily Burns, Claudina Lick, MD Taking Active   vitamin B-12 (CYANOCOBALAMIN) 1000 MCG tablet [id number] Yes Take 1,000 mcg by mouth daily. [provider] Taking Active Self          Patient Active Problem List   Diagnosis Date Noted  . Anemia 11/26/2017  . Syncope 11/20/2017  . Compression fracture of L1 lumbar vertebra, closed, initial encounter ([place]) 11/20/2017  . Vitamin D deficiency 02/22/2016  . Anxiety 02/22/2016  . CAD (coronary artery disease) 02/21/2016  . GERD (gastroesophageal reflux disease) 04/14/2015  . S/P CABG x 3 12/01/2013  . NSTEMI (non-ST elevated myocardial infarction) ([place]) 11/26/2013  . Type 2 diabetes mellitus with vascular disease ([place]) 08/28/2012  . Hyperlipidemia 12/18/2006  . Osteopenia 10/09/2006    Immunization History  Administered Date(s) Administered  . Fluad Quad(high Dose 65+) 01/22/2019, 03/11/2020  . Influenza, High Dose Seasonal PF 02/22/2016, 03/14/2017, 04/25/2018  . Influenza,inj,Quad PF,6+ Mos 03/03/2014, 04/14/2015  . Influenza-Unspecified 02/16/2017  . PFIZER(Purple Top)SARS-COV-2 Vaccination 06/19/2019, 07/16/2019  . Pneumococcal Conjugate-13 04/13/2017  . Pneumococcal Polysaccharide-23 08/29/2012  . Tdap 08/28/2012    Conditions to be addressed/monitored:  Hypertension, Hyperlipidemia, Diabetes and Coronary Artery Disease  Care Plan : [place]  Updates made by Charlton Haws, [place] since 07/28/2020 12:00 AM    Problem: Hypertension, Hyperlipidemia, Diabetes and Coronary Artery Disease   Priority: High    Long-Range Goal: Disease management   Start Date: 07/28/2020  Expected End Date: 01/28/2021  This Visit's Progress: On track  Priority: High  Note:   Current Barriers:  . Unable to independently afford treatment  regimen . Unable to independently monitor therapeutic efficacy . Unable to maintain control of diabetes  Pharmacist Clinical Goal(s):  [name] Patient will verbalize ability to afford treatment regimen . achieve adherence to monitoring guidelines and medication adherence to achieve therapeutic efficacy . maintain control of diabetes as evidenced by improved A1c  through collaboration with PharmD  and provider.    Interventions: . 1:1 collaboration with Binnie Rail, MD regarding development and update of comprehensive plan of care as evidenced by provider attestation and co-signature . Inter-disciplinary care team collaboration (see longitudinal plan of care) . Comprehensive medication review performed; medication list updated in electronic medical record  Hypertension    BP goal < 130/80 Pt does not monitor BP at home. Patient has failed these meds in the past: lisinopril, metoprolol Patient is currently controlled on the following medications:   carvedilol 3.125 mg BID  We discussed diet and exercise extensively   Plan: Continue current medications and control with diet and exercise   Hyperlipidemia/CAD    LDL goal < 70  The patient has a prior MI or stroke diagnosis; hx CABG x 3   Patient has failed these meds in past: atorvastatin Patient is currently controlled on the following medications:   rosuvastatin 40 mg HS,   aspirin 81 mg   We discussed:  Cholesterol goals, benefits of statin. Also discussed benefits of fish oil for triglycerides, however pt states she does not want to add any more pills.   Plan: Continue current medications and control with diet and exercise   Diabetes    A1c goal < 7% Home BG readings: not checking right now   Patient has failed these meds in past: pioglitazone, Januvia Patient is currently uncontrolled on the following medications:   Metformin 500 mg BID,   Glimepiride 2 mg daily,   Farxiga 10 mg daily (via PAP)  We discussed: pt  reports she has discussed increasing metformin to 1000 mg BID with PCP and it was decided to wait until repeat labs next month. Pt will be running out of Farxiga in a week and needs to re-apply for patient assistance. Also discussed benefit of checking BG at home to better correlate A1c.   Plan: Continue current medications and control with diet and exercise  -Re-apply for [place] patient assistance -Pt will be provided with Farxiga samples x 3 week supply -advised to check BG 3 times a week, varying time of day and bring log to PCP appt in April  Patient Goals/Self-Care Activities . Patient will:  - take medications as prescribed focus on medication adherence by pill box check glucose 3 times a week, varying time of day, document, and provide at future appointments collaborate with provider on medication access solutions  Follow Up Plan: Telephone follow up appointment with care management team member scheduled for: 6 months      Medication Assistance: Wilder Glade obtained through [employer] medication assistance program.  Enrollment ends 05/14/21  Patient's preferred pharmacy is:  [facility] ST[facility] [place], [address] [place] [address] Phone: [phone] Fax: [phone]  Uses pill box? Yes Pt endorses 100% compliance  We discussed: Current pharmacy is preferred with insurance plan and patient is satisfied with pharmacy services Patient decided to: Continue current medication management strategy  Care Plan and Follow Up Patient Decision:  Patient agrees to Care Plan and Follow-up.  Plan: Telephone follow up appointment with care management team member scheduled for:  6 months  Charlene Brooke, PharmD, [facility] Clinical Pharmacist [place] [facility] at [facility] [phone]

## 2020-07-28 NOTE — Patient Instructions (Signed)
Visit Information  Phone number for Pharmacist: [phone]  Goals Addressed   None    Patient Care Plan: CCM Pharmacy Care Plan    Problem Identified: Hypertension, Hyperlipidemia, Diabetes and Coronary Artery Disease   Priority: High    Long-Range Goal: Disease management   Start Date: 07/28/2020  Expected End Date: 01/28/2021  This Visit's Progress: On track  Priority: High  Note:   Current Barriers:  . Unable to independently afford treatment regimen . Unable to independently monitor therapeutic efficacy . Unable to maintain control of diabetes  Pharmacist Clinical Goal(s):  [name] Patient will verbalize ability to afford treatment regimen . achieve adherence to monitoring guidelines and medication adherence to achieve therapeutic efficacy . maintain control of diabetes as evidenced by improved A1c  through collaboration with PharmD and provider.    Interventions: . 1:1 collaboration with Binnie Rail, MD regarding development and update of comprehensive plan of care as evidenced by provider attestation and co-signature . Inter-disciplinary care team collaboration (see longitudinal plan of care) . Comprehensive medication review performed; medication list updated in electronic medical record  Hypertension    BP goal < 130/80 Pt does not monitor BP at home. Patient has failed these meds in the past: lisinopril, metoprolol Patient is currently controlled on the following medications:   carvedilol 3.125 mg BID  We discussed diet and exercise extensively   Plan: Continue current medications and control with diet and exercise   Hyperlipidemia/CAD    LDL goal < 70  The patient has a prior MI or stroke diagnosis; hx CABG x 3   Patient has failed these meds in past: atorvastatin Patient is currently controlled on the following medications:   rosuvastatin 40 mg HS,   aspirin 81 mg   We discussed:  Cholesterol goals, benefits of statin. Also discussed benefits of fish  oil for triglycerides, however pt states she does not want to add any more pills.   Plan: Continue current medications and control with diet and exercise   Diabetes    A1c goal < 7% Home BG readings: not checking right now   Patient has failed these meds in past: pioglitazone, Januvia Patient is currently uncontrolled on the following medications:   Metformin 500 mg BID,   Glimepiride 2 mg daily,   Farxiga 10 mg daily (via PAP)  We discussed: pt reports she has discussed increasing metformin to 1000 mg BID with PCP and it was decided to wait until repeat labs next month. Pt will be running out of Farxiga in a week and needs to re-apply for patient assistance. Also discussed benefit of checking BG at home to better correlate A1c.   Plan: Continue current medications and control with diet and exercise  -Re-apply for [place] patient assistance -Pt will be provided with Farxiga samples x 3 week supply -advised to check BG 3 times a week, varying time of day and bring log to PCP appt in April  Patient Goals/Self-Care Activities . Patient will:  - take medications as prescribed focus on medication adherence by pill box check glucose 3 times a week, varying time of day, document, and provide at future appointments collaborate with provider on medication access solutions  Follow Up Plan: Telephone follow up appointment with care management team member scheduled for: 6 months      Patient verbalizes understanding of instructions provided today and agrees to view in [place].  Telephone follow up appointment with pharmacy team member scheduled for: 6 months  Charlene Brooke, PharmD, [facility]  Clinical Pharmacist [place] [facility] at [facility] [phone]

## 2020-08-02 ENCOUNTER — Telehealth: Payer: Self-pay | Admitting: Internal Medicine

## 2020-08-02 NOTE — Progress Notes (Signed)
Made a call to the patient to let her know what time Mendel Ryder will be at the office to help with paper work but patient's vm is not set up so could not leave a message. Also called the home number but could not leave message vm full.   Wendy Poet, [place]

## 2020-08-02 NOTE — Telephone Encounter (Signed)
Patient calling, states she needs to come by and get some help filling out some paper work so she wanted to reach out to you before she came [phone]

## 2020-08-24 ENCOUNTER — Telehealth: Payer: Self-pay

## 2020-08-24 NOTE — Progress Notes (Signed)
Called and spoke with [employer] about patient assistance application for [place]. Patient was approved on 08/19/20 and is approved until 05/14/21. Patient will need to call 04/14/21 if still taking this medication to recertify. Patient should receive medication in 7-10 days.

## 2020-08-26 ENCOUNTER — Other Ambulatory Visit: Payer: Self-pay

## 2020-08-26 ENCOUNTER — Telehealth: Payer: Self-pay | Admitting: Internal Medicine

## 2020-08-26 ENCOUNTER — Telehealth: Payer: Self-pay

## 2020-08-26 MED ORDER — DAPAGLIFLOZIN PROPANEDIOL 10 MG PO TABS: 10.0000 mg | ORAL_TABLET | Freq: Every day | ORAL | 0 refills | Status: DC

## 2020-08-26 NOTE — Telephone Encounter (Signed)
Faxed in today. 

## 2020-08-26 NOTE — Telephone Encounter (Signed)
  FARXIGA 10 MG TABS tablet [facility] STORE [place], [place] AT [place] Phone:  [phone]  Fax:  [phone]     Last seen- 10.28.21 Next apt- 04.28.22

## 2020-08-26 NOTE — Telephone Encounter (Addendum)
Patient is approved for [place] patient assistance but the shipment is delayed by 2 weeks. She reports she will run out [place] in 3 days and is requesting samples to bridge the gap.  Patient will require Farxiga 10 mg x 14 tablets. Pt would like to pick up from front office on Monday 4/18 if possible.  Forwarding to Licensed conveyancer to coordinate samples.

## 2020-08-26 NOTE — Progress Notes (Signed)
error 

## 2020-08-30 NOTE — Telephone Encounter (Signed)
Pt given Farxiga 10mg  14 tablets as ordered by [facility].

## 2020-09-09 ENCOUNTER — Ambulatory Visit: Payer: Medicare Other | Admitting: Internal Medicine

## 2020-09-09 ENCOUNTER — Other Ambulatory Visit: Payer: Medicare Other

## 2020-09-09 NOTE — Progress Notes (Signed)
    Chronic Care Management Pharmacy Assistant   Name: Kathryn Logan  MRN: [id number] DOB: 04/04/1946    Reason for Encounter: Chart Review    Medications: Outpatient Encounter Medications as of 08/26/2020  Medication Sig  . aspirin EC 81 MG tablet Take 81 mg by mouth daily.   [name] BIOTIN PO Take 10,000 mcg by mouth daily.   . bisacodyl (DULCOLAX) 5 MG EC tablet Take 5 mg by mouth 3 (three) times a week.   . Calcium Carb-Cholecalciferol (CALCIUM + VITAMIN D3 PO) Take 600 mg by mouth. 600 mg- 25 mcg twice daily  . carvedilol (COREG) 3.125 MG tablet Take 1 tablet (3.125 mg total) by mouth 2 (two) times daily.  . Cholecalciferol (VITAMIN D3) 50 MCG (2000 UT) TABS Take by mouth.  . dapagliflozin propanediol (FARXIGA) 10 MG TABS tablet Take 1 tablet (10 mg total) by mouth daily before breakfast.  . famotidine (PEPCID) 20 MG tablet Take 1 tablet (20 mg total) by mouth 2 (two) times daily.  [name] glimepiride (AMARYL) 2 MG tablet Take 1 tablet (2 mg total) by mouth daily with breakfast.  . glucose blood (ONE TOUCH ULTRA TEST) test strip Check blood sugar daily as directed, DX 250.02  . loratadine (CLARITIN) 10 MG tablet Take 1 tablet (10 mg total) by mouth daily.  . metFORMIN (GLUCOPHAGE) 500 MG tablet Take 1 tablet (500 mg total) by mouth 2 (two) times daily with a meal.  . ONETOUCH DELICA LANCETS FINE MISC 1 each by Does not apply route as directed. Check blood sugar daily as directed, DX 250.02 HOLD UNTIL PATIENT REQUEST  . pantoprazole (PROTONIX) 40 MG tablet Take 1 tablet (40 mg total) by mouth daily.  . rosuvastatin (CRESTOR) 40 MG tablet TAKE 1 TABLET BY MOUTH ONCE DAILY AT BEDTIME  . senna-docusate (SENOKOT-S) 8.6-50 MG tablet Take 1 tablet by mouth at bedtime.  . sertraline (ZOLOFT) 50 MG tablet Take 1 tablet (50 mg total) by mouth daily.  . vitamin B-12 (CYANOCOBALAMIN) 1000 MCG tablet Take 1,000 mcg by mouth daily.   No facility-administered encounter medications on file as of  08/26/2020.    Pharmacist Review  Reviewed chart for medication changes and adherence.   No gaps in adherence identified. Patient has follow up scheduled with pharmacy team. No further action required.  [place] Pharmacist Assistant [phone]  Time spent:5

## 2020-09-15 ENCOUNTER — Telehealth: Payer: Self-pay | Admitting: Pharmacist

## 2020-09-15 NOTE — Progress Notes (Addendum)
Chronic Care Management Pharmacy Assistant   Name: Kathryn Logan  MRN: [id number] DOB: 1946/04/28    Reason for Encounter: Diabetic Disease State Call   Conditions to be addressed/monitored: DMII   Recent office visits:  None ID  Recent consult visits:  None ID  Hospital visits:  None in previous 6 months  Medications: Outpatient Encounter Medications as of 09/15/2020  Medication Sig   aspirin EC 81 MG tablet Take 81 mg by mouth daily.    BIOTIN PO Take 10,000 mcg by mouth daily.    bisacodyl (DULCOLAX) 5 MG EC tablet Take 5 mg by mouth 3 (three) times a week.    Calcium Carb-Cholecalciferol (CALCIUM + VITAMIN D3 PO) Take 600 mg by mouth. 600 mg- 25 mcg twice daily   carvedilol (COREG) 3.125 MG tablet Take 1 tablet (3.125 mg total) by mouth 2 (two) times daily.   Cholecalciferol (VITAMIN D3) 50 MCG (2000 UT) TABS Take by mouth.   dapagliflozin propanediol (FARXIGA) 10 MG TABS tablet Take 1 tablet (10 mg total) by mouth daily before breakfast.   famotidine (PEPCID) 20 MG tablet Take 1 tablet (20 mg total) by mouth 2 (two) times daily.   glimepiride (AMARYL) 2 MG tablet Take 1 tablet (2 mg total) by mouth daily with breakfast.   glucose blood (ONE TOUCH ULTRA TEST) test strip Check blood sugar daily as directed, DX 250.02   loratadine (CLARITIN) 10 MG tablet Take 1 tablet (10 mg total) by mouth daily.   metFORMIN (GLUCOPHAGE) 500 MG tablet Take 1 tablet (500 mg total) by mouth 2 (two) times daily with a meal.   ONETOUCH DELICA LANCETS FINE MISC 1 each by Does not apply route as directed. Check blood sugar daily as directed, DX 250.02 HOLD UNTIL PATIENT REQUEST   pantoprazole (PROTONIX) 40 MG tablet Take 1 tablet (40 mg total) by mouth daily.   rosuvastatin (CRESTOR) 40 MG tablet TAKE 1 TABLET BY MOUTH ONCE DAILY AT BEDTIME   senna-docusate (SENOKOT-S) 8.6-50 MG tablet Take 1 tablet by mouth at bedtime.   sertraline (ZOLOFT) 50 MG tablet Take 1 tablet (50 mg total) by  mouth daily.   vitamin B-12 (CYANOCOBALAMIN) 1000 MCG tablet Take 1,000 mcg by mouth daily.   No facility-administered encounter medications on file as of 09/15/2020.   Recent Relevant Labs: Lab Results  Component Value Date/Time   HGBA1C 9.2 (H) 03/11/2020 09:13 AM   HGBA1C 7.4 (A) 09/24/2019 08:36 AM   HGBA1C 11.5 (H) 07/23/2019 07:47 AM   MICROALBUR 3.5 (H) 07/23/2019 07:47 AM   MICROALBUR 2.9 (H) 04/13/2015 11:46 AM    Kidney Function Lab Results  Component Value Date/Time   CREATININE 1.14 03/11/2020 09:13 AM   CREATININE 0.99 07/23/2019 07:47 AM   GFR 47.98 (L) 03/11/2020 09:13 AM   GFRNONAA 37 (L) 11/23/2017 04:49 AM   GFRAA 43 (L) 11/23/2017 04:49 AM    Current antihyperglycemic regimen:  Metformin 500 mg 1 tab twice daily, Farxiga 10 mg 1 tab daily and glimepiride 2 mg 1 tab daily  What recent interventions/DTPs have been made to improve glycemic control: Check blood sugar varying time of day  Have there been any recent hospitalizations or ED visits since last visit with CPP? None ID  Patient denies any low readings and  hypoglycemic symptoms  Patient denies any high reading and hyperglycemic symptoms  How often are you checking your blood sugar? Patient states that she checks blood sugar once a week  What are your blood  sugars ranging? Patient cannot recall any right now, but states they are consistent with A1c range 7-9  During the week, how often does your blood glucose drop below 70? Patient states no readings below 70  Are you checking your feet daily/regularly? Patient states no issue with feet  Adherence Review: Is the patient currently on a STATIN medication? Rosuvastatin Is the patient currently on ACE/ARB medication? No Does the patient have >5 day gap between last estimated fill dates? Yes  Star Rating Drugs: Rosuvastatin 08/15/20 90 ds Metformin 06/02/20 90 ds  Ethelene Hal Clinical Pharmacist Assistant [phone]  Time spent:40

## 2020-09-23 ENCOUNTER — Other Ambulatory Visit: Payer: Self-pay

## 2020-09-23 ENCOUNTER — Ambulatory Visit: Payer: Medicare Other

## 2020-09-23 ENCOUNTER — Telehealth: Payer: Self-pay

## 2020-09-23 NOTE — Telephone Encounter (Signed)
Attempted to contact patient to do AWV which was scheduled for 8:15 am.  Patient did not answer the phone.  Unable to leave voicemail as it was not set up.  Appointment needs to be rescheduled.

## 2020-09-28 ENCOUNTER — Ambulatory Visit: Payer: Medicare Other | Admitting: Internal Medicine

## 2020-09-28 ENCOUNTER — Other Ambulatory Visit: Payer: Medicare Other

## 2020-09-30 ENCOUNTER — Other Ambulatory Visit: Payer: Self-pay | Admitting: Internal Medicine

## 2020-10-27 ENCOUNTER — Other Ambulatory Visit: Payer: Medicare Other

## 2020-10-27 ENCOUNTER — Ambulatory Visit: Payer: Medicare Other | Admitting: Internal Medicine

## 2020-10-28 ENCOUNTER — Other Ambulatory Visit: Payer: Self-pay | Admitting: Internal Medicine

## 2020-11-09 DIAGNOSIS — E038 Other specified hypothyroidism: Secondary | ICD-10-CM | POA: Insufficient documentation

## 2020-11-09 DIAGNOSIS — I700 Atherosclerosis of aorta: Secondary | ICD-10-CM | POA: Insufficient documentation

## 2020-11-09 NOTE — Patient Instructions (Addendum)
  Blood work was ordered.       Medications changes include :   none   Schedule your mammogram and bone density at [facility].    Please followup in 3 months

## 2020-11-09 NOTE — Progress Notes (Signed)
Subjective:    Patient ID: Kathryn Logan, female    DOB: 1945/11/25, 75 y.o.   MRN: [id number]  HPI The patient is here for follow up of their chronic medical problems, including DM, CAD, hld, gerd, anxiety, osteopenia  She is walking some.  She is eating pretty good.  She states she is taking all her medications as prescribed.  Medications and allergies reviewed with patient and updated if appropriate.  Patient Active Problem List   Diagnosis Date Noted   Aortic atherosclerosis ([place]) 11/09/2020   Subclinical hypothyroidism 11/09/2020   Anemia 11/26/2017   Syncope 11/20/2017   Compression fracture of L1 lumbar vertebra, closed, initial encounter ([place]) 11/20/2017   Vitamin D deficiency 02/22/2016   Anxiety 02/22/2016   CAD (coronary artery disease) 02/21/2016   GERD (gastroesophageal reflux disease) 04/14/2015   S/P CABG x 3 12/01/2013   NSTEMI (non-ST elevated myocardial infarction) ([place]) 11/26/2013   Type 2 diabetes mellitus with vascular disease ([place]) 08/28/2012   Hyperlipidemia 12/18/2006   Osteopenia 10/09/2006    Current Outpatient Medications on File Prior to Visit  Medication Sig Dispense Refill   aspirin EC 81 MG tablet Take 81 mg by mouth daily.      BIOTIN PO Take 10,000 mcg by mouth daily.      bisacodyl (DULCOLAX) 5 MG EC tablet Take 5 mg by mouth 3 (three) times a week.     Calcium Carb-Cholecalciferol (CALCIUM + VITAMIN D3 PO) Take 600 mg by mouth. 600 mg- 25 mcg twice daily     carvedilol (COREG) 3.125 MG tablet Take 1 tablet (3.125 mg total) by mouth 2 (two) times daily. 180 tablet 1   Cholecalciferol (VITAMIN D3) 50 MCG (2000 UT) TABS Take by mouth.     dapagliflozin propanediol (FARXIGA) 10 MG TABS tablet Take 1 tablet (10 mg total) by mouth daily before breakfast. 90 tablet 0   famotidine (PEPCID) 20 MG tablet Take 1 tablet (20 mg total) by mouth 2 (two) times daily. 180 tablet 1   glimepiride (AMARYL) 2 MG tablet TAKE 1 TABLET(2 MG) BY MOUTH DAILY  WITH BREAKFAST 90 tablet 0   glucose blood (ONE TOUCH ULTRA TEST) test strip Check blood sugar daily as directed, DX 250.02 100 each 12   loratadine (CLARITIN) 10 MG tablet Take 1 tablet (10 mg total) by mouth daily. 90 tablet 3   metFORMIN (GLUCOPHAGE) 500 MG tablet TAKE 1 TABLET(500 MG) BY MOUTH TWICE DAILY WITH A MEAL 180 tablet 0   ONETOUCH DELICA LANCETS FINE MISC 1 each by Does not apply route as directed. Check blood sugar daily as directed, DX 250.02 HOLD UNTIL PATIENT REQUEST 100 each 5   pantoprazole (PROTONIX) 40 MG tablet Take 1 tablet (40 mg total) by mouth daily. 90 tablet 1   rosuvastatin (CRESTOR) 40 MG tablet TAKE 1 TABLET BY MOUTH EVERY NIGHT AT BEDTIME 30 tablet 0   senna-docusate (SENOKOT-S) 8.6-50 MG tablet Take 1 tablet by mouth at bedtime. 30 tablet 0   sertraline (ZOLOFT) 50 MG tablet Take 1 tablet (50 mg total) by mouth daily. 90 tablet 1   vitamin B-12 (CYANOCOBALAMIN) 1000 MCG tablet Take 1,000 mcg by mouth daily.     No current facility-administered medications on file prior to visit.    Past Medical History:  Diagnosis Date   Compression fracture of L1 lumbar vertebra ([place]) 11/21/2017   Diabetes mellitus without complication (HCC)    Gallbladder sludge    GERD (gastroesophageal reflux disease)  Heart disease    Hyperlipidemia    NSTEMI (non-ST elevated myocardial infarction) ([place]) 11/2013   Osteopenia    Syncope 11/2017    Past Surgical History:  Procedure Laterality Date   CORONARY ARTERY BYPASS GRAFT N/A 12/01/2013   Procedure: CORONARY ARTERY BYPASS GRAFTING (CABG) x three, using left internal mammary artery, and right leg saphenous vein harvested endoscopically (LIMA to LAD, SVG to OM2 , SVG to dRCA);  Surgeon: Grace Isaac, MD;  Location: [place];  Service: Open Heart Surgery;  Laterality: N/A;   G 1 P 1     INTRAOPERATIVE TRANSESOPHAGEAL ECHOCARDIOGRAM N/A 12/01/2013   Procedure: INTRAOPERATIVE TRANSESOPHAGEAL ECHOCARDIOGRAM;  Surgeon: Grace Isaac, MD;  Location: [place];  Service: Open Heart Surgery;  Laterality: N/A;   LEFT HEART CATHETERIZATION WITH CORONARY ANGIOGRAM N/A 11/27/2013   Procedure: LEFT HEART CATHETERIZATION WITH CORONARY ANGIOGRAM;  Surgeon: Sinclair Grooms, MD;  Location: [facility] CATH LAB;  Service: Cardiovascular;  Laterality: N/A;   no colonoscopy     SOC reviewed   TONSILLECTOMY AND ADENOIDECTOMY      Social History   Socioeconomic History   Marital status: Married    Spouse name: Not on file   Number of children: Not on file   Years of education: Not on file   Highest education level: Not on file  Occupational History   Not on file  Tobacco Use   Smoking status: Never   Smokeless tobacco: Never  Vaping Use   Vaping Use: Never used  Substance and Sexual Activity   Alcohol use: Yes    Comment: Occasionally    Drug use: No   Sexual activity: Not on file  Other Topics Concern   Not on file  Social History Narrative   Not on file   Social Determinants of Health   Financial Resource Strain: Medium Risk   Difficulty of Paying Living Expenses: Somewhat hard  Food Insecurity: Not on file  Transportation Needs: Not on file  Physical Activity: Not on file  Stress: Not on file  Social Connections: Not on file    Family History  Problem Relation Age of Onset   Heart attack Mother 24   Diabetes Mother    Stroke Mother    Hypertension Mother    Goiter Mother    Heart attack Father        >55   Bladder Cancer Father    Parkinson's disease Maternal Aunt    Osteoporosis Sister    Diabetes Brother     Review of Systems  Constitutional:  Negative for fever.  Respiratory:  Negative for cough, shortness of breath and wheezing.   Cardiovascular:  Negative for chest pain, palpitations and leg swelling.  Neurological:  Negative for light-headedness and headaches.      Objective:   Vitals:   11/10/20 0945  BP: 110/70  Pulse: 99  Temp: 97.9 F (36.6 C)  SpO2: 99%   BP Readings from  Last 3 Encounters:  11/10/20 110/70  03/11/20 108/72  09/24/19 110/70   Wt Readings from Last 3 Encounters:  11/10/20 124 lb (56.2 kg)  03/11/20 127 lb (57.6 kg)  09/24/19 119 lb (54 kg)   Body mass index is 22.68 kg/m.   Physical Exam    Constitutional: Appears well-developed and well-nourished. No distress.  HENT:  Head: Normocephalic and atraumatic.  Neck: Neck supple. No tracheal deviation present. No thyromegaly present.  No cervical lymphadenopathy Cardiovascular: Normal rate, regular rhythm and normal heart sounds.  No murmur heard. No carotid bruit .  No edema Pulmonary/Chest: Effort normal and breath sounds normal. No respiratory distress. No has no wheezes. No rales.  Skin: Skin is warm and dry. Not diaphoretic.  Psychiatric: Normal mood and affect. Behavior is normal.      Assessment & Plan:    See Problem List for Assessment and Plan of chronic medical problems.    This visit occurred during the SARS-CoV-2 public health emergency.  Safety protocols were in place, including screening questions prior to the visit, additional usage of staff PPE, and extensive cleaning of exam room while observing appropriate contact time as indicated for disinfecting solutions.

## 2020-11-10 ENCOUNTER — Encounter: Payer: Self-pay | Admitting: Internal Medicine

## 2020-11-10 ENCOUNTER — Ambulatory Visit (INDEPENDENT_AMBULATORY_CARE_PROVIDER_SITE_OTHER): Payer: Medicare Other | Admitting: Internal Medicine

## 2020-11-10 ENCOUNTER — Other Ambulatory Visit (INDEPENDENT_AMBULATORY_CARE_PROVIDER_SITE_OTHER): Payer: Medicare Other

## 2020-11-10 ENCOUNTER — Other Ambulatory Visit: Payer: Self-pay

## 2020-11-10 VITALS — BP 110/70 | HR 99 | Temp 97.9°F | Ht 62.0 in | Wt 124.0 lb

## 2020-11-10 DIAGNOSIS — I25119 Atherosclerotic heart disease of native coronary artery with unspecified angina pectoris: Secondary | ICD-10-CM

## 2020-11-10 DIAGNOSIS — M85869 Other specified disorders of bone density and structure, unspecified lower leg: Secondary | ICD-10-CM | POA: Diagnosis not present

## 2020-11-10 DIAGNOSIS — E782 Mixed hyperlipidemia: Secondary | ICD-10-CM

## 2020-11-10 DIAGNOSIS — N183 Chronic kidney disease, stage 3 unspecified: Secondary | ICD-10-CM | POA: Insufficient documentation

## 2020-11-10 DIAGNOSIS — F419 Anxiety disorder, unspecified: Secondary | ICD-10-CM | POA: Diagnosis not present

## 2020-11-10 DIAGNOSIS — N1832 Chronic kidney disease, stage 3b: Secondary | ICD-10-CM | POA: Insufficient documentation

## 2020-11-10 DIAGNOSIS — E038 Other specified hypothyroidism: Secondary | ICD-10-CM | POA: Diagnosis not present

## 2020-11-10 DIAGNOSIS — E2839 Other primary ovarian failure: Secondary | ICD-10-CM | POA: Diagnosis not present

## 2020-11-10 DIAGNOSIS — N1831 Chronic kidney disease, stage 3a: Secondary | ICD-10-CM | POA: Diagnosis not present

## 2020-11-10 DIAGNOSIS — E1159 Type 2 diabetes mellitus with other circulatory complications: Secondary | ICD-10-CM

## 2020-11-10 DIAGNOSIS — K219 Gastro-esophageal reflux disease without esophagitis: Secondary | ICD-10-CM | POA: Diagnosis not present

## 2020-11-10 DIAGNOSIS — I700 Atherosclerosis of aorta: Secondary | ICD-10-CM

## 2020-11-10 LAB — LIPID PANEL
Cholesterol: 108 mg/dL (ref 0–200)
HDL: 27.4 mg/dL — ABNORMAL LOW (ref >39.00)
NonHDL: 80.94
Total CHOL/HDL Ratio: 4
Triglycerides: 223 mg/dL — ABNORMAL HIGH (ref 0.0–149.0)
VLDL: 44.6 mg/dL — ABNORMAL HIGH (ref 0.0–40.0)

## 2020-11-10 LAB — COMPREHENSIVE METABOLIC PANEL
ALT: 9 U/L (ref 0–35)
AST: 12 U/L (ref 0–37)
Albumin: 4.2 g/dL (ref 3.5–5.2)
Alkaline Phosphatase: 76 U/L (ref 39–117)
BUN: 27 mg/dL — ABNORMAL HIGH (ref 6–23)
CO2: 20 mEq/L (ref 19–32)
Calcium: 9.6 mg/dL (ref 8.4–10.5)
Chloride: 108 mEq/L (ref 96–112)
Creatinine, Ser: 1.12 mg/dL (ref 0.40–1.20)
GFR: 48.78 mL/min — ABNORMAL LOW (ref >60.00)
Glucose, Bld: 213 mg/dL — ABNORMAL HIGH (ref 70–99)
Potassium: 4.3 mEq/L (ref 3.5–5.1)
Sodium: 139 mEq/L (ref 135–145)
Total Bilirubin: 0.5 mg/dL (ref 0.2–1.2)
Total Protein: 7 g/dL (ref 6.0–8.3)

## 2020-11-10 LAB — HEMOGLOBIN A1C: Hgb A1c MFr Bld: 9.8 % — ABNORMAL HIGH (ref 4.6–6.5)

## 2020-11-10 LAB — LDL CHOLESTEROL, DIRECT: Direct LDL: 53 mg/dL

## 2020-11-10 LAB — TSH: TSH: 3.67 u[IU]/mL (ref 0.35–4.50)

## 2020-11-10 NOTE — Assessment & Plan Note (Signed)
Chronic Controlled, stable Continue sertraline 50 mg

## 2020-11-10 NOTE — Assessment & Plan Note (Signed)
Chronic DEXA due-ordered Encouraged regular exercise Taking calcium and vitamin D daily

## 2020-11-10 NOTE — Assessment & Plan Note (Signed)
Chronic  Clinically euthyroid Check tsh    

## 2020-11-10 NOTE — Assessment & Plan Note (Signed)
Chronic Check lipid panel  Continue crestor 40 mg qd Regular exercise and healthy diet encouraged  

## 2020-11-10 NOTE — Assessment & Plan Note (Addendum)
Chronic Lab Results  Component Value Date   HGBA1C 9.2 (H) 03/11/2020   Not ideally controlled Stressed diabetic diet and regular exercise I discussed consequences of uncontrolled sugars and the importance of getting her sugars controlled.  Discussed the negative effects it is having on her kidneys Continue farxiga 10 mg, glimepiride 2 mg, metformin 500 mg bid A1c, urine micro-we will adjust medication depending on A1c

## 2020-11-10 NOTE — Assessment & Plan Note (Signed)
Chronic GERD controlled Continue protonix 40 mg qd

## 2020-11-10 NOTE — Assessment & Plan Note (Signed)
Chronic Continue Crestor 40 mg daily LDL is at goal Encouraged regular exercise and healthy diet

## 2020-11-10 NOTE — Assessment & Plan Note (Addendum)
Chronic Denies any chest pain, palpitations or shortness of breath Continue aspirin 81 mg daily, carvedilol 3.125 mg twice daily, Crestor 40 mg daily

## 2020-11-10 NOTE — Assessment & Plan Note (Addendum)
Chronic cmp today Drinking a lot of water Avoid nsaids Blood pressure well controlled Stressed the importance of getting his sugars better controlled to avoid further damage to her kidneys

## 2020-11-17 MED ORDER — RYBELSUS 3 MG PO TABS: 3.0000 mg | ORAL_TABLET | Freq: Every day | ORAL | 0 refills | Status: DC

## 2020-11-18 ENCOUNTER — Telehealth: Payer: Self-pay | Admitting: Internal Medicine

## 2020-11-18 NOTE — Telephone Encounter (Signed)
   Patient called and would like to discuss patient assistance for Rybelsus. She can be reached at [phone]

## 2020-11-22 NOTE — Telephone Encounter (Signed)
Patient has [facility] PDP insurance and reports copay for Rybelsus is cost prohibitive at this time.  Reviewed application process for [employer] patient assistance program. Patient meets income/out of pocket spend criteria for the program. Patient will provide proof of income, out of pocket spend report, and will sign application. Will collaborate with prescriber Dr Quay Burow for the provider portion of application. Once completed, application will be submitted via Fax   Patient assistance program Fax number: [phone]  Patient will come to office to sign paperwork.  Left paperwork in front office.  Charlton Haws, [facility]

## 2020-12-01 ENCOUNTER — Telehealth: Payer: Self-pay | Admitting: Pharmacist

## 2020-12-01 NOTE — Progress Notes (Signed)
Called [employer] to check the status of patient assistance for Rybelsus, spoke with Amber patient was approved through 04/13/21. It was been fulfilled and can take up to 1-14 business days to receive.  Orinda Kenner, [place] Clinical Pharmacists Assistant [phone]  Time Spent:  4

## 2020-12-01 NOTE — Chronic Care Management (AMB) (Addendum)
Chronic Care Management Pharmacy Assistant   Name: Kathryn Logan  MRN: [id number] DOB: [birth year]  Reason for Encounter: Disease State - Diabetes  Recent office visits:  11/10/20 Billey Gosling, MD - Coronary Artery Disease - labs ordered, DEXA scan ordered, no medication changes, Follow up 3 months.  Recent consult visits:  None noted.  Hospital visits:  None in previous 6 months  Medications: Outpatient Encounter Medications as of 12/01/2020  Medication Sig   aspirin EC 81 MG tablet Take 81 mg by mouth daily.    BIOTIN PO Take 10,000 mcg by mouth daily.    bisacodyl (DULCOLAX) 5 MG EC tablet Take 5 mg by mouth 3 (three) times a week.   Calcium Carb-Cholecalciferol (CALCIUM + VITAMIN D3 PO) Take 600 mg by mouth. 600 mg- 25 mcg twice daily   carvedilol (COREG) 3.125 MG tablet Take 1 tablet (3.125 mg total) by mouth 2 (two) times daily.   Cholecalciferol (VITAMIN D3) 50 MCG (2000 UT) TABS Take by mouth.   dapagliflozin propanediol (FARXIGA) 10 MG TABS tablet Take 1 tablet (10 mg total) by mouth daily before breakfast.   famotidine (PEPCID) 20 MG tablet Take 1 tablet (20 mg total) by mouth 2 (two) times daily.   glimepiride (AMARYL) 2 MG tablet TAKE 1 TABLET(2 MG) BY MOUTH DAILY WITH BREAKFAST   glucose blood (ONE TOUCH ULTRA TEST) test strip Check blood sugar daily as directed, DX 250.02   loratadine (CLARITIN) 10 MG tablet Take 1 tablet (10 mg total) by mouth daily.   metFORMIN (GLUCOPHAGE) 500 MG tablet TAKE 1 TABLET(500 MG) BY MOUTH TWICE DAILY WITH A MEAL   ONETOUCH DELICA LANCETS FINE MISC 1 each by Does not apply route as directed. Check blood sugar daily as directed, DX 250.02 HOLD UNTIL PATIENT REQUEST   pantoprazole (PROTONIX) 40 MG tablet Take 1 tablet (40 mg total) by mouth daily.   rosuvastatin (CRESTOR) 40 MG tablet TAKE 1 TABLET BY MOUTH EVERY NIGHT AT BEDTIME   Semaglutide (RYBELSUS) 3 MG TABS Take 3 mg by mouth daily after breakfast.   senna-docusate (SENOKOT-S)  8.6-50 MG tablet Take 1 tablet by mouth at bedtime.   sertraline (ZOLOFT) 50 MG tablet Take 1 tablet (50 mg total) by mouth daily.   vitamin B-12 (CYANOCOBALAMIN) 1000 MCG tablet Take 1,000 mcg by mouth daily.   No facility-administered encounter medications on file as of 12/01/2020.   Recent Relevant Labs: Lab Results  Component Value Date/Time   HGBA1C 9.8 (H) 11/10/2020 09:35 AM   HGBA1C 9.2 (H) 03/11/2020 09:13 AM   MICROALBUR 3.5 (H) 07/23/2019 07:47 AM   MICROALBUR 2.9 (H) 04/13/2015 11:46 AM    Kidney Function Lab Results  Component Value Date/Time   CREATININE 1.12 11/10/2020 09:35 AM   CREATININE 1.14 03/11/2020 09:13 AM   GFR 48.78 (L) 11/10/2020 09:35 AM   GFRNONAA 37 (L) 11/23/2017 04:49 AM   GFRAA 43 (L) 11/23/2017 04:49 AM    Current antihyperglycemic regimen:  Metformin 500 mg 1 tab twice daily,  Farxiga 10 mg 1 tab daily   Glimepiride 2 mg 1 tab daily  Adherence Review: Is the patient currently on a STATIN medication? Yes Is the patient currently on ACE/ARB medication? No Does the patient have >5 day gap between last estimated fill dates? No  Metformin 500 mg 1 tab twice daily last filled 08/29/20 90 days Farxiga 10 mg 1 tab daily last filled 03/02/20 30 days Glimepiride 2 mg 1 tab daily last filled 08/29/20 90  days  Star Rating Drugs: Rosuvastatin last filled 10/22/20 30ds Metformin last filled 08/29/20 90 ds  Attempted contact with [facility] 3 times on 01/03/21, 01/05/21 & 01/06/21. Unsuccessful outreach, will attempt again next month.     Orinda Kenner, [place] Clinical Pharmacists Assistant [phone]

## 2020-12-06 ENCOUNTER — Other Ambulatory Visit: Payer: Self-pay | Admitting: Internal Medicine

## 2020-12-08 ENCOUNTER — Other Ambulatory Visit: Payer: Self-pay | Admitting: Internal Medicine

## 2020-12-13 ENCOUNTER — Other Ambulatory Visit: Payer: Self-pay | Admitting: Internal Medicine

## 2020-12-16 NOTE — Telephone Encounter (Signed)
Pt notified that Rybelsus was received in office today & that she may pick up at earliest convenience.  Pt states she will come tomorrow to pick up medication from front desk.

## 2020-12-21 NOTE — Telephone Encounter (Signed)
LVM on home phone instructing pt to pick up medication from front desk.

## 2021-01-05 ENCOUNTER — Encounter: Payer: Self-pay | Admitting: Internal Medicine

## 2021-01-26 ENCOUNTER — Ambulatory Visit: Payer: Medicare Other

## 2021-01-28 ENCOUNTER — Telehealth: Payer: Medicare Other

## 2021-02-07 ENCOUNTER — Ambulatory Visit: Payer: Medicare Other

## 2021-02-07 ENCOUNTER — Other Ambulatory Visit: Payer: Self-pay | Admitting: Internal Medicine

## 2021-02-08 ENCOUNTER — Other Ambulatory Visit: Payer: Medicare Other

## 2021-02-08 ENCOUNTER — Other Ambulatory Visit: Payer: Self-pay | Admitting: Internal Medicine

## 2021-02-10 ENCOUNTER — Ambulatory Visit: Payer: Medicare Other | Admitting: Internal Medicine

## 2021-02-16 DIAGNOSIS — E119 Type 2 diabetes mellitus without complications: Secondary | ICD-10-CM | POA: Diagnosis not present

## 2021-02-16 DIAGNOSIS — H524 Presbyopia: Secondary | ICD-10-CM | POA: Diagnosis not present

## 2021-02-16 DIAGNOSIS — H5213 Myopia, bilateral: Secondary | ICD-10-CM | POA: Diagnosis not present

## 2021-02-16 DIAGNOSIS — H2513 Age-related nuclear cataract, bilateral: Secondary | ICD-10-CM | POA: Diagnosis not present

## 2021-02-16 LAB — HM DIABETES EYE EXAM

## 2021-02-21 ENCOUNTER — Other Ambulatory Visit: Payer: Medicare Other

## 2021-02-24 ENCOUNTER — Telehealth: Payer: Self-pay

## 2021-02-24 NOTE — Telephone Encounter (Signed)
Rybelsus arrived today through patient assistance.  My-chart message sent to patient as I was unable to reach her via cellphone.

## 2021-02-25 ENCOUNTER — Ambulatory Visit: Payer: Medicare Other | Admitting: Internal Medicine

## 2021-02-25 ENCOUNTER — Ambulatory Visit: Payer: Medicare Other

## 2021-03-01 ENCOUNTER — Encounter: Payer: Self-pay | Admitting: Internal Medicine

## 2021-03-01 NOTE — Progress Notes (Signed)
Outside notes received. Information abstracted. Notes sent to scan.  

## 2021-03-03 ENCOUNTER — Other Ambulatory Visit: Payer: Self-pay | Admitting: Internal Medicine

## 2021-03-07 ENCOUNTER — Other Ambulatory Visit: Payer: Self-pay | Admitting: Internal Medicine

## 2021-03-10 ENCOUNTER — Telehealth: Payer: Medicare Other

## 2021-03-10 ENCOUNTER — Telehealth: Payer: Self-pay | Admitting: Pharmacist

## 2021-03-10 DIAGNOSIS — E1159 Type 2 diabetes mellitus with other circulatory complications: Secondary | ICD-10-CM

## 2021-03-10 MED ORDER — DAPAGLIFLOZIN PROPANEDIOL 10 MG PO TABS: 10.0000 mg | ORAL_TABLET | Freq: Every day | ORAL | 0 refills | Status: DC

## 2021-03-10 NOTE — Progress Notes (Deleted)
Chronic Care Management Pharmacy Note  03/10/2021 Name:  Kathryn Logan MRN:  [id number] DOB:  Oct 24, 1945  Subjective: Kathryn Logan is an 75 y.o. year old female who is a primary patient of Burns, Claudina Lick, MD.  The CCM team was consulted for assistance with disease management and care coordination needs.    Engaged with patient by telephone for follow up visit in response to provider referral for pharmacy case management and/or care coordination services.   Consent to Services:  The patient was given information about Chronic Care Management services, agreed to services, and gave verbal consent prior to initiation of services.  Please see initial visit note for detailed documentation.   Patient Care Team: Binnie Rail, MD as PCP - General (Internal Medicine) Consuelo Pandy, PA-C as Physician Assistant (Cardiology) Debara Pickett Nadean Corwin, MD as Consulting Physician (Cardiology) Charlton Haws, [facility] (Pharmacist)  Recent office visits: 11/10/20 Dr Quay Burow OV: chronic f/u; A1c 9.8; start Rybelsus 3 mg, titrate to 7 mg after a month.  03/11/20 Dr Quay Burow OV: CPE. TSH elevated, will recheck 3 mos. GFR worse, increase fluids. A1c is 9.2%. Advised to increase metformin to 1000 mg BID - pt did not respond to [employer].  Recent consult visits: None  Hospital visits: None in previous 6 months  Objective:  Lab Results  Component Value Date   CREATININE 1.12 11/10/2020   BUN 27 (H) 11/10/2020   GFR 48.78 (L) 11/10/2020   GFRNONAA 37 (L) 11/23/2017   GFRAA 43 (L) 11/23/2017   NA 139 11/10/2020   K 4.3 11/10/2020   CALCIUM 9.6 11/10/2020   CO2 20 11/10/2020    Lab Results  Component Value Date/Time   HGBA1C 9.8 (H) 11/10/2020 09:35 AM   HGBA1C 9.2 (H) 03/11/2020 09:13 AM   GFR 48.78 (L) 11/10/2020 09:35 AM   GFR 47.98 (L) 03/11/2020 09:13 AM   MICROALBUR 3.5 (H) 07/23/2019 07:47 AM   MICROALBUR 2.9 (H) 04/13/2015 11:46 AM    Last diabetic Eye exam:  Lab  Results  Component Value Date/Time   HMDIABEYEEXA No Retinopathy 02/16/2021 12:00 AM    Last diabetic Foot exam: No results found for: HMDIABFOOTEX   Lab Results  Component Value Date   CHOL 108 11/10/2020   HDL 27.40 (L) 11/10/2020   LDLCALC 51 03/11/2020   LDLDIRECT 53.0 11/10/2020   TRIG 223.0 (H) 11/10/2020   CHOLHDL 4 11/10/2020    Hepatic Function Latest Ref Rng & Units 11/10/2020 03/11/2020 07/23/2019  Total Protein 6.0 - 8.3 g/dL 7.0 6.9 7.2  Albumin 3.5 - 5.2 g/dL 4.2 4.2 4.0  AST 0 - 37 U/L _0 ALT 0 - 35 U/L _1 Alk Phosphatase 39 - 117 U/L 76 77 79  Total Bilirubin 0.2 - 1.2 mg/dL 0.5 0.5 0.6  Bilirubin, Direct 0.0 - 0.3 mg/dL - - -    Lab Results  Component Value Date/Time   TSH 3.67 11/10/2020 09:35 AM   TSH 5.07 (H) 03/11/2020 09:13 AM   FREET4 1.09 11/27/2013 12:57 AM    CBC Latest Ref Rng & Units 03/11/2020 07/23/2019 01/01/2019  WBC 4.0 - 10.5 K/uL 10.1 9.4 8.9  Hemoglobin 12.0 - 15.0 g/dL 12.3 11.6(L) 12.0  Hematocrit 36.0 - 46.0 % 37.7 34.5(L) 35.2(L)  Platelets 150.0 - 400.0 K/uL 256.0 267.0 286.0    Lab Results  Component Value Date/Time   VD25OH 67.47 04/25/2018 09:59 AM   VD25OH 17.53 (L) 04/14/2015 03:04 PM  Clinical ASCVD: Yes  The ASCVD Risk score (Arnett DK, et al., 2019) failed to calculate for the following reasons:   The patient has a prior MI or stroke diagnosis    Depression screen [facility] 2/9 11/10/2020 09/24/2019 07/23/2019  Decreased Interest 0 0 0  Down, Depressed, Hopeless 0 0 0  PHQ - 2 Score 0 0 0  Altered sleeping - - -  Tired, decreased energy - - -  Change in appetite - - -  Feeling bad or failure about yourself  - - -  Trouble concentrating - - -  Moving slowly or fidgety/restless - - -  Suicidal thoughts - - -  PHQ-9 Score - - -  Some recent data might be hidden    Social History   Tobacco Use  Smoking Status Never  Smokeless Tobacco Never   BP Readings from Last 3 Encounters:  11/10/20 110/70   03/11/20 108/72  09/24/19 110/70   Pulse Readings from Last 3 Encounters:  11/10/20 99  03/11/20 80  09/24/19 (!) 108   Wt Readings from Last 3 Encounters:  11/10/20 124 lb (56.2 kg)  03/11/20 127 lb (57.6 kg)  09/24/19 119 lb (54 kg)   BMI Readings from Last 3 Encounters:  11/10/20 22.68 kg/m  03/11/20 23.23 kg/m  09/24/19 21.77 kg/m    Assessment/Interventions: Review of patient past medical history, allergies, medications, health status, including review of consultants reports, laboratory and other test data, was performed as part of comprehensive evaluation and provision of chronic care management services.   SDOH:  (Social Determinants of Health) assessments and interventions performed: Yes  SDOH Screenings   Alcohol Screen: Not on file  Depression (PHQ2-9): Low Risk    PHQ-2 Score: 0  Financial Resource Strain: Medium Risk   Difficulty of Paying Living Expenses: Somewhat hard  Food Insecurity: Not on file  Housing: Not on file  Physical Activity: Not on file  Social Connections: Not on file  Stress: Not on file  Tobacco Use: Low Risk    Smoking Tobacco Use: Never   Smokeless Tobacco Use: Never   Passive Exposure: Not on file  Transportation Needs: Not on file      [place]  No Known Allergies  Medications Reviewed Today     Reviewed by Binnie Rail, MD (Physician) on 11/10/20 at [place] List Status: <None>   Medication Order Taking? Sig Documenting Provider Last Dose Status Informant  aspirin EC 81 MG tablet [id number] Yes Take 81 mg by mouth daily.  [provider] Taking Active Self  BIOTIN PO [id number] Yes Take 10,000 mcg by mouth daily.  [provider] Taking Active Self  bisacodyl (DULCOLAX) 5 MG EC tablet [id number] Yes Take 5 mg by mouth 3 (three) times a week. [provider] Taking Active Self  Calcium Carb-Cholecalciferol (CALCIUM + VITAMIN D3 PO) [id number] Yes Take 600 mg by mouth. 600 mg- 25 mcg twice  daily [provider] Taking Active   carvedilol (COREG) 3.125 MG tablet [id number] Yes Take 1 tablet (3.125 mg total) by mouth 2 (two) times daily. Pixie Casino, MD Taking Active   Cholecalciferol (VITAMIN D3) 50 MCG (2000 UT) TABS [id number] Yes Take by mouth. [provider] Taking Active   dapagliflozin propanediol (FARXIGA) 10 MG TABS tablet [id number] Yes Take 1 tablet (10 mg total) by mouth daily before breakfast. Binnie Rail, MD Taking Active   famotidine (PEPCID) 20 MG tablet [id number] Yes Take 1 tablet (20 mg total)  by mouth 2 (two) times daily. Binnie Rail, MD Taking Active   glimepiride (AMARYL) 2 MG tablet [id number] Yes TAKE 1 TABLET(2 MG) BY MOUTH DAILY WITH BREAKFAST Burns, Claudina Lick, MD Taking Active   glucose blood (ONE TOUCH ULTRA TEST) test strip [id number] Yes Check blood sugar daily as directed, DX 250.02 Hendricks Limes, MD Taking Active Self  loratadine (CLARITIN) 10 MG tablet [id number] Yes Take 1 tablet (10 mg total) by mouth daily. Binnie Rail, MD Taking Active Self  metFORMIN (GLUCOPHAGE) 500 MG tablet [id number] Yes TAKE 1 TABLET(500 MG) BY MOUTH TWICE DAILY WITH A MEAL Burns, Claudina Lick, MD Taking Active   [facility] LANCETS [place] [id number] Yes 1 each by Does not apply route as directed. Check blood sugar daily as directed, DX 250.02 HOLD UNTIL PATIENT REQUEST Hendricks Limes, MD Taking Active Self  pantoprazole (PROTONIX) 40 MG tablet [id number] Yes Take 1 tablet (40 mg total) by mouth daily. Binnie Rail, MD Taking Active   rosuvastatin (CRESTOR) 40 MG tablet [id number] Yes TAKE 1 TABLET BY MOUTH EVERY NIGHT AT BEDTIME Hilty, Nadean Corwin, MD Taking Active   senna-docusate (SENOKOT-S) 8.6-50 MG tablet [id number] Yes Take 1 tablet by mouth at bedtime. Patrecia Pour, MD Taking Active   sertraline (ZOLOFT) 50 MG tablet [id number] Yes Take 1 tablet (50 mg total) by mouth daily. Binnie Rail, MD Taking Active   vitamin B-12 (CYANOCOBALAMIN) 1000  MCG tablet [id number] Yes Take 1,000 mcg by mouth daily. [provider] Taking Active Self            Patient Active Problem List   Diagnosis Date Noted   CKD (chronic kidney disease) stage 3, GFR 30-59 ml/min ([place]) 11/10/2020   Aortic atherosclerosis ([place]) 11/09/2020   Subclinical hypothyroidism 11/09/2020   Anemia 11/26/2017   Syncope 11/20/2017   Compression fracture of L1 lumbar vertebra, closed, initial encounter ([place]) 11/20/2017   Vitamin D deficiency 02/22/2016   Anxiety 02/22/2016   CAD (coronary artery disease) 02/21/2016   GERD (gastroesophageal reflux disease) 04/14/2015   S/P CABG x 3 12/01/2013   NSTEMI (non-ST elevated myocardial infarction) ([place]) 11/26/2013   Type 2 diabetes mellitus with vascular disease ([place]) 08/28/2012   Hyperlipidemia 12/18/2006   Osteopenia 10/09/2006    Immunization History  Administered Date(s) Administered   Fluad Quad(high Dose 65+) 01/22/2019, 03/11/2020   Influenza, High Dose Seasonal PF 02/22/2016, 03/14/2017, 04/25/2018   Influenza,inj,Quad PF,6+ Mos 03/03/2014, 04/14/2015   Influenza-Unspecified 02/16/2017   PFIZER(Purple Top)SARS-COV-2 Vaccination 06/19/2019, 07/16/2019   Pneumococcal Conjugate-13 04/13/2017   Pneumococcal Polysaccharide-23 08/29/2012   Tdap 08/28/2012    Conditions to be addressed/monitored:  Hypertension, Hyperlipidemia, Diabetes and Coronary Artery Disease  There are no care plans that you recently modified to display for this patient.   Compliance/Adherence/Medication fill history: Care Gaps: Colonoscopy (never done) DEXA scan (due 09/11/14) Mammogram (due 11/13/14) Foot exam (due 01/02/20) Urine MA (due 07/22/20)  Star-Rating Drugs: Rosuvastatin - LF 12/08/20 x 90 ds Metformin - LF 02/08/21 x 90 ds  Medication Assistance:  Wilder Glade obtained through [employer] medication assistance program.  Enrollment ends 05/14/21  Patient's preferred pharmacy is:  [id number] [address], [place] [address] Phone: [phone] Fax: [phone]  Uses pill box? Yes Pt endorses 100% compliance  We discussed: Current pharmacy is preferred with insurance plan and patient is satisfied with pharmacy  services Patient decided to: Continue current medication management strategy  Care Plan and Follow Up Patient Decision:  Patient agrees to Care Plan and Follow-up.  Plan: Telephone follow up appointment with care management team member scheduled for:  6 months  Charlene Brooke, PharmD, BCACP Clinical Pharmacist [facility] at [facility] [phone]   Current Barriers:  Unable to independently afford treatment regimen Unable to independently monitor therapeutic efficacy Unable to maintain control of diabetes  Pharmacist Clinical Goal(s):  Patient will verbalize ability to afford treatment regimen achieve adherence to monitoring guidelines and medication adherence to achieve therapeutic efficacy maintain control of diabetes as evidenced by improved A1c  through collaboration with PharmD and provider.    Interventions: 1:1 collaboration with Binnie Rail, MD regarding development and update of comprehensive plan of care as evidenced by provider attestation and co-signature Inter-disciplinary care team collaboration (see longitudinal plan of care) Comprehensive medication review performed; medication list updated in electronic medical record  Hypertension    BP goal < 130/80 Pt does not monitor BP at home. Patient has failed these meds in the past: lisinopril, metoprolol Patient is currently controlled on the following medications:  carvedilol 3.125 mg BID  We discussed diet and exercise extensively   Plan: Continue current medications and control with diet and exercise   Hyperlipidemia/CAD    LDL goal < 70  The patient has a prior MI or stroke diagnosis; hx CABG x  3   Patient has failed these meds in past: atorvastatin Patient is currently controlled on the following medications:  rosuvastatin 40 mg HS,  aspirin 81 mg   We discussed:  Cholesterol goals, benefits of statin. Also discussed benefits of fish oil for triglycerides, however pt states she does not want to add any more pills.   Plan: Continue current medications and control with diet and exercise   Diabetes    A1c goal < 7% Home BG readings: not checking right now   Patient has failed these meds in past: pioglitazone, Januvia Patient is currently uncontrolled on the following medications:  Metformin 500 mg BID,  Glimepiride 2 mg daily,  Farxiga 10 mg daily (via PAP) Rybelsus 7 mg daily (via PAP)  We discussed: pt reports she has discussed increasing metformin to 1000 mg BID with PCP and it was decided to wait until repeat labs next month. Pt will be running out of Farxiga in a week and needs to re-apply for patient assistance. Also discussed benefit of checking BG at home to better correlate A1c.   Plan: Continue current medications and control with diet and exercise  -Re-apply for [employer] patient assistance -Pt will be provided with Farxiga samples x 3 week supply -advised to check BG 3 times a week, varying time of day and bring log to PCP appt in April  Health Maintenance   -Vaccine gaps: Shingrix, covid booster, Flu   Patient Goals/Self-Care Activities Patient will:  - take medications as prescribed focus on medication adherence by pill box check glucose 3 times a week, varying time of day collaborate with provider on medication access solutions

## 2021-03-10 NOTE — Telephone Encounter (Signed)
  Chronic Care Management   Outreach Note  03/10/2021 Name: Kathryn Logan MRN: [id number] DOB: [birth year]  Referred by: Binnie Rail, MD  Patient had a phone appointment scheduled with clinical pharmacist today.  An unsuccessful telephone outreach was attempted today. The patient was referred to the pharmacist for assistance with medications, care management and care coordination.   Patient will NOT be penalized in any way for missing a CCM appointment. The no-show fee does not apply.  If possible, a message was left to return call to: [phone] or to [place]: [phone]   Received fax from [employer] that patient is need of a refill for Farxiga 10 mg. Sent refill to [facility] pharmacy.   Charlene Brooke, PharmD, Para [place], CPP Clinical Pharmacist [facility] [facility] at [facility] [phone]

## 2021-03-22 ENCOUNTER — Other Ambulatory Visit: Payer: Medicare Other

## 2021-03-25 ENCOUNTER — Ambulatory Visit: Payer: Medicare Other | Admitting: Internal Medicine

## 2021-03-25 ENCOUNTER — Ambulatory Visit: Payer: Medicare Other

## 2021-03-28 ENCOUNTER — Other Ambulatory Visit: Payer: Medicare Other

## 2021-03-29 ENCOUNTER — Other Ambulatory Visit: Payer: Self-pay | Admitting: Internal Medicine

## 2021-03-31 ENCOUNTER — Telehealth: Payer: Self-pay

## 2021-03-31 ENCOUNTER — Other Ambulatory Visit: Payer: Medicare Other

## 2021-03-31 NOTE — Progress Notes (Signed)
    Chronic Care Management Pharmacy Assistant   Name: Kathryn Logan  MRN: [id number] DOB: 1946-04-25  Patient is currently enrolled in [place] patient assistance program for the medication Rybelsus until date: 05/14/21 Patient would like to re-enroll in patient assistance for the next calendar year.  Renewal forms were completed on behalf of the patient. Patient has requested to sign forms in the office. Forms will be printed and placed at front desk.Patient has appointment with Dr. Quay Burow on 04/05/21  [place] Pharmacist Assistant [phone]

## 2021-04-01 DIAGNOSIS — Z23 Encounter for immunization: Secondary | ICD-10-CM | POA: Diagnosis not present

## 2021-04-04 ENCOUNTER — Telehealth: Payer: Self-pay | Admitting: Internal Medicine

## 2021-04-04 DIAGNOSIS — I25119 Atherosclerotic heart disease of native coronary artery with unspecified angina pectoris: Secondary | ICD-10-CM

## 2021-04-04 DIAGNOSIS — N1831 Chronic kidney disease, stage 3a: Secondary | ICD-10-CM

## 2021-04-04 DIAGNOSIS — D649 Anemia, unspecified: Secondary | ICD-10-CM

## 2021-04-04 DIAGNOSIS — E782 Mixed hyperlipidemia: Secondary | ICD-10-CM

## 2021-04-04 DIAGNOSIS — E559 Vitamin D deficiency, unspecified: Secondary | ICD-10-CM

## 2021-04-04 DIAGNOSIS — E038 Other specified hypothyroidism: Secondary | ICD-10-CM

## 2021-04-04 DIAGNOSIS — E1159 Type 2 diabetes mellitus with other circulatory complications: Secondary | ICD-10-CM

## 2021-04-04 NOTE — Telephone Encounter (Signed)
Blood work ordered.

## 2021-04-04 NOTE — Telephone Encounter (Signed)
My-chart message sent to patient.  Attempted to call her but no answer or voicemail set up.

## 2021-04-04 NOTE — Telephone Encounter (Signed)
Patient requesting order for labs prior to her 21mo f/u visit scheduled 04-28-2021

## 2021-04-05 ENCOUNTER — Ambulatory Visit: Payer: Medicare Other

## 2021-04-05 ENCOUNTER — Ambulatory Visit: Payer: Medicare Other | Admitting: Internal Medicine

## 2021-04-28 ENCOUNTER — Ambulatory Visit: Payer: Medicare Other | Admitting: Internal Medicine

## 2021-04-28 ENCOUNTER — Ambulatory Visit: Payer: Medicare Other

## 2021-04-29 ENCOUNTER — Other Ambulatory Visit: Payer: Self-pay | Admitting: Internal Medicine

## 2021-04-30 DIAGNOSIS — Z20822 Contact with and (suspected) exposure to covid-19: Secondary | ICD-10-CM | POA: Diagnosis not present

## 2021-05-02 ENCOUNTER — Other Ambulatory Visit: Payer: Self-pay | Admitting: Internal Medicine

## 2021-05-05 ENCOUNTER — Ambulatory Visit: Payer: Medicare Other

## 2021-05-10 ENCOUNTER — Other Ambulatory Visit: Payer: Self-pay | Admitting: Internal Medicine

## 2021-05-16 ENCOUNTER — Encounter: Payer: Self-pay | Admitting: Internal Medicine

## 2021-05-16 NOTE — Patient Instructions (Addendum)
°  Blood work was ordered.     Medications changes include :     Your prescription(s) have been submitted to your pharmacy. Please take as directed and contact our office if you believe you are having problem(s) with the medication(s).   A referral was ordered for        Someone from their office will call you to schedule an appointment.    Please followup in 3 months

## 2021-05-16 NOTE — Progress Notes (Signed)
Subjective:    Patient ID: Kathryn Logan, female    DOB: 1945/10/04, 76 y.o.   MRN: [id number]  This visit occurred during the SARS-CoV-2 public health emergency.  Safety protocols were in place, including screening questions prior to the visit, additional usage of staff PPE, and extensive cleaning of exam room while observing appropriate contact time as indicated for disinfecting solutions.     HPI The patient is here for follow up of their chronic medical problems, including DM, CAD, hld, gerd anxiety, osteopenia, subclinical hypothyroidism    Medications and allergies reviewed with patient and updated if appropriate.  Patient Active Problem List   Diagnosis Date Noted   CKD (chronic kidney disease) stage 3, GFR 30-59 ml/min ([place]) 11/10/2020   Aortic atherosclerosis ([place]) 11/09/2020   Subclinical hypothyroidism 11/09/2020   Anemia 11/26/2017   Syncope 11/20/2017   Compression fracture of L1 lumbar vertebra, closed, initial encounter ([place]) 11/20/2017   Vitamin D deficiency 02/22/2016   Anxiety 02/22/2016   CAD (coronary artery disease) 02/21/2016   GERD (gastroesophageal reflux disease) 04/14/2015   S/P CABG x 3 12/01/2013   NSTEMI (non-ST elevated myocardial infarction) ([place]) 11/26/2013   Type 2 diabetes mellitus with vascular disease ([place]) 08/28/2012   Hyperlipidemia 12/18/2006   Osteopenia 10/09/2006    Current Outpatient Medications on File Prior to Visit  Medication Sig Dispense Refill   aspirin EC 81 MG tablet Take 81 mg by mouth daily.      BIOTIN PO Take 10,000 mcg by mouth daily.      bisacodyl (DULCOLAX) 5 MG EC tablet Take 5 mg by mouth 3 (three) times a week.     Calcium Carb-Cholecalciferol (CALCIUM + VITAMIN D3 PO) Take 600 mg by mouth. 600 mg- 25 mcg twice daily     carvedilol (COREG) 3.125 MG tablet TAKE 1 TABLET(3.125 MG) BY MOUTH TWICE DAILY 180 tablet 0   Cholecalciferol (VITAMIN D3) 50 MCG (2000 UT) TABS Take by mouth.      dapagliflozin propanediol (FARXIGA) 10 MG TABS tablet Take 1 tablet (10 mg total) by mouth daily before breakfast. 90 tablet 0   famotidine (PEPCID) 20 MG tablet TAKE 1 TABLET BY MOUTH TWICE DAILY 180 tablet 1   glucose blood (ONE TOUCH ULTRA TEST) test strip Check blood sugar daily as directed, DX 250.02 100 each 12   loratadine (CLARITIN) 10 MG tablet Take 1 tablet (10 mg total) by mouth daily. 90 tablet 3   ONETOUCH DELICA LANCETS FINE MISC 1 each by Does not apply route as directed. Check blood sugar daily as directed, DX 250.02 HOLD UNTIL PATIENT REQUEST 100 each 5   pantoprazole (PROTONIX) 40 MG tablet TAKE 1 TABLET BY MOUTH DAILY (Patient not taking: Reported on 06/01/2021) 90 tablet 1   senna-docusate (SENOKOT-S) 8.6-50 MG tablet Take 1 tablet by mouth at bedtime. 30 tablet 0   sertraline (ZOLOFT) 50 MG tablet TAKE 1 TABLET(50 MG) BY MOUTH DAILY 90 tablet 1   vitamin B-12 (CYANOCOBALAMIN) 1000 MCG tablet Take 1,000 mcg by mouth daily.     No current facility-administered medications on file prior to visit.    Past Medical History:  Diagnosis Date   Compression fracture of L1 lumbar vertebra ([place]) 11/21/2017   Diabetes mellitus without complication (HCC)    Gallbladder sludge    GERD (gastroesophageal reflux disease)    Heart disease    Hyperlipidemia    NSTEMI (non-ST elevated myocardial infarction) ([place]) 11/2013   Osteopenia  Syncope 11/2017    Past Surgical History:  Procedure Laterality Date   CORONARY ARTERY BYPASS GRAFT N/A 12/01/2013   Procedure: CORONARY ARTERY BYPASS GRAFTING (CABG) x three, using left internal mammary artery, and right leg saphenous vein harvested endoscopically (LIMA to LAD, SVG to OM2 , SVG to dRCA);  Surgeon: Grace Isaac, MD;  Location: [facility];  Service: Open Heart Surgery;  Laterality: N/A;   G 1 P 1     INTRAOPERATIVE TRANSESOPHAGEAL ECHOCARDIOGRAM N/A 12/01/2013   Procedure: INTRAOPERATIVE TRANSESOPHAGEAL  ECHOCARDIOGRAM;  Surgeon: Grace Isaac, MD;  Location: [facility];  Service: Open Heart Surgery;  Laterality: N/A;   LEFT HEART CATHETERIZATION WITH CORONARY ANGIOGRAM N/A 11/27/2013   Procedure: LEFT HEART CATHETERIZATION WITH CORONARY ANGIOGRAM;  Surgeon: Sinclair Grooms, MD;  Location[facility] C[facility] LAB;  Service: Cardiovascular;  Laterality: N/A;   no colonoscopy     SOC reviewed   TONSILLECTOMY AND ADENOIDECTOMY      Social History   Socioeconomic History   Marital status: Married    Spouse name: Not on file   Number of children: Not on file   Years of education: Not on file   Highest education level: Not on file  Occupational History   Not on file  Tobacco Use   Smoking status: Never   Smokeless tobacco: Never  Vaping Use   Vaping Use: Never used  Substance and Sexual Activity   Alcohol use: Yes    Comment: Occasionally    Drug use: No   Sexual activity: Not on file  Other Topics Concern   Not on file  Social History Narrative   Not on file   Social Determinants of Health   Financial Resource Strain: Medium Risk   Difficulty of Paying Living Expenses: Somewhat hard  Food Insecurity: Not on file  Transportation Needs: Not on file  Physical Activity: Not on file  Stress: Not on file  Social Connections: Not on file    Family History  Problem Relation Age of Onset   Heart attack Mother 21   Diabetes Mother    Stroke Mother    Hypertension Mother    Goiter Mother    Heart attack Father        >55   Bladder Cancer Father    Parkinson's disease Maternal Aunt    Osteoporosis Sister    Diabetes Brother     Review of Systems     Objective:  There were no vitals filed for this visit. BP Readings from Last 3 Encounters:  06/01/21 104/60  11/10/20 110/70  03/11/20 108/72   Wt Readings from Last 3 Encounters:  06/01/21 123 lb 3.2 oz (55.9 kg)  11/10/20 124 lb (56.2 kg)  03/11/20 127 lb (57.6 kg)   There is no height or weight on  file to calculate BMI.   Physical Exam    Constitutional: Appears well-developed and well-nourished. No distress.  HENT:  Head: Normocephalic and atraumatic.  Neck: Neck supple. No tracheal deviation present. No thyromegaly present.  No cervical lymphadenopathy Cardiovascular: Normal rate, regular rhythm and normal heart sounds.   No murmur heard. No carotid bruit .  No edema Pulmonary/Chest: Effort normal and breath sounds normal. No respiratory distress. No has no wheezes. No rales.  Skin: Skin is warm and dry. Not diaphoretic.  Psychiatric: Normal mood and affect. Behavior is normal.      Assessment & Plan:    See Problem List for Assessment and Plan of chronic medical problems.  This encounter was created in error - please disregard.

## 2021-05-18 ENCOUNTER — Ambulatory Visit: Payer: Medicare Other

## 2021-05-18 ENCOUNTER — Encounter: Payer: Medicare Other | Admitting: Internal Medicine

## 2021-05-18 DIAGNOSIS — F419 Anxiety disorder, unspecified: Secondary | ICD-10-CM

## 2021-05-18 DIAGNOSIS — N1831 Chronic kidney disease, stage 3a: Secondary | ICD-10-CM

## 2021-05-18 DIAGNOSIS — K219 Gastro-esophageal reflux disease without esophagitis: Secondary | ICD-10-CM

## 2021-05-18 DIAGNOSIS — E782 Mixed hyperlipidemia: Secondary | ICD-10-CM

## 2021-05-18 DIAGNOSIS — I25119 Atherosclerotic heart disease of native coronary artery with unspecified angina pectoris: Secondary | ICD-10-CM

## 2021-05-18 DIAGNOSIS — M85869 Other specified disorders of bone density and structure, unspecified lower leg: Secondary | ICD-10-CM

## 2021-05-18 DIAGNOSIS — E1159 Type 2 diabetes mellitus with other circulatory complications: Secondary | ICD-10-CM

## 2021-05-18 DIAGNOSIS — E038 Other specified hypothyroidism: Secondary | ICD-10-CM

## 2021-05-31 ENCOUNTER — Encounter: Payer: Self-pay | Admitting: Internal Medicine

## 2021-05-31 ENCOUNTER — Other Ambulatory Visit: Payer: Self-pay | Admitting: Internal Medicine

## 2021-05-31 NOTE — Progress Notes (Signed)
Subjective:    Patient ID: Kathryn Logan, female    DOB: Nov 26, 1945, 76 y.o.   MRN: [id number]  This visit occurred during the SARS-CoV-2 public health emergency.  Safety protocols were in place, including screening questions prior to the visit, additional usage of staff PPE, and extensive cleaning of exam room while observing appropriate contact time as indicated for disinfecting solutions.     HPI The patient is here for follow up of their chronic medical problems, including DM, CAD, hld, gerd anxiety, osteopenia, subclinical hypothyroidism  She is watching her sugars and carbs.  She drinks gingerale zero.  She is active but no exercise.    She stopped the Rybelsus because she started to experience her hair falling out and was concerned it was related to the medication.  She is taking her other diabetic medications.  She also started the pantoprazole-heartburn is controlled on the famotidine.   Medications and allergies reviewed with patient and updated if appropriate.  Patient Active Problem List   Diagnosis Date Noted   CKD (chronic kidney disease) stage 3, GFR 30-59 ml/min ([place]) 11/10/2020   Aortic atherosclerosis ([place]) 11/09/2020   Subclinical hypothyroidism 11/09/2020   Anemia 11/26/2017   Syncope 11/20/2017   Compression fracture of L1 lumbar vertebra, closed, initial encounter ([place]) 11/20/2017   Vitamin D deficiency 02/22/2016   Anxiety 02/22/2016   CAD (coronary artery disease) 02/21/2016   GERD (gastroesophageal reflux disease) 04/14/2015   S/P CABG x 3 12/01/2013   NSTEMI (non-ST elevated myocardial infarction) ([place]) 11/26/2013   Type 2 diabetes mellitus with vascular disease ([place]) 08/28/2012   Hyperlipidemia 12/18/2006   Osteopenia 10/09/2006    Current Outpatient Medications on File Prior to Visit  Medication Sig Dispense Refill   aspirin EC 81 MG tablet Take 81 mg by mouth daily.      BIOTIN PO Take 10,000 mcg by mouth daily.      bisacodyl  (DULCOLAX) 5 MG EC tablet Take 5 mg by mouth 3 (three) times a week.     Calcium Carb-Cholecalciferol (CALCIUM + VITAMIN D3 PO) Take 600 mg by mouth. 600 mg- 25 mcg twice daily     carvedilol (COREG) 3.125 MG tablet TAKE 1 TABLET(3.125 MG) BY MOUTH TWICE DAILY 180 tablet 0   Cholecalciferol (VITAMIN D3) 50 MCG (2000 UT) TABS Take by mouth.     dapagliflozin propanediol (FARXIGA) 10 MG TABS tablet Take 1 tablet (10 mg total) by mouth daily before breakfast. 90 tablet 0   famotidine (PEPCID) 20 MG tablet TAKE 1 TABLET BY MOUTH TWICE DAILY 180 tablet 1   glimepiride (AMARYL) 2 MG tablet TAKE 1 TABLET(2 MG) BY MOUTH DAILY WITH BREAKFAST 90 tablet 0   glucose blood (ONE TOUCH ULTRA TEST) test strip Check blood sugar daily as directed, DX 250.02 100 each 12   loratadine (CLARITIN) 10 MG tablet Take 1 tablet (10 mg total) by mouth daily. 90 tablet 3   metFORMIN (GLUCOPHAGE) 500 MG tablet TAKE 1 TABLET(500 MG) BY MOUTH TWICE DAILY WITH A MEAL 180 tablet 0   ONETOUCH DELICA LANCETS FINE MISC 1 each by Does not apply route as directed. Check blood sugar daily as directed, DX 250.02 HOLD UNTIL PATIENT REQUEST 100 each 5   rosuvastatin (CRESTOR) 40 MG tablet Take 1 tablet (40 mg total) by mouth daily. PATIENT NEED TO SCHEDULE APPOINTMENT FOR FUTURE REFILLS 30 tablet 0   senna-docusate (SENOKOT-S) 8.6-50 MG tablet Take 1 tablet by mouth at bedtime. 30 tablet  0   sertraline (ZOLOFT) 50 MG tablet TAKE 1 TABLET(50 MG) BY MOUTH DAILY 90 tablet 1   vitamin B-12 (CYANOCOBALAMIN) 1000 MCG tablet Take 1,000 mcg by mouth daily.     pantoprazole (PROTONIX) 40 MG tablet TAKE 1 TABLET BY MOUTH DAILY (Patient not taking: Reported on 06/01/2021) 90 tablet 1   No current facility-administered medications on file prior to visit.    Past Medical History:  Diagnosis Date   Compression fracture of L1 lumbar vertebra ([place]) 11/21/2017   Diabetes mellitus without complication (HCC)    Gallbladder sludge    GERD  (gastroesophageal reflux disease)    Heart disease    Hyperlipidemia    NSTEMI (non-ST elevated myocardial infarction) ([place]) 11/2013   Osteopenia    Syncope 11/2017    Past Surgical History:  Procedure Laterality Date   CORONARY ARTERY BYPASS GRAFT N/A 12/01/2013   Procedure: CORONARY ARTERY BYPASS GRAFTING (CABG) x three, using left internal mammary artery, and right leg saphenous vein harvested endoscopically (LIMA to LAD, SVG to OM2 , SVG to dRCA);  Surgeon: Grace Isaac, MD;  Location: [place];  Service: Open Heart Surgery;  Laterality: N/A;   G 1 P 1     INTRAOPERATIVE TRANSESOPHAGEAL ECHOCARDIOGRAM N/A 12/01/2013   Procedure: INTRAOPERATIVE TRANSESOPHAGEAL ECHOCARDIOGRAM;  Surgeon: Grace Isaac, MD;  Location: [place];  Service: Open Heart Surgery;  Laterality: N/A;   LEFT HEART CATHETERIZATION WITH CORONARY ANGIOGRAM N/A 11/27/2013   Procedure: LEFT HEART CATHETERIZATION WITH CORONARY ANGIOGRAM;  Surgeon: Sinclair Grooms, MD;  Location[facility] CATH LAB;  Service: Cardiovascular;  Laterality: N/A;   no colonoscopy     SOC reviewed   TONSILLECTOMY AND ADENOIDECTOMY      Social History   Socioeconomic History   Marital status: Married    Spouse name: Not on file   Number of children: Not on file   Years of education: Not on file   Highest education level: Not on file  Occupational History   Not on file  Tobacco Use   Smoking status: Never   Smokeless tobacco: Never  Vaping Use   Vaping Use: Never used  Substance and Sexual Activity   Alcohol use: Yes    Comment: Occasionally    Drug use: No   Sexual activity: Not on file  Other Topics Concern   Not on file  Social History Narrative   Not on file   Social Determinants of Health   Financial Resource Strain: Medium Risk   Difficulty of Paying Living Expenses: Somewhat hard  Food Insecurity: Not on file  Transportation Needs: Not on file  Physical Activity: Not on file  Stress: Not on file  Social Connections:  Not on file    Family History  Problem Relation Age of Onset   Heart attack Mother 90   Diabetes Mother    Stroke Mother    Hypertension Mother    Goiter Mother    Heart attack Father        >55   Bladder Cancer Father    Parkinson's disease Maternal Aunt    Osteoporosis Sister    Diabetes Brother     Review of Systems  Constitutional:  Negative for chills and fever.  Respiratory:  Negative for cough, shortness of breath and wheezing.   Cardiovascular:  Negative for chest pain, palpitations and leg swelling.  Neurological:  Negative for dizziness, light-headedness, numbness and headaches.      Objective:   Vitals:   06/01/21  1041  BP: 104/60  Pulse: 80  Temp: 98 F (36.7 C)  SpO2: 97%   BP Readings from Last 3 Encounters:  06/01/21 104/60  11/10/20 110/70  03/11/20 108/72   Wt Readings from Last 3 Encounters:  06/01/21 123 lb 3.2 oz (55.9 kg)  11/10/20 124 lb (56.2 kg)  03/11/20 127 lb (57.6 kg)   Body mass index is 22.53 kg/m.   Physical Exam    Constitutional: Appears well-developed and well-nourished. No distress.  HENT:  Head: Normocephalic and atraumatic.  Neck: Neck supple. No tracheal deviation present. No thyromegaly present.  No cervical lymphadenopathy Cardiovascular: Normal rate, regular rhythm and normal heart sounds.   No murmur heard. No carotid bruit .  No edema Pulmonary/Chest: Effort normal and breath sounds normal. No respiratory distress. No has no wheezes. No rales.  Skin: Skin is warm and dry. Not diaphoretic.  Psychiatric: Normal mood and affect. Behavior is normal.      Assessment & Plan:    See Problem List for Assessment and Plan of chronic medical problems.

## 2021-05-31 NOTE — Patient Instructions (Addendum)
° °  Schedule your mammogram and bone density at [facility] - [phone]    Blood work was ordered.     Medications changes include :   none    Please followup in 3 months

## 2021-06-01 ENCOUNTER — Ambulatory Visit: Payer: Medicare Other

## 2021-06-01 ENCOUNTER — Other Ambulatory Visit: Payer: Self-pay

## 2021-06-01 ENCOUNTER — Ambulatory Visit (INDEPENDENT_AMBULATORY_CARE_PROVIDER_SITE_OTHER): Payer: Medicare Other | Admitting: Internal Medicine

## 2021-06-01 VITALS — BP 104/60 | HR 80 | Temp 98.0°F | Ht 62.0 in | Wt 123.2 lb

## 2021-06-01 DIAGNOSIS — E782 Mixed hyperlipidemia: Secondary | ICD-10-CM | POA: Diagnosis not present

## 2021-06-01 DIAGNOSIS — E1159 Type 2 diabetes mellitus with other circulatory complications: Secondary | ICD-10-CM | POA: Diagnosis not present

## 2021-06-01 DIAGNOSIS — I25119 Atherosclerotic heart disease of native coronary artery with unspecified angina pectoris: Secondary | ICD-10-CM

## 2021-06-01 DIAGNOSIS — E038 Other specified hypothyroidism: Secondary | ICD-10-CM

## 2021-06-01 DIAGNOSIS — N1831 Chronic kidney disease, stage 3a: Secondary | ICD-10-CM | POA: Diagnosis not present

## 2021-06-01 DIAGNOSIS — K219 Gastro-esophageal reflux disease without esophagitis: Secondary | ICD-10-CM

## 2021-06-01 DIAGNOSIS — F419 Anxiety disorder, unspecified: Secondary | ICD-10-CM

## 2021-06-01 DIAGNOSIS — M85869 Other specified disorders of bone density and structure, unspecified lower leg: Secondary | ICD-10-CM

## 2021-06-01 LAB — LIPID PANEL
Cholesterol: 104 mg/dL (ref 0–200)
HDL: 32.1 mg/dL — ABNORMAL LOW (ref >39.00)
LDL Cholesterol: 40 mg/dL (ref 0–99)
NonHDL: 72.11
Total CHOL/HDL Ratio: 3
Triglycerides: 162 mg/dL — ABNORMAL HIGH (ref 0.0–149.0)
VLDL: 32.4 mg/dL (ref 0.0–40.0)

## 2021-06-01 LAB — COMPREHENSIVE METABOLIC PANEL
ALT: 9 U/L (ref 0–35)
AST: 13 U/L (ref 0–37)
Albumin: 4.3 g/dL (ref 3.5–5.2)
Alkaline Phosphatase: 67 U/L (ref 39–117)
BUN: 26 mg/dL — ABNORMAL HIGH (ref 6–23)
CO2: 22 mEq/L (ref 19–32)
Calcium: 9.8 mg/dL (ref 8.4–10.5)
Chloride: 109 mEq/L (ref 96–112)
Creatinine, Ser: 1.12 mg/dL (ref 0.40–1.20)
GFR: 48.59 mL/min — ABNORMAL LOW (ref >60.00)
Glucose, Bld: 193 mg/dL — ABNORMAL HIGH (ref 70–99)
Potassium: 4.5 mEq/L (ref 3.5–5.1)
Sodium: 141 mEq/L (ref 135–145)
Total Bilirubin: 0.6 mg/dL (ref 0.2–1.2)
Total Protein: 7.2 g/dL (ref 6.0–8.3)

## 2021-06-01 LAB — TSH: TSH: 2.2 u[IU]/mL (ref 0.35–5.50)

## 2021-06-01 LAB — HEMOGLOBIN A1C: Hgb A1c MFr Bld: 8.6 % — ABNORMAL HIGH (ref 4.6–6.5)

## 2021-06-01 NOTE — Assessment & Plan Note (Signed)
Chronic Controlled, Stable Continue sertraline 50 mg daily 

## 2021-06-01 NOTE — Assessment & Plan Note (Signed)
Chronic DEXA ordered previously-advised that she call to get this scheduled Stressed regular exercise Continue calcium and vitamin D

## 2021-06-01 NOTE — Assessment & Plan Note (Addendum)
Chronic GERD controlled with diet Continue famotidine 20 mg bid Continue pantoprazole 40 mg prn or can take an otc medication

## 2021-06-01 NOTE — Assessment & Plan Note (Signed)
Chronic Denies any chest pain, palpitations or shortness of breath She is active, but not exercising regularly-encouraged regular exercise Stressed that we need to get her sugars better controlled and the damage that he could be doing to her organs Continue aspirin 81 mg daily, carvedilol 3.125 mg twice daily and Crestor 40 mg daily

## 2021-06-01 NOTE — Assessment & Plan Note (Addendum)
Chronic Discussed that she has stage III kidney disease Discussed that her sugars not being controlled we will further damage her kidneys and the importance of getting her sugars better controlled Advised plenty of water throughout the day Advised to avoid NSAIDs Blood pressure controlled CMP

## 2021-06-01 NOTE — Assessment & Plan Note (Signed)
Chronic Regular exercise and healthy diet encouraged Check lipid panel  Continue rosuvastatin 40 mg daily 

## 2021-06-01 NOTE — Assessment & Plan Note (Signed)
Chronic Not ideally controlled She did not tolerate Rybelsus and did not stop medication Continue glimepiride 2 mg daily, metformin 500 mg twice daily and Farxiga 10 mg daily Check A1c, urine microalbumin today Discussed that her sugars are not likely controlled since they have not been and she is not taking the Rybelsus-stressed the importance of getting sugars controlled and the consequences of uncontrolled sugars Stressed diabetic diet, regular exercise Discussed that most likely we will need to make a couple of changes in medication in order to get her sugars controlled Will adjust medication after blood work returns

## 2021-06-01 NOTE — Assessment & Plan Note (Signed)
Chronic  Clinically euthyroid Check tsh    

## 2021-06-02 LAB — MICROALBUMIN / CREATININE URINE RATIO
Creatinine,U: 79.2 mg/dL
Microalb Creat Ratio: 22.5 mg/g (ref 0.0–30.0)
Microalb, Ur: 17.8 mg/dL — ABNORMAL HIGH (ref 0.0–1.9)

## 2021-06-02 MED ORDER — GLIMEPIRIDE 4 MG PO TABS: 4.0000 mg | ORAL_TABLET | Freq: Every day | ORAL | 1 refills | Status: DC

## 2021-06-02 MED ORDER — METFORMIN HCL 500 MG PO TABS
1000.0000 mg | ORAL_TABLET | Freq: Two times a day (BID) | ORAL | 1 refills | Status: DC
Start: 2021-06-02 — End: 2022-07-14

## 2021-06-02 NOTE — Addendum Note (Signed)
Addended by: Binnie Rail on: 06/02/2021 09:16 PM   Modules accepted: Orders

## 2021-06-09 ENCOUNTER — Ambulatory Visit (INDEPENDENT_AMBULATORY_CARE_PROVIDER_SITE_OTHER): Payer: Medicare Other

## 2021-06-09 ENCOUNTER — Other Ambulatory Visit: Payer: Self-pay

## 2021-06-09 DIAGNOSIS — Z0000 Encounter for general adult medical examination without abnormal findings: Secondary | ICD-10-CM | POA: Diagnosis not present

## 2021-06-09 NOTE — Progress Notes (Signed)
I connected with Kathryn Logan today by telephone and verified that I am speaking with the correct person using two identifiers. Location patient: home Location provider: work Persons participating in the virtual visit: patient, provider.   I discussed the limitations, risks, security and privacy concerns of performing an evaluation and management service by telephone and the availability of in person appointments. I also discussed with the patient that there may be a patient responsible charge related to this service. The patient expressed understanding and verbally consented to this telephonic visit.    Interactive audio and video telecommunications were attempted between this provider and patient, however failed, due to patient having technical difficulties OR patient did not have access to video capability.  We continued and completed visit with audio only.  Some vital signs may be absent or patient reported.   Time Spent with patient on telephone encounter: 40 minutes  Subjective:   Kathryn Logan is a 76 y.o. female who presents for Medicare Annual (Subsequent) preventive examination.  Review of Systems     Cardiac Risk Factors include: advanced age (>49men, >61 women);diabetes mellitus;dyslipidemia;family history of premature cardiovascular disease;hypertension     Objective:    There were no vitals filed for this visit. There is no height or weight on file to calculate BMI.  Advanced Directives 06/09/2021 09/24/2019 11/21/2017 11/26/2013  Does Patient Have a Medical Advance Directive? No No No Patient has advance directive, copy not in chart  Type of Advance Directive - - - [place]  Does patient want to make changes to medical advance directive? - - - No change requested  Copy of [place] in Chart? - - - Copy requested from family  Would patient like information on creating a medical advance directive? No - Patient declined Yes (ED -  Information included in AVS) No - Patient declined -  Pre-existing out of facility DNR order (yellow form or pink MOST form) - - - No    Current Medications (verified) Outpatient Encounter Medications as of 06/09/2021  Medication Sig   glimepiride (AMARYL) 4 MG tablet Take 1 tablet (4 mg total) by mouth daily before breakfast.   metFORMIN (GLUCOPHAGE) 500 MG tablet Take 2 tablets (1,000 mg total) by mouth 2 (two) times daily with a meal.   aspirin EC 81 MG tablet Take 81 mg by mouth daily.    BIOTIN PO Take 10,000 mcg by mouth daily.    bisacodyl (DULCOLAX) 5 MG EC tablet Take 5 mg by mouth 3 (three) times a week.   Calcium Carb-Cholecalciferol (CALCIUM + VITAMIN D3 PO) Take 600 mg by mouth. 600 mg- 25 mcg twice daily   carvedilol (COREG) 3.125 MG tablet TAKE 1 TABLET(3.125 MG) BY MOUTH TWICE DAILY   Cholecalciferol (VITAMIN D3) 50 MCG (2000 UT) TABS Take by mouth.   dapagliflozin propanediol (FARXIGA) 10 MG TABS tablet Take 1 tablet (10 mg total) by mouth daily before breakfast.   famotidine (PEPCID) 20 MG tablet TAKE 1 TABLET BY MOUTH TWICE DAILY   glucose blood (ONE TOUCH ULTRA TEST) test strip Check blood sugar daily as directed, DX 250.02   loratadine (CLARITIN) 10 MG tablet Take 1 tablet (10 mg total) by mouth daily.   ONETOUCH DELICA LANCETS FINE MISC 1 each by Does not apply route as directed. Check blood sugar daily as directed, DX 250.02 HOLD UNTIL PATIENT REQUEST   pantoprazole (PROTONIX) 40 MG tablet TAKE 1 TABLET BY MOUTH DAILY (Patient not taking: Reported on 06/01/2021)  rosuvastatin (CRESTOR) 40 MG tablet Take 1 tablet (40 mg total) by mouth daily. PATIENT NEED TO SCHEDULE APPOINTMENT FOR FUTURE REFILLS   senna-docusate (SENOKOT-S) 8.6-50 MG tablet Take 1 tablet by mouth at bedtime.   sertraline (ZOLOFT) 50 MG tablet TAKE 1 TABLET(50 MG) BY MOUTH DAILY   vitamin B-12 (CYANOCOBALAMIN) 1000 MCG tablet Take 1,000 mcg by mouth daily.   No facility-administered encounter  medications on file as of 06/09/2021.    Allergies (verified) Patient has no known allergies.   History: Past Medical History:  Diagnosis Date   Compression fracture of L1 lumbar vertebra ([place]) 11/21/2017   Diabetes mellitus without complication (HCC)    Gallbladder sludge    GERD (gastroesophageal reflux disease)    Heart disease    Hyperlipidemia    NSTEMI (non-ST elevated myocardial infarction) ([place]) 11/2013   Osteopenia    Syncope 11/2017   Past Surgical History:  Procedure Laterality Date   CORONARY ARTERY BYPASS GRAFT N/A 12/01/2013   Procedure: CORONARY ARTERY BYPASS GRAFTING (CABG) x three, using left internal mammary artery, and right leg saphenous vein harvested endoscopically (LIMA to LAD, SVG to OM2 , SVG to dRCA);  Surgeon: Grace Isaac, MD;  Location: [facility];  Service: Open Heart Surgery;  Laterality: N/A;   G 1 P 1     INTRAOPERATIVE TRANSESOPHAGEAL ECHOCARDIOGRAM N/A 12/01/2013   Procedure: INTRAOPERATIVE TRANSESOPHAGEAL ECHOCARDIOGRAM;  Surgeon: Grace Isaac, MD;  Location: [facility];  Service: Open Heart Surgery;  Laterality: N/A;   LEFT HEART CATHETERIZATION WITH CORONARY ANGIOGRAM N/A 11/27/2013   Procedure: LEFT HEART CATHETERIZATION WITH CORONARY ANGIOGRAM;  Surgeon: Sinclair Grooms, MD;  Location[facility] C[facility] LAB;  Service: Cardiovascular;  Laterality: N/A;   no colonoscopy     SOC reviewed   TONSILLECTOMY AND ADENOIDECTOMY     Family History  Problem Relation Age of Onset   Heart attack Mother 57   Diabetes Mother    Stroke Mother    Hypertension Mother    Goiter Mother    Heart attack Father        >55   Bladder Cancer Father    Parkinson's disease Maternal Aunt    Osteoporosis Sister    Diabetes Brother    Social History   Socioeconomic History   Marital status: Married    Spouse name: Not on file   Number of children: Not on file   Years of education: Not on file   Highest education level: Not on file  Occupational History   Not on file   Tobacco Use   Smoking status: Never   Smokeless tobacco: Never  Vaping Use   Vaping Use: Never used  Substance and Sexual Activity   Alcohol use: Yes    Comment: Occasionally    Drug use: No   Sexual activity: Not on file  Other Topics Concern   Not on file  Social History Narrative   Not on file   Social Determinants of Health   Financial Resource Strain: Low Risk    Difficulty of Paying Living Expenses: Not hard at all  Food Insecurity: No Food Insecurity   Worried About Charity fundraiser in the Last Year: Never true   [place] in the Last Year: Never true  Transportation Needs: No Transportation Needs   Lack of Transportation (Medical): No   Lack of Transportation (Non-Medical): No  Physical Activity: Inactive   Days of Exercise per Week: 0 days   Minutes of Exercise  per Session: 0 min  Stress: No Stress Concern Present   Feeling of Stress : Not at all  Social Connections: Not on file    Tobacco Counseling Counseling given: Not Answered   Clinical Intake:  Pre-visit preparation completed: Yes  Pain : No/denies pain     Nutritional Risks: None Diabetes: Yes CBG done?: No Did pt. bring in CBG monitor from home?: No  How often do you need to have someone help you when you read instructions, pamphlets, or other written materials from your doctor or pharmacy?: 1 - Never  Diabetic? Yes  Interpreter Needed?: No  Information entered by :: Lisette Abu, LPN   Activities of Daily Living In your present state of health, do you have any difficulty performing the following activities: 06/09/2021 06/01/2021  Hearing? N N  Vision? N N  Difficulty concentrating or making decisions? N N  Walking or climbing stairs? N N  Dressing or bathing? N N  Doing errands, shopping? N N  Preparing Food and eating ? N -  Using the Toilet? N -  In the past six months, have you accidently leaked urine? N -  Do you have problems with loss of bowel control? N -   Managing your Medications? N -  Managing your Finances? N -  Housekeeping or managing your Housekeeping? N -  Some recent data might be hidden    Patient Care Team: Binnie Rail, MD as PCP - General (Internal Medicine) Georgena Spurling as Physician Assistant (Cardiology) Debara Pickett Nadean Corwin, MD as Consulting Physician (Cardiology) Sz[place]at, Darnelle Maffucci, [facility] (Pharmacist) Melissa Noon, [place] as Referring Physician (Optometry)  Indicate any recent Medical Services you may have received from other than [facility] providers in the past year (date may be approximate).     Assessment:   This is a routine wellness examination for Kathryn Logan.  Hearing/Vision screen Hearing Screening - Comments:: Patient denied any hearing difficulty.   No hearing aids.  Vision Screening - Comments:: Patient wears corrective glasses/contacts.  Eye exam done annually by: Dr. Melissa Noon  Dietary issues and exercise activities discussed: Current Exercise Habits: The patient does not participate in regular exercise at present, Exercise limited by: None identified   Goals Addressed               This Visit's Progress     Patient Stated (pt-stated)        My goal is to get back into being physically active.      Depression Screen PHQ 2/9 Scores 06/09/2021 11/10/2020 09/24/2019 07/23/2019 04/25/2018 04/13/2017 04/14/2015  PHQ - 2 Score 0 0 0 0 0 0 0  PHQ- 9 Score - - - - 3 - -    Fall Risk Fall Risk  06/09/2021 11/10/2020 09/24/2019 07/23/2019 04/25/2018  Falls in the past year? 0 0 1 0 0  Number falls in past yr: 0 - 1 0 0  Injury with Fall? 0 0 0 0 -  Risk for fall due to : No Fall Risks - Other (Comment) - -  Risk for fall due to: Comment - - tripped over a cord and trying to push garbage can from road. - -  Follow up Falls evaluation completed - Falls evaluation completed;Education provided;Falls prevention discussed - -    FALL RISK PREVENTION PERTAINING TO THE HOME:  Any stairs in or around the home?  No  If so, are there any without handrails? No  Home free of loose throw rugs in walkways, pet  beds, electrical cords, etc? Yes  Adequate lighting in your home to reduce risk of falls? Yes   ASSISTIVE DEVICES UTILIZED TO PREVENT FALLS:  Life alert? No  Use of a cane, walker or w/c? No  Grab bars in the bathroom? Yes  Shower chair or bench in shower? No  Elevated toilet seat or a handicapped toilet? No   TIMED UP AND GO:  Was the test performed? No .  Length of time to ambulate 10 feet: n/a sec.   Gait steady and fast without use of assistive device (per patient)  Cognitive Function: Normal cognitive status assessed by direct observation by this Nurse Health Advisor. No abnormalities found.       6CIT Screen 09/24/2019  What Year? 0 points  What month? 0 points  What time? 0 points  Count back from 20 0 points  Months in reverse 0 points  Repeat phrase 0 points  Total Score 0    Immunizations Immunization History  Administered Date(s) Administered   Fluad Quad(high Dose 65+) 01/22/2019, 03/11/2020   Influenza, High Dose Seasonal PF 02/22/2016, 03/14/2017, 04/25/2018   Influenza,inj,Quad PF,6+ Mos 03/03/2014, 04/14/2015   Influenza-Unspecified 02/16/2017, 04/01/2021   PFIZER(Purple Top)SARS-COV-2 Vaccination 06/19/2019, 07/16/2019, 04/13/2020   Pfizer Covid-19 Vaccine Bivalent Booster 11yrs & up 04/01/2021   Pneumococcal Conjugate-13 04/13/2017   Pneumococcal Polysaccharide-23 08/29/2012   Tdap 08/28/2012    TDAP status: Up to date  Flu Vaccine status: Up to date  Pneumococcal vaccine status: Up to date  Covid-19 vaccine status: Completed vaccines  Qualifies for Shingles Vaccine? Yes   Zostavax completed No   Shingrix Completed?: No.    Education has been provided regarding the importance of this vaccine. Patient has been advised to call insurance company to determine out of pocket expense if they have not yet received this vaccine. Advised may also receive  vaccine at local pharmacy or Health Dept. Verbalized acceptance and understanding.  Screening Tests Health Maintenance  Topic Date Due   Zoster Vaccines- Shingrix (1 of 2) Never done   DEXA SCAN  09/11/2014   MAMMOGRAM  11/13/2014   FOOT EXAM  01/02/2020   COLONOSCOPY (Pts 45-80yrs Insurance coverage will need to be confirmed)  06/01/2022 (Originally [birth year]1992)   HEMOGLOBIN A1C  11/29/2021   OPHTHALMOLOGY EXAM  02/16/2022   URINE MICROALBUMIN  06/01/2022   TETANUS/TDAP  08/29/2022   Pneumonia Vaccine 55+ Years old  Completed   INFLUENZA VACCINE  Completed   COVID-19 Vaccine  Completed   Hepatitis C Screening  Completed   HPV VACCINES  Aged Out    Health Maintenance  Health Maintenance Due  Topic Date Due   Zoster Vaccines- Shingrix (1 of 2) Never done   DEXA SCAN  09/11/2014   MAMMOGRAM  11/13/2014   FOOT EXAM  01/02/2020    Colorectal cancer screening: postponed until 2024  Mammogram status: No longer required due to patient refusal.  B one density status: No longer required due to patient refusal.  Lung Cancer Screening: (Low Dose CT Chest recommended if Age 32-80 years, 30 pack-year currently smoking OR have quit w/in 15years.) does not qualify.   Lung Cancer Screening Referral: no  Additional Screening:  Hepatitis C Screening: does qualify; Completed yes  Vision Screening: Recommended annual ophthalmology exams for early detection of glaucoma and other disorders of the eye. Is the patient up to date with their annual eye exam?  Yes  Who is the provider or what is the name of the office in which  the patient attends annual eye exams? Melissa Noon, OD If pt is not established with a provider, would they like to be referred to a provider to establish care? No .   Dental Screening: Recommended annual dental exams for proper oral hygiene  Community Resource Referral / Chronic Care Management: CRR required this visit?  No   CCM required this visit?  No       Plan:     I have personally reviewed and noted the following in the patients chart:   Medical and social history Use of alcohol, tobacco or illicit drugs  Current medications and supplements including opioid prescriptions.  Functional ability and status Nutritional status Physical activity Advanced directives List of other physicians Hospitalizations, surgeries, and ER visits in previous 12 months Vitals Screenings to include cognitive, depression, and falls Referrals and appointments  In addition, I have reviewed and discussed with patient certain preventive protocols, quality metrics, and best practice recommendations. A written personalized care plan for preventive services as well as general preventive health recommendations were provided to patient.     Sheral Flow, LPN   [id number]/[id number]   Nurse Notes:  Patient is cogitatively intact. There were no vitals filed for this visit. There is no height or weight on file to calculate BMI. Patient stated that she has no issues with gait or balance; does not use any assistive devices. Medications reviewed with patient; no opioid use noted.

## 2021-06-23 ENCOUNTER — Other Ambulatory Visit: Payer: Self-pay

## 2021-06-23 MED ORDER — ROSUVASTATIN CALCIUM 40 MG PO TABS: 40.0000 mg | ORAL_TABLET | Freq: Every day | ORAL | 0 refills | Status: DC

## 2021-07-17 ENCOUNTER — Other Ambulatory Visit: Payer: Self-pay | Admitting: Internal Medicine

## 2021-07-20 ENCOUNTER — Telehealth: Payer: Self-pay

## 2021-07-20 NOTE — Chronic Care Management (AMB) (Signed)
? ? ?Chronic Care Management ?Pharmacy Assistant  ? ?Name: Kathryn Logan  MRN: [id number] DOB: [birth year] ? ?Kathryn Logan is an 76 y.o. year old female who presents for his follow-up CCM visit with the clinical pharmacist. ? ?Reason for Encounter: Disease State-General  ?  ? ?Recent office visits:  ?06/01/21 Binnie Rail, MD-PCP (Diabetes mellitus) Blood work was ordered.  ?Medications changes include :   none ? ?Recent consult visits:  ?None ID ? ?Hospital visits:  ?None in previous 6 months ? ?Medications: ?Outpatient Encounter Medications as of 07/20/2021  ?Medication Sig  ? aspirin EC 81 MG tablet Take 81 mg by mouth daily.   ? BIOTIN PO Take 10,000 mcg by mouth daily.   ? bisacodyl (DULCOLAX) 5 MG EC tablet Take 5 mg by mouth 3 (three) times a week.  ? Calcium Carb-Cholecalciferol (CALCIUM + VITAMIN D3 PO) Take 600 mg by mouth. 600 mg- 25 mcg twice daily  ? carvedilol (COREG) 3.125 MG tablet TAKE 1 TABLET(3.125 MG) BY MOUTH TWICE DAILY  ? Cholecalciferol (VITAMIN D3) 50 MCG (2000 UT) TABS Take by mouth.  ? dapagliflozin propanediol (FARXIGA) 10 MG TABS tablet Take 1 tablet (10 mg total) by mouth daily before breakfast.  ? famotidine (PEPCID) 20 MG tablet TAKE 1 TABLET BY MOUTH TWICE DAILY  ? glimepiride (AMARYL) 2 MG tablet TAKE 1 TABLET BY MOUTH DAILY WITH BREAKFAST  ? glimepiride (AMARYL) 4 MG tablet Take 1 tablet (4 mg total) by mouth daily before breakfast.  ? glucose blood (ONE TOUCH ULTRA TEST) test strip Check blood sugar daily as directed, DX 250.02  ? loratadine (CLARITIN) 10 MG tablet Take 1 tablet (10 mg total) by mouth daily.  ? metFORMIN (GLUCOPHAGE) 500 MG tablet Take 2 tablets (1,000 mg total) by mouth 2 (two) times daily with a meal.  ? ONETOUCH DELICA LANCETS FINE MISC 1 each by Does not apply route as directed. Check blood sugar daily as directed, DX 250.02 ?HOLD UNTIL PATIENT REQUEST  ? pantoprazole (PROTONIX) 40 MG tablet TAKE 1 TABLET BY MOUTH DAILY (Patient not taking: Reported on  06/01/2021)  ? rosuvastatin (CRESTOR) 40 MG tablet Take 1 tablet (40 mg total) by mouth daily. PATIENT NEED TO SCHEDULE APPOINTMENT FOR FUTURE REFILLS, 2nd attempt  ? senna-docusate (SENOKOT-S) 8.6-50 MG tablet Take 1 tablet by mouth at bedtime.  ? sertraline (ZOLOFT) 50 MG tablet TAKE 1 TABLET(50 MG) BY MOUTH DAILY  ? vitamin B-12 (CYANOCOBALAMIN) 1000 MCG tablet Take 1,000 mcg by mouth daily.  ? ?No facility-administered encounter medications on file as of 07/20/2021.  ? ?Have you had any problems recently with your health?Patient states that she does not have any new health issues or changes in her medications ? ?Have you had any problems with your pharmacy?Patient states that she does not have any issues with getting medications or the cost of medications from the Pharmacy. She gets her Wilder Glade from the manufacturer. ? ?What issues or side effects are you having with your medications? Patient states that she does not have any side effects to medications ? ?What would you like me to pass along to [facility] Pharmacist, for them to help you with? Patient states that she has been taking B-12 tablets for a long time her husband as well. She states that now he has to have the B-12 injection every month because his body was no longer absorbing the tablet. She wants to know based on her blood work should she be getting the injection  as well. Patient also stated tat she read an article on baby aspirin that said baby aspirin are not helpful. She wants to know if she should be taking or does she need to ask her cardiologist. ? ?What can we do to take care of you better? Patient states that she does not need anything at this time ? ?Care Gaps: ?Colonoscopy-NA ?Diabetic Foot Exam-01/02/19 ?Mammogram-11/12/12 ?Ophthalmology-02/16/21 ?Dexa Scan - NA ?Annual Well Visit - NA ?Micro albumin-06/01/21 ?Hemoglobin A1c- 06/01/21 ? ?Star Rating Drugs: ?Rosuvastatin 40 mg-last fill 06/23/21 15 ds ?Metformin 500 mg-last fill 127/22 90  ds ? ?Ethelene Hal ?Clinical Pharmacist Assistant ?[phone]  ?

## 2021-08-03 ENCOUNTER — Other Ambulatory Visit: Payer: Self-pay | Admitting: Internal Medicine

## 2021-08-04 DIAGNOSIS — Z20822 Contact with and (suspected) exposure to covid-19: Secondary | ICD-10-CM | POA: Diagnosis not present

## 2021-08-06 ENCOUNTER — Other Ambulatory Visit: Payer: Self-pay | Admitting: Internal Medicine

## 2021-08-30 ENCOUNTER — Ambulatory Visit: Payer: Medicare Other | Admitting: Internal Medicine

## 2021-08-31 ENCOUNTER — Other Ambulatory Visit: Payer: Self-pay | Admitting: Internal Medicine

## 2021-09-05 DIAGNOSIS — Z20822 Contact with and (suspected) exposure to covid-19: Secondary | ICD-10-CM | POA: Diagnosis not present

## 2021-09-06 ENCOUNTER — Ambulatory Visit: Payer: Medicare Other | Admitting: Internal Medicine

## 2021-09-17 DIAGNOSIS — Z20822 Contact with and (suspected) exposure to covid-19: Secondary | ICD-10-CM | POA: Diagnosis not present

## 2021-10-04 ENCOUNTER — Telehealth: Payer: Self-pay

## 2021-10-04 DIAGNOSIS — E1159 Type 2 diabetes mellitus with other circulatory complications: Secondary | ICD-10-CM

## 2021-10-04 NOTE — Telephone Encounter (Signed)
Pt is needing an update Rx for dapagliflozin propanediol (FARXIGA) 10 MG TABS tablet  Fax [phone]  Need rush the order as she will be out in a week

## 2021-10-05 ENCOUNTER — Other Ambulatory Visit: Payer: Self-pay

## 2021-10-05 DIAGNOSIS — E1159 Type 2 diabetes mellitus with other circulatory complications: Secondary | ICD-10-CM

## 2021-10-05 MED ORDER — DAPAGLIFLOZIN PROPANEDIOL 10 MG PO TABS: 10.0000 mg | ORAL_TABLET | Freq: Every day | ORAL | 0 refills | Status: DC

## 2021-10-05 MED ORDER — DAPAGLIFLOZIN PROPANEDIOL 10 MG PO TABS: 10.0000 mg | ORAL_TABLET | Freq: Every day | ORAL | 2 refills | Status: DC

## 2021-10-05 NOTE — Telephone Encounter (Signed)
Faxed in today. 

## 2021-10-06 ENCOUNTER — Telehealth: Payer: Medicare Other

## 2021-10-06 NOTE — Progress Notes (Unsigned)
Chronic Care Management Pharmacy Note  CP 101mns ***  10/06/2021 Name:  Kathryn BILOTTAMRN:  [id number]:  1December 24, 1947 Subjective: Kathryn KIZZIAHis an 76y.o. year old female who is a primary patient of Burns, SClaudina Lick MD.  The CCM team was consulted for assistance with disease management and care coordination needs.    Engaged with patient by telephone for follow up visit in response to provider referral for pharmacy case management and/or care coordination services.   Consent to Services:  The patient was given the following information about Chronic Care Management services today, agreed to services, and gave verbal consent: 1. CCM service includes personalized support from designated clinical staff supervised by the primary care provider, including individualized plan of care and coordination with other care providers 2. 24/7 contact phone numbers for assistance for urgent and routine care needs. 3. Service will only be billed when office clinical staff spend 20 minutes or more in a month to coordinate care. 4. Only one practitioner may furnish and bill the service in a calendar month. 5.The patient may stop CCM services at any time (effective at the end of the month) by phone call to the office staff. 6. The patient will be responsible for cost sharing (co-pay) of up to 20% of the service fee (after annual deductible is met). Patient agreed to services and consent obtained.  Patient Care Team: BBinnie Rail MD as PCP - General (Internal Medicine) SConsuelo Pandy PA-C as Physician Assistant (Cardiology) HPixie Casino MD as Consulting Physician (Cardiology) STomasa Blase [facility](Pharmacist) GMelissa Noon OD as Referring Physician (Optometry)  Recent office visits: 06/01/2021 - Dr. BQuay Burow- stop rybelsus - pt did not tolerate - glimepiride 419mdaily increase metformin 100038mID - f/u in 3 months   Recent consult visits: None  Hospital visits: None in previous 6  months  Objective:  Lab Results  Component Value Date   CREATININE 1.12 06/01/2021   BUN 26 (H) 06/01/2021   GFR 48.59 (L) 06/01/2021   GFRNONAA 37 (L) 11/23/2017   GFRAA 43 (L) 11/23/2017   NA 141 06/01/2021   K 4.5 06/01/2021   CALCIUM 9.8 06/01/2021   CO2 22 06/01/2021    Lab Results  Component Value Date/Time   HGBA1C 8.6 (H) 06/01/2021 11:50 AM   HGBA1C 9.8 (H) 11/10/2020 09:35 AM   GFR 48.59 (L) 06/01/2021 11:50 AM   GFR 48.78 (L) 11/10/2020 09:35 AM   MICROALBUR 17.8 (H) 06/01/2021 11:50 AM   MICROALBUR 3.5 (H) 07/23/2019 07:47 AM    Last diabetic Eye exam:  Lab Results  Component Value Date/Time   HMDIABEYEEXA No Retinopathy 02/16/2021 12:00 AM    Last diabetic Foot exam: No results found for: HMDIABFOOTEX   Lab Results  Component Value Date   CHOL 104 06/01/2021   HDL 32.10 (L) 06/01/2021   LDLCALC 40 06/01/2021   LDLDIRECT 53.0 11/10/2020   TRIG 162.0 (H) 06/01/2021   CHOLHDL 3 06/01/2021       Latest Ref Rng & Units 06/01/2021   11:50 AM 11/10/2020    9:35 AM 03/11/2020    9:13 AM  Hepatic Function  Total Protein 6.0 - 8.3 g/dL 7.2   7.0   6.9    Albumin 3.5 - 5.2 g/dL 4.3   4.2   4.2    AST 0 - 37 U/L _0 ALT 0 - 35 U/L 9  9   9    Alk Phosphatase 39 - 117 U/L 67   76   77    Total Bilirubin 0.2 - 1.2 mg/dL 0.6   0.5   0.5      Lab Results  Component Value Date/Time   TSH 2.20 06/01/2021 11:50 AM   TSH 3.67 11/10/2020 09:35 AM   FREET4 1.09 11/27/2013 12:57 AM       Latest Ref Rng & Units 03/11/2020    9:13 AM 07/23/2019    7:47 AM 01/01/2019    8:48 AM  CBC  WBC 4.0 - 10.5 K/uL 10.1   9.4   8.9    Hemoglobin 12.0 - 15.0 g/dL 12.3   11.6   12.0    Hematocrit 36.0 - 46.0 % 37.7   34.5   35.2    Platelets 150.0 - 400.0 K/uL 256.0   267.0   286.0      Lab Results  Component Value Date/Time   VD25OH 67.47 04/25/2018 09:59 AM   VD25OH 17.53 (L) 04/14/2015 03:04 PM    Clinical ASCVD: Yes  The ASCVD Risk score (Arnett  DK, et al., 2019) failed to calculate for the following reasons:   The patient has a prior MI or stroke diagnosis       06/09/2021   10:13 AM 11/10/2020    9:53 AM 09/24/2019    8:31 AM  Depression screen PHQ 2/9  Decreased Interest 0 0 0  Down, Depressed, Hopeless 0 0 0  PHQ - 2 Score 0 0 0    Social History   Tobacco Use  Smoking Status Never  Smokeless Tobacco Never   BP Readings from Last 3 Encounters:  06/01/21 104/60  11/10/20 110/70  03/11/20 108/72   Pulse Readings from Last 3 Encounters:  06/01/21 80  11/10/20 99  03/11/20 80   Wt Readings from Last 3 Encounters:  06/01/21 123 lb 3.2 oz (55.9 kg)  11/10/20 124 lb (56.2 kg)  03/11/20 127 lb (57.6 kg)   BMI Readings from Last 3 Encounters:  06/01/21 22.53 kg/m  11/10/20 22.68 kg/m  03/11/20 23.23 kg/m    Assessment/Interventions: Review of patient past medical history, allergies, medications, health status, including review of consultants reports, laboratory and other test data, was performed as part of comprehensive evaluation and provision of chronic care management services.   SDOH:  (Social Determinants of Health) assessments and interventions performed: Yes    CCM Care Plan  No Known Allergies  Medications Reviewed Today     Reviewed by Sheral Flow, LPN (Licensed Practical Nurse) on 06/09/21 at 1027  Med List Status: <None>   Medication Order Taking? Sig Documenting Provider Last Dose Status Informant  aspirin EC 81 MG tablet [id number] No Take 81 mg by mouth daily.  [provider] Taking Active Self  BIOTIN PO [id number] No Take 10,000 mcg by mouth daily.  [provider] Taking Active Self  bisacodyl (DULCOLAX) 5 MG EC tablet [id number] No Take 5 mg by mouth 3 (three) times a week. [provider] Taking Active Self  Calcium Carb-Cholecalciferol (CALCIUM + VITAMIN D3 PO) [id number] No Take 600 mg by mouth. 600 mg- 25 mcg twice daily [provider] Taking  Active   carvedilol (COREG) 3.125 MG tablet [id number] No TAKE 1 TABLET(3.125 MG) BY MOUTH TWICE DAILY Hilty, Nadean Corwin, MD Taking Active   Cholecalciferol (VITAMIN D3) 50 MCG (2000 UT) TABS [id number] No Take by mouth. [provider] Taking Active  dapagliflozin propanediol (FARXIGA) 10 MG TABS tablet [id number] No Take 1 tablet (10 mg total) by mouth daily before breakfast. Binnie Rail, MD Taking Active   famotidine (PEPCID) 20 MG tablet [id number] No TAKE 1 TABLET BY MOUTH TWICE DAILY Burns, Claudina Lick, MD Taking Active   glimepiride (AMARYL) 4 MG tablet [id number]  Take 1 tablet (4 mg total) by mouth daily before breakfast. Binnie Rail, MD  Active   glucose blood (ONE TOUCH ULTRA TEST) test strip [id number] No Check blood sugar daily as directed, DX 250.02 Hendricks Limes, MD Taking Active Self  loratadine (CLARITIN) 10 MG tablet [id number] No Take 1 tablet (10 mg total) by mouth daily. Binnie Rail, MD Taking Active Self  metFORMIN (GLUCOPHAGE) 500 MG tablet [id number]  Take 2 tablets (1,000 mg total) by mouth 2 (two) times daily with a meal. Binnie Rail, MD  Active   [place] [id number] No 1 each by Does not apply route as directed. Check blood sugar daily as directed, DX 250.02 HOLD UNTIL PATIENT REQUEST Hendricks Limes, MD Taking Active Self  pantoprazole (PROTONIX) 40 MG tablet [id number] No TAKE 1 TABLET BY MOUTH DAILY  Patient not taking: Reported on 06/01/2021   Binnie Rail, MD Not Taking Active   rosuvastatin (CRESTOR) 40 MG tablet [id number] No Take 1 tablet (40 mg total) by mouth daily. PATIENT NEED TO SCHEDULE APPOINTMENT FOR FUTURE REFILLS Hilty, Nadean Corwin, MD Taking Active   senna-docusate (SENOKOT-S) 8.6-50 MG tablet [id number] No Take 1 tablet by mouth at bedtime. Patrecia Pour, MD Taking Active   sertraline (ZOLOFT) 50 MG tablet [id number] No TAKE 1 TABLET(50 MG) BY MOUTH DAILY Burns, Claudina Lick, MD Taking Active   vitamin B-12 (CYANOCOBALAMIN)  1000 MCG tablet [id number] No Take 1,000 mcg by mouth daily. [provider] Taking Active Self            Patient Active Problem List   Diagnosis Date Noted   CKD (chronic kidney disease) stage 3, GFR 30-59 ml/min ([place]) 11/10/2020   Aortic atherosclerosis ([place]) 11/09/2020   Subclinical hypothyroidism 11/09/2020   Anemia 11/26/2017   Syncope 11/20/2017   Compression fracture of L1 lumbar vertebra, closed, initial encounter ([place]) 11/20/2017   Vitamin D deficiency 02/22/2016   Anxiety 02/22/2016   CAD (coronary artery disease) 02/21/2016   GERD (gastroesophageal reflux disease) 04/14/2015   S/P CABG x 3 12/01/2013   NSTEMI (non-ST elevated myocardial infarction) ([place]) 11/26/2013   Type 2 diabetes mellitus with vascular disease ([place]) 08/28/2012   Hyperlipidemia 12/18/2006   Osteopenia 10/09/2006    Immunization History  Administered Date(s) Administered   Fluad Quad(high Dose 65+) 01/22/2019, 03/11/2020   Influenza, High Dose Seasonal PF 02/22/2016, 03/14/2017, 04/25/2018   Influenza,inj,Quad PF,6+ Mos 03/03/2014, 04/14/2015   Influenza-Unspecified 02/16/2017, 04/01/2021   PFIZER(Purple Top)SARS-COV-2 Vaccination 06/19/2019, 07/16/2019, 04/13/2020   Pfizer Covid-19 Vaccine Bivalent Booster 43yr & up 04/01/2021   Pneumococcal Conjugate-13 04/13/2017   Pneumococcal Polysaccharide-23 08/29/2012   Tdap 08/28/2012    Conditions to be addressed/monitored:  Hypertension, Hyperlipidemia, Diabetes and Coronary Artery Disease  There are no care plans that you recently modified to display for this patient.   [facility] [id number] [address]Phone: [phone]: [address]  [place] [address] Phone: [phone] Fax: [phone]  Uses pill box?  Yes Pt endorses 100% compliance  Care Plan and Follow Up Patient Decision:  Patient agrees to Care Plan and Follow-up.  Plan: Telephone follow up appointment with care management team member scheduled for:  6 months  ***

## 2021-10-11 ENCOUNTER — Telehealth: Payer: Self-pay

## 2021-10-11 NOTE — Telephone Encounter (Signed)
Requesting a call back when you are available.   Please advise

## 2021-10-11 NOTE — Telephone Encounter (Signed)
Patient states they have not received fax. She is requesting that RX be called in to [facility] at [phone].  She is also requesting samples as she only has a week of medication left. Patient's call back number is [phone].

## 2021-10-11 NOTE — Telephone Encounter (Signed)
Spoke with patient,   Rescheduled CCM telephone appointment   Tomasa Blase, PharmD Clinical Pharmacist, [place]

## 2021-10-11 NOTE — Telephone Encounter (Signed)
NEW NOTE NOT NEEDED °

## 2021-10-11 NOTE — Telephone Encounter (Signed)
Pt is requesting samples for the Farxiga '10mg'$  until she is able to get the Rx.  Please advise

## 2021-10-12 ENCOUNTER — Other Ambulatory Visit: Payer: Self-pay

## 2021-10-12 DIAGNOSIS — E1159 Type 2 diabetes mellitus with other circulatory complications: Secondary | ICD-10-CM

## 2021-10-13 ENCOUNTER — Other Ambulatory Visit: Payer: Self-pay | Admitting: Internal Medicine

## 2021-10-13 NOTE — Telephone Encounter (Signed)
Samples left up front for patient to pick up.  Will call script in today

## 2021-10-13 NOTE — Telephone Encounter (Signed)
Script sent in today and spoke with Ebony Hail in the pharmacy.

## 2021-11-04 ENCOUNTER — Other Ambulatory Visit: Payer: Self-pay | Admitting: Internal Medicine

## 2021-12-08 ENCOUNTER — Other Ambulatory Visit: Payer: Self-pay | Admitting: Internal Medicine

## 2021-12-09 ENCOUNTER — Other Ambulatory Visit: Payer: Self-pay | Admitting: Internal Medicine

## 2021-12-19 ENCOUNTER — Telehealth: Payer: Self-pay | Admitting: Internal Medicine

## 2021-12-19 NOTE — Telephone Encounter (Signed)
*  STAT* If patient is at the pharmacy, call can be transferred to refill team.   1. Which medications need to be refilled? (please list name of each medication and dose if known) rosuvastatin (CRESTOR) 40 MG tablet  2. Which pharmacy/location (including street and city if local pharmacy) is medication to be sent to? [facility] [place], [place]  3. Do they need a 30 day or 90 day supply? [place]

## 2021-12-20 MED ORDER — ROSUVASTATIN CALCIUM 40 MG PO TABS: ORAL_TABLET | ORAL | 0 refills | Status: DC

## 2021-12-22 ENCOUNTER — Telehealth: Payer: Self-pay | Admitting: Internal Medicine

## 2021-12-22 NOTE — Telephone Encounter (Signed)
Spoke with patient today. She was calling regarding patient assistance for [place].  Called [employer] and was able to assist patient with shipping and number provided for patient to call and check tracking.

## 2021-12-22 NOTE — Telephone Encounter (Signed)
Patient has not received her farxiga - please give her a call - MRN [id number]

## 2022-01-09 ENCOUNTER — Other Ambulatory Visit: Payer: Self-pay | Admitting: Internal Medicine

## 2022-01-19 ENCOUNTER — Telehealth: Payer: Medicare Other

## 2022-01-20 ENCOUNTER — Other Ambulatory Visit: Payer: Self-pay | Admitting: Internal Medicine

## 2022-01-20 ENCOUNTER — Telehealth: Payer: Medicare Other

## 2022-02-27 ENCOUNTER — Ambulatory Visit: Payer: Medicare Other

## 2022-03-27 ENCOUNTER — Other Ambulatory Visit: Payer: Self-pay | Admitting: Internal Medicine

## 2022-04-09 NOTE — Patient Instructions (Addendum)
     Flu immunization administered today.     Consider the covid booster, RSV vaccine and shingrix vaccine at the pharmacy.   Blood work was ordered.   The lab is on the first floor.      Medications changes include :   stop sertraline.  Start lexapro 10 mg daily.  We can increase in the future if needed.      Return in about 6 months (around 10/10/2022) for follow up.

## 2022-04-09 NOTE — Progress Notes (Signed)
Subjective:    Patient ID: Kathryn Logan, female    DOB: [birth year], 76 y.o.   MRN: [id number]     HPI Kathryn Logan is here for follow up of her chronic medical problems, including DM, CAD, hld, CKD, gerd, anxiety, osteopenia, subclinical hypothyroidism, vit d def  She can not get out of bed, can not finish a project, can not put make up on and does not have a desire to put jewelry on.   Her husband feels she is forgetful.  She is very busy caring for him.  She feels like she just drops her keys and stuff when she comes in the house - she is exhausted.  Then she can not remember where she left things.  She is irritable, stressed all the time. She avoid situations and certain things.  She does not feel like she has a memory issue-she knows most of the time she is not paying attention and is busy running around her exhausted and that is what is contributing.   Medications and allergies reviewed with patient and updated if appropriate.  Current Outpatient Medications on File Prior to Visit  Medication Sig Dispense Refill   aspirin EC 81 MG tablet Take 81 mg by mouth daily.      BIOTIN PO Take 10,000 mcg by mouth daily.      bisacodyl (DULCOLAX) 5 MG EC tablet Take 5 mg by mouth 3 (three) times a week.     Calcium Carb-Cholecalciferol (CALCIUM + VITAMIN D3 PO) Take 600 mg by mouth. 600 mg- 25 mcg twice daily     carvedilol (COREG) 3.125 MG tablet TAKE 1 TABLET(3.125 MG) BY MOUTH TWICE DAILY 180 tablet 0   Cholecalciferol (VITAMIN D3) 50 MCG (2000 UT) TABS Take by mouth.     dapagliflozin propanediol (FARXIGA) 10 MG TABS tablet Take 1 tablet (10 mg total) by mouth daily before breakfast. Keep falling appt for future refills 90 tablet 2   famotidine (PEPCID) 20 MG tablet TAKE 1 TABLET BY MOUTH TWICE DAILY 180 tablet 1   glucose blood (ONE TOUCH ULTRA TEST) test strip Check blood sugar daily as directed, DX 250.02 100 each 12   loratadine (CLARITIN) 10 MG tablet Take 1 tablet (10 mg total)  by mouth daily. 90 tablet 3   metFORMIN (GLUCOPHAGE) 500 MG tablet Take 2 tablets (1,000 mg total) by mouth 2 (two) times daily with a meal. 360 tablet 1   ONETOUCH DELICA LANCETS FINE MISC 1 each by Does not apply route as directed. Check blood sugar daily as directed, DX 250.02 HOLD UNTIL PATIENT REQUEST 100 each 5   pantoprazole (PROTONIX) 40 MG tablet TAKE 1 TABLET BY MOUTH DAILY 90 tablet 1   rosuvastatin (CRESTOR) 40 MG tablet TAKE 1 TABLET BY MOUTH DAILY. KEEP UPCOMING APPOINTMENT FOR FUTURE REFILLS 30 tablet 0   senna-docusate (SENOKOT-S) 8.6-50 MG tablet Take 1 tablet by mouth at bedtime. 30 tablet 0   vitamin B-12 (CYANOCOBALAMIN) 1000 MCG tablet Take 1,000 mcg by mouth daily.     No current facility-administered medications on file prior to visit.     Review of Systems  Constitutional:  Negative for fever.  Respiratory:  Negative for cough, shortness of breath and wheezing.   Cardiovascular:  Negative for chest pain, palpitations and leg swelling.  Gastrointestinal:        Occ gerd  Neurological:  Negative for light-headedness and headaches.       Objective:   Vitals:  04/11/22 1059  BP: 126/80  Pulse: 98  Temp: 98.1 F (36.7 C)  SpO2: 98%   BP Readings from Last 3 Encounters:  04/11/22 126/80  06/01/21 104/60  11/10/20 110/70   Wt Readings from Last 3 Encounters:  04/11/22 123 lb 6.4 oz (56 kg)  06/01/21 123 lb 3.2 oz (55.9 kg)  11/10/20 124 lb (56.2 kg)   Body mass index is 22.57 kg/m.    Physical Exam Constitutional:      General: She is not in acute distress.    Appearance: Normal appearance.  HENT:     Head: Normocephalic and atraumatic.  Eyes:     Conjunctiva/sclera: Conjunctivae normal.  Cardiovascular:     Rate and Rhythm: Normal rate and regular rhythm.     Heart sounds: Normal heart sounds. No murmur heard. Pulmonary:     Effort: Pulmonary effort is normal. No respiratory distress.     Breath sounds: Normal breath sounds. No wheezing.   Musculoskeletal:     Cervical back: Neck supple.     Right lower leg: No edema.     Left lower leg: No edema.  Lymphadenopathy:     Cervical: No cervical adenopathy.  Skin:    General: Skin is warm and dry.     Findings: No rash.  Neurological:     Mental Status: She is alert. Mental status is at baseline.  Psychiatric:        Mood and Affect: Mood normal.        Behavior: Behavior normal.        Lab Results  Component Value Date   WBC 10.1 03/11/2020   HGB 12.3 03/11/2020   HCT 37.7 03/11/2020   PLT 256.0 03/11/2020   GLUCOSE 193 (H) 06/01/2021   CHOL 104 06/01/2021   TRIG 162.0 (H) 06/01/2021   HDL 32.10 (L) 06/01/2021   LDLDIRECT 53.0 11/10/2020   LDLCALC 40 06/01/2021   ALT 9 06/01/2021   AST 13 06/01/2021   NA 141 06/01/2021   K 4.5 06/01/2021   CL 109 06/01/2021   CREATININE 1.12 06/01/2021   BUN 26 (H) 06/01/2021   CO2 22 06/01/2021   TSH 2.20 06/01/2021   INR 1.38 12/01/2013   HGBA1C 8.6 (H) 06/01/2021   MICROALBUR 17.8 (H) 06/01/2021     Assessment & Plan:   I do not think she has memory issue and neither does she.  I do think she is probably depressed, anxious and very busy caring for her husband which are likely contributing to some of her symptoms.  Her and her husband are sleeping during the day and that most of the night.  They eat out because she does not cook and he is not able to fix any meals.  Discussed changing lifestyle for more healthy lifestyle-sleeping at night and not during the day mom and thermic changes See Problem List for Assessment and Plan of chronic medical problems.

## 2022-04-10 ENCOUNTER — Encounter: Payer: Self-pay | Admitting: Internal Medicine

## 2022-04-11 ENCOUNTER — Ambulatory Visit (INDEPENDENT_AMBULATORY_CARE_PROVIDER_SITE_OTHER): Payer: Medicare Other | Admitting: Internal Medicine

## 2022-04-11 ENCOUNTER — Ambulatory Visit: Payer: Medicare Other | Attending: Internal Medicine | Admitting: Internal Medicine

## 2022-04-11 ENCOUNTER — Encounter: Payer: Self-pay | Admitting: Internal Medicine

## 2022-04-11 VITALS — BP 148/82 | HR 111 | Ht 63.0 in | Wt 123.8 lb

## 2022-04-11 VITALS — BP 126/80 | HR 98 | Temp 98.1°F | Ht 62.0 in | Wt 123.4 lb

## 2022-04-11 DIAGNOSIS — E559 Vitamin D deficiency, unspecified: Secondary | ICD-10-CM

## 2022-04-11 DIAGNOSIS — E1159 Type 2 diabetes mellitus with other circulatory complications: Secondary | ICD-10-CM

## 2022-04-11 DIAGNOSIS — F419 Anxiety disorder, unspecified: Secondary | ICD-10-CM | POA: Diagnosis not present

## 2022-04-11 DIAGNOSIS — M85869 Other specified disorders of bone density and structure, unspecified lower leg: Secondary | ICD-10-CM

## 2022-04-11 DIAGNOSIS — I25119 Atherosclerotic heart disease of native coronary artery with unspecified angina pectoris: Secondary | ICD-10-CM

## 2022-04-11 DIAGNOSIS — E782 Mixed hyperlipidemia: Secondary | ICD-10-CM | POA: Insufficient documentation

## 2022-04-11 DIAGNOSIS — E038 Other specified hypothyroidism: Secondary | ICD-10-CM

## 2022-04-11 DIAGNOSIS — I2510 Atherosclerotic heart disease of native coronary artery without angina pectoris: Secondary | ICD-10-CM | POA: Diagnosis not present

## 2022-04-11 DIAGNOSIS — I700 Atherosclerosis of aorta: Secondary | ICD-10-CM | POA: Diagnosis not present

## 2022-04-11 DIAGNOSIS — R002 Palpitations: Secondary | ICD-10-CM | POA: Diagnosis not present

## 2022-04-11 DIAGNOSIS — N1831 Chronic kidney disease, stage 3a: Secondary | ICD-10-CM | POA: Diagnosis not present

## 2022-04-11 DIAGNOSIS — F32A Depression, unspecified: Secondary | ICD-10-CM

## 2022-04-11 DIAGNOSIS — Z23 Encounter for immunization: Secondary | ICD-10-CM

## 2022-04-11 DIAGNOSIS — K219 Gastro-esophageal reflux disease without esophagitis: Secondary | ICD-10-CM | POA: Diagnosis not present

## 2022-04-11 MED ORDER — ESCITALOPRAM OXALATE 10 MG PO TABS: 10.0000 mg | ORAL_TABLET | Freq: Every day | ORAL | 1 refills | Status: DC

## 2022-04-11 MED ORDER — GLIMEPIRIDE 4 MG PO TABS: 4.0000 mg | ORAL_TABLET | Freq: Every day | ORAL | 2 refills | Status: DC

## 2022-04-11 NOTE — Assessment & Plan Note (Signed)
Chronic Continue Pepcid 20 mg twice daily-for the most part this is controlling her GERD On occasion she will have some reflux in the middle of the night when she needs in the middle of the night Stressed changing her lifestyle Okay to take Tums as needed Not currently taking pantoprazole and discussed possible side effects-best to avoid if she can, but okay to take for short-term or as needed

## 2022-04-11 NOTE — Addendum Note (Signed)
Addended by: Marcina Millard on: 04/11/2022 03:47 PM   Modules accepted: Orders

## 2022-04-11 NOTE — Assessment & Plan Note (Signed)
Chronic Regular exercise and healthy diet encouraged Check lipid panel  Continue rosuvastatin 40 mg daily 

## 2022-04-11 NOTE — Assessment & Plan Note (Addendum)
Chronic Check vitamin D level 

## 2022-04-11 NOTE — Assessment & Plan Note (Signed)
Chronic Denies any symptoms consistent with angina New aspirin 81 mg daily, carvedilol 3.125 mg twice daily, Crestor 40 mg daily

## 2022-04-11 NOTE — Patient Instructions (Signed)
Medication Instructions:  NO CHANGES  *If you need a refill on your cardiac medications before your next appointment, please call your pharmacy*    Follow-Up: At [facility], you and your health needs are our priority.  As part of our continuing mission to provide you with exceptional heart care, we have created designated Provider Care Teams.  These Care Teams include your primary Cardiologist (physician) and Advanced Practice Providers (APPs -  Physician Assistants and Nurse Practitioners) who all work together to provide you with the care you need, when you need it.  We recommend signing up for the patient portal called "MyChart".  Sign up information is provided on this After Visit Summary.  MyChart is used to connect with patients for Virtual Visits (Telemedicine).  Patients are able to view lab/test results, encounter notes, upcoming appointments, etc.  Non-urgent messages can be sent to your provider as well.   To learn more about what you can do with MyChart, go to [email or web address].    Your next appointment:   12 month(s)  The format for your next appointment:   In Person  Provider:   Lyman Bishop MD

## 2022-04-11 NOTE — Assessment & Plan Note (Signed)
Chronic   Lab Results  Component Value Date   HGBA1C 8.6 (H) 06/01/2021   Sugars not controlled Check A1c, urine microalbumin today Continue metformin 1000 mg twice daily, glimepiride 4 mg daily, Farxiga 10 mg daily Stressed regular exercise, diabetic diet

## 2022-04-11 NOTE — Assessment & Plan Note (Signed)
Chronic Continue Crestor 40 mg daily

## 2022-04-11 NOTE — Progress Notes (Addendum)
OFFICE NOTE  Chief Complaint:  No complaints  Primary Care Physician: Binnie Rail, MD  HPI:  Kathryn Logan is a 76 year old female with a h/o diet controlled DM but no prior cardiac history who presented to [facility] on 11/26/2013 with chest discomfort with associated nausea and diaphoresis. EKG demonstrated nonspecific T-wave abnormalities. Initial cardiac enzymes were elevated and she ruled in for non-ST elevation myocardial infarction. Subsequently, she underwent a left heart catheterization, perform by Dr. Tamala Julian, which revealed severe coronary artery disease involving the proximal LAD, the first and second marginal branches and a nondominant right coronary artery. She was found to have moderate to severe left ventricular systolic dysfunction with an estimated ejection fraction of 30-35%. However, post-cath 2-D echocardiogram revealed an EF of 40-45%. Surgical revascularization was recommended for her CAD. She ultimately underwent CABG x3 by Dr. Servando Snare on 12/01/2013. She received a LIMA-LAD, SVG-OM2, SVG-dRCA. She had an uncomplicated postoperative course. She was discharged home on 12/05/2013. She was discharged home on aspirin, metoprolol, lisinopril and atorvastatin.  Kathryn Logan was recently seen by Ellen Henri, PA-C., who recommended increasing her beta blocker due to tachycardia. Recently she's been having problems with hypotension, mostly in the morning at cardiac rehabilitation. She is generally asymptomatic with this.  I saw Kathryn Logan back in the office today. Overall she is doing really well. She seems to be maintaining a high normal heart rate in the 80s and 90s. Think a lot of this may be due to anxiety. She's had low blood pressure in the past however I moved her lisinopril to take at night and her blood pressures been more stable. She denies any chest pain or worsening shortness of breath. She's not on any exercise over the past month and has a small amount  of weight gain. She signed up for maintenance cardiac redilatation starting in a couple of days. Hopefully this will help her with weight loss and better cardiac conditioning.  Kathryn Logan returns today for follow-up. Overall she seems to be doing well. She denies any chest pain and only had some mild shortness of breath when [place]. She says she can do most activities here without any problems. She continues to have a persistent tachycardia. Heart rate today was close to 120 at rest. She did take her beta blocker and recently that dose had been increased up to 50 mg twice daily of Lopressor. She does not feel fatigued or any different on a higher dose of the medicine and does not perceive any significant change in her heart rate.  06/09/2016  Kathryn Logan was seen today in follow-up. Over the past year she's done very well. She denies any chest pain or worsening shortness of breath. She notes that the change to carvedilol has helped her tachycardia. She is due for some repeat lab work with his which was ordered by Dr. Quay Burow but she has not yet had that drawn.  02/10/2018  Kathryn Logan returns today for follow-up.  In July she saw Rosaria Ferries, PA-C, after a syncopal episode.  Was felt that she may have been dehydrated in combination with a higher dose blood pressure medicine and recent weight loss.  Her medications were stopped and she was rehydrated.  Unfortunately she had a lumbar fracture for which she continues to wear back brace.  Hopefully she will be out of that in a few weeks.  She denies any chest pain or worsening shortness of breath and has had no  further syncopal episodes.  She is now on low-dose carvedilol.  He did wear a monitor which showed no evidence of A. fib, ventricular arrhythmias, pauses or any explanation for syncope rather just very infrequent PACs and PVCs less than 1%.  08/13/2019  Kathryn Logan returns today for follow-up.  More recently her blood sugars  have been poorly controlled.  Hemoglobin A1c was 11.5 and lipids showed total cholesterol of 100, direct LDL 49 with triglycerides of 204.  This is likely related to her uncontrolled blood sugar.  Blood pressure is well controlled at 118/80.  She is on rosuvastatin 40 mg.  EKG shows sinus rhythm.  She denies any chest pain.  She has had no further syncopal episodes.  She notes very infrequent palpitations.  04/11/2022  Kathryn Logan is seen today for follow-up.  She saw her PCP earlier today.  Her blood pressure was normal at the time although is elevated today.  She denies any chest pain or worsening shortness of breath.  She has been under a lot of stress with her husband who has had multiple doctor visits.  She is noted again to be tachycardic today.  She has not had any further syncope.  PMHx:  Past Medical History:  Diagnosis Date   Compression fracture of L1 lumbar vertebra ([place]) 11/21/2017   Diabetes mellitus without complication (HCC)    Gallbladder sludge    GERD (gastroesophageal reflux disease)    Heart disease    Hyperlipidemia    NSTEMI (non-ST elevated myocardial infarction) ([place]) 11/2013   Osteopenia    Syncope 11/2017    Past Surgical History:  Procedure Laterality Date   CORONARY ARTERY BYPASS GRAFT N/A 12/01/2013   Procedure: CORONARY ARTERY BYPASS GRAFTING (CABG) x three, using left internal mammary artery, and right leg saphenous vein harvested endoscopically (LIMA to LAD, SVG to OM2 , SVG to dRCA);  Surgeon: Grace Isaac, MD;  Location: [facility];  Service: Open Heart Surgery;  Laterality: N/A;   G 1 P 1     INTRAOPERATIVE TRANSESOPHAGEAL ECHOCARDIOGRAM N/A 12/01/2013   Procedure: INTRAOPERATIVE TRANSESOPHAGEAL ECHOCARDIOGRAM;  Surgeon: Grace Isaac, MD;  Location: [place] [facility];  Service: Open Heart Surgery;  Laterality: N/A;   LEFT HEART CATHETERIZATION WITH CORONARY ANGIOGRAM N/A 11/27/2013   Procedure: LEFT HEART CATHETERIZATION WITH CORONARY ANGIOGRAM;  Surgeon: Sinclair Grooms, MD;  Location: [facility] CATH LAB;  Service: Cardiovascular;  Laterality: N/A;   no colonoscopy     SOC reviewed   TONSILLECTOMY AND ADENOIDECTOMY      FAMHx:  Family History  Problem Relation Age of Onset   Heart attack Mother 88   Diabetes Mother    Stroke Mother    Hypertension Mother    Goiter Mother    Heart attack Father        >55   Bladder Cancer Father    Parkinson's disease Maternal Aunt    Osteoporosis Sister    Diabetes Brother     SOCHx:   reports that she has never smoked. She has never used smokeless tobacco. She reports current alcohol use. She reports that she does not use drugs.  ALLERGIES:  No Known Allergies  ROS: A comprehensive review of systems was negative.  HOME MEDS: Current Outpatient Medications  Medication Sig Dispense Refill   aspirin EC 81 MG tablet Take 81 mg by mouth daily.      BIOTIN PO Take 10,000 mcg by mouth daily.      bisacodyl (DULCOLAX) 5 MG  EC tablet Take 5 mg by mouth 3 (three) times a week.     Calcium Carb-Cholecalciferol (CALCIUM + VITAMIN D3 PO) Take 600 mg by mouth. 600 mg- 25 mcg twice daily     carvedilol (COREG) 3.125 MG tablet TAKE 1 TABLET(3.125 MG) BY MOUTH TWICE DAILY 180 tablet 0   Cholecalciferol (VITAMIN D3) 50 MCG (2000 UT) TABS Take by mouth.     dapagliflozin propanediol (FARXIGA) 10 MG TABS tablet Take 1 tablet (10 mg total) by mouth daily before breakfast. Keep falling appt for future refills 90 tablet 2   escitalopram (LEXAPRO) 10 MG tablet Take 1 tablet (10 mg total) by mouth daily. 90 tablet 1   famotidine (PEPCID) 20 MG tablet TAKE 1 TABLET BY MOUTH TWICE DAILY 180 tablet 1   glimepiride (AMARYL) 4 MG tablet Take 1 tablet (4 mg total) by mouth daily with breakfast. 90 tablet 2   glucose blood (ONE TOUCH ULTRA TEST) test strip Check blood sugar daily as directed, DX 250.02 100 each 12   loratadine (CLARITIN) 10 MG tablet Take 1 tablet (10 mg total) by mouth daily. 90 tablet 3   metFORMIN (GLUCOPHAGE)  500 MG tablet Take 2 tablets (1,000 mg total) by mouth 2 (two) times daily with a meal. 360 tablet 1   ONETOUCH DELICA LANCETS FINE MISC 1 each by Does not apply route as directed. Check blood sugar daily as directed, DX 250.02 HOLD UNTIL PATIENT REQUEST 100 each 5   rosuvastatin (CRESTOR) 40 MG tablet TAKE 1 TABLET BY MOUTH DAILY. KEEP UPCOMING APPOINTMENT FOR FUTURE REFILLS 30 tablet 0   vitamin B-12 (CYANOCOBALAMIN) 1000 MCG tablet Take 1,000 mcg by mouth daily.     No current facility-administered medications for this visit.    LABS/IMAGING: No results found for this or any previous visit (from the past 48 hour(s)). No results found.  VITALS: BP (!) 148/82   Pulse (!) 111   Ht '5\' 3"'$  (1.6 m)   Wt 123 lb 12.8 oz (56.2 kg)   SpO2 98%   BMI 21.93 kg/m   EXAM: General appearance: alert and no distress Neck: no carotid bruit and no JVD Lungs: clear to auscultation bilaterally Heart: regular rate and rhythm, S1, S2 normal, no murmur, click, rub or gallop Abdomen: soft, non-tender; bowel sounds normal; no masses,  no organomegaly Extremities: extremities normal, atraumatic, no cyanosis or edema Pulses: 2+ and symmetric Skin: Skin color, texture, turgor normal. No rashes or lesions Neurologic: Grossly normal Psych: Anxious  EKG: Sinus tachycardia at 111-personally reviewed   ASSESSMENT: Syncope due to orthostatic hypotension Coronary artery disease status post three-vessel CABG in 2015 (LIMA-LAD, SVG-OM2, SVG-dRCA) DM2-A1c 11.5 Dyslipidemia Inappropriate sinus tachycardia   PLAN: 1.   Kathryn Logan has recently had some issues with stress and anxiety.  She saw her PCP this morning who recommended switching her to escitalopram.  This might be playing a role in her tachycardia.  She denies any chest pain or worsening shortness of breath.  No changes to her medicines today.  Follow-up with me annually or sooner as necessary.  Pixie Casino, MD, [facility], [place] Director of the [facility] Lip[facility]  Cardiovascular [facility] Diplomate of the American Board of Clinical Lipidology Attending Cardiologist  Direct Dial: [phone]  Fax: [phone]  Website:  [email or web address] 04/11/2022, 3:51 PM

## 2022-04-11 NOTE — Assessment & Plan Note (Addendum)
Chronic Has many symptoms of depression and anxiety Stop sertraline 50 mg daily Start lexapro 10 mg daily

## 2022-04-11 NOTE — Assessment & Plan Note (Signed)
Chronic Has had low TSH in the past Clinically euthyroid Check TSH

## 2022-04-11 NOTE — Assessment & Plan Note (Signed)
Chronic Reviewed chronic kidney disease Stressed good blood pressure, sugar control Discussed increased water intake, avoiding NSAIDs, low-sodium diet CMP, CBC

## 2022-05-03 ENCOUNTER — Other Ambulatory Visit: Payer: Self-pay | Admitting: Internal Medicine

## 2022-05-24 ENCOUNTER — Other Ambulatory Visit: Payer: Self-pay | Admitting: Internal Medicine

## 2022-05-24 ENCOUNTER — Ambulatory Visit: Payer: Medicare Other | Admitting: Internal Medicine

## 2022-05-29 ENCOUNTER — Telehealth: Payer: Self-pay | Admitting: Internal Medicine

## 2022-05-29 NOTE — Telephone Encounter (Signed)
Left message for patient to call back to schedule Medicare Annual Wellness Visit   Last AWV  06/09/21  Please schedule at anytime with [facility] if patient calls the office back.    30 Minutes appointment   Any questions, please call me at [phone]

## 2022-06-21 ENCOUNTER — Other Ambulatory Visit: Payer: Self-pay | Admitting: Internal Medicine

## 2022-07-14 ENCOUNTER — Other Ambulatory Visit: Payer: Self-pay

## 2022-07-14 ENCOUNTER — Other Ambulatory Visit: Payer: Self-pay | Admitting: Internal Medicine

## 2022-08-03 ENCOUNTER — Other Ambulatory Visit: Payer: Self-pay | Admitting: Internal Medicine

## 2022-10-02 ENCOUNTER — Ambulatory Visit (INDEPENDENT_AMBULATORY_CARE_PROVIDER_SITE_OTHER): Payer: Medicare Other

## 2022-10-02 DIAGNOSIS — Z0000 Encounter for general adult medical examination without abnormal findings: Secondary | ICD-10-CM

## 2022-10-02 NOTE — Progress Notes (Signed)
Subjective:   Kathryn Logan is a 77 y.o. female who presents for Medicare Annual (Subsequent) preventive examination.  I connected with Cashae today by telephone and verified that I am speaking with the correct person using two identifiers. I discussed the limitations, risks, security and privacy concerns of performing an evaluation and management service by telephone and the availability of in person appointments. I also discussed with the patient that there may be a patient responsible charge related to this service. The patient expressed understanding and agreed to proceed. Location patient: home Location provider: Kyra Searles Persons participating in the visit: Shailynn and Elyse Jarvis, CMA.  Time Spent with patient on telephone encounter: 10 mins  Review of Systems    No ROS. Medicare Wellness Telephone Visit. Additional risk factors are reflected in social history. Cardiac Risk Factors include: advanced age (>76men, >37 women);dyslipidemia     Objective:    There were no vitals filed for this visit. There is no height or weight on file to calculate BMI.     10/02/2022   10:16 AM 06/09/2021   10:08 AM 09/24/2019    8:29 AM 11/21/2017    8:50 AM 11/26/2013   11:00 PM  Advanced Directives  Does Patient Have a Medical Advance Directive? No No No No Patient has advance directive, copy not in chart  Type of Advance Directive     Healthcare Power of Attorney  Does patient want to make changes to medical advance directive?     No change requested  Copy of Healthcare Power of Attorney in Chart?     Copy requested from family  Would patient like information on creating a medical advance directive? Yes (ED - Information included in AVS) No - Patient declined Yes (ED - Information included in AVS) No - Patient declined   Pre-existing out of facility DNR order (yellow form or pink MOST form)     No    Current Medications (verified) Outpatient Encounter Medications as of  10/02/2022  Medication Sig   aspirin EC 81 MG tablet Take 81 mg by mouth daily.    BIOTIN PO Take 10,000 mcg by mouth daily.    bisacodyl (DULCOLAX) 5 MG EC tablet Take 5 mg by mouth 3 (three) times a week.   Calcium Carb-Cholecalciferol (CALCIUM + VITAMIN D3 PO) Take 600 mg by mouth. 600 mg- 25 mcg twice daily   carvedilol (COREG) 3.125 MG tablet TAKE 1 TABLET(3.125 MG) BY MOUTH TWICE DAILY   Cholecalciferol (VITAMIN D3) 50 MCG (2000 UT) TABS Take by mouth.   dapagliflozin propanediol (FARXIGA) 10 MG TABS tablet Take 1 tablet (10 mg total) by mouth daily before breakfast. Keep falling appt for future refills   escitalopram (LEXAPRO) 10 MG tablet Take 1 tablet (10 mg total) by mouth daily.   famotidine (PEPCID) 20 MG tablet TAKE 1 TABLET BY MOUTH TWICE DAILY   glimepiride (AMARYL) 4 MG tablet TAKE 1 TABLET BY MOUTH EVERY MORNING BEFORE BREAKFAST   glucose blood (ONE TOUCH ULTRA TEST) test strip Check blood sugar daily as directed, DX 250.02   loratadine (CLARITIN) 10 MG tablet Take 1 tablet (10 mg total) by mouth daily.   metFORMIN (GLUCOPHAGE) 500 MG tablet TAKE 2 TABLETS(1000 MG) BY MOUTH TWICE DAILY WITH A MEAL   ONETOUCH DELICA LANCETS FINE MISC 1 each by Does not apply route as directed. Check blood sugar daily as directed, DX 250.02 HOLD UNTIL PATIENT REQUEST   rosuvastatin (CRESTOR) 40 MG tablet TAKE 1 TABLET  BY MOUTH EVERY DAY   vitamin B-12 (CYANOCOBALAMIN) 1000 MCG tablet Take 1,000 mcg by mouth daily.   No facility-administered encounter medications on file as of 10/02/2022.    Allergies (verified) Patient has no known allergies.   History: Past Medical History:  Diagnosis Date   Compression fracture of L1 lumbar vertebra (HCC) 11/21/2017   Diabetes mellitus without complication (HCC)    Gallbladder sludge    GERD (gastroesophageal reflux disease)    Heart disease    Hyperlipidemia    NSTEMI (non-ST elevated myocardial infarction) (HCC) 11/2013   Osteopenia    Syncope  11/2017   Past Surgical History:  Procedure Laterality Date   CORONARY ARTERY BYPASS GRAFT N/A 12/01/2013   Procedure: CORONARY ARTERY BYPASS GRAFTING (CABG) x three, using left internal mammary artery, and right leg saphenous vein harvested endoscopically (LIMA to LAD, SVG to OM2 , SVG to dRCA);  Surgeon: Delight Ovens, MD;  Location: [facility] OR;  Service: Open Heart Surgery;  Laterality: N/A;   G 1 P 1     INTRAOPERATIVE TRANSESOPHAGEAL ECHOCARDIOGRAM N/A 12/01/2013   Procedure: INTRAOPERATIVE TRANSESOPHAGEAL ECHOCARDIOGRAM;  Surgeon: Delight Ovens, MD;  Location: [facility] OR;  Service: Open Heart Surgery;  Laterality: N/A;   LEFT HEART CATHETERIZATION WITH CORONARY ANGIOGRAM N/A 11/27/2013   Procedure: LEFT HEART CATHETERIZATION WITH CORONARY ANGIOGRAM;  Surgeon: Lesleigh Noe, MD;  Location: [facility] CATH LAB;  Service: Cardiovascular;  Laterality: N/A;   no colonoscopy     SOC reviewed   TONSILLECTOMY AND ADENOIDECTOMY     Family History  Problem Relation Age of Onset   Heart attack Mother 34   Diabetes Mother    Stroke Mother    Hypertension Mother    Goiter Mother    Heart attack Father        >55   Bladder Cancer Father    Parkinson's disease Maternal Aunt    Osteoporosis Sister    Diabetes Brother    Social History   Socioeconomic History   Marital status: Married    Spouse name: Not on file   Number of children: Not on file   Years of education: Not on file   Highest education level: Not on file  Occupational History   Not on file  Tobacco Use   Smoking status: Never   Smokeless tobacco: Never  Vaping Use   Vaping Use: Never used  Substance and Sexual Activity   Alcohol use: Yes    Comment: Occasionally    Drug use: No   Sexual activity: Not on file  Other Topics Concern   Not on file  Social History Narrative   Not on file   Social Determinants of Health   Financial Resource Strain: Low Risk  (10/02/2022)   Overall Financial Resource Strain (CARDIA)     Difficulty of Paying Living Expenses: Not hard at all  Food Insecurity: No Food Insecurity (10/02/2022)   Hunger Vital Sign    Worried About Running Out of Food in the Last Year: Never true    Ran Out of Food in the Last Year: Never true  Transportation Needs: No Transportation Needs (10/02/2022)   PRAPARE - Administrator, Civil Service (Medical): No    Lack of Transportation (Non-Medical): No  Physical Activity: Insufficiently Active (10/02/2022)   Exercise Vital Sign    Days of Exercise per Week: 7 days    Minutes of Exercise per Session: 20 min  Stress: No Stress Concern Present (10/02/2022)  [employer] of Occupational Health - Occupational Stress Questionnaire    Feeling of Stress : Not at all  Social Connections: Moderately Integrated (10/02/2022)   Social Connection and Isolation Panel [NHANES]    Frequency of Communication with Friends and Family: More than three times a week    Frequency of Social Gatherings with Friends and Family: Once a week    Attends Religious Services: Never    Database administrator or Organizations: Yes    Attends Banker Meetings: Never    Marital Status: Married    Tobacco Counseling Counseling given: Not Answered   Clinical Intake:  Pre-visit preparation completed: Yes  Pain : No/denies pain     BMI - recorded: 22 Nutritional Status: BMI of 19-24  Normal Nutritional Risks: None Diabetes: No (Prediabetic, does not check blood sugars)  How often do you need to have someone help you when you read instructions, pamphlets, or other written materials from your doctor or pharmacy?: 1 - Never What is the last grade level you completed in school?: Associates Degree   Interpreter Needed?: No  Information entered by :: Elyse Jarvis, CMA   Activities of Daily Living    10/02/2022   10:19 AM  In your present state of health, do you have any difficulty performing the following activities:  Hearing? 0  Vision?  0  Difficulty concentrating or making decisions? 0  Walking or climbing stairs? 0  Dressing or bathing? 0  Doing errands, shopping? 0  Preparing Food and eating ? N  Using the Toilet? N  In the past six months, have you accidently leaked urine? N  Do you have problems with loss of bowel control? N  Managing your Medications? N  Managing your Finances? N  Housekeeping or managing your Housekeeping? N    Patient Care Team: Pincus Sanes, MD as PCP - General (Internal Medicine) Allayne Butcher, PA-C as Physician Assistant (Cardiology) Rennis Golden Lisette Abu, MD as Consulting Physician (Cardiology) Szabat, Vinnie Level, [facility] (Inactive) (Pharmacist) Manning Charity, OD as Referring Physician (Optometry) Pa, [facility]  Indicate any recent Medical Services you may have received from other than [facility] providers in the past year (date may be approximate).     Assessment:   This is a routine wellness examination for [place].  Hearing/Vision screen Patient denied any hearing difficulty. No hearing aids. Patient does wear corrective lenses.  Dietary issues and exercise activities discussed: Current Exercise Habits: Home exercise routine, Type of exercise: walking, Time (Minutes): 20, Frequency (Times/Week): 7, Weekly Exercise (Minutes/Week): 140, Intensity: Mild, Exercise limited by: None identified   Goals Addressed             This Visit's Progress    Patient Stated       I would like to work on increasing my walk time to 45 mins daily.       Depression Screen    10/02/2022   10:16 AM 04/11/2022   11:12 AM 06/09/2021   10:13 AM 11/10/2020    9:53 AM 09/24/2019    8:31 AM 07/23/2019    8:48 AM 04/25/2018   12:38 PM  PHQ 2/9 Scores  PHQ - 2 Score 0 0 0 0 0 0 0  PHQ- 9 Score       3    Fall Risk    10/02/2022   10:19 AM 04/11/2022   11:09 AM 06/09/2021   10:09 AM 11/10/2020    9:53 AM 09/24/2019    8:30  AM  Fall Risk   Falls in the past year? 0 0 0 0 1   Number falls in past yr: 0 0 0  1  Injury with Fall? 0 0 0 0 0  Risk for fall due to : No Fall Risks No Fall Risks No Fall Risks  Other (Comment)  Risk for fall due to: Comment     tripped over a cord and trying to push garbage can from road.  Follow up Falls evaluation completed Falls evaluation completed Falls evaluation completed  Falls evaluation completed;Education provided;Falls prevention discussed    FALL RISK PREVENTION PERTAINING TO THE HOME:  Any stairs in or around the home? No  If so, are there any without handrails? No  Home free of loose throw rugs in walkways, pet beds, electrical cords, etc? Yes  Adequate lighting in your home to reduce risk of falls? Yes   ASSISTIVE DEVICES UTILIZED TO PREVENT FALLS:  Life alert? No  Use of a cane, walker or w/c? No  Grab bars in the bathroom? Yes  Shower chair or bench in shower? Yes  Elevated toilet seat or a handicapped toilet? No   TIMED UP AND GO:  Was the test performed? No .  Length of time to ambulate 10 feet: N/A sec.   Patient stated that she has no issues with gait or balance; does not use any assistive devices.  Cognitive Function:  Patient is cogitatively intact.      10/02/2022   10:20 AM 09/24/2019    8:33 AM  6CIT Screen  What Year? 0 points 0 points  What month? 0 points 0 points  What time? 0 points 0 points  Count back from 20 0 points 0 points  Months in reverse 0 points 0 points  Repeat phrase 0 points 0 points  Total Score 0 points 0 points    Immunizations Immunization History  Administered Date(s) Administered   Fluad Quad(high Dose 65+) 01/22/2019, 03/11/2020, 04/11/2022   Influenza, High Dose Seasonal PF 02/22/2016, 03/14/2017, 04/25/2018   Influenza,inj,Quad PF,6+ Mos 03/03/2014, 04/14/2015   Influenza-Unspecified 02/16/2017, 04/01/2021   PFIZER(Purple Top)SARS-COV-2 Vaccination 06/19/2019, 07/16/2019, 04/13/2020   PNEUMOCOCCAL CONJUGATE-20 04/11/2022   Pfizer Covid-19 Vaccine  Bivalent Booster 55yrs & up 04/01/2021   Pneumococcal Conjugate-13 04/13/2017   Pneumococcal Polysaccharide-23 08/29/2012   Tdap 08/28/2012    TDAP status: Due, Education has been provided regarding the importance of this vaccine. Advised may receive this vaccine at local pharmacy or Health Dept. Aware to provide a copy of the vaccination record if obtained from local pharmacy or Health Dept. Verbalized acceptance and understanding.  Flu Vaccine status: Up to date  Pneumococcal vaccine status: Up to date  Covid-19 vaccine status: Completed vaccines  Qualifies for Shingles Vaccine? Yes   Zostavax completed No   Shingrix Completed?: No.    Education has been provided regarding the importance of this vaccine. Patient has been advised to call insurance company to determine out of pocket expense if they have not yet received this vaccine. Advised may also receive vaccine at local pharmacy or Health Dept. Verbalized acceptance and understanding.  Screening Tests Health Maintenance  Topic Date Due   DEXA SCAN  09/11/2014   FOOT EXAM  01/02/2020   HEMOGLOBIN A1C  11/29/2021   OPHTHALMOLOGY EXAM  02/16/2022   Diabetic kidney evaluation - eGFR measurement  06/01/2022   Diabetic kidney evaluation - Urine ACR  06/01/2022   DTaP/Tdap/Td (2 - Td or Tdap) 08/29/2022   COVID-19 Vaccine (5 -  2023-24 season) 10/18/2022 (Originally 01/13/2022)   COLONOSCOPY (Pts 45-23yrs Insurance coverage will need to be confirmed)  10/02/2023 (Originally 05/03/1991)   INFLUENZA VACCINE  12/14/2022   Medicare Annual Wellness (AWV)  10/02/2023   Pneumonia Vaccine 68+ Years old  Completed   Hepatitis C Screening  Completed   HPV VACCINES  Aged Out   Zoster Vaccines- Shingrix  Discontinued    Health Maintenance  Health Maintenance Due  Topic Date Due   DEXA SCAN  09/11/2014   FOOT EXAM  01/02/2020   HEMOGLOBIN A1C  11/29/2021   OPHTHALMOLOGY EXAM  02/16/2022   Diabetic kidney evaluation - eGFR measurement   06/01/2022   Diabetic kidney evaluation - Urine ACR  06/01/2022   DTaP/Tdap/Td (2 - Td or Tdap) 08/29/2022    Colorectal cancer screening: due, patient would like to wait to discuss with Dr. Lawerance Bach  Mammogram status: No longer required due to age.  Bone Density status: Completed 08/31/2012. Results reflect: Bone density results: OSTEOPENIA. Repeat every 2 years.  Lung Cancer Screening: (Low Dose CT Chest recommended if Age 32-80 years, 30 pack-year currently smoking OR have quit w/in 15years.) does not qualify.   Lung Cancer Screening Referral: N/A  Additional Screening:  Hepatitis C Screening: does qualify; Completed 07/23/2019  Vision Screening: Recommended annual ophthalmology exams for early detection of glaucoma and other disorders of the eye. Is the patient up to date with their annual eye exam?  Yes  Who is the provider or what is the name of the office in which the patient attends annual eye exams? [facility] If pt is not established with a provider, would they like to be referred to a provider to establish care? No .   Dental Screening: Recommended annual dental exams for proper oral hygiene  Community Resource Referral / Chronic Care Management: CRR required this visit?  No   CCM required this visit?  No      Plan:     I have personally reviewed and noted the following in the patient's chart:   Medical and social history Use of alcohol, tobacco or illicit drugs  Current medications and supplements including opioid prescriptions. Patient is not currently taking opioid prescriptions. Functional ability and status Nutritional status Physical activity Advanced directives List of other physicians Hospitalizations, surgeries, and ER visits in previous 12 months Vitals Screenings to include cognitive, depression, and falls Referrals and appointments  In addition, I have reviewed and discussed with patient certain preventive protocols, quality metrics,  and best practice recommendations. A written personalized care plan for preventive services as well as general preventive health recommendations were provided to patient.     Marinus Maw, CMA   10/02/2022   Nurse Notes: N/A

## 2022-10-05 ENCOUNTER — Telehealth: Payer: Self-pay | Admitting: Internal Medicine

## 2022-10-05 NOTE — Telephone Encounter (Signed)
Cala Bradford from [employer] called to verify that their fax was received. It was sent on 09/27/2022 and 10/02/2022. It was an order for an autoimmune screening. Best callback for Cala Bradford is [phone].

## 2022-10-05 NOTE — Telephone Encounter (Signed)
Fax received but we will not be signing off on forms as some of these tend to be scams.

## 2022-10-10 ENCOUNTER — Ambulatory Visit: Payer: Medicare Other | Admitting: Internal Medicine

## 2022-12-28 ENCOUNTER — Encounter (INDEPENDENT_AMBULATORY_CARE_PROVIDER_SITE_OTHER): Payer: Self-pay

## 2023-01-17 ENCOUNTER — Other Ambulatory Visit: Payer: Self-pay | Admitting: Internal Medicine

## 2023-02-01 ENCOUNTER — Ambulatory Visit: Payer: Medicare Other | Admitting: Internal Medicine

## 2023-02-27 ENCOUNTER — Ambulatory Visit: Payer: Medicare Other | Admitting: Internal Medicine

## 2023-03-22 ENCOUNTER — Emergency Department (HOSPITAL_COMMUNITY): Payer: No Typology Code available for payment source

## 2023-03-22 ENCOUNTER — Inpatient Hospital Stay (HOSPITAL_COMMUNITY)
Admission: EM | Admit: 2023-03-22 | Discharge: 2023-03-28 | DRG: 638 | Disposition: A | Discharge status: Skilled Nursing Facility | Payer: No Typology Code available for payment source | Attending: Internal Medicine | Admitting: Internal Medicine

## 2023-03-22 ENCOUNTER — Encounter (HOSPITAL_COMMUNITY): Payer: Self-pay

## 2023-03-22 ENCOUNTER — Other Ambulatory Visit: Payer: Self-pay

## 2023-03-22 DIAGNOSIS — S42111D Displaced fracture of body of scapula, right shoulder, subsequent encounter for fracture with routine healing: Secondary | ICD-10-CM | POA: Diagnosis not present

## 2023-03-22 DIAGNOSIS — M25552 Pain in left hip: Secondary | ICD-10-CM | POA: Diagnosis not present

## 2023-03-22 DIAGNOSIS — W19XXXA Unspecified fall, initial encounter: Secondary | ICD-10-CM

## 2023-03-22 DIAGNOSIS — T383X6A Underdosing of insulin and oral hypoglycemic [antidiabetic] drugs, initial encounter: Secondary | ICD-10-CM | POA: Diagnosis present

## 2023-03-22 DIAGNOSIS — Y92009 Unspecified place in unspecified non-institutional (private) residence as the place of occurrence of the external cause: Secondary | ICD-10-CM

## 2023-03-22 DIAGNOSIS — E119 Type 2 diabetes mellitus without complications: Secondary | ICD-10-CM | POA: Diagnosis present

## 2023-03-22 DIAGNOSIS — S0990XA Unspecified injury of head, initial encounter: Secondary | ICD-10-CM | POA: Diagnosis not present

## 2023-03-22 DIAGNOSIS — R1030 Lower abdominal pain, unspecified: Secondary | ICD-10-CM | POA: Diagnosis present

## 2023-03-22 DIAGNOSIS — T07XXXA Unspecified multiple injuries, initial encounter: Secondary | ICD-10-CM | POA: Diagnosis not present

## 2023-03-22 DIAGNOSIS — M6282 Rhabdomyolysis: Secondary | ICD-10-CM | POA: Diagnosis present

## 2023-03-22 DIAGNOSIS — M79661 Pain in right lower leg: Secondary | ICD-10-CM | POA: Diagnosis not present

## 2023-03-22 DIAGNOSIS — R000 Tachycardia, unspecified: Secondary | ICD-10-CM | POA: Diagnosis present

## 2023-03-22 DIAGNOSIS — Z91199 Patient's noncompliance with other medical treatment and regimen due to unspecified reason: Secondary | ICD-10-CM | POA: Diagnosis not present

## 2023-03-22 DIAGNOSIS — E1169 Type 2 diabetes mellitus with other specified complication: Secondary | ICD-10-CM | POA: Diagnosis present

## 2023-03-22 DIAGNOSIS — N390 Urinary tract infection, site not specified: Secondary | ICD-10-CM | POA: Diagnosis not present

## 2023-03-22 DIAGNOSIS — F411 Generalized anxiety disorder: Secondary | ICD-10-CM | POA: Diagnosis not present

## 2023-03-22 DIAGNOSIS — M25571 Pain in right ankle and joints of right foot: Secondary | ICD-10-CM | POA: Diagnosis not present

## 2023-03-22 DIAGNOSIS — E785 Hyperlipidemia, unspecified: Secondary | ICD-10-CM

## 2023-03-22 DIAGNOSIS — Z823 Family history of stroke: Secondary | ICD-10-CM | POA: Diagnosis not present

## 2023-03-22 DIAGNOSIS — R739 Hyperglycemia, unspecified: Secondary | ICD-10-CM | POA: Diagnosis not present

## 2023-03-22 DIAGNOSIS — E860 Dehydration: Secondary | ICD-10-CM | POA: Diagnosis present

## 2023-03-22 DIAGNOSIS — R0689 Other abnormalities of breathing: Secondary | ICD-10-CM | POA: Diagnosis not present

## 2023-03-22 DIAGNOSIS — Z7984 Long term (current) use of oral hypoglycemic drugs: Secondary | ICD-10-CM | POA: Diagnosis not present

## 2023-03-22 DIAGNOSIS — M778 Other enthesopathies, not elsewhere classified: Secondary | ICD-10-CM | POA: Diagnosis not present

## 2023-03-22 DIAGNOSIS — E1165 Type 2 diabetes mellitus with hyperglycemia: Principal | ICD-10-CM

## 2023-03-22 DIAGNOSIS — S42111A Displaced fracture of body of scapula, right shoulder, initial encounter for closed fracture: Secondary | ICD-10-CM | POA: Diagnosis not present

## 2023-03-22 DIAGNOSIS — G319 Degenerative disease of nervous system, unspecified: Secondary | ICD-10-CM | POA: Diagnosis not present

## 2023-03-22 DIAGNOSIS — S80812A Abrasion, left lower leg, initial encounter: Secondary | ICD-10-CM | POA: Diagnosis present

## 2023-03-22 DIAGNOSIS — Z951 Presence of aortocoronary bypass graft: Secondary | ICD-10-CM | POA: Diagnosis not present

## 2023-03-22 DIAGNOSIS — Z79899 Other long term (current) drug therapy: Secondary | ICD-10-CM

## 2023-03-22 DIAGNOSIS — Z7401 Bed confinement status: Secondary | ICD-10-CM | POA: Diagnosis not present

## 2023-03-22 DIAGNOSIS — Z91128 Patient's intentional underdosing of medication regimen for other reason: Secondary | ICD-10-CM | POA: Diagnosis not present

## 2023-03-22 DIAGNOSIS — I2510 Atherosclerotic heart disease of native coronary artery without angina pectoris: Secondary | ICD-10-CM | POA: Diagnosis present

## 2023-03-22 DIAGNOSIS — F32A Depression, unspecified: Secondary | ICD-10-CM | POA: Diagnosis present

## 2023-03-22 DIAGNOSIS — M6281 Muscle weakness (generalized): Secondary | ICD-10-CM | POA: Diagnosis present

## 2023-03-22 DIAGNOSIS — R29890 Loss of height: Secondary | ICD-10-CM | POA: Diagnosis not present

## 2023-03-22 DIAGNOSIS — R55 Syncope and collapse: Secondary | ICD-10-CM | POA: Diagnosis present

## 2023-03-22 DIAGNOSIS — Z8262 Family history of osteoporosis: Secondary | ICD-10-CM

## 2023-03-22 DIAGNOSIS — M19011 Primary osteoarthritis, right shoulder: Secondary | ICD-10-CM | POA: Diagnosis not present

## 2023-03-22 DIAGNOSIS — S32010S Wedge compression fracture of first lumbar vertebra, sequela: Secondary | ICD-10-CM

## 2023-03-22 DIAGNOSIS — M25561 Pain in right knee: Secondary | ICD-10-CM | POA: Diagnosis not present

## 2023-03-22 DIAGNOSIS — T39395A Adverse effect of other nonsteroidal anti-inflammatory drugs [NSAID], initial encounter: Secondary | ICD-10-CM | POA: Diagnosis present

## 2023-03-22 DIAGNOSIS — Z8249 Family history of ischemic heart disease and other diseases of the circulatory system: Secondary | ICD-10-CM | POA: Diagnosis not present

## 2023-03-22 DIAGNOSIS — R0781 Pleurodynia: Secondary | ICD-10-CM | POA: Diagnosis not present

## 2023-03-22 DIAGNOSIS — S32010D Wedge compression fracture of first lumbar vertebra, subsequent encounter for fracture with routine healing: Secondary | ICD-10-CM | POA: Diagnosis not present

## 2023-03-22 DIAGNOSIS — Z8679 Personal history of other diseases of the circulatory system: Secondary | ICD-10-CM

## 2023-03-22 DIAGNOSIS — Z741 Need for assistance with personal care: Secondary | ICD-10-CM | POA: Diagnosis present

## 2023-03-22 DIAGNOSIS — S46011A Strain of muscle(s) and tendon(s) of the rotator cuff of right shoulder, initial encounter: Secondary | ICD-10-CM | POA: Diagnosis not present

## 2023-03-22 DIAGNOSIS — R1111 Vomiting without nausea: Secondary | ICD-10-CM | POA: Diagnosis not present

## 2023-03-22 DIAGNOSIS — R4189 Other symptoms and signs involving cognitive functions and awareness: Secondary | ICD-10-CM | POA: Diagnosis not present

## 2023-03-22 DIAGNOSIS — Z7982 Long term (current) use of aspirin: Secondary | ICD-10-CM

## 2023-03-22 DIAGNOSIS — M7581 Other shoulder lesions, right shoulder: Secondary | ICD-10-CM | POA: Diagnosis not present

## 2023-03-22 DIAGNOSIS — M4856XA Collapsed vertebra, not elsewhere classified, lumbar region, initial encounter for fracture: Secondary | ICD-10-CM | POA: Diagnosis not present

## 2023-03-22 DIAGNOSIS — I10 Essential (primary) hypertension: Secondary | ICD-10-CM

## 2023-03-22 DIAGNOSIS — N179 Acute kidney failure, unspecified: Secondary | ICD-10-CM

## 2023-03-22 DIAGNOSIS — I252 Old myocardial infarction: Secondary | ICD-10-CM

## 2023-03-22 DIAGNOSIS — M8580 Other specified disorders of bone density and structure, unspecified site: Secondary | ICD-10-CM | POA: Diagnosis present

## 2023-03-22 DIAGNOSIS — F419 Anxiety disorder, unspecified: Secondary | ICD-10-CM | POA: Diagnosis present

## 2023-03-22 DIAGNOSIS — N1832 Chronic kidney disease, stage 3b: Secondary | ICD-10-CM

## 2023-03-22 DIAGNOSIS — E559 Vitamin D deficiency, unspecified: Secondary | ICD-10-CM | POA: Diagnosis present

## 2023-03-22 DIAGNOSIS — Z820 Family history of epilepsy and other diseases of the nervous system: Secondary | ICD-10-CM

## 2023-03-22 DIAGNOSIS — I6782 Cerebral ischemia: Secondary | ICD-10-CM | POA: Diagnosis not present

## 2023-03-22 DIAGNOSIS — M25511 Pain in right shoulder: Secondary | ICD-10-CM | POA: Diagnosis present

## 2023-03-22 DIAGNOSIS — Z23 Encounter for immunization: Secondary | ICD-10-CM | POA: Diagnosis not present

## 2023-03-22 DIAGNOSIS — M79662 Pain in left lower leg: Secondary | ICD-10-CM | POA: Diagnosis not present

## 2023-03-22 DIAGNOSIS — E038 Other specified hypothyroidism: Secondary | ICD-10-CM | POA: Diagnosis present

## 2023-03-22 DIAGNOSIS — S32010A Wedge compression fracture of first lumbar vertebra, initial encounter for closed fracture: Secondary | ICD-10-CM | POA: Diagnosis present

## 2023-03-22 DIAGNOSIS — E1159 Type 2 diabetes mellitus with other circulatory complications: Secondary | ICD-10-CM | POA: Insufficient documentation

## 2023-03-22 DIAGNOSIS — S199XXA Unspecified injury of neck, initial encounter: Secondary | ICD-10-CM | POA: Diagnosis not present

## 2023-03-22 DIAGNOSIS — J40 Bronchitis, not specified as acute or chronic: Secondary | ICD-10-CM | POA: Diagnosis not present

## 2023-03-22 DIAGNOSIS — Z043 Encounter for examination and observation following other accident: Secondary | ICD-10-CM | POA: Diagnosis not present

## 2023-03-22 DIAGNOSIS — N183 Chronic kidney disease, stage 3 unspecified: Secondary | ICD-10-CM | POA: Diagnosis present

## 2023-03-22 DIAGNOSIS — R1031 Right lower quadrant pain: Secondary | ICD-10-CM | POA: Diagnosis not present

## 2023-03-22 DIAGNOSIS — Z833 Family history of diabetes mellitus: Secondary | ICD-10-CM

## 2023-03-22 DIAGNOSIS — I700 Atherosclerosis of aorta: Secondary | ICD-10-CM | POA: Diagnosis present

## 2023-03-22 DIAGNOSIS — M7989 Other specified soft tissue disorders: Secondary | ICD-10-CM | POA: Diagnosis not present

## 2023-03-22 DIAGNOSIS — M898X8 Other specified disorders of bone, other site: Secondary | ICD-10-CM | POA: Diagnosis not present

## 2023-03-22 DIAGNOSIS — M25572 Pain in left ankle and joints of left foot: Secondary | ICD-10-CM | POA: Diagnosis not present

## 2023-03-22 DIAGNOSIS — Z8719 Personal history of other diseases of the digestive system: Secondary | ICD-10-CM

## 2023-03-22 DIAGNOSIS — K219 Gastro-esophageal reflux disease without esophagitis: Secondary | ICD-10-CM | POA: Diagnosis present

## 2023-03-22 DIAGNOSIS — M25411 Effusion, right shoulder: Secondary | ICD-10-CM | POA: Diagnosis not present

## 2023-03-22 DIAGNOSIS — M25551 Pain in right hip: Secondary | ICD-10-CM | POA: Diagnosis not present

## 2023-03-22 DIAGNOSIS — Z8052 Family history of malignant neoplasm of bladder: Secondary | ICD-10-CM

## 2023-03-22 LAB — I-STAT VENOUS BLOOD GAS, ED
Acid-base deficit: 7 mmol/L — ABNORMAL HIGH (ref 0.0–2.0)
Bicarbonate: 17.7 mmol/L — ABNORMAL LOW (ref 20.0–28.0)
Calcium, Ion: 1.18 mmol/L (ref 1.15–1.40)
HCT: 34 % — ABNORMAL LOW (ref 36.0–46.0)
Hemoglobin: 11.6 g/dL — ABNORMAL LOW (ref 12.0–15.0)
O2 Saturation: 100 %
Potassium: 4.1 mmol/L (ref 3.5–5.1)
Sodium: 133 mmol/L — ABNORMAL LOW (ref 135–145)
TCO2: 19 mmol/L — ABNORMAL LOW (ref 22–32)
pCO2, Ven: 31.3 mm[Hg] — ABNORMAL LOW (ref 44–60)
pH, Ven: 7.361 (ref 7.25–7.43)
pO2, Ven: 185 mm[Hg] — ABNORMAL HIGH (ref 32–45)

## 2023-03-22 LAB — URINALYSIS, W/ REFLEX TO CULTURE (INFECTION SUSPECTED)
Bilirubin Urine: NEGATIVE
Glucose, UA: >=500 mg/dL — AB
Ketones, ur: 5 mg/dL — AB
Leukocytes,Ua: NEGATIVE
Nitrite: POSITIVE — AB
Protein, ur: NEGATIVE mg/dL
Specific Gravity, Urine: 1.028 (ref 1.005–1.030)
pH: 5 (ref 5.0–8.0)

## 2023-03-22 LAB — COMPREHENSIVE METABOLIC PANEL
ALT: 21 U/L (ref 0–44)
AST: 29 U/L (ref 15–41)
Albumin: 3.7 g/dL (ref 3.5–5.0)
Alkaline Phosphatase: 86 U/L (ref 38–126)
Anion gap: 14 (ref 5–15)
BUN: 25 mg/dL — ABNORMAL HIGH (ref 8–23)
CO2: 17 mmol/L — ABNORMAL LOW (ref 22–32)
Calcium: 9.8 mg/dL (ref 8.9–10.3)
Chloride: 102 mmol/L (ref 98–111)
Creatinine, Ser: 1.37 mg/dL — ABNORMAL HIGH (ref 0.44–1.00)
GFR, Estimated: 40 mL/min — ABNORMAL LOW (ref >60)
Glucose, Bld: 489 mg/dL — ABNORMAL HIGH (ref 70–99)
Potassium: 4.2 mmol/L (ref 3.5–5.1)
Sodium: 133 mmol/L — ABNORMAL LOW (ref 135–145)
Total Bilirubin: 1 mg/dL (ref <1.2)
Total Protein: 6.5 g/dL (ref 6.5–8.1)

## 2023-03-22 LAB — CBC WITH DIFFERENTIAL/PLATELET
Abs Immature Granulocytes: 0.12 10*3/uL — ABNORMAL HIGH (ref 0.00–0.07)
Basophils Absolute: 0.1 10*3/uL (ref 0.0–0.1)
Basophils Relative: 0 %
Eosinophils Absolute: 0.1 10*3/uL (ref 0.0–0.5)
Eosinophils Relative: 1 %
HCT: 35.2 % — ABNORMAL LOW (ref 36.0–46.0)
Hemoglobin: 11.3 g/dL — ABNORMAL LOW (ref 12.0–15.0)
Immature Granulocytes: 1 %
Lymphocytes Relative: 16 %
Lymphs Abs: 2 10*3/uL (ref 0.7–4.0)
MCH: 27.8 pg (ref 26.0–34.0)
MCHC: 32.1 g/dL (ref 30.0–36.0)
MCV: 86.5 fL (ref 80.0–100.0)
Monocytes Absolute: 0.7 10*3/uL (ref 0.1–1.0)
Monocytes Relative: 6 %
Neutro Abs: 9 10*3/uL — ABNORMAL HIGH (ref 1.7–7.7)
Neutrophils Relative %: 76 %
Platelets: 181 10*3/uL (ref 150–400)
RBC: 4.07 MIL/uL (ref 3.87–5.11)
RDW: 14.4 % (ref 11.5–15.5)
WBC: 11.9 10*3/uL — ABNORMAL HIGH (ref 4.0–10.5)
nRBC: 0 % (ref 0.0–0.2)

## 2023-03-22 LAB — CBG MONITORING, ED
Glucose-Capillary: 284 mg/dL — ABNORMAL HIGH (ref 70–99)
Glucose-Capillary: 495 mg/dL — ABNORMAL HIGH (ref 70–99)

## 2023-03-22 LAB — BETA-HYDROXYBUTYRIC ACID: Beta-Hydroxybutyric Acid: 0.23 mmol/L (ref 0.05–0.27)

## 2023-03-22 MED ORDER — SODIUM CHLORIDE 0.9 % IV SOLN
2.0000 g | Freq: Once | INTRAVENOUS | Status: AC
  Administered 2023-03-23: 2 g via INTRAVENOUS
  Filled 2023-03-22: qty 20

## 2023-03-22 MED ORDER — ACETAMINOPHEN 325 MG PO TABS: 650.0000 mg | ORAL_TABLET | Freq: Four times a day (QID) | ORAL | Status: DC | PRN

## 2023-03-22 MED ORDER — ASPIRIN 81 MG PO TBEC
81.0000 mg | DELAYED_RELEASE_TABLET | Freq: Every day | ORAL | Status: DC
  Administered 2023-03-23 – 2023-03-28 (×6): 81 mg via ORAL
  Filled 2023-03-22 (×6): qty 1

## 2023-03-22 MED ORDER — LACTATED RINGERS IV BOLUS
500.0000 mL | Freq: Once | INTRAVENOUS | Status: AC
Start: 2023-03-22 — End: 2023-03-22
  Administered 2023-03-22: 500 mL via INTRAVENOUS

## 2023-03-22 MED ORDER — ACETAMINOPHEN 650 MG RE SUPP: 650.0000 mg | Freq: Four times a day (QID) | RECTAL | Status: DC | PRN

## 2023-03-22 MED ORDER — INSULIN ASPART 100 UNIT/ML IJ SOLN
5.0000 [IU] | Freq: Once | INTRAMUSCULAR | Status: AC
  Administered 2023-03-23: 5 [IU] via INTRAVENOUS

## 2023-03-22 MED ORDER — ESCITALOPRAM OXALATE 10 MG PO TABS: 10.0000 mg | ORAL_TABLET | Freq: Every day | ORAL | Status: DC

## 2023-03-22 MED ORDER — SODIUM CHLORIDE 0.9% FLUSH
3.0000 mL | Freq: Two times a day (BID) | INTRAVENOUS | Status: DC
  Administered 2023-03-23 – 2023-03-27 (×10): 3 mL via INTRAVENOUS

## 2023-03-22 MED ORDER — TETANUS-DIPHTH-ACELL PERTUSSIS 5-2.5-18.5 LF-MCG/0.5 IM SUSY
0.5000 mL | PREFILLED_SYRINGE | Freq: Once | INTRAMUSCULAR | Status: AC
  Administered 2023-03-22: 0.5 mL via INTRAMUSCULAR
  Filled 2023-03-22: qty 0.5

## 2023-03-22 MED ORDER — CARVEDILOL 3.125 MG PO TABS
3.1250 mg | ORAL_TABLET | Freq: Two times a day (BID) | ORAL | Status: DC
  Administered 2023-03-23 – 2023-03-28 (×12): 3.125 mg via ORAL
  Filled 2023-03-22 (×12): qty 1

## 2023-03-22 MED ORDER — FENTANYL CITRATE PF 50 MCG/ML IJ SOSY
50.0000 ug | PREFILLED_SYRINGE | Freq: Once | INTRAMUSCULAR | Status: AC
  Administered 2023-03-22: 50 ug via INTRAVENOUS
  Filled 2023-03-22: qty 1

## 2023-03-22 MED ORDER — LACTATED RINGERS BOLUS PEDS: 1000.0000 mL | Freq: Once | INTRAVENOUS | Status: DC

## 2023-03-22 MED ORDER — CYCLOBENZAPRINE HCL 10 MG PO TABS
5.0000 mg | ORAL_TABLET | Freq: Once | ORAL | Status: AC
  Administered 2023-03-23: 5 mg via ORAL
  Filled 2023-03-22: qty 1

## 2023-03-22 MED ORDER — LACTATED RINGERS IV BOLUS
1000.0000 mL | INTRAVENOUS | Status: AC
  Administered 2023-03-23: 1000 mL via INTRAVENOUS

## 2023-03-22 MED ORDER — INSULIN DETEMIR 100 UNIT/ML ~~LOC~~ SOLN
10.0000 [IU] | Freq: Every day | SUBCUTANEOUS | Status: DC
  Administered 2023-03-23 (×2): 10 [IU] via SUBCUTANEOUS
  Filled 2023-03-22 (×3): qty 0.1

## 2023-03-22 MED ORDER — SODIUM CHLORIDE 0.9 % IV SOLN: 250.0000 mL | INTRAVENOUS | Status: AC | PRN

## 2023-03-22 MED ORDER — INSULIN ASPART 100 UNIT/ML IJ SOLN
0.0000 [IU] | Freq: Every day | INTRAMUSCULAR | Status: DC
  Administered 2023-03-23: 3 [IU] via SUBCUTANEOUS
  Administered 2023-03-24: 2 [IU] via SUBCUTANEOUS
  Administered 2023-03-25 – 2023-03-27 (×2): 3 [IU] via SUBCUTANEOUS

## 2023-03-22 MED ORDER — LACTATED RINGERS IV SOLN: INTRAVENOUS | Status: AC

## 2023-03-22 MED ORDER — IOHEXOL 350 MG/ML SOLN
55.0000 mL | Freq: Once | INTRAVENOUS | Status: AC | PRN
  Administered 2023-03-22: 55 mL via INTRAVENOUS

## 2023-03-22 MED ORDER — ONDANSETRON HCL 4 MG/2ML IJ SOLN
4.0000 mg | Freq: Once | INTRAMUSCULAR | Status: AC
  Administered 2023-03-22: 4 mg via INTRAVENOUS
  Filled 2023-03-22: qty 2

## 2023-03-22 MED ORDER — INSULIN ASPART 100 UNIT/ML IJ SOLN
0.0000 [IU] | Freq: Three times a day (TID) | INTRAMUSCULAR | Status: DC
  Administered 2023-03-23: 1 [IU] via SUBCUTANEOUS
  Administered 2023-03-23: 3 [IU] via SUBCUTANEOUS
  Administered 2023-03-23: 1 [IU] via SUBCUTANEOUS
  Administered 2023-03-24: 3 [IU] via SUBCUTANEOUS
  Administered 2023-03-24 (×2): 2 [IU] via SUBCUTANEOUS
  Administered 2023-03-25 (×2): 1 [IU] via SUBCUTANEOUS
  Administered 2023-03-25: 2 [IU] via SUBCUTANEOUS
  Administered 2023-03-26: 4 [IU] via SUBCUTANEOUS
  Administered 2023-03-26: 5 [IU] via SUBCUTANEOUS
  Administered 2023-03-26: 2 [IU] via SUBCUTANEOUS
  Administered 2023-03-27 (×2): 3 [IU] via SUBCUTANEOUS
  Administered 2023-03-28 (×2): 1 [IU] via SUBCUTANEOUS

## 2023-03-22 MED ORDER — ROSUVASTATIN CALCIUM 20 MG PO TABS
40.0000 mg | ORAL_TABLET | Freq: Every day | ORAL | Status: DC
Start: 2023-03-23 — End: 2023-03-28
  Administered 2023-03-23 – 2023-03-28 (×6): 40 mg via ORAL
  Filled 2023-03-22 (×6): qty 2

## 2023-03-22 MED ORDER — CARVEDILOL 3.125 MG PO TABS: 3.1250 mg | ORAL_TABLET | Freq: Two times a day (BID) | ORAL | Status: DC

## 2023-03-22 MED ORDER — SODIUM CHLORIDE 0.9% FLUSH
3.0000 mL | INTRAVENOUS | Status: DC | PRN
Start: 2023-03-22 — End: 2023-03-28

## 2023-03-22 MED ORDER — SENNOSIDES-DOCUSATE SODIUM 8.6-50 MG PO TABS
1.0000 | ORAL_TABLET | Freq: Every evening | ORAL | Status: DC | PRN
  Administered 2023-03-25 – 2023-03-26 (×2): 1 via ORAL
  Filled 2023-03-22 (×3): qty 1

## 2023-03-22 MED ORDER — OXYCODONE HCL 5 MG PO TABS
5.0000 mg | ORAL_TABLET | Freq: Once | ORAL | Status: AC
  Administered 2023-03-23: 5 mg via ORAL
  Filled 2023-03-22: qty 1

## 2023-03-22 MED ORDER — ENOXAPARIN SODIUM 40 MG/0.4ML IJ SOSY
40.0000 mg | PREFILLED_SYRINGE | INTRAMUSCULAR | Status: DC
  Administered 2023-03-23 – 2023-03-27 (×5): 40 mg via SUBCUTANEOUS
  Filled 2023-03-22 (×5): qty 0.4

## 2023-03-22 MED ORDER — INSULIN ASPART 100 UNIT/ML IJ SOLN: 5.0000 [IU] | INTRAMUSCULAR | Status: DC
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
## 2023-03-22 NOTE — ED Notes (Signed)
Phlebotomy at bedside attempting blood collection.
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
## 2023-03-22 NOTE — H&P (Addendum)
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 compression fracture chronic -CT lumbar spine showed chronic compression fracture of the L1 vertebrae.  There is no acute fracture or subluxation. -Given it is a chronic compression fracture there is no indication for surgical intervention.  Consulted inpatient PT and OT for evaluation of balance.  Continue Tylenol as needed.  On discharge need to refer to outpatient orthopedics for outpatient evaluation.   Essential hypertension History of sinus tachycardia -Blood pressure within good range.  Heart rate in between 118-1 24.  EKG showing sinus tachycardia. - Continue Coreg 3.125 mg twice daily. -Continue cardiac monitoring.  History of CAD status post CABG 2015 -Continue aspirin, Coreg and rosuvastatin.  Generalized anxiety disorder - Continue Lexapro  In the ED patient  received 1 dose of ceftriaxone for concern for UTI.  UA showed evidence of UTI however patient denies any increased urinary urgency, frequency, dysuria.   Denies any suprapubic pain.  Denies any fever and chills.  As patient is asymptomatic and there is no indication for treat for UTI at this time.  DVT prophylaxis:  Lovenox Code Status:  Full Code Diet: Clear liquid diet.  Will advance diet once blood glucose is more well-controlled. Family Communication: No family member at bedside now. Disposition Plan: Tentative discharge to home versus skilled nursing facility based on PT evaluation Consults: PT and OT Admission status:   Inpatient, Step Down Unit  Severity of Illness: The appropriate patient status for this patient is INPATIENT. Inpatient status is judged to be reasonable and necessary in order to provide the required intensity of service to ensure the patient's safety. The patient's presenting symptoms, physical exam findings, and initial radiographic and laboratory data in the context of their chronic comorbidities is felt to place them at high risk for further clinical deterioration. Furthermore, it is not anticipated that the patient will be medically stable for discharge from the hospital within 2 midnights of admission.   * I certify that at the point of admission it is my clinical judgment that the patient will require inpatient hospital care spanning beyond 2 midnights from the point of admission due to high intensity of service, high risk for further deterioration and high frequency of surveillance required.[name]    Tereasa Coop, MD [facility]  How to contact the [facility] Attending or Consulting provider 7A - 7P or covering provider during after hours 7P -7A, for this patient.  Check the care team in [facility] and look for a) attending/consulting [facility] provider listed and b) the [facility] team listed Log into [email or web address] and use 's universal password to access. If you do not have the password, please contact the hospital operator. Locate the [facility] provider you are looking for under [facility] and page to a number that you  can be directly reached. If you still have difficulty reaching the provider, please page the [facility] (Director on Call) for the Hospitalists listed on amion for assistance.  03/23/2023, 1:56 AM

## 2023-03-22 NOTE — ED Notes (Signed)
Patient transported to X-ray and CT 

## 2023-03-23 ENCOUNTER — Telehealth: Payer: Self-pay | Admitting: Internal Medicine

## 2023-03-23 DIAGNOSIS — E1165 Type 2 diabetes mellitus with hyperglycemia: Secondary | ICD-10-CM | POA: Diagnosis not present

## 2023-03-23 LAB — HEMOGLOBIN A1C
Hgb A1c MFr Bld: 9.4 % — ABNORMAL HIGH (ref 4.8–5.6)
Mean Plasma Glucose: 223.08 mg/dL

## 2023-03-23 LAB — CBC
HCT: 29.5 % — ABNORMAL LOW (ref 36.0–46.0)
Hemoglobin: 9.8 g/dL — ABNORMAL LOW (ref 12.0–15.0)
MCH: 27.9 pg (ref 26.0–34.0)
MCHC: 33.2 g/dL (ref 30.0–36.0)
MCV: 84 fL (ref 80.0–100.0)
Platelets: 168 10*3/uL (ref 150–400)
RBC: 3.51 MIL/uL — ABNORMAL LOW (ref 3.87–5.11)
RDW: 14.5 % (ref 11.5–15.5)
WBC: 8.7 10*3/uL (ref 4.0–10.5)
nRBC: 0 % (ref 0.0–0.2)

## 2023-03-23 LAB — GLUCOSE, CAPILLARY
Glucose-Capillary: 192 mg/dL — ABNORMAL HIGH (ref 70–99)
Glucose-Capillary: 237 mg/dL — ABNORMAL HIGH (ref 70–99)
Glucose-Capillary: 264 mg/dL — ABNORMAL HIGH (ref 70–99)

## 2023-03-23 LAB — COMPREHENSIVE METABOLIC PANEL
ALT: 17 U/L (ref 0–44)
AST: 24 U/L (ref 15–41)
Albumin: 2.9 g/dL — ABNORMAL LOW (ref 3.5–5.0)
Alkaline Phosphatase: 72 U/L (ref 38–126)
Anion gap: 9 (ref 5–15)
BUN: 19 mg/dL (ref 8–23)
CO2: 23 mmol/L (ref 22–32)
Calcium: 9.1 mg/dL (ref 8.9–10.3)
Chloride: 107 mmol/L (ref 98–111)
Creatinine, Ser: 1.18 mg/dL — ABNORMAL HIGH (ref 0.44–1.00)
GFR, Estimated: 48 mL/min — ABNORMAL LOW (ref >60)
Glucose, Bld: 193 mg/dL — ABNORMAL HIGH (ref 70–99)
Potassium: 3.8 mmol/L (ref 3.5–5.1)
Sodium: 139 mmol/L (ref 135–145)
Total Bilirubin: 0.7 mg/dL (ref <1.2)
Total Protein: 5.7 g/dL — ABNORMAL LOW (ref 6.5–8.1)

## 2023-03-23 LAB — CBG MONITORING, ED
Glucose-Capillary: 186 mg/dL — ABNORMAL HIGH (ref 70–99)
Glucose-Capillary: 172 mg/dL — ABNORMAL HIGH (ref 70–99)
Glucose-Capillary: 263 mg/dL — ABNORMAL HIGH (ref 70–99)

## 2023-03-23 LAB — CK: Total CK: 508 U/L — ABNORMAL HIGH (ref 38–234)

## 2023-03-23 LAB — OSMOLALITY: Osmolality: 299 mosm/kg — ABNORMAL HIGH (ref 275–295)

## 2023-03-23 MED ORDER — METFORMIN HCL 500 MG PO TABS: 500.0000 mg | ORAL_TABLET | Freq: Two times a day (BID) | ORAL | Status: DC

## 2023-03-23 MED ORDER — VITAMIN B-12 1000 MCG PO TABS
1000.0000 ug | ORAL_TABLET | Freq: Every day | ORAL | Status: DC
  Administered 2023-03-23 – 2023-03-28 (×6): 1000 ug via ORAL
  Filled 2023-03-23 (×6): qty 1

## 2023-03-23 MED ORDER — GLIMEPIRIDE 4 MG PO TABS
4.0000 mg | ORAL_TABLET | Freq: Every day | ORAL | Status: DC
  Administered 2023-03-24: 4 mg via ORAL
  Filled 2023-03-23: qty 1

## 2023-03-23 MED ORDER — INFLUENZA VAC A&B SURF ANT ADJ 0.5 ML IM SUSY
0.5000 mL | PREFILLED_SYRINGE | INTRAMUSCULAR | Status: AC
  Administered 2023-03-25: 0.5 mL via INTRAMUSCULAR
  Filled 2023-03-23: qty 0.5

## 2023-03-23 MED ORDER — OXYCODONE HCL 5 MG PO TABS
5.0000 mg | ORAL_TABLET | Freq: Four times a day (QID) | ORAL | Status: DC | PRN
  Administered 2023-03-23: 5 mg via ORAL
  Filled 2023-03-23: qty 1

## 2023-03-23 MED ORDER — OXYCODONE HCL 5 MG PO TABS
5.0000 mg | ORAL_TABLET | Freq: Three times a day (TID) | ORAL | Status: DC | PRN
  Administered 2023-03-23 – 2023-03-26 (×4): 5 mg via ORAL
  Filled 2023-03-23 (×4): qty 1
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
## 2023-03-23 NOTE — Telephone Encounter (Signed)
Spoke to her husband. He is concerned about her mental acuity.   She is currently in the hospital.   Discussed his concerns
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
## 2023-03-23 NOTE — Telephone Encounter (Signed)
Patient's husband needs to speak to you about her mental situation.  [phone]

## 2023-03-23 NOTE — Inpatient Diabetes Management (Signed)
Inpatient Diabetes Program Recommendations  AACE/ADA: New Consensus Statement on Inpatient Glycemic Control (2015)  Target Ranges:  Prepandial:   less than 140 mg/dL      Peak postprandial:   less than 180 mg/dL (1-2 hours)      Critically ill patients:  140 - 180 mg/dL   Lab Results  Component Value Date   GLUCAP 172 (H) 03/23/2023   HGBA1C 9.4 (H) 03/23/2023    Review of Glycemic Control  Latest Reference Range & Units 03/22/23 17:21 03/22/23 23:40 03/23/23 06:42 03/23/23 08:32  Glucose-Capillary 70 - 99 mg/dL 696 (H) 295 (H) 284 (H) 172 (H)   Diabetes history: DM 2 Outpatient Diabetes medications: Amaryl 4 mg Daily, Metformin 500 mg bid, Farxiga 10 mg Daily Current orders for Inpatient glycemic control:  Amaryl 4 mg Daily Novolog 0-6 units tid Levemir 10 units Daily  A1c 9.4%  Spoke with pt at bedside regarding A1c of 9.4% and glucose control at home. Pt reports following her PCP every 6 months for DM management at home. Pt reports she keeps her A1c between 7-8%. Pt reports not being able to take her medications in a week due to having a bad week. Pt reports being on 3 oral medication for glucose control at home (Amaryl 4 mg Daily, metformin 500 mg bid, Farxiga 10 mg Daily). Pt reports having a follow up appointment with her PCP on Tuesday. Discussed current A1c level of 9.4% and reviewed A1c and glucose goals for home management. Encouraged PCP follow up.   Note: pt on Farxiga at home, with pt having a UTI would recommend holding this medication at time of discharge and follow up with prescriber.   D/C needs: - pt needs new glucose meter kit at time of discharge order #[id number]  Thanks,  Christena Deem RN, MSN, BC-ADM Inpatient Diabetes Coordinator Team Pager [phone] (8a-5p)
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
## 2023-03-24 DIAGNOSIS — W19XXXA Unspecified fall, initial encounter: Secondary | ICD-10-CM | POA: Diagnosis not present

## 2023-03-24 DIAGNOSIS — E1165 Type 2 diabetes mellitus with hyperglycemia: Secondary | ICD-10-CM | POA: Diagnosis not present

## 2023-03-24 DIAGNOSIS — N179 Acute kidney failure, unspecified: Secondary | ICD-10-CM | POA: Diagnosis not present

## 2023-03-24 DIAGNOSIS — E119 Type 2 diabetes mellitus without complications: Secondary | ICD-10-CM | POA: Diagnosis not present

## 2023-03-24 LAB — COMPREHENSIVE METABOLIC PANEL
ALT: 14 U/L (ref 0–44)
AST: 19 U/L (ref 15–41)
Albumin: 2.6 g/dL — ABNORMAL LOW (ref 3.5–5.0)
Alkaline Phosphatase: 65 U/L (ref 38–126)
Anion gap: 6 (ref 5–15)
BUN: 17 mg/dL (ref 8–23)
CO2: 22 mmol/L (ref 22–32)
Calcium: 8.6 mg/dL — ABNORMAL LOW (ref 8.9–10.3)
Chloride: 106 mmol/L (ref 98–111)
Creatinine, Ser: 1.16 mg/dL — ABNORMAL HIGH (ref 0.44–1.00)
GFR, Estimated: 49 mL/min — ABNORMAL LOW (ref >60)
Glucose, Bld: 265 mg/dL — ABNORMAL HIGH (ref 70–99)
Potassium: 4.3 mmol/L (ref 3.5–5.1)
Sodium: 134 mmol/L — ABNORMAL LOW (ref 135–145)
Total Bilirubin: 0.8 mg/dL (ref <1.2)
Total Protein: 5.2 g/dL — ABNORMAL LOW (ref 6.5–8.1)

## 2023-03-24 LAB — GLUCOSE, CAPILLARY
Glucose-Capillary: 238 mg/dL — ABNORMAL HIGH (ref 70–99)
Glucose-Capillary: 287 mg/dL — ABNORMAL HIGH (ref 70–99)
Glucose-Capillary: 218 mg/dL — ABNORMAL HIGH (ref 70–99)
Glucose-Capillary: 245 mg/dL — ABNORMAL HIGH (ref 70–99)

## 2023-03-24 LAB — CBC
HCT: 28 % — ABNORMAL LOW (ref 36.0–46.0)
Hemoglobin: 9.1 g/dL — ABNORMAL LOW (ref 12.0–15.0)
MCH: 27.6 pg (ref 26.0–34.0)
MCHC: 32.5 g/dL (ref 30.0–36.0)
MCV: 84.8 fL (ref 80.0–100.0)
Platelets: 134 10*3/uL — ABNORMAL LOW (ref 150–400)
RBC: 3.3 MIL/uL — ABNORMAL LOW (ref 3.87–5.11)
RDW: 14.3 % (ref 11.5–15.5)
WBC: 8 10*3/uL (ref 4.0–10.5)
nRBC: 0 % (ref 0.0–0.2)

## 2023-03-24 LAB — URINE CULTURE

## 2023-03-24 LAB — CK: Total CK: 426 U/L — ABNORMAL HIGH (ref 38–234)

## 2023-03-24 MED ORDER — INSULIN DETEMIR 100 UNIT/ML ~~LOC~~ SOLN
15.0000 [IU] | Freq: Every day | SUBCUTANEOUS | Status: DC
  Administered 2023-03-24: 15 [IU] via SUBCUTANEOUS
  Filled 2023-03-24 (×2): qty 0.15

## 2023-03-24 MED ORDER — NAPROXEN 250 MG PO TABS
500.0000 mg | ORAL_TABLET | Freq: Two times a day (BID) | ORAL | Status: DC
  Administered 2023-03-24 – 2023-03-26 (×5): 500 mg via ORAL
  Filled 2023-03-24 (×6): qty 2
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
## 2023-03-24 NOTE — Consult Note (Signed)
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 short-term plan agree with pt on deferral. PT/OT to work on ROM and strength. F/u with Dr. Aundria Rud or Everardo Pacific as discharge. BLE -- Do not see any role for reimaging. I think Kathryn Logan just has multiple contusions that together are causing an outsized inflammatory response. Will schedule NSAID. If primary service thinks her DM is stable enough a short burst of prednisone (e.g. 40mg  x3-5d) would be beneficial.    Freeman Caldron, PA-C Orthopedic Surgery [phone] 03/24/2023, 10:39 AM

## 2023-03-25 DIAGNOSIS — E785 Hyperlipidemia, unspecified: Secondary | ICD-10-CM | POA: Diagnosis not present

## 2023-03-25 DIAGNOSIS — I10 Essential (primary) hypertension: Secondary | ICD-10-CM | POA: Diagnosis not present

## 2023-03-25 DIAGNOSIS — E119 Type 2 diabetes mellitus without complications: Secondary | ICD-10-CM | POA: Diagnosis not present

## 2023-03-25 DIAGNOSIS — E1165 Type 2 diabetes mellitus with hyperglycemia: Secondary | ICD-10-CM | POA: Diagnosis not present

## 2023-03-25 LAB — COMPREHENSIVE METABOLIC PANEL
ALT: 13 U/L (ref 0–44)
AST: 16 U/L (ref 15–41)
Albumin: 2.5 g/dL — ABNORMAL LOW (ref 3.5–5.0)
Alkaline Phosphatase: 58 U/L (ref 38–126)
Anion gap: 8 (ref 5–15)
BUN: 19 mg/dL (ref 8–23)
CO2: 22 mmol/L (ref 22–32)
Calcium: 9 mg/dL (ref 8.9–10.3)
Chloride: 110 mmol/L (ref 98–111)
Creatinine, Ser: 1.14 mg/dL — ABNORMAL HIGH (ref 0.44–1.00)
GFR, Estimated: 50 mL/min — ABNORMAL LOW (ref >60)
Glucose, Bld: 159 mg/dL — ABNORMAL HIGH (ref 70–99)
Potassium: 4.3 mmol/L (ref 3.5–5.1)
Sodium: 140 mmol/L (ref 135–145)
Total Bilirubin: 0.7 mg/dL (ref <1.2)
Total Protein: 5.4 g/dL — ABNORMAL LOW (ref 6.5–8.1)

## 2023-03-25 LAB — CBC
HCT: 27.5 % — ABNORMAL LOW (ref 36.0–46.0)
Hemoglobin: 9 g/dL — ABNORMAL LOW (ref 12.0–15.0)
MCH: 27.9 pg (ref 26.0–34.0)
MCHC: 32.7 g/dL (ref 30.0–36.0)
MCV: 85.1 fL (ref 80.0–100.0)
Platelets: 147 10*3/uL — ABNORMAL LOW (ref 150–400)
RBC: 3.23 MIL/uL — ABNORMAL LOW (ref 3.87–5.11)
RDW: 14.3 % (ref 11.5–15.5)
WBC: 6.8 10*3/uL (ref 4.0–10.5)
nRBC: 0 % (ref 0.0–0.2)

## 2023-03-25 LAB — GLUCOSE, CAPILLARY
Glucose-Capillary: 187 mg/dL — ABNORMAL HIGH (ref 70–99)
Glucose-Capillary: 183 mg/dL — ABNORMAL HIGH (ref 70–99)
Glucose-Capillary: 207 mg/dL — ABNORMAL HIGH (ref 70–99)
Glucose-Capillary: 278 mg/dL — ABNORMAL HIGH (ref 70–99)

## 2023-03-25 LAB — CK: Total CK: 501 U/L — ABNORMAL HIGH (ref 38–234)

## 2023-03-25 MED ORDER — PANTOPRAZOLE SODIUM 40 MG PO TBEC
40.0000 mg | DELAYED_RELEASE_TABLET | Freq: Every day | ORAL | Status: DC
  Administered 2023-03-25 – 2023-03-28 (×4): 40 mg via ORAL
  Filled 2023-03-25 (×4): qty 1

## 2023-03-25 MED ORDER — INSULIN DETEMIR 100 UNIT/ML ~~LOC~~ SOLN
18.0000 [IU] | Freq: Every day | SUBCUTANEOUS | Status: DC
  Administered 2023-03-25: 18 [IU] via SUBCUTANEOUS
  Filled 2023-03-25 (×2): qty 0.18

## 2023-03-25 NOTE — Plan of Care (Signed)
  Problem: Coping: Goal: Ability to adjust to condition or change in health will improve Outcome: Progressing   Problem: Health Behavior/Discharge Planning: Goal: Ability to manage health-related needs will improve Outcome: Progressing   Problem: Clinical Measurements: Goal: Will remain free from infection Outcome: Progressing   Problem: Activity: Goal: Risk for activity intolerance will decrease Outcome: Progressing

## 2023-03-25 NOTE — Plan of Care (Signed)
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
## 2023-03-26 ENCOUNTER — Inpatient Hospital Stay (HOSPITAL_COMMUNITY): Payer: No Typology Code available for payment source

## 2023-03-26 DIAGNOSIS — E119 Type 2 diabetes mellitus without complications: Secondary | ICD-10-CM | POA: Diagnosis not present

## 2023-03-26 DIAGNOSIS — E1165 Type 2 diabetes mellitus with hyperglycemia: Secondary | ICD-10-CM | POA: Diagnosis not present

## 2023-03-26 DIAGNOSIS — E785 Hyperlipidemia, unspecified: Secondary | ICD-10-CM | POA: Diagnosis not present

## 2023-03-26 DIAGNOSIS — I10 Essential (primary) hypertension: Secondary | ICD-10-CM | POA: Diagnosis not present

## 2023-03-26 LAB — GLUCOSE, CAPILLARY
Glucose-Capillary: 322 mg/dL — ABNORMAL HIGH (ref 70–99)
Glucose-Capillary: 374 mg/dL — ABNORMAL HIGH (ref 70–99)
Glucose-Capillary: 231 mg/dL — ABNORMAL HIGH (ref 70–99)
Glucose-Capillary: 185 mg/dL — ABNORMAL HIGH (ref 70–99)

## 2023-03-26 LAB — COMPREHENSIVE METABOLIC PANEL
ALT: 15 U/L (ref 0–44)
AST: 22 U/L (ref 15–41)
Albumin: 2.4 g/dL — ABNORMAL LOW (ref 3.5–5.0)
Alkaline Phosphatase: 61 U/L (ref 38–126)
Anion gap: 7 (ref 5–15)
BUN: 27 mg/dL — ABNORMAL HIGH (ref 8–23)
CO2: 18 mmol/L — ABNORMAL LOW (ref 22–32)
Calcium: 8.6 mg/dL — ABNORMAL LOW (ref 8.9–10.3)
Chloride: 109 mmol/L (ref 98–111)
Creatinine, Ser: 1.32 mg/dL — ABNORMAL HIGH (ref 0.44–1.00)
GFR, Estimated: 42 mL/min — ABNORMAL LOW (ref >60)
Glucose, Bld: 354 mg/dL — ABNORMAL HIGH (ref 70–99)
Potassium: 4.6 mmol/L (ref 3.5–5.1)
Sodium: 134 mmol/L — ABNORMAL LOW (ref 135–145)
Total Bilirubin: 0.7 mg/dL (ref <1.2)
Total Protein: 5.1 g/dL — ABNORMAL LOW (ref 6.5–8.1)

## 2023-03-26 LAB — CK: Total CK: 459 U/L — ABNORMAL HIGH (ref 38–234)

## 2023-03-26 MED ORDER — INSULIN DETEMIR 100 UNIT/ML ~~LOC~~ SOLN
24.0000 [IU] | Freq: Every day | SUBCUTANEOUS | Status: DC
  Administered 2023-03-26: 24 [IU] via SUBCUTANEOUS
  Filled 2023-03-26 (×2): qty 0.24

## 2023-03-26 MED ORDER — INSULIN ASPART 100 UNIT/ML IJ SOLN
4.0000 [IU] | Freq: Three times a day (TID) | INTRAMUSCULAR | Status: DC
  Administered 2023-03-26 – 2023-03-27 (×6): 4 [IU] via SUBCUTANEOUS

## 2023-03-26 MED ORDER — DIPHENHYDRAMINE HCL 25 MG PO CAPS
25.0000 mg | ORAL_CAPSULE | Freq: Once | ORAL | Status: AC
  Administered 2023-03-26: 25 mg via ORAL
  Filled 2023-03-26: qty 1

## 2023-03-26 NOTE — Progress Notes (Signed)
PT Cancellation Note  Patient Details Name: Kathryn Logan MRN: [id number] DOB: 10-11-1945   Cancelled Treatment:    Reason Eval/Treat Not Completed: (P) Patient declined, no reason specified (Pt reporting desire to participate in therapy today,however requests to eat at this time and reports she will then go for an MRI. Will follow up as schedule allows.)   Johny Shock 03/26/2023, 1:10 PM

## 2023-03-26 NOTE — TOC Progression Note (Addendum)
Transition of Care [facility]) - Progression Note    Patient Details  Name: Kathryn Logan MRN: [id number] Date of Birth: [birth year]  [facility] CM/SW Contact  Mearl Latin, LCSW Phone Number: 03/26/2023, 12:51 PM  Clinical Narrative:    12:51 PM-Patient out of room at this time. Will return to discuss discharge plan and provide SNF bed offers.   4:10 PM-CSW met with patient and provided SNF bed offers. She has selected [facility] as that is where her husband did his rehab and they really enjoyed it.      Barriers to Discharge: Continued Medical Work up  Expected Discharge Plan and Services In-house Referral: Clinical Social Work   Post Acute Care Choice: Home Health, Skilled Nursing Facility Living arrangements for the past 2 months: Single Family Home                                       Social Determinants of Health (SDOH) Interventions SDOH Screenings   Food Insecurity: No Food Insecurity (03/23/2023)  Housing: Low Risk  (03/23/2023)  Transportation Needs: No Transportation Needs (03/23/2023)  Utilities: Not At Risk (03/23/2023)  Alcohol Screen: Low Risk  (10/02/2022)  Depression (PHQ2-9): Low Risk  (10/02/2022)  Financial Resource Strain: Low Risk  (10/02/2022)  Physical Activity: Insufficiently Active (10/02/2022)  Social Connections: Moderately Integrated (10/02/2022)  Stress: No Stress Concern Present (10/02/2022)  Tobacco Use: Low Risk  (03/22/2023)    Readmission Risk Interventions     No data to display
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
## 2023-03-26 NOTE — Plan of Care (Signed)
  Problem: Coping: Goal: Ability to adjust to condition or change in health will improve Outcome: Progressing   Problem: Health Behavior/Discharge Planning: Goal: Ability to manage health-related needs will improve Outcome: Progressing   Problem: Activity: Goal: Risk for activity intolerance will decrease Outcome: Progressing   Problem: Pain Management: Goal: General experience of comfort will improve Outcome: Progressing

## 2023-03-27 ENCOUNTER — Ambulatory Visit: Payer: Medicare Other | Admitting: Internal Medicine

## 2023-03-27 DIAGNOSIS — E785 Hyperlipidemia, unspecified: Secondary | ICD-10-CM | POA: Diagnosis not present

## 2023-03-27 DIAGNOSIS — E1165 Type 2 diabetes mellitus with hyperglycemia: Secondary | ICD-10-CM | POA: Diagnosis not present

## 2023-03-27 DIAGNOSIS — E119 Type 2 diabetes mellitus without complications: Secondary | ICD-10-CM | POA: Diagnosis not present

## 2023-03-27 DIAGNOSIS — I10 Essential (primary) hypertension: Secondary | ICD-10-CM | POA: Diagnosis not present

## 2023-03-27 LAB — GLUCOSE, CAPILLARY
Glucose-Capillary: 198 mg/dL — ABNORMAL HIGH (ref 70–99)
Glucose-Capillary: 285 mg/dL — ABNORMAL HIGH (ref 70–99)
Glucose-Capillary: 297 mg/dL — ABNORMAL HIGH (ref 70–99)
Glucose-Capillary: 292 mg/dL — ABNORMAL HIGH (ref 70–99)

## 2023-03-27 LAB — COMPREHENSIVE METABOLIC PANEL
ALT: 18 U/L (ref 0–44)
AST: 25 U/L (ref 15–41)
Albumin: 2.5 g/dL — ABNORMAL LOW (ref 3.5–5.0)
Alkaline Phosphatase: 71 U/L (ref 38–126)
Anion gap: 8 (ref 5–15)
BUN: 30 mg/dL — ABNORMAL HIGH (ref 8–23)
CO2: 20 mmol/L — ABNORMAL LOW (ref 22–32)
Calcium: 8.9 mg/dL (ref 8.9–10.3)
Chloride: 109 mmol/L (ref 98–111)
Creatinine, Ser: 1.43 mg/dL — ABNORMAL HIGH (ref 0.44–1.00)
GFR, Estimated: 38 mL/min — ABNORMAL LOW (ref >60)
Glucose, Bld: 180 mg/dL — ABNORMAL HIGH (ref 70–99)
Potassium: 4.3 mmol/L (ref 3.5–5.1)
Sodium: 137 mmol/L (ref 135–145)
Total Bilirubin: 0.9 mg/dL (ref <1.2)
Total Protein: 5.4 g/dL — ABNORMAL LOW (ref 6.5–8.1)

## 2023-03-27 LAB — CK: Total CK: 329 U/L — ABNORMAL HIGH (ref 38–234)

## 2023-03-27 MED ORDER — ENOXAPARIN SODIUM 30 MG/0.3ML IJ SOSY: 30.0000 mg | PREFILLED_SYRINGE | INTRAMUSCULAR | Status: DC

## 2023-03-27 MED ORDER — INSULIN DETEMIR 100 UNIT/ML ~~LOC~~ SOLN
28.0000 [IU] | Freq: Every day | SUBCUTANEOUS | Status: DC
  Administered 2023-03-27: 28 [IU] via SUBCUTANEOUS
  Filled 2023-03-27 (×2): qty 0.28

## 2023-03-27 MED ORDER — ACETAMINOPHEN 500 MG PO TABS
1000.0000 mg | ORAL_TABLET | Freq: Three times a day (TID) | ORAL | Status: DC
  Administered 2023-03-27 – 2023-03-28 (×4): 1000 mg via ORAL
  Filled 2023-03-27 (×4): qty 2

## 2023-03-27 MED ORDER — SODIUM CHLORIDE 0.9 % IV SOLN: INTRAVENOUS | Status: AC
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
## 2023-03-27 NOTE — Plan of Care (Signed)
  Problem: Coping: Goal: Ability to adjust to condition or change in health will improve Outcome: Progressing   Problem: Health Behavior/Discharge Planning: Goal: Ability to manage health-related needs will improve Outcome: Progressing   Problem: Skin Integrity: Goal: Risk for impaired skin integrity will decrease Outcome: Progressing   Problem: Activity: Goal: Risk for activity intolerance will decrease Outcome: Progressing   Problem: Coping: Goal: Level of anxiety will decrease Outcome: Progressing

## 2023-03-27 NOTE — Plan of Care (Signed)
Problem: Education: Goal: Ability to describe self-care measures that may prevent or decrease complications (Diabetes Survival Skills Education) will improve 03/27/2023 1842 by Elwanda Brooklyn, RN Outcome: Progressing 03/27/2023 1842 by Elwanda Brooklyn, RN Outcome: Progressing Goal: Individualized Educational Video(s) 03/27/2023 1842 by Elwanda Brooklyn, RN Outcome: Progressing 03/27/2023 1842 by Elwanda Brooklyn, RN Outcome: Progressing   Problem: Coping: Goal: Ability to adjust to condition or change in health will improve 03/27/2023 1842 by Elwanda Brooklyn, RN Outcome: Progressing 03/27/2023 1842 by Elwanda Brooklyn, RN Outcome: Progressing   Problem: Fluid Volume: Goal: Ability to maintain a balanced intake and output will improve 03/27/2023 1842 by Elwanda Brooklyn, RN Outcome: Progressing 03/27/2023 1842 by Elwanda Brooklyn, RN Outcome: Progressing   Problem: Health Behavior/Discharge Planning: Goal: Ability to identify and utilize available resources and services will improve 03/27/2023 1842 by Elwanda Brooklyn, RN Outcome: Progressing 03/27/2023 1842 by Elwanda Brooklyn, RN Outcome: Progressing Goal: Ability to manage health-related needs will improve 03/27/2023 1842 by Elwanda Brooklyn, RN Outcome: Progressing 03/27/2023 1842 by Elwanda Brooklyn, RN Outcome: Progressing   Problem: Metabolic: Goal: Ability to maintain appropriate glucose levels will improve 03/27/2023 1842 by Elwanda Brooklyn, RN Outcome: Progressing 03/27/2023 1842 by Elwanda Brooklyn, RN Outcome: Progressing   Problem: Nutritional: Goal: Maintenance of adequate nutrition will improve 03/27/2023 1842 by Elwanda Brooklyn, RN Outcome: Progressing 03/27/2023 1842 by Elwanda Brooklyn, RN Outcome: Progressing Goal: Progress toward achieving an optimal weight will improve 03/27/2023 1842 by Elwanda Brooklyn, RN Outcome: Progressing 03/27/2023 1842 by Elwanda Brooklyn, RN Outcome: Progressing   Problem: Skin  Integrity: Goal: Risk for impaired skin integrity will decrease 03/27/2023 1842 by Elwanda Brooklyn, RN Outcome: Progressing 03/27/2023 1842 by Elwanda Brooklyn, RN Outcome: Progressing   Problem: Tissue Perfusion: Goal: Adequacy of tissue perfusion will improve 03/27/2023 1842 by Elwanda Brooklyn, RN Outcome: Progressing 03/27/2023 1842 by Elwanda Brooklyn, RN Outcome: Progressing   Problem: Education: Goal: Knowledge of General Education information will improve Description: Including pain rating scale, medication(s)/side effects and non-pharmacologic comfort measures 03/27/2023 1842 by Elwanda Brooklyn, RN Outcome: Progressing 03/27/2023 1842 by Elwanda Brooklyn, RN Outcome: Progressing   Problem: Health Behavior/Discharge Planning: Goal: Ability to manage health-related needs will improve 03/27/2023 1842 by Elwanda Brooklyn, RN Outcome: Progressing 03/27/2023 1842 by Elwanda Brooklyn, RN Outcome: Progressing   Problem: Clinical Measurements: Goal: Ability to maintain clinical measurements within normal limits will improve 03/27/2023 1842 by Elwanda Brooklyn, RN Outcome: Progressing 03/27/2023 1842 by Elwanda Brooklyn, RN Outcome: Progressing Goal: Will remain free from infection 03/27/2023 1842 by Elwanda Brooklyn, RN Outcome: Progressing 03/27/2023 1842 by Elwanda Brooklyn, RN Outcome: Progressing Goal: Diagnostic test results will improve 03/27/2023 1842 by Elwanda Brooklyn, RN Outcome: Progressing 03/27/2023 1842 by Elwanda Brooklyn, RN Outcome: Progressing Goal: Respiratory complications will improve 03/27/2023 1842 by Elwanda Brooklyn, RN Outcome: Progressing 03/27/2023 1842 by Elwanda Brooklyn, RN Outcome: Progressing Goal: Cardiovascular complication will be avoided 03/27/2023 1842 by Elwanda Brooklyn, RN Outcome: Progressing 03/27/2023 1842 by Elwanda Brooklyn, RN Outcome: Progressing   Problem: Activity: Goal: Risk for activity intolerance will decrease 03/27/2023 1842 by Elwanda Brooklyn, RN Outcome: Progressing 03/27/2023 1842 by Elwanda Brooklyn, RN Outcome: Progressing   Problem: Nutrition: Goal: Adequate nutrition will be maintained 03/27/2023 1842 by Elwanda Brooklyn, RN Outcome: Progressing 03/27/2023 1842 by Elwanda Brooklyn, RN Outcome: Progressing  Problem: Coping: Goal: Level of anxiety will decrease 03/27/2023 1842 by Elwanda Brooklyn, RN Outcome: Progressing 03/27/2023 1842 by Elwanda Brooklyn, RN Outcome: Progressing   Problem: Elimination: Goal: Will not experience complications related to bowel motility 03/27/2023 1842 by Elwanda Brooklyn, RN Outcome: Progressing 03/27/2023 1842 by Elwanda Brooklyn, RN Outcome: Progressing Goal: Will not experience complications related to urinary retention 03/27/2023 1842 by Elwanda Brooklyn, RN Outcome: Progressing 03/27/2023 1842 by Elwanda Brooklyn, RN Outcome: Progressing   Problem: Pain Management: Goal: General experience of comfort will improve 03/27/2023 1842 by Elwanda Brooklyn, RN Outcome: Progressing 03/27/2023 1842 by Elwanda Brooklyn, RN Outcome: Progressing   Problem: Safety: Goal: Ability to remain free from injury will improve 03/27/2023 1842 by Elwanda Brooklyn, RN Outcome: Progressing 03/27/2023 1842 by Elwanda Brooklyn, RN Outcome: Progressing   Problem: Skin Integrity: Goal: Risk for impaired skin integrity will decrease 03/27/2023 1842 by Elwanda Brooklyn, RN Outcome: Progressing 03/27/2023 1842 by Elwanda Brooklyn, RN Outcome: Progressing

## 2023-03-27 NOTE — Care Management Important Message (Signed)
Important Message  Patient Details  Name: Kathryn Logan MRN: [id number] Date of Birth: 04-14-1946   Important Message Given:  Yes - Medicare IM     Dorena Bodo 03/27/2023, 3:20 PM

## 2023-03-27 NOTE — Progress Notes (Signed)
Mobility Specialist Progress Note;   03/27/23 1410  Mobility  Activity Stood at bedside (Sit to stand from chair)  Level of Assistance Contact guard assist, steadying assist  Assistive Device Front wheel walker  Activity Response Tolerated well  Mobility Referral Yes  $Mobility charge 1 Mobility  Mobility Specialist Start Time (ACUTE ONLY) 1410  Mobility Specialist Stop Time (ACUTE ONLY) 1425  Mobility Specialist Time Calculation (min) (ACUTE ONLY) 15 min   Pt agreeable to mobility. Performed 5x STS from chair requiring minG assistance. RUE NWB throughout session. Attempted ambulating a few steps forward from chair but pain in RLE prohibited progression. Pt determined to do as much as she is able to with each session. Displayed some fatigue at EOS. Pt back in chair with all needs met.   Caesar Bookman Mobility Specialist Please contact via SecureChat or Rehab Office [phone]
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
## 2023-03-27 NOTE — Plan of Care (Signed)

## 2023-03-27 NOTE — Progress Notes (Signed)
Orthopedic Tech Progress Note Patient Details:  Kathryn Logan 09-20-1945 [id number]  Ortho Devices Type of Ortho Device: Shoulder immobilizer Ortho Device/Splint Location: RUE Ortho Device/Splint Interventions: Ordered, Application, Adjustment   Post Interventions Patient Tolerated: Well Instructions Provided: Care of device, Adjustment of device  Sherilyn Banker 03/27/2023, 3:40 PM

## 2023-03-28 DIAGNOSIS — M4856XD Collapsed vertebra, not elsewhere classified, lumbar region, subsequent encounter for fracture with routine healing: Secondary | ICD-10-CM | POA: Diagnosis not present

## 2023-03-28 DIAGNOSIS — Z7401 Bed confinement status: Secondary | ICD-10-CM | POA: Diagnosis not present

## 2023-03-28 DIAGNOSIS — F331 Major depressive disorder, recurrent, moderate: Secondary | ICD-10-CM | POA: Diagnosis not present

## 2023-03-28 DIAGNOSIS — R5381 Other malaise: Secondary | ICD-10-CM | POA: Diagnosis not present

## 2023-03-28 DIAGNOSIS — F418 Other specified anxiety disorders: Secondary | ICD-10-CM | POA: Diagnosis not present

## 2023-03-28 DIAGNOSIS — S42111D Displaced fracture of body of scapula, right shoulder, subsequent encounter for fracture with routine healing: Secondary | ICD-10-CM | POA: Diagnosis not present

## 2023-03-28 DIAGNOSIS — R0689 Other abnormalities of breathing: Secondary | ICD-10-CM | POA: Diagnosis not present

## 2023-03-28 DIAGNOSIS — F4320 Adjustment disorder, unspecified: Secondary | ICD-10-CM | POA: Diagnosis not present

## 2023-03-28 DIAGNOSIS — T07XXXA Unspecified multiple injuries, initial encounter: Secondary | ICD-10-CM

## 2023-03-28 DIAGNOSIS — S42101A Fracture of unspecified part of scapula, right shoulder, initial encounter for closed fracture: Secondary | ICD-10-CM | POA: Diagnosis not present

## 2023-03-28 DIAGNOSIS — S32010A Wedge compression fracture of first lumbar vertebra, initial encounter for closed fracture: Secondary | ICD-10-CM | POA: Diagnosis not present

## 2023-03-28 DIAGNOSIS — I700 Atherosclerosis of aorta: Secondary | ICD-10-CM | POA: Diagnosis not present

## 2023-03-28 DIAGNOSIS — F411 Generalized anxiety disorder: Secondary | ICD-10-CM | POA: Diagnosis not present

## 2023-03-28 DIAGNOSIS — W19XXXA Unspecified fall, initial encounter: Secondary | ICD-10-CM | POA: Diagnosis not present

## 2023-03-28 DIAGNOSIS — R4189 Other symptoms and signs involving cognitive functions and awareness: Secondary | ICD-10-CM | POA: Diagnosis not present

## 2023-03-28 DIAGNOSIS — K219 Gastro-esophageal reflux disease without esophagitis: Secondary | ICD-10-CM | POA: Diagnosis not present

## 2023-03-28 DIAGNOSIS — E785 Hyperlipidemia, unspecified: Secondary | ICD-10-CM | POA: Diagnosis present

## 2023-03-28 DIAGNOSIS — K5900 Constipation, unspecified: Secondary | ICD-10-CM | POA: Diagnosis not present

## 2023-03-28 DIAGNOSIS — I25810 Atherosclerosis of coronary artery bypass graft(s) without angina pectoris: Secondary | ICD-10-CM | POA: Diagnosis not present

## 2023-03-28 DIAGNOSIS — E559 Vitamin D deficiency, unspecified: Secondary | ICD-10-CM | POA: Diagnosis present

## 2023-03-28 DIAGNOSIS — E119 Type 2 diabetes mellitus without complications: Secondary | ICD-10-CM | POA: Diagnosis not present

## 2023-03-28 DIAGNOSIS — F419 Anxiety disorder, unspecified: Secondary | ICD-10-CM | POA: Diagnosis present

## 2023-03-28 DIAGNOSIS — E038 Other specified hypothyroidism: Secondary | ICD-10-CM | POA: Diagnosis present

## 2023-03-28 DIAGNOSIS — S32010D Wedge compression fracture of first lumbar vertebra, subsequent encounter for fracture with routine healing: Secondary | ICD-10-CM | POA: Diagnosis not present

## 2023-03-28 DIAGNOSIS — M8580 Other specified disorders of bone density and structure, unspecified site: Secondary | ICD-10-CM | POA: Diagnosis not present

## 2023-03-28 DIAGNOSIS — M6281 Muscle weakness (generalized): Secondary | ICD-10-CM | POA: Diagnosis not present

## 2023-03-28 DIAGNOSIS — N179 Acute kidney failure, unspecified: Secondary | ICD-10-CM | POA: Diagnosis present

## 2023-03-28 DIAGNOSIS — Z8679 Personal history of other diseases of the circulatory system: Secondary | ICD-10-CM | POA: Diagnosis not present

## 2023-03-28 DIAGNOSIS — I2510 Atherosclerotic heart disease of native coronary artery without angina pectoris: Secondary | ICD-10-CM | POA: Diagnosis not present

## 2023-03-28 DIAGNOSIS — N183 Chronic kidney disease, stage 3 unspecified: Secondary | ICD-10-CM | POA: Diagnosis not present

## 2023-03-28 DIAGNOSIS — Z741 Need for assistance with personal care: Secondary | ICD-10-CM | POA: Diagnosis not present

## 2023-03-28 DIAGNOSIS — E1165 Type 2 diabetes mellitus with hyperglycemia: Secondary | ICD-10-CM | POA: Diagnosis not present

## 2023-03-28 DIAGNOSIS — I10 Essential (primary) hypertension: Secondary | ICD-10-CM | POA: Diagnosis present

## 2023-03-28 DIAGNOSIS — R55 Syncope and collapse: Secondary | ICD-10-CM | POA: Diagnosis present

## 2023-03-28 DIAGNOSIS — F32A Depression, unspecified: Secondary | ICD-10-CM | POA: Diagnosis present

## 2023-03-28 DIAGNOSIS — M25511 Pain in right shoulder: Secondary | ICD-10-CM | POA: Diagnosis not present

## 2023-03-28 LAB — GLUCOSE, CAPILLARY
Glucose-Capillary: 168 mg/dL — ABNORMAL HIGH (ref 70–99)
Glucose-Capillary: 181 mg/dL — ABNORMAL HIGH (ref 70–99)

## 2023-03-28 LAB — BASIC METABOLIC PANEL
Anion gap: 9 (ref 5–15)
BUN: 37 mg/dL — ABNORMAL HIGH (ref 8–23)
CO2: 18 mmol/L — ABNORMAL LOW (ref 22–32)
Calcium: 9.2 mg/dL (ref 8.9–10.3)
Chloride: 110 mmol/L (ref 98–111)
Creatinine, Ser: 1.33 mg/dL — ABNORMAL HIGH (ref 0.44–1.00)
GFR, Estimated: 42 mL/min — ABNORMAL LOW (ref >60)
Glucose, Bld: 200 mg/dL — ABNORMAL HIGH (ref 70–99)
Potassium: 4.8 mmol/L (ref 3.5–5.1)
Sodium: 137 mmol/L (ref 135–145)

## 2023-03-28 MED ORDER — INSULIN ASPART 100 UNIT/ML IJ SOLN
6.0000 [IU] | Freq: Three times a day (TID) | INTRAMUSCULAR | Status: DC
  Administered 2023-03-28 (×2): 6 [IU] via SUBCUTANEOUS

## 2023-03-28 MED ORDER — POLYETHYLENE GLYCOL 3350 17 G PO PACK
17.0000 g | PACK | Freq: Every day | ORAL | 0 refills | Status: DC
Start: 2023-03-28 — End: 2023-04-29

## 2023-03-28 MED ORDER — BISACODYL 10 MG RE SUPP
10.0000 mg | Freq: Once | RECTAL | Status: AC
  Administered 2023-03-28: 10 mg via RECTAL
  Filled 2023-03-28: qty 1

## 2023-03-28 MED ORDER — NOVOLOG FLEXPEN 100 UNIT/ML ~~LOC~~ SOPN: PEN_INJECTOR | SUBCUTANEOUS | Status: DC

## 2023-03-28 MED ORDER — SENNOSIDES-DOCUSATE SODIUM 8.6-50 MG PO TABS: 1.0000 | ORAL_TABLET | Freq: Every day | ORAL | Status: DC

## 2023-03-28 MED ORDER — INSULIN DETEMIR 100 UNIT/ML ~~LOC~~ SOLN
32.0000 [IU] | Freq: Every day | SUBCUTANEOUS | Status: DC
  Administered 2023-03-28: 32 [IU] via SUBCUTANEOUS
  Filled 2023-03-28 (×2): qty 0.32

## 2023-03-28 MED ORDER — INSULIN DETEMIR 100 UNIT/ML ~~LOC~~ SOLN: 32.0000 [IU] | Freq: Every day | SUBCUTANEOUS | Status: DC

## 2023-03-28 MED ORDER — OXYCODONE HCL 5 MG PO TABS: 5.0000 mg | ORAL_TABLET | Freq: Three times a day (TID) | ORAL | 0 refills | Status: DC | PRN

## 2023-03-28 NOTE — Progress Notes (Signed)
Mobility Specialist Progress Note;   03/28/23 1545  Mobility  Activity Transferred to/from [facility]  Level of Assistance Minimal assist, patient does 75% or more  Assistive Device Other (Comment) (HHA)  Activity Response Tolerated well  Mobility Referral Yes  $Mobility charge 1 Mobility  Mobility Specialist Start Time (ACUTE ONLY) 1545  Mobility Specialist Stop Time (ACUTE ONLY) 1600  Mobility Specialist Time Calculation (min) (ACUTE ONLY) 15 min   Pt requesting assistance to [facility]. Required minA via HHA for transfer from chair to [facility]. BM successful, pericare performed by MS. NWB RUE throughout transfer. Pt returned back to chair with all needs met. Family in room.   Caesar Bookman Mobility Specialist Please contact via SecureChat or Rehab Office [phone]

## 2023-03-28 NOTE — Plan of Care (Signed)

## 2023-03-28 NOTE — TOC Transition Note (Signed)
Transition of Care [facility]) - CM/SW Discharge Note   Patient Details  Name: Kathryn Logan MRN: [id number] Date of Birth: 26-Jun-1945  Transition of Care [facility]) CM/SW Contact:  Mearl Latin, LCSW Phone Number: 03/28/2023, 2:02 PM   Clinical Narrative:    Patient will DC to: [facility] Anticipated DC date: 03/28/23 Family notified: Pt notified son Transport by: Sharin Mons   Per MD patient ready for DC to [facility]. RN to call report prior to discharge [phone]). RN, patient, patient's family, and facility notified of DC. Discharge Summary and FL2 sent to facility. DC packet on chart including signed script. Ambulance transport requested for patient.   CSW will sign off for now as social work intervention is no longer needed. Please consult [place] again if new needs arise.     Final next level of care: Skilled Nursing Facility Barriers to Discharge: Barriers Resolved   Patient Goals and CMS Choice CMS Medicare.gov Compare Post Acute Care list provided to:: Patient Choice offered to / list presented to : Patient  Discharge Placement     Existing PASRR number confirmed : 03/28/23          Patient [facility] bed at:  [facility]) Patient to be transferred to facility by: [facility] Name of family member notified: Pt notified son Patient and family notified of of transfer: 03/28/23  Discharge Plan and Services Additional resources added to the After Visit Summary for   In-house Referral: Clinical Social Work   Post Acute Care Choice: Skilled Nursing Facility                               Social Determinants of Health (SDOH) Interventions SDOH Screenings   Food Insecurity: No Food Insecurity (03/23/2023)  Housing: Low Risk  (03/23/2023)  Transportation Needs: No Transportation Needs (03/23/2023)  Utilities: Not At Risk (03/23/2023)  Alcohol Screen: Low Risk  (10/02/2022)  Depression (PHQ2-9): Low Risk  (10/02/2022)  Financial Resource Strain: Low Risk   (10/02/2022)  Physical Activity: Insufficiently Active (10/02/2022)  Social Connections: Moderately Integrated (10/02/2022)  Stress: No Stress Concern Present (10/02/2022)  Tobacco Use: Low Risk  (03/22/2023)     Readmission Risk Interventions     No data to display

## 2023-03-28 NOTE — TOC Progression Note (Signed)
Transition of Care [facility]) - Progression Note    Patient Details  Name: Kathryn Logan MRN: [id number] Date of Birth: 04-29-1946  Transition of Care [facility]) CM/SW Contact  Mearl Latin, LCSW Phone Number: 03/28/2023, 1:03 PM  Clinical Narrative:    Patient in agreement with [facility] for transport to [facility] as she reported not feeling comfortable with her family taking her by car.    Expected Discharge Plan: Skilled Nursing Facility Barriers to Discharge: Barriers Resolved  Expected Discharge Plan and Services In-house Referral: Clinical Social Work   Post Acute Care Choice: Skilled Nursing Facility Living arrangements for the past 2 months: Single Family Home Expected Discharge Date: 03/28/23                                     Social Determinants of Health (SDOH) Interventions SDOH Screenings   Food Insecurity: No Food Insecurity (03/23/2023)  Housing: Low Risk  (03/23/2023)  Transportation Needs: No Transportation Needs (03/23/2023)  Utilities: Not At Risk (03/23/2023)  Alcohol Screen: Low Risk  (10/02/2022)  Depression (PHQ2-9): Low Risk  (10/02/2022)  Financial Resource Strain: Low Risk  (10/02/2022)  Physical Activity: Insufficiently Active (10/02/2022)  Social Connections: Moderately Integrated (10/02/2022)  Stress: No Stress Concern Present (10/02/2022)  Tobacco Use: Low Risk  (03/22/2023)    Readmission Risk Interventions     No data to display

## 2023-03-28 NOTE — Discharge Summary (Signed)
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 tendinosis. 4. Mild intra-articular biceps tendinosis. 5. Mild acromioclavicular and glenohumeral osteoarthritis. Electronically Signed   By: Obie Dredge M.D.   On: 03/27/2023 11:03     PERTINENT LAB RESULTS: CBC: No results for input(s): "WBC", "HGB", "HCT", "PLT" in the last 72 hours. CMET CMP     Component Value Date/Time   NA 137 03/28/2023 0341   K 4.8 03/28/2023 0341   CL 110 03/28/2023 0341   CO2 18 (L) 03/28/2023 0341   GLUCOSE 200 (H) 03/28/2023 0341   BUN 37 (H) 03/28/2023 0341   CREATININE 1.33 (H) 03/28/2023 0341   CALCIUM 9.2 03/28/2023 0341   PROT 5.4 (L) 03/27/2023 0312   ALBUMIN 2.5 (L) 03/27/2023 0312   AST 25 03/27/2023 0312   ALT 18 03/27/2023 0312   ALKPHOS 71 03/27/2023 0312   BILITOT 0.9 03/27/2023 0312   GFR 48.59 (L) 06/01/2021 1150   GFRNONAA 42 (L) 03/28/2023 0341    GFR Estimated Creatinine Clearance: 30.2 mL/min (A) (by C-G  formula based on SCr of 1.33 mg/dL (H)). No results for input(s): "LIPASE", "AMYLASE" in the last 72 hours. Recent Labs    03/26/23 0345 03/27/23 0312  CKTOTAL 459* 329*   Invalid input(s): "POCBNP" No results for input(s): "DDIMER" in the last 72 hours. No results for input(s): "HGBA1C" in the last 72 hours. No results for input(s): "CHOL", "HDL", "LDLCALC", "TRIG", "CHOLHDL", "LDLDIRECT" in the last 72 hours. No results for input(s): "TSH", "T4TOTAL", "T3FREE", "THYROIDAB" in the last 72 hours.  Invalid input(s): "FREET3" No results for input(s): "VITAMINB12", "FOLATE", "FERRITIN", "TIBC", "IRON", "RETICCTPCT" in the last 72 hours. Coags: No results for input(s): "INR" in the last 72 hours.  Invalid input(s): "PT" Microbiology: Recent Results (from the past 240 hour(s))  Urine Culture     Status: Abnormal  Collection Time: 03/22/23  6:55 AM   Specimen: Urine, Clean Catch  Result Value Ref Range Status   Specimen Description URINE, CLEAN CATCH  Final   Special Requests   Final    NONE Performed at [facility], [address], [place], [place] [address]    Culture MULTIPLE SPECIES PRESENT, SUGGEST RECOLLECTION (A)  Final   Report Status 03/24/2023 FINAL  Final    FURTHER DISCHARGE INSTRUCTIONS:  Get Medicines reviewed and adjusted: Please take all your medications with you for your next visit with your Primary MD  Laboratory/radiological data: Please request your Primary MD to go over all hospital tests and procedure/radiological results at the follow up, please ask your Primary MD to get all Hospital records sent to his/her office.  In some cases, they will be blood work, cultures and biopsy results pending at the time of your discharge. Please request that your primary care M.D. goes through all the records of your hospital data and follows up on these results.  Also Note the following: If you experience worsening of your admission symptoms, develop shortness  of breath, life threatening emergency, suicidal or homicidal thoughts you must seek medical attention immediately by calling 911 or calling your MD immediately  if symptoms less severe.  You must read complete instructions/literature along with all the possible adverse reactions/side effects for all the Medicines you take and that have been prescribed to you. Take any new Medicines after you have completely understood and accpet all the possible adverse reactions/side effects.   Do not drive when taking Pain medications or sleeping medications (Benzodaizepines)  Do not take more than prescribed Pain, Sleep and Anxiety Medications. It is not advisable to combine anxiety,sleep and pain medications without talking with your primary care practitioner  Special Instructions: If you have smoked or chewed Tobacco  in the last 2 yrs please stop smoking, stop any regular Alcohol  and or any Recreational drug use.  Wear Seat belts while driving.  Please note: You were cared for by a hospitalist during your hospital stay. Once you are discharged, your primary care physician will handle any further medical issues. Please note that NO REFILLS for any discharge medications will be authorized once you are discharged, as it is imperative that you return to your primary care physician (or establish a relationship with a primary care physician if you do not have one) for your post hospital discharge needs so that they can reassess your need for medications and monitor your lab values.  Total Time spent coordinating discharge including counseling, education and face to face time equals greater than 30 minutes.  SignedJeoffrey Massed 03/28/2023 8:13 AM

## 2023-03-29 ENCOUNTER — Other Ambulatory Visit: Payer: Self-pay | Admitting: Internal Medicine

## 2023-03-29 DIAGNOSIS — M4856XD Collapsed vertebra, not elsewhere classified, lumbar region, subsequent encounter for fracture with routine healing: Secondary | ICD-10-CM | POA: Diagnosis not present

## 2023-03-29 DIAGNOSIS — F411 Generalized anxiety disorder: Secondary | ICD-10-CM | POA: Diagnosis not present

## 2023-03-29 DIAGNOSIS — W19XXXA Unspecified fall, initial encounter: Secondary | ICD-10-CM | POA: Diagnosis not present

## 2023-03-29 DIAGNOSIS — R4189 Other symptoms and signs involving cognitive functions and awareness: Secondary | ICD-10-CM | POA: Diagnosis not present

## 2023-03-29 DIAGNOSIS — N179 Acute kidney failure, unspecified: Secondary | ICD-10-CM | POA: Diagnosis not present

## 2023-03-29 DIAGNOSIS — I10 Essential (primary) hypertension: Secondary | ICD-10-CM | POA: Diagnosis not present

## 2023-03-29 DIAGNOSIS — K219 Gastro-esophageal reflux disease without esophagitis: Secondary | ICD-10-CM | POA: Diagnosis not present

## 2023-03-29 DIAGNOSIS — E119 Type 2 diabetes mellitus without complications: Secondary | ICD-10-CM | POA: Diagnosis not present

## 2023-03-29 DIAGNOSIS — E785 Hyperlipidemia, unspecified: Secondary | ICD-10-CM | POA: Diagnosis not present

## 2023-03-29 DIAGNOSIS — I25810 Atherosclerosis of coronary artery bypass graft(s) without angina pectoris: Secondary | ICD-10-CM | POA: Diagnosis not present

## 2023-03-29 DIAGNOSIS — K5900 Constipation, unspecified: Secondary | ICD-10-CM | POA: Diagnosis not present

## 2023-04-01 ENCOUNTER — Other Ambulatory Visit: Payer: Self-pay | Admitting: Internal Medicine
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
## 2023-04-04 DIAGNOSIS — I10 Essential (primary) hypertension: Secondary | ICD-10-CM | POA: Diagnosis not present

## 2023-04-04 DIAGNOSIS — E119 Type 2 diabetes mellitus without complications: Secondary | ICD-10-CM | POA: Diagnosis not present

## 2023-04-04 DIAGNOSIS — F411 Generalized anxiety disorder: Secondary | ICD-10-CM | POA: Diagnosis not present

## 2023-04-04 DIAGNOSIS — K219 Gastro-esophageal reflux disease without esophagitis: Secondary | ICD-10-CM | POA: Diagnosis not present

## 2023-04-04 DIAGNOSIS — M4856XD Collapsed vertebra, not elsewhere classified, lumbar region, subsequent encounter for fracture with routine healing: Secondary | ICD-10-CM | POA: Diagnosis not present

## 2023-04-05 DIAGNOSIS — S42101A Fracture of unspecified part of scapula, right shoulder, initial encounter for closed fracture: Secondary | ICD-10-CM | POA: Diagnosis not present

## 2023-04-05 DIAGNOSIS — I2510 Atherosclerotic heart disease of native coronary artery without angina pectoris: Secondary | ICD-10-CM | POA: Diagnosis not present

## 2023-04-05 DIAGNOSIS — R5381 Other malaise: Secondary | ICD-10-CM | POA: Diagnosis not present

## 2023-04-05 DIAGNOSIS — F419 Anxiety disorder, unspecified: Secondary | ICD-10-CM | POA: Diagnosis not present

## 2023-04-05 DIAGNOSIS — E1165 Type 2 diabetes mellitus with hyperglycemia: Secondary | ICD-10-CM | POA: Diagnosis not present

## 2023-04-11 ENCOUNTER — Ambulatory Visit: Payer: Medicare Other | Admitting: Adult Health

## 2023-04-16 DIAGNOSIS — M4856XD Collapsed vertebra, not elsewhere classified, lumbar region, subsequent encounter for fracture with routine healing: Secondary | ICD-10-CM | POA: Diagnosis not present

## 2023-04-16 DIAGNOSIS — K219 Gastro-esophageal reflux disease without esophagitis: Secondary | ICD-10-CM | POA: Diagnosis not present

## 2023-04-16 DIAGNOSIS — I10 Essential (primary) hypertension: Secondary | ICD-10-CM | POA: Diagnosis not present

## 2023-04-16 DIAGNOSIS — E119 Type 2 diabetes mellitus without complications: Secondary | ICD-10-CM | POA: Diagnosis not present

## 2023-04-16 DIAGNOSIS — F411 Generalized anxiety disorder: Secondary | ICD-10-CM | POA: Diagnosis not present

## 2023-04-23 ENCOUNTER — Encounter: Payer: Self-pay | Admitting: Internal Medicine

## 2023-04-23 DIAGNOSIS — K219 Gastro-esophageal reflux disease without esophagitis: Secondary | ICD-10-CM | POA: Diagnosis not present

## 2023-04-23 DIAGNOSIS — M4856XD Collapsed vertebra, not elsewhere classified, lumbar region, subsequent encounter for fracture with routine healing: Secondary | ICD-10-CM | POA: Diagnosis not present

## 2023-04-23 DIAGNOSIS — I10 Essential (primary) hypertension: Secondary | ICD-10-CM | POA: Diagnosis not present

## 2023-04-23 DIAGNOSIS — E119 Type 2 diabetes mellitus without complications: Secondary | ICD-10-CM | POA: Diagnosis not present

## 2023-04-24 DIAGNOSIS — M25511 Pain in right shoulder: Secondary | ICD-10-CM | POA: Diagnosis not present

## 2023-04-25 NOTE — Telephone Encounter (Signed)
Patient called to follow up on this request. Best callback is [phone].

## 2023-04-26 ENCOUNTER — Encounter: Payer: Self-pay | Admitting: Internal Medicine
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
## 2023-04-26 NOTE — Patient Instructions (Signed)
      Blood work was ordered.       Medications changes include :   decrease lasix to 20 mg daily and decrease potassium to 20 mg daily   A referral was ordered for the heart failure clinic.      Return in about 2 months (around 06/03/2023) for follow up.

## 2023-04-27 ENCOUNTER — Encounter: Payer: Medicare Other | Admitting: Internal Medicine

## 2023-04-27 DIAGNOSIS — E119 Type 2 diabetes mellitus without complications: Secondary | ICD-10-CM

## 2023-04-27 DIAGNOSIS — N1831 Chronic kidney disease, stage 3a: Secondary | ICD-10-CM

## 2023-04-27 DIAGNOSIS — E785 Hyperlipidemia, unspecified: Secondary | ICD-10-CM

## 2023-04-27 DIAGNOSIS — I700 Atherosclerosis of aorta: Secondary | ICD-10-CM

## 2023-04-27 DIAGNOSIS — K219 Gastro-esophageal reflux disease without esophagitis: Secondary | ICD-10-CM

## 2023-04-27 DIAGNOSIS — W19XXXA Unspecified fall, initial encounter: Secondary | ICD-10-CM

## 2023-04-27 DIAGNOSIS — E038 Other specified hypothyroidism: Secondary | ICD-10-CM

## 2023-04-27 DIAGNOSIS — I10 Essential (primary) hypertension: Secondary | ICD-10-CM

## 2023-04-27 DIAGNOSIS — I25119 Atherosclerotic heart disease of native coronary artery with unspecified angina pectoris: Secondary | ICD-10-CM

## 2023-04-28 ENCOUNTER — Other Ambulatory Visit: Payer: Self-pay

## 2023-04-28 ENCOUNTER — Inpatient Hospital Stay (HOSPITAL_COMMUNITY)
Admission: EM | Admit: 2023-04-28 | Discharge: 2023-04-30 | DRG: 637 | Disposition: A | Discharge status: Home / Self Care | Payer: Medicare Other | Attending: Family Medicine | Admitting: Family Medicine

## 2023-04-28 ENCOUNTER — Encounter (HOSPITAL_COMMUNITY): Payer: Self-pay

## 2023-04-28 ENCOUNTER — Emergency Department (HOSPITAL_COMMUNITY): Payer: Medicare Other

## 2023-04-28 DIAGNOSIS — K219 Gastro-esophageal reflux disease without esophagitis: Secondary | ICD-10-CM | POA: Diagnosis not present

## 2023-04-28 DIAGNOSIS — E1159 Type 2 diabetes mellitus with other circulatory complications: Secondary | ICD-10-CM | POA: Diagnosis present

## 2023-04-28 DIAGNOSIS — E11649 Type 2 diabetes mellitus with hypoglycemia without coma: Secondary | ICD-10-CM | POA: Diagnosis not present

## 2023-04-28 DIAGNOSIS — N179 Acute kidney failure, unspecified: Secondary | ICD-10-CM | POA: Diagnosis present

## 2023-04-28 DIAGNOSIS — R739 Hyperglycemia, unspecified: Secondary | ICD-10-CM | POA: Diagnosis not present

## 2023-04-28 DIAGNOSIS — D649 Anemia, unspecified: Secondary | ICD-10-CM | POA: Diagnosis present

## 2023-04-28 DIAGNOSIS — Z823 Family history of stroke: Secondary | ICD-10-CM | POA: Diagnosis not present

## 2023-04-28 DIAGNOSIS — Z79899 Other long term (current) drug therapy: Secondary | ICD-10-CM

## 2023-04-28 DIAGNOSIS — R4182 Altered mental status, unspecified: Secondary | ICD-10-CM | POA: Diagnosis not present

## 2023-04-28 DIAGNOSIS — Z820 Family history of epilepsy and other diseases of the nervous system: Secondary | ICD-10-CM | POA: Diagnosis not present

## 2023-04-28 DIAGNOSIS — E8721 Acute metabolic acidosis: Secondary | ICD-10-CM | POA: Diagnosis present

## 2023-04-28 DIAGNOSIS — I2510 Atherosclerotic heart disease of native coronary artery without angina pectoris: Secondary | ICD-10-CM | POA: Diagnosis present

## 2023-04-28 DIAGNOSIS — D696 Thrombocytopenia, unspecified: Secondary | ICD-10-CM | POA: Diagnosis present

## 2023-04-28 DIAGNOSIS — Z7984 Long term (current) use of oral hypoglycemic drugs: Secondary | ICD-10-CM

## 2023-04-28 DIAGNOSIS — E876 Hypokalemia: Secondary | ICD-10-CM | POA: Diagnosis not present

## 2023-04-28 DIAGNOSIS — E162 Hypoglycemia, unspecified: Secondary | ICD-10-CM | POA: Diagnosis not present

## 2023-04-28 DIAGNOSIS — Z833 Family history of diabetes mellitus: Secondary | ICD-10-CM

## 2023-04-28 DIAGNOSIS — Z8249 Family history of ischemic heart disease and other diseases of the circulatory system: Secondary | ICD-10-CM | POA: Diagnosis not present

## 2023-04-28 DIAGNOSIS — J309 Allergic rhinitis, unspecified: Secondary | ICD-10-CM | POA: Diagnosis present

## 2023-04-28 DIAGNOSIS — E782 Mixed hyperlipidemia: Secondary | ICD-10-CM | POA: Diagnosis not present

## 2023-04-28 DIAGNOSIS — E785 Hyperlipidemia, unspecified: Secondary | ICD-10-CM | POA: Diagnosis present

## 2023-04-28 DIAGNOSIS — J301 Allergic rhinitis due to pollen: Secondary | ICD-10-CM | POA: Diagnosis not present

## 2023-04-28 DIAGNOSIS — E1169 Type 2 diabetes mellitus with other specified complication: Secondary | ICD-10-CM | POA: Diagnosis present

## 2023-04-28 DIAGNOSIS — N1832 Chronic kidney disease, stage 3b: Secondary | ICD-10-CM

## 2023-04-28 DIAGNOSIS — Z951 Presence of aortocoronary bypass graft: Secondary | ICD-10-CM | POA: Diagnosis not present

## 2023-04-28 DIAGNOSIS — Z7982 Long term (current) use of aspirin: Secondary | ICD-10-CM

## 2023-04-28 DIAGNOSIS — I252 Old myocardial infarction: Secondary | ICD-10-CM | POA: Diagnosis not present

## 2023-04-28 DIAGNOSIS — E119 Type 2 diabetes mellitus without complications: Secondary | ICD-10-CM | POA: Diagnosis not present

## 2023-04-28 DIAGNOSIS — I10 Essential (primary) hypertension: Secondary | ICD-10-CM

## 2023-04-28 DIAGNOSIS — G9341 Metabolic encephalopathy: Secondary | ICD-10-CM | POA: Diagnosis present

## 2023-04-28 DIAGNOSIS — Z8052 Family history of malignant neoplasm of bladder: Secondary | ICD-10-CM

## 2023-04-28 DIAGNOSIS — E16A3 Hypoglycemia level 3: Secondary | ICD-10-CM | POA: Diagnosis present

## 2023-04-28 DIAGNOSIS — R404 Transient alteration of awareness: Secondary | ICD-10-CM | POA: Diagnosis not present

## 2023-04-28 DIAGNOSIS — E161 Other hypoglycemia: Secondary | ICD-10-CM | POA: Diagnosis not present

## 2023-04-28 DIAGNOSIS — Z8262 Family history of osteoporosis: Secondary | ICD-10-CM

## 2023-04-28 LAB — URINALYSIS, W/ REFLEX TO CULTURE (INFECTION SUSPECTED)
Bilirubin Urine: NEGATIVE
Glucose, UA: 50 mg/dL — AB
Ketones, ur: NEGATIVE mg/dL
Nitrite: NEGATIVE
Protein, ur: 100 mg/dL — AB
Specific Gravity, Urine: 1.02 (ref 1.005–1.030)
pH: 5 (ref 5.0–8.0)

## 2023-04-28 LAB — CBC WITH DIFFERENTIAL/PLATELET
Abs Immature Granulocytes: 0.06 10*3/uL (ref 0.00–0.07)
Basophils Absolute: 0 10*3/uL (ref 0.0–0.1)
Basophils Relative: 0 %
Eosinophils Absolute: 0.2 10*3/uL (ref 0.0–0.5)
Eosinophils Relative: 2 %
HCT: 32.4 % — ABNORMAL LOW (ref 36.0–46.0)
Hemoglobin: 10.5 g/dL — ABNORMAL LOW (ref 12.0–15.0)
Immature Granulocytes: 1 %
Lymphocytes Relative: 19 %
Lymphs Abs: 1.8 10*3/uL (ref 0.7–4.0)
MCH: 28.6 pg (ref 26.0–34.0)
MCHC: 32.4 g/dL (ref 30.0–36.0)
MCV: 88.3 fL (ref 80.0–100.0)
Monocytes Absolute: 0.5 10*3/uL (ref 0.1–1.0)
Monocytes Relative: 5 %
Neutro Abs: 7.3 10*3/uL (ref 1.7–7.7)
Neutrophils Relative %: 73 %
Platelets: 216 10*3/uL (ref 150–400)
RBC: 3.67 MIL/uL — ABNORMAL LOW (ref 3.87–5.11)
RDW: 16.1 % — ABNORMAL HIGH (ref 11.5–15.5)
WBC: 9.9 10*3/uL (ref 4.0–10.5)
nRBC: 0 % (ref 0.0–0.2)

## 2023-04-28 LAB — COMPREHENSIVE METABOLIC PANEL
ALT: 16 U/L (ref 0–44)
AST: 25 U/L (ref 15–41)
Albumin: 3.5 g/dL (ref 3.5–5.0)
Alkaline Phosphatase: 99 U/L (ref 38–126)
Anion gap: 9 (ref 5–15)
BUN: 34 mg/dL — ABNORMAL HIGH (ref 8–23)
CO2: 20 mmol/L — ABNORMAL LOW (ref 22–32)
Calcium: 10.3 mg/dL (ref 8.9–10.3)
Chloride: 113 mmol/L — ABNORMAL HIGH (ref 98–111)
Creatinine, Ser: 1.21 mg/dL — ABNORMAL HIGH (ref 0.44–1.00)
GFR, Estimated: 47 mL/min — ABNORMAL LOW (ref >60)
Glucose, Bld: 73 mg/dL (ref 70–99)
Potassium: 4.2 mmol/L (ref 3.5–5.1)
Sodium: 142 mmol/L (ref 135–145)
Total Bilirubin: 0.5 mg/dL (ref <1.2)
Total Protein: 6.7 g/dL (ref 6.5–8.1)

## 2023-04-28 LAB — I-STAT VENOUS BLOOD GAS, ED
Acid-base deficit: 6 mmol/L — ABNORMAL HIGH (ref 0.0–2.0)
Bicarbonate: 19.7 mmol/L — ABNORMAL LOW (ref 20.0–28.0)
Calcium, Ion: 1.34 mmol/L (ref 1.15–1.40)
HCT: 32 % — ABNORMAL LOW (ref 36.0–46.0)
Hemoglobin: 10.9 g/dL — ABNORMAL LOW (ref 12.0–15.0)
O2 Saturation: 62 %
Potassium: 4 mmol/L (ref 3.5–5.1)
Sodium: 142 mmol/L (ref 135–145)
TCO2: 21 mmol/L — ABNORMAL LOW (ref 22–32)
pCO2, Ven: 36.8 mm[Hg] — ABNORMAL LOW (ref 44–60)
pH, Ven: 7.336 (ref 7.25–7.43)
pO2, Ven: 34 mm[Hg] (ref 32–45)

## 2023-04-28 LAB — RAPID URINE DRUG SCREEN, HOSP PERFORMED
Amphetamines: NOT DETECTED
Barbiturates: NOT DETECTED
Benzodiazepines: NOT DETECTED
Cocaine: NOT DETECTED
Opiates: NOT DETECTED
Tetrahydrocannabinol: NOT DETECTED

## 2023-04-28 LAB — TSH: TSH: 0.657 u[IU]/mL (ref 0.350–4.500)

## 2023-04-28 LAB — AMMONIA: Ammonia: <10 umol/L (ref 9–35)

## 2023-04-28 LAB — I-STAT CG4 LACTIC ACID, ED: Lactic Acid, Venous: 0.9 mmol/L (ref 0.5–1.9)

## 2023-04-28 LAB — CBG MONITORING, ED
Glucose-Capillary: 164 mg/dL — ABNORMAL HIGH (ref 70–99)
Glucose-Capillary: 148 mg/dL — ABNORMAL HIGH (ref 70–99)
Glucose-Capillary: 23 mg/dL — CL (ref 70–99)
Glucose-Capillary: 166 mg/dL — ABNORMAL HIGH (ref 70–99)

## 2023-04-28 LAB — T4, FREE: Free T4: 1.61 ng/dL — ABNORMAL HIGH (ref 0.61–1.12)

## 2023-04-28 MED ORDER — ONDANSETRON HCL 4 MG/2ML IJ SOLN: 4.0000 mg | Freq: Four times a day (QID) | INTRAMUSCULAR | Status: DC | PRN

## 2023-04-28 MED ORDER — MELATONIN 3 MG PO TABS: 3.0000 mg | ORAL_TABLET | Freq: Every evening | ORAL | Status: DC | PRN

## 2023-04-28 MED ORDER — DEXTROSE 50 % IV SOLN
1.0000 | Freq: Once | INTRAVENOUS | Status: AC
  Administered 2023-04-28: 50 mL via INTRAVENOUS
  Filled 2023-04-28: qty 50

## 2023-04-28 MED ORDER — DEXTROSE 10 % IV SOLN: INTRAVENOUS | Status: DC

## 2023-04-28 MED ORDER — ACETAMINOPHEN 325 MG PO TABS
650.0000 mg | ORAL_TABLET | Freq: Four times a day (QID) | ORAL | Status: DC | PRN
Start: 2023-04-28 — End: 2023-04-30

## 2023-04-28 MED ORDER — DEXTROSE 50 % IV SOLN: 1.0000 | INTRAVENOUS | Status: DC | PRN

## 2023-04-28 MED ORDER — ACETAMINOPHEN 650 MG RE SUPP: 650.0000 mg | Freq: Four times a day (QID) | RECTAL | Status: DC | PRN

## 2023-04-28 NOTE — ED Notes (Addendum)
Patient was placed on the bedside commode by this nurse. Patient was instructed to use the call light when she is done using the commode. Patient verbalized understanding. Call light was placed within reach of patient. Upon rounding on patient 5 minutes later, patient was found sitting on the floor. When asked what happened patient said her "grippy socks weren't grippy enough" and fell on her bottom. Patient denied pain, vitals signs stable, and was assisted back to bed. EDP Lawsing MD was notified.

## 2023-04-28 NOTE — ED Notes (Signed)
1st lactic 0.87 in normal range, 2nd can be discontinued unless Dr says otherwise
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
## 2023-04-28 NOTE — ED Notes (Signed)
This nurse took patient CBG with a result of 23. EDP Pfeiffer MD notified. Order placed for one amp Dextrose 50%. This nurse gave the dextrose 50% along with graham crackers and two cups of apple juice.

## 2023-04-28 NOTE — ED Notes (Signed)
Patient transported to CT 

## 2023-04-28 NOTE — H&P (Signed)
History and Physical      Kathryn Logan:[id number] DOB: December 31, 1945 DOA: 04/28/2023; DOS: 04/28/2023  PCP: Pincus Sanes, MD *** Patient coming from: home ***  I have personally briefly reviewed patient's old medical records in [facility]  Chief Complaint: ***  HPI: Kathryn Logan is a 77 y.o. female with medical history significant for *** who is admitted to [facility] on 04/28/2023 with *** after presenting from home*** to [facility] ED complaining of ***.    ***       ***   ED Course:  Vital signs in the ED were notable for the following: ***  Labs were notable for the following: ***  Per my interpretation, EKG in ED demonstrated the following:  ***  Imaging in the ED, per corresponding formal radiology read, was notable for the following:  ***  While in the ED, the following were administered: ***  Subsequently, the patient was admitted  ***  ***red    Review of Systems: As per HPI otherwise 10 point review of systems negative.   Past Medical History:  Diagnosis Date   Compression fracture of L1 lumbar vertebra (HCC) 11/21/2017   Diabetes mellitus without complication (HCC)    Gallbladder sludge    GERD (gastroesophageal reflux disease)    Heart disease    Hyperlipidemia    NSTEMI (non-ST elevated myocardial infarction) (HCC) 11/2013   Osteopenia    Syncope 11/2017    Past Surgical History:  Procedure Laterality Date   CORONARY ARTERY BYPASS GRAFT N/A 12/01/2013   Procedure: CORONARY ARTERY BYPASS GRAFTING (CABG) x three, using left internal mammary artery, and right leg saphenous vein harvested endoscopically (LIMA to LAD, SVG to OM2 , SVG to dRCA);  Surgeon: Delight Ovens, MD;  Location: [facility] OR;  Service: Open Heart Surgery;  Laterality: N/A;   G 1 P 1     INTRAOPERATIVE TRANSESOPHAGEAL ECHOCARDIOGRAM N/A 12/01/2013   Procedure: INTRAOPERATIVE TRANSESOPHAGEAL ECHOCARDIOGRAM;  Surgeon: Delight Ovens, MD;  Location: [facility] OR;   Service: Open Heart Surgery;  Laterality: N/A;   LEFT HEART CATHETERIZATION WITH CORONARY ANGIOGRAM N/A 11/27/2013   Procedure: LEFT HEART CATHETERIZATION WITH CORONARY ANGIOGRAM;  Surgeon: Lesleigh Noe, MD;  Location: [facility] CATH LAB;  Service: Cardiovascular;  Laterality: N/A;   no colonoscopy     SOC reviewed   TONSILLECTOMY AND ADENOIDECTOMY      Social History:  reports that she has never smoked. She has never used smokeless tobacco. She reports current alcohol use. She reports that she does not use drugs.   No Known Allergies  Family History  Problem Relation Age of Onset   Heart attack Mother 66   Diabetes Mother    Stroke Mother    Hypertension Mother    Goiter Mother    Heart attack Father        >55   Bladder Cancer Father    Parkinson's disease Maternal Aunt    Osteoporosis Sister    Diabetes Brother     Family history reviewed and not pertinent ***   Prior to Admission medications   Medication Sig Start Date End Date Taking? Authorizing Provider  aspirin EC 81 MG tablet Take 81 mg by mouth daily.     [provider]  BIOTIN PO Take 10,000 mcg by mouth daily.     [provider]  bisacodyl (DULCOLAX) 5 MG EC tablet Take 5 mg by mouth 3 (three) times a week.  [provider]  Calcium Carb-Cholecalciferol (CALCIUM + VITAMIN D3 PO) Take 600 mg by mouth. 600 mg- 25 mcg twice daily    [provider]  carvedilol (COREG) 3.125 MG tablet TAKE 1 TABLET(3.125 MG) BY MOUTH TWICE DAILY 03/29/23   Hilty, Lisette Abu, MD  Cholecalciferol (VITAMIN D3) 50 MCG (2000 UT) TABS Take by mouth.    [provider]  famotidine (PEPCID) 20 MG tablet TAKE 1 TABLET BY MOUTH TWICE DAILY Patient taking differently: Take 20 mg by mouth daily. 08/03/22   Pincus Sanes, MD  glucose blood (ONE TOUCH ULTRA TEST) test strip Check blood sugar daily as directed, DX 250.02 07/24/13   Pecola Lawless, MD  insulin aspart (NOVOLOG FLEXPEN) 100 UNIT/ML  FlexPen 0-9 Units, Subcutaneous, 3 times daily with meals CBG < 70: Implement Hypoglycemia measures CBG 70 - 120: 0 units CBG 121 - 150: 1 unit CBG 151 - 200: 2 units CBG 201 - 250: 3 units CBG 251 - 300: 5 units CBG 301 - 350: 7 units CBG 351 - 400: 9 units CBG > 400: call MD 03/28/23   Maretta Bees, MD  insulin detemir (LEVEMIR) 100 UNIT/ML injection Inject 0.32 mLs (32 Units total) into the skin daily. 03/28/23   Ghimire, Werner Lean, MD  loratadine (CLARITIN) 10 MG tablet Take 1 tablet (10 mg total) by mouth daily. 02/22/16   Pincus Sanes, MD  metFORMIN (GLUCOPHAGE) 500 MG tablet TAKE 2 TABLETS(1000 MG) BY MOUTH TWICE DAILY WITH A MEAL 04/02/23   Burns, Bobette Mo, MD  [facility] DELICA LANCETS FINE MISC 1 each by Does not apply route as directed. Check blood sugar daily as directed, DX 250.02 HOLD UNTIL PATIENT REQUEST 09/09/12   Pecola Lawless, MD  oxyCODONE (OXY IR/ROXICODONE) 5 MG immediate release tablet Take 1 tablet (5 mg total) by mouth every 8 (eight) hours as needed for severe pain (pain score 7-10) or moderate pain (pain score 4-6). 03/28/23   Ghimire, Werner Lean, MD  polyethylene glycol (MIRALAX) 17 g packet Take 17 g by mouth daily. 03/28/23   Ghimire, Werner Lean, MD  rosuvastatin (CRESTOR) 40 MG tablet TAKE 1 TABLET BY MOUTH EVERY DAY 07/17/22   Hilty, Lisette Abu, MD  senna-docusate (SENOKOT-S) 8.6-50 MG tablet Take 1 tablet by mouth at bedtime. 03/28/23   Ghimire, Werner Lean, MD  vitamin B-12 (CYANOCOBALAMIN) 1000 MCG tablet Take 1,000 mcg by mouth daily.    [provider]     Objective    Physical Exam: Vitals:   04/28/23 1900 04/28/23 1916 04/28/23 2000 04/28/23 2302  BP: 119/61  119/62 132/83  Pulse: 83  84 89  Resp: 15  14 15   Temp:  98.2 F (36.8 C)    TempSrc:  Oral    SpO2: 98%  97% 100%  Weight:      Height:        General: appears to be stated age; alert, oriented Skin: warm, dry, no rash Head:  AT/[place] Mouth:  Oral mucosa membranes appear moist,  normal dentition Neck: supple; trachea midline Heart:  RRR; did not appreciate any M/R/G Lungs: CTAB, did not appreciate any wheezes, rales, or rhonchi Abdomen: + BS; soft, ND, NT Vascular: 2+ pedal pulses b/l; 2+ radial pulses b/l Extremities: no peripheral edema, no muscle wasting Neuro: strength and sensation intact in upper and lower extremities b/l ***   *** Neuro: 5/5 strength of the proximal and distal flexors and extensors of the upper and lower extremities bilaterally;  sensation intact in upper and lower extremities b/l; cranial nerves II through XII grossly intact; no pronator drift; no evidence suggestive of slurred speech, dysarthria, or facial droop; Normal muscle tone. No tremors.  *** Neuro: In the setting of the patient's current mental status and associated inability to follow instructions, unable to perform full neurologic exam at this time.  As such, assessment of strength, sensation, and cranial nerves is limited at this time. Patient noted to spontaneously move all 4 extremities. No tremors.  ***    Labs on Admission: I have personally reviewed following labs and imaging studies  CBC: Recent Labs  Lab 04/28/23 2017 04/28/23 2032  WBC 9.9  --   NEUTROABS 7.3  --   HGB 10.5* 10.9*  HCT 32.4* 32.0*  MCV 88.3  --   PLT 216  --    Basic Metabolic Panel: Recent Labs  Lab 04/28/23 2017 04/28/23 2032  NA 142 142  K 4.2 4.0  CL 113*  --   CO2 20*  --   GLUCOSE 73  --   BUN 34*  --   CREATININE 1.21*  --   CALCIUM 10.3  --    GFR: Estimated Creatinine Clearance: 33.2 mL/min (A) (by C-G formula based on SCr of 1.21 mg/dL (H)). Liver Function Tests: Recent Labs  Lab 04/28/23 2017  AST 25  ALT 16  ALKPHOS 99  BILITOT 0.5  PROT 6.7  ALBUMIN 3.5   No results for input(s): "LIPASE", "AMYLASE" in the last 168 hours. Recent Labs  Lab 04/28/23 2017  AMMONIA <10   Coagulation Profile: No results for input(s): "INR", "PROTIME" in the last 168  hours. Cardiac Enzymes: No results for input(s): "CKTOTAL", "CKMB", "CKMBINDEX", "TROPONINI" in the last 168 hours. BNP (last 3 results) No results for input(s): "PROBNP" in the last 8760 hours. HbA1C: No results for input(s): "HGBA1C" in the last 72 hours. CBG: Recent Labs  Lab 04/28/23 1826 04/28/23 2131 04/28/23 2200 04/28/23 2259  GLUCAP 164* 23* 148* 166*   Lipid Profile: No results for input(s): "CHOL", "HDL", "LDLCALC", "TRIG", "CHOLHDL", "LDLDIRECT" in the last 72 hours. Thyroid Function Tests: Recent Labs    04/28/23 2017  TSH 0.657  FREET4 1.61*   Anemia Panel: No results for input(s): "VITAMINB12", "FOLATE", "FERRITIN", "TIBC", "IRON", "RETICCTPCT" in the last 72 hours. Urine analysis:    Component Value Date/Time   COLORURINE YELLOW 04/28/2023 2132   APPEARANCEUR CLOUDY (A) 04/28/2023 2132   LABSPEC 1.020 04/28/2023 2132   PHURINE 5.0 04/28/2023 2132   GLUCOSEU 50 (A) 04/28/2023 2132   HGBUR SMALL (A) 04/28/2023 2132   BILIRUBINUR NEGATIVE 04/28/2023 2132   KETONESUR NEGATIVE 04/28/2023 2132   PROTEINUR 100 (A) 04/28/2023 2132   UROBILINOGEN 1.0 11/30/2013 1021   NITRITE NEGATIVE 04/28/2023 2132   LEUKOCYTESUR TRACE (A) 04/28/2023 2132    Radiological Exams on Admission: CT HEAD WO CONTRAST Result Date: 04/28/2023 CLINICAL DATA:  Mental status change, unknown cause. EXAM: CT HEAD WITHOUT CONTRAST TECHNIQUE: Contiguous axial images were obtained from the base of the skull through the vertex without intravenous contrast. RADIATION DOSE REDUCTION: This exam was performed according to the departmental dose-optimization program which includes automated exposure control, adjustment of the mA and/or kV according to patient size and/or use of iterative reconstruction technique. COMPARISON:  Head CT 03/22/2023. FINDINGS: Brain: No acute hemorrhage. Unchanged mild chronic small-vessel disease. Cortical gray-white differentiation is otherwise preserved. Prominence of  the ventricles and sulci within expected range for age. No hydrocephalus or extra-axial collection.  No mass effect or midline shift. Vascular: No hyperdense vessel or unexpected calcification. Skull: No calvarial fracture or suspicious bone lesion. Skull base is unremarkable. Sinuses/Orbits: No acute finding. Other: None. IMPRESSION: 1. No acute intracranial abnormality. 2. Unchanged mild chronic small-vessel disease. Electronically Signed   By: Orvan Falconer M.D.   On: 04/28/2023 20:58   DG Chest Port 1 View Result Date: 04/28/2023 CLINICAL DATA:  Altered mental status EXAM: PORTABLE CHEST 1 VIEW COMPARISON:  03/22/2023 FINDINGS: Post sternotomy changes. No focal opacity or pleural effusion. Stable cardiomediastinal silhouette. No pneumothorax IMPRESSION: No active disease. Electronically Signed   By: Jasmine Pang M.D.   On: 04/28/2023 19:43      Assessment/Plan   Principal Problem:   Hypoglycemia   ***            ***                ***                 ***               ***               ***               ***                ***               ***               ***               ***               ***              ***          ***  DVT prophylaxis: SCD's ***  Code Status: Full code*** Family Communication: none*** Disposition Plan: Per Rounding Team Consults called: none***;  Admission status: ***     I SPENT GREATER THAN 75 *** MINUTES IN CLINICAL CARE TIME/MEDICAL DECISION-MAKING IN COMPLETING THIS ADMISSION.      Chaney Born Ernestyne Caldwell DO [facility]  From 7PM - 7AM   04/28/2023, 11:43 PM   ***

## 2023-04-28 NOTE — ED Provider Notes (Addendum)
Confused and somnolent all day. Blood sugar 59 at home. Patient just d/c from SNF a few days ago at baseline. Labs pending. May need admission for altered MS. Physical Exam  BP 119/61   Pulse 83   Temp 98.2 F (36.8 C) (Oral)   Resp 15   Ht 5\' 3"  (1.6 m)   Wt 53.1 kg   SpO2 98%   BMI 20.73 kg/m   Physical Exam  Procedures  Procedures  ED Course / MDM    Medical Decision Making Amount and/or Complexity of Data Reviewed Labs: ordered. Radiology: ordered.  Risk Prescription drug management. Decision regarding hospitalization.   Blood sugar dropped to 28 again after having had oral intake and blood sugar previously having responded to D10 and oral intake.  I discussed any recent changes to the patient.  She reports that she was getting metformin once a day when she was in the SNF and 1 prescriptions were filled that was prescribed for twice a day for the past couple days she has had metformin twice daily.  At this time I suspect this is the cause for recurrent hypoglycemia.  Patient will need admission.  At this time she is again alert and appropriate.  Pleasant.  No acute distress.  Consult: [facility] hospitalist Dr. Dalene Carrow for for admission.       Arby Barrette, MD 04/28/23 Lamar Blinks    Arby Barrette, MD 04/28/23 [phone]

## 2023-04-28 NOTE — ED Triage Notes (Signed)
Pt present to ED from home with noted AMS per the family. Per EMS, family states pt has been asleep for total of 24 hours, family states they thought pt was tired. Pt cbg 59, D10 administered, current cbg 302 prior ED arrival. Family states pt does not have a glucometer in home. Pt A&O to name and place at this time. Pt denies pain/discomfort at this time.

## 2023-04-29 ENCOUNTER — Encounter (HOSPITAL_COMMUNITY): Payer: Self-pay | Admitting: Internal Medicine

## 2023-04-29 DIAGNOSIS — Z7982 Long term (current) use of aspirin: Secondary | ICD-10-CM | POA: Diagnosis not present

## 2023-04-29 DIAGNOSIS — Z8249 Family history of ischemic heart disease and other diseases of the circulatory system: Secondary | ICD-10-CM | POA: Diagnosis not present

## 2023-04-29 DIAGNOSIS — Z823 Family history of stroke: Secondary | ICD-10-CM | POA: Diagnosis not present

## 2023-04-29 DIAGNOSIS — J309 Allergic rhinitis, unspecified: Secondary | ICD-10-CM | POA: Diagnosis present

## 2023-04-29 DIAGNOSIS — Z820 Family history of epilepsy and other diseases of the nervous system: Secondary | ICD-10-CM | POA: Diagnosis not present

## 2023-04-29 DIAGNOSIS — E11649 Type 2 diabetes mellitus with hypoglycemia without coma: Secondary | ICD-10-CM | POA: Diagnosis present

## 2023-04-29 DIAGNOSIS — K219 Gastro-esophageal reflux disease without esophagitis: Secondary | ICD-10-CM | POA: Diagnosis present

## 2023-04-29 DIAGNOSIS — E162 Hypoglycemia, unspecified: Secondary | ICD-10-CM | POA: Diagnosis present

## 2023-04-29 DIAGNOSIS — E16A3 Hypoglycemia level 3: Secondary | ICD-10-CM | POA: Diagnosis present

## 2023-04-29 DIAGNOSIS — I10 Essential (primary) hypertension: Secondary | ICD-10-CM | POA: Diagnosis present

## 2023-04-29 DIAGNOSIS — D696 Thrombocytopenia, unspecified: Secondary | ICD-10-CM | POA: Diagnosis present

## 2023-04-29 DIAGNOSIS — Z7984 Long term (current) use of oral hypoglycemic drugs: Secondary | ICD-10-CM | POA: Diagnosis not present

## 2023-04-29 DIAGNOSIS — E876 Hypokalemia: Secondary | ICD-10-CM | POA: Diagnosis not present

## 2023-04-29 DIAGNOSIS — D649 Anemia, unspecified: Secondary | ICD-10-CM | POA: Diagnosis present

## 2023-04-29 DIAGNOSIS — Z8052 Family history of malignant neoplasm of bladder: Secondary | ICD-10-CM | POA: Diagnosis not present

## 2023-04-29 DIAGNOSIS — I2510 Atherosclerotic heart disease of native coronary artery without angina pectoris: Secondary | ICD-10-CM | POA: Diagnosis present

## 2023-04-29 DIAGNOSIS — G9341 Metabolic encephalopathy: Secondary | ICD-10-CM | POA: Diagnosis present

## 2023-04-29 DIAGNOSIS — E785 Hyperlipidemia, unspecified: Secondary | ICD-10-CM | POA: Diagnosis present

## 2023-04-29 DIAGNOSIS — E8721 Acute metabolic acidosis: Secondary | ICD-10-CM | POA: Diagnosis present

## 2023-04-29 DIAGNOSIS — Z951 Presence of aortocoronary bypass graft: Secondary | ICD-10-CM | POA: Diagnosis not present

## 2023-04-29 DIAGNOSIS — I252 Old myocardial infarction: Secondary | ICD-10-CM | POA: Diagnosis not present

## 2023-04-29 DIAGNOSIS — N179 Acute kidney failure, unspecified: Secondary | ICD-10-CM | POA: Diagnosis present

## 2023-04-29 DIAGNOSIS — Z79899 Other long term (current) drug therapy: Secondary | ICD-10-CM | POA: Diagnosis not present

## 2023-04-29 LAB — COMPREHENSIVE METABOLIC PANEL
ALT: 12 U/L (ref 0–44)
AST: 19 U/L (ref 15–41)
Albumin: 2.8 g/dL — ABNORMAL LOW (ref 3.5–5.0)
Alkaline Phosphatase: 75 U/L (ref 38–126)
Anion gap: 8 (ref 5–15)
BUN: 24 mg/dL — ABNORMAL HIGH (ref 8–23)
CO2: 17 mmol/L — ABNORMAL LOW (ref 22–32)
Calcium: 8.3 mg/dL — ABNORMAL LOW (ref 8.9–10.3)
Chloride: 111 mmol/L (ref 98–111)
Creatinine, Ser: 1.05 mg/dL — ABNORMAL HIGH (ref 0.44–1.00)
GFR, Estimated: 55 mL/min — ABNORMAL LOW (ref >60)
Glucose, Bld: 301 mg/dL — ABNORMAL HIGH (ref 70–99)
Potassium: 3.1 mmol/L — ABNORMAL LOW (ref 3.5–5.1)
Sodium: 136 mmol/L (ref 135–145)
Total Bilirubin: 0.4 mg/dL (ref <1.2)
Total Protein: 5.4 g/dL — ABNORMAL LOW (ref 6.5–8.1)

## 2023-04-29 LAB — GLUCOSE, CAPILLARY
Glucose-Capillary: 153 mg/dL — ABNORMAL HIGH (ref 70–99)
Glucose-Capillary: 164 mg/dL — ABNORMAL HIGH (ref 70–99)
Glucose-Capillary: 191 mg/dL — ABNORMAL HIGH (ref 70–99)

## 2023-04-29 LAB — CBC WITH DIFFERENTIAL/PLATELET
Abs Immature Granulocytes: 0.03 10*3/uL (ref 0.00–0.07)
Basophils Absolute: 0 10*3/uL (ref 0.0–0.1)
Basophils Relative: 0 %
Eosinophils Absolute: 0.2 10*3/uL (ref 0.0–0.5)
Eosinophils Relative: 3 %
HCT: 26.6 % — ABNORMAL LOW (ref 36.0–46.0)
Hemoglobin: 8.7 g/dL — ABNORMAL LOW (ref 12.0–15.0)
Immature Granulocytes: 0 %
Lymphocytes Relative: 26 %
Lymphs Abs: 2.2 10*3/uL (ref 0.7–4.0)
MCH: 28.5 pg (ref 26.0–34.0)
MCHC: 32.7 g/dL (ref 30.0–36.0)
MCV: 87.2 fL (ref 80.0–100.0)
Monocytes Absolute: 0.5 10*3/uL (ref 0.1–1.0)
Monocytes Relative: 6 %
Neutro Abs: 5.4 10*3/uL (ref 1.7–7.7)
Neutrophils Relative %: 65 %
Platelets: 144 10*3/uL — ABNORMAL LOW (ref 150–400)
RBC: 3.05 MIL/uL — ABNORMAL LOW (ref 3.87–5.11)
RDW: 15.9 % — ABNORMAL HIGH (ref 11.5–15.5)
WBC: 8.4 10*3/uL (ref 4.0–10.5)
nRBC: 0 % (ref 0.0–0.2)

## 2023-04-29 LAB — HEMOGLOBIN A1C
Hgb A1c MFr Bld: 7.2 % — ABNORMAL HIGH (ref 4.8–5.6)
Mean Plasma Glucose: 159.94 mg/dL

## 2023-04-29 LAB — PROCALCITONIN: Procalcitonin: <0.1 ng/mL

## 2023-04-29 LAB — CBG MONITORING, ED
Glucose-Capillary: 169 mg/dL — ABNORMAL HIGH (ref 70–99)
Glucose-Capillary: 228 mg/dL — ABNORMAL HIGH (ref 70–99)
Glucose-Capillary: 246 mg/dL — ABNORMAL HIGH (ref 70–99)
Glucose-Capillary: 271 mg/dL — ABNORMAL HIGH (ref 70–99)

## 2023-04-29 LAB — MAGNESIUM
Magnesium: 1.5 mg/dL — ABNORMAL LOW (ref 1.7–2.4)
Magnesium: 1.9 mg/dL (ref 1.7–2.4)

## 2023-04-29 MED ORDER — FAMOTIDINE 20 MG PO TABS
20.0000 mg | ORAL_TABLET | Freq: Every day | ORAL | Status: DC
  Administered 2023-04-29 – 2023-04-30 (×2): 20 mg via ORAL
  Filled 2023-04-29 (×2): qty 1

## 2023-04-29 MED ORDER — ASPIRIN 81 MG PO TBEC
81.0000 mg | DELAYED_RELEASE_TABLET | Freq: Every day | ORAL | Status: DC
Start: 2023-04-29 — End: 2023-04-30
  Administered 2023-04-29 – 2023-04-30 (×2): 81 mg via ORAL
  Filled 2023-04-29 (×2): qty 1

## 2023-04-29 MED ORDER — CARVEDILOL 3.125 MG PO TABS
3.1250 mg | ORAL_TABLET | Freq: Two times a day (BID) | ORAL | Status: DC
  Administered 2023-04-29 – 2023-04-30 (×3): 3.125 mg via ORAL
  Filled 2023-04-29 (×3): qty 1

## 2023-04-29 MED ORDER — LORATADINE 10 MG PO TABS
10.0000 mg | ORAL_TABLET | Freq: Every day | ORAL | Status: DC
Start: 2023-04-29 — End: 2023-04-30
  Administered 2023-04-29 – 2023-04-30 (×2): 10 mg via ORAL
  Filled 2023-04-29 (×2): qty 1

## 2023-04-29 MED ORDER — MAGNESIUM SULFATE 2 GM/50ML IV SOLN
2.0000 g | Freq: Once | INTRAVENOUS | Status: AC
  Administered 2023-04-29: 2 g via INTRAVENOUS
  Filled 2023-04-29: qty 50

## 2023-04-29 MED ORDER — POTASSIUM CHLORIDE CRYS ER 20 MEQ PO TBCR
40.0000 meq | EXTENDED_RELEASE_TABLET | Freq: Once | ORAL | Status: AC
  Administered 2023-04-29: 40 meq via ORAL
  Filled 2023-04-29: qty 2

## 2023-04-29 MED ORDER — ROSUVASTATIN CALCIUM 20 MG PO TABS
40.0000 mg | ORAL_TABLET | Freq: Every day | ORAL | Status: DC
  Administered 2023-04-29: 40 mg via ORAL
  Filled 2023-04-29: qty 2
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
## 2023-04-29 NOTE — Care Management Obs Status (Signed)
MEDICARE OBSERVATION STATUS NOTIFICATION   Patient Details  Name: Kathryn Logan MRN: [id number] Date of Birth: 08-Aug-1945   Medicare Observation Status Notification Given:  Yes    Ronny Bacon, RN 04/29/2023, 3:01 PM

## 2023-04-29 NOTE — Plan of Care (Signed)

## 2023-04-29 NOTE — ED Notes (Signed)
ED TO INPATIENT HANDOFF REPORT  ED Nurse Name and Phone #: [phone]  S Name/Age/Gender Kathryn Logan 77 y.o. female Room/Bed: 036C/036C  Code Status   Code Status: Full Code  Home/SNF/Other Home Patient oriented to: self, place, time, and situation Is this baseline? Yes   Triage Complete: Triage complete  Chief Complaint Hypoglycemia [E16.2]  Triage Note Pt present to ED from home with noted AMS per the family. Per EMS, family states pt has been asleep for total of 24 hours, family states they thought pt was tired. Pt cbg 59, D10 administered, current cbg 302 prior ED arrival. Family states pt does not have a glucometer in home. Pt A&O to name and place at this time. Pt denies pain/discomfort at this time.   Allergies No Known Allergies  Level of Care/Admitting Diagnosis ED Disposition     ED Disposition  Admit   Condition  --   Comment  Hospital Area: [facility] [[id number]]  Level of Care: Telemetry Medical [104]  May place patient in observation at [facility] or [facility] if equivalent level of care is available:: No  Covid Evaluation: Asymptomatic - no recent exposure (last 10 days) testing not required  Diagnosis: Hypoglycemia [242204]  Admitting Physician: Angie Fava [[id number]]  Attending Physician: Angie Fava [[id number]]          B Medical/Surgery History Past Medical History:  Diagnosis Date   Compression fracture of L1 lumbar vertebra (HCC) 11/21/2017   Diabetes mellitus without complication (HCC)    Gallbladder sludge    GERD (gastroesophageal reflux disease)    Heart disease    Hyperlipidemia    NSTEMI (non-ST elevated myocardial infarction) (HCC) 11/2013   Osteopenia    Syncope 11/2017   Past Surgical History:  Procedure Laterality Date   CORONARY ARTERY BYPASS GRAFT N/A 12/01/2013   Procedure: CORONARY ARTERY BYPASS GRAFTING (CABG) x three, using left internal mammary artery, and right leg saphenous  vein harvested endoscopically (LIMA to LAD, SVG to OM2 , SVG to dRCA);  Surgeon: Delight Ovens, MD;  Location[facility] OR;  Service: Open Heart Surgery;  Laterality: N/A;   G 1 P 1     INTRAOPERATIVE TRANSESOPHAGEAL ECHOCARDIOGRAM N/A 12/01/2013   Procedure: INTRAOPERATIVE TRANSESOPHAGEAL ECHOCARDIOGRAM;  Surgeon: Delight Ovens, MD;  Location: [facility] OR;  Service: Open Heart Surgery;  Laterality: N/A;   LEFT HEART CATHETERIZATION WITH CORONARY ANGIOGRAM N/A 11/27/2013   Procedure: LEFT HEART CATHETERIZATION WITH CORONARY ANGIOGRAM;  Surgeon: Lesleigh Noe, MD;  Location: [facility] CATH LAB;  Service: Cardiovascular;  Laterality: N/A;   no colonoscopy     SOC reviewed   TONSILLECTOMY AND ADENOIDECTOMY       A IV Location/Drains/Wounds Patient Lines/Drains/Airways Status     Active Line/Drains/Airways     Name Placement date Placement time Site Days   Peripheral IV 04/28/23 20 G 1" Distal;Posterior;Right Forearm 04/28/23  1825  Forearm  1            Intake/Output Last 24 hours  Intake/Output Summary (Last 24 hours) at 04/29/2023 0837 Last data filed at 04/29/2023 0707 Gross per 24 hour  Intake 921.38 ml  Output --  Net 921.38 ml    Labs/Imaging Results for orders placed or performed during the hospital encounter of 04/28/23 (from the past 48 hours)  POC CBG, ED     Status: Abnormal   Collection Time: 04/28/23  6:26 PM  Result Value Ref Range   Glucose-Capillary 164 (H) 70 -  99 mg/dL    Comment: Glucose reference range applies only to samples taken after fasting for at least 8 hours.  Comprehensive metabolic panel     Status: Abnormal   Collection Time: 04/28/23  8:17 PM  Result Value Ref Range   Sodium 142 135 - 145 mmol/L   Potassium 4.2 3.5 - 5.1 mmol/L   Chloride 113 (H) 98 - 111 mmol/L   CO2 20 (L) 22 - 32 mmol/L   Glucose, Bld 73 70 - 99 mg/dL    Comment: Glucose reference range applies only to samples taken after fasting for at least 8 hours.   BUN 34 (H) 8 - 23  mg/dL   Creatinine, Ser 1.61 (H) 0.44 - 1.00 mg/dL   Calcium 09.6 8.9 - 04.5 mg/dL   Total Protein 6.7 6.5 - 8.1 g/dL   Albumin 3.5 3.5 - 5.0 g/dL   AST 25 15 - 41 U/L   ALT 16 0 - 44 U/L   Alkaline Phosphatase 99 38 - 126 U/L   Total Bilirubin 0.5 <1.2 mg/dL   GFR, Estimated 47 (L) >60 mL/min    Comment: (NOTE) Calculated using the CKD-EPI Creatinine Equation (2021)    Anion gap 9 5 - 15    Comment: Performed at [facility], [address], [place], [place] [address]  CBC with Differential/Platelet     Status: Abnormal   Collection Time: 04/28/23  8:17 PM  Result Value Ref Range   WBC 9.9 4.0 - 10.5 K/uL   RBC 3.67 (L) 3.87 - 5.11 MIL/uL   Hemoglobin 10.5 (L) 12.0 - 15.0 g/dL   HCT 19.1 (L) 47.8 - 29.5 %   MCV 88.3 80.0 - 100.0 fL   MCH 28.6 26.0 - 34.0 pg   MCHC 32.4 30.0 - 36.0 g/dL   RDW 62.1 (H) 30.8 - 65.7 %   Platelets 216 150 - 400 K/uL   nRBC 0.0 0.0 - 0.2 %   Neutrophils Relative % 73 %   Neutro Abs 7.3 1.7 - 7.7 K/uL   Lymphocytes Relative 19 %   Lymphs Abs 1.8 0.7 - 4.0 K/uL   Monocytes Relative 5 %   Monocytes Absolute 0.5 0.1 - 1.0 K/uL   Eosinophils Relative 2 %   Eosinophils Absolute 0.2 0.0 - 0.5 K/uL   Basophils Relative 0 %   Basophils Absolute 0.0 0.0 - 0.1 K/uL   Immature Granulocytes 1 %   Abs Immature Granulocytes 0.06 0.00 - 0.07 K/uL    Comment: Performed at [facility], [address] [place], [place] [address]  Ammonia     Status: None   Collection Time: 04/28/23  8:17 PM  Result Value Ref Range   Ammonia <10 9 - 35 umol/L    Comment: Performed at [facility], [address] [place], [place] [address]  TSH     Status: None   Collection Time: 04/28/23  8:17 PM  Result Value Ref Range   TSH 0.657 0.350 - 4.500 uIU/mL    Comment: Performed by a 3rd Generation assay with a functional sensitivity of <=0.01 uIU/mL. Performed at [facility], [address], [place], [place] [address]   T4, free     Status: Abnormal    Collection Time: 04/28/23  8:17 PM  Result Value Ref Range   Free T4 1.61 (H) 0.61 - 1.12 ng/dL    Comment: (NOTE) Biotin ingestion may interfere with free T4 tests. If the results  are inconsistent with the TSH level, previous test results, or the clinical presentation, then consider biotin interference. If needed, order repeat testing after stopping biotin. Performed at [facility], [address] [place], [place] [address]   I-Stat venous blood gas, ED     Status: Abnormal   Collection Time: 04/28/23  8:32 PM  Result Value Ref Range   pH, Ven 7.336 7.25 - 7.43   pCO2, Ven 36.8 (L) 44 - 60 mmHg   pO2, Ven 34 32 - 45 mmHg   Bicarbonate 19.7 (L) 20.0 - 28.0 mmol/L   TCO2 21 (L) 22 - 32 mmol/L   O2 Saturation 62 %   Acid-base deficit 6.0 (H) 0.0 - 2.0 mmol/L   Sodium 142 135 - 145 mmol/L   Potassium 4.0 3.5 - 5.1 mmol/L   Calcium, Ion 1.34 1.15 - 1.40 mmol/L   HCT 32.0 (L) 36.0 - 46.0 %   Hemoglobin 10.9 (L) 12.0 - 15.0 g/dL   Sample type VENOUS    Comment NOTIFIED PHYSICIAN   I-Stat Lactic Acid, ED     Status: None   Collection Time: 04/28/23  8:33 PM  Result Value Ref Range   Lactic Acid, Venous 0.9 0.5 - 1.9 mmol/L  CBG monitoring, ED     Status: Abnormal   Collection Time: 04/28/23  9:31 PM  Result Value Ref Range   Glucose-Capillary 23 (LL) 70 - 99 mg/dL    Comment: Glucose reference range applies only to samples taken after fasting for at least 8 hours.  Rapid urine drug screen (hospital performed)     Status: None   Collection Time: 04/28/23  9:32 PM  Result Value Ref Range   Opiates NONE DETECTED NONE DETECTED   Cocaine NONE DETECTED NONE DETECTED   Benzodiazepines NONE DETECTED NONE DETECTED   Amphetamines NONE DETECTED NONE DETECTED   Tetrahydrocannabinol NONE DETECTED NONE DETECTED   Barbiturates NONE DETECTED NONE DETECTED    Comment: (NOTE) DRUG SCREEN FOR MEDICAL PURPOSES ONLY.  IF CONFIRMATION IS NEEDED FOR ANY PURPOSE, NOTIFY LAB WITHIN 5  DAYS.  LOWEST DETECTABLE LIMITS FOR URINE DRUG SCREEN Drug Class                     Cutoff (ng/mL) Amphetamine and metabolites    1000 Barbiturate and metabolites    200 Benzodiazepine                 200 Opiates and metabolites        300 Cocaine and metabolites        300 THC                            50 Performed at [facility] Lab, [address], [place], [place] [address]   Urinalysis, w/ Reflex to Culture (Infection Suspected) -Urine, Clean Catch     Status: Abnormal   Collection Time: 04/28/23  9:32 PM  Result Value Ref Range   Specimen Source URINE, CLEAN CATCH    Color, Urine YELLOW YELLOW   APPearance CLOUDY (A) CLEAR   Specific Gravity, Urine 1.020 1.005 - 1.030   pH 5.0 5.0 - 8.0   Glucose, UA 50 (A) NEGATIVE mg/dL   Hgb urine dipstick SMALL (A) NEGATIVE   Bilirubin Urine NEGATIVE NEGATIVE   Ketones, ur NEGATIVE NEGATIVE mg/dL   Protein, ur 865 (A) NEGATIVE mg/dL   Nitrite NEGATIVE NEGATIVE   Leukocytes,Ua TRACE (A)  NEGATIVE   RBC / HPF 0-5 0 - 5 RBC/hpf   WBC, UA 6-10 0 - 5 WBC/hpf    Comment:        Reflex urine culture not performed if WBC <=10, OR if Squamous epithelial cells >5. If Squamous epithelial cells >5 suggest recollection.    Bacteria, UA RARE (A) NONE SEEN   Squamous Epithelial / HPF 0-5 0 - 5 /HPF   Mucus PRESENT     Comment: Performed at [facility] Lab, [address], [place], [place] [address]  CBG monitoring, ED     Status: Abnormal   Collection Time: 04/28/23 10:00 PM  Result Value Ref Range   Glucose-Capillary 148 (H) 70 - 99 mg/dL    Comment: Glucose reference range applies only to samples taken after fasting for at least 8 hours.  CBG monitoring, ED     Status: Abnormal   Collection Time: 04/28/23 10:59 PM  Result Value Ref Range   Glucose-Capillary 166 (H) 70 - 99 mg/dL    Comment: Glucose reference range applies only to samples taken after fasting for at least 8 hours.  Magnesium     Status: None   Collection Time:  04/28/23 11:59 PM  Result Value Ref Range   Magnesium 1.9 1.7 - 2.4 mg/dL    Comment: Performed at [facility] Lab, [address], [place], [place] [address]  CBG monitoring, ED     Status: Abnormal   Collection Time: 04/29/23 12:06 AM  Result Value Ref Range   Glucose-Capillary 228 (H) 70 - 99 mg/dL    Comment: Glucose reference range applies only to samples taken after fasting for at least 8 hours.  CBG monitoring, ED     Status: Abnormal   Collection Time: 04/29/23  2:13 AM  Result Value Ref Range   Glucose-Capillary 271 (H) 70 - 99 mg/dL    Comment: Glucose reference range applies only to samples taken after fasting for at least 8 hours.  CBG monitoring, ED     Status: Abnormal   Collection Time: 04/29/23  4:26 AM  Result Value Ref Range   Glucose-Capillary 246 (H) 70 - 99 mg/dL    Comment: Glucose reference range applies only to samples taken after fasting for at least 8 hours.  CBC with Differential/Platelet     Status: Abnormal   Collection Time: 04/29/23  5:01 AM  Result Value Ref Range   WBC 8.4 4.0 - 10.5 K/uL   RBC 3.05 (L) 3.87 - 5.11 MIL/uL   Hemoglobin 8.7 (L) 12.0 - 15.0 g/dL   HCT 29.5 (L) 28.4 - 13.2 %   MCV 87.2 80.0 - 100.0 fL   MCH 28.5 26.0 - 34.0 pg   MCHC 32.7 30.0 - 36.0 g/dL   RDW 44.0 (H) 10.2 - 72.5 %   Platelets 144 (L) 150 - 400 K/uL    Comment: REPEATED TO VERIFY   nRBC 0.0 0.0 - 0.2 %   Neutrophils Relative % 65 %   Neutro Abs 5.4 1.7 - 7.7 K/uL   Lymphocytes Relative 26 %   Lymphs Abs 2.2 0.7 - 4.0 K/uL   Monocytes Relative 6 %   Monocytes Absolute 0.5 0.1 - 1.0 K/uL   Eosinophils Relative 3 %   Eosinophils Absolute 0.2 0.0 - 0.5 K/uL   Basophils Relative 0 %   Basophils Absolute 0.0 0.0 - 0.1 K/uL   Immature Granulocytes 0 %   Abs Immature Granulocytes 0.03 0.00 - 0.07 K/uL  Comment: Performed at [facility], [address] [place], [place] [address]  Comprehensive metabolic panel     Status: Abnormal   Collection Time:  04/29/23  5:01 AM  Result Value Ref Range   Sodium 136 135 - 145 mmol/L   Potassium 3.1 (L) 3.5 - 5.1 mmol/L   Chloride 111 98 - 111 mmol/L   CO2 17 (L) 22 - 32 mmol/L   Glucose, Bld 301 (H) 70 - 99 mg/dL    Comment: Glucose reference range applies only to samples taken after fasting for at least 8 hours.   BUN 24 (H) 8 - 23 mg/dL   Creatinine, Ser 4.78 (H) 0.44 - 1.00 mg/dL   Calcium 8.3 (L) 8.9 - 10.3 mg/dL    Comment: DELTA CHECK NOTED   Total Protein 5.4 (L) 6.5 - 8.1 g/dL   Albumin 2.8 (L) 3.5 - 5.0 g/dL   AST 19 15 - 41 U/L   ALT 12 0 - 44 U/L   Alkaline Phosphatase 75 38 - 126 U/L   Total Bilirubin 0.4 <1.2 mg/dL   GFR, Estimated 55 (L) >60 mL/min    Comment: (NOTE) Calculated using the CKD-EPI Creatinine Equation (2021)    Anion gap 8 5 - 15    Comment: Performed at [facility], [address] [place], [place] [address]  Magnesium     Status: Abnormal   Collection Time: 04/29/23  5:01 AM  Result Value Ref Range   Magnesium 1.5 (L) 1.7 - 2.4 mg/dL    Comment: Performed at [facility], [address] [place], [place] [address]  Hemoglobin A1c     Status: Abnormal   Collection Time: 04/29/23  5:01 AM  Result Value Ref Range   Hgb A1c MFr Bld 7.2 (H) 4.8 - 5.6 %    Comment: (NOTE) Pre diabetes:          5.7%-6.4%  Diabetes:              >6.4%  Glycemic control for   <7.0% adults with diabetes    Mean Plasma Glucose 159.94 mg/dL    Comment: Performed at [facility] Lab, [address], [place], [place] [address]  Procalcitonin     Status: None   Collection Time: 04/29/23  5:01 AM  Result Value Ref Range   Procalcitonin <0.10 ng/mL    Comment:        Interpretation: PCT (Procalcitonin) <= 0.5 ng/mL: Systemic infection (sepsis) is not likely. Local bacterial infection is possible. (NOTE)       Sepsis PCT Algorithm           Lower Respiratory Tract                                      Infection PCT Algorithm    ----------------------------      ----------------------------         PCT < 0.25 ng/mL                PCT < 0.10 ng/mL          Strongly encourage             Strongly discourage   discontinuation of antibiotics    initiation of antibiotics    ----------------------------     -----------------------------       PCT 0.25 - 0.50 ng/mL  PCT 0.10 - 0.25 ng/mL               OR       >80% decrease in PCT            Discourage initiation of                                            antibiotics      Encourage discontinuation           of antibiotics    ----------------------------     -----------------------------         PCT >= 0.50 ng/mL              PCT 0.26 - 0.50 ng/mL               AND        <80% decrease in PCT             Encourage initiation of                                             antibiotics       Encourage continuation           of antibiotics    ----------------------------     -----------------------------        PCT >= 0.50 ng/mL                  PCT > 0.50 ng/mL               AND         increase in PCT                  Strongly encourage                                      initiation of antibiotics    Strongly encourage escalation           of antibiotics                                     -----------------------------                                           PCT <= 0.25 ng/mL                                                 OR                                        > 80% decrease in PCT  Discontinue / Do not initiate                                             antibiotics  Performed at [facility] Lab, [address] [place], [place] [address]   CBG monitoring, ED     Status: Abnormal   Collection Time: 04/29/23  8:12 AM  Result Value Ref Range   Glucose-Capillary 169 (H) 70 - 99 mg/dL    Comment: Glucose reference range applies only to samples taken after fasting for at least 8 hours.   CT HEAD WO CONTRAST Result Date:  04/28/2023 CLINICAL DATA:  Mental status change, unknown cause. EXAM: CT HEAD WITHOUT CONTRAST TECHNIQUE: Contiguous axial images were obtained from the base of the skull through the vertex without intravenous contrast. RADIATION DOSE REDUCTION: This exam was performed according to the departmental dose-optimization program which includes automated exposure control, adjustment of the mA and/or kV according to patient size and/or use of iterative reconstruction technique. COMPARISON:  Head CT 03/22/2023. FINDINGS: Brain: No acute hemorrhage. Unchanged mild chronic small-vessel disease. Cortical gray-white differentiation is otherwise preserved. Prominence of the ventricles and sulci within expected range for age. No hydrocephalus or extra-axial collection. No mass effect or midline shift. Vascular: No hyperdense vessel or unexpected calcification. Skull: No calvarial fracture or suspicious bone lesion. Skull base is unremarkable. Sinuses/Orbits: No acute finding. Other: None. IMPRESSION: 1. No acute intracranial abnormality. 2. Unchanged mild chronic small-vessel disease. Electronically Signed   By: Orvan Falconer M.D.   On: 04/28/2023 20:58   DG Chest Port 1 View Result Date: 04/28/2023 CLINICAL DATA:  Altered mental status EXAM: PORTABLE CHEST 1 VIEW COMPARISON:  03/22/2023 FINDINGS: Post sternotomy changes. No focal opacity or pleural effusion. Stable cardiomediastinal silhouette. No pneumothorax IMPRESSION: No active disease. Electronically Signed   By: Jasmine Pang M.D.   On: 04/28/2023 19:43    Pending Labs Unresulted Labs (From admission, onward)     Start     Ordered   04/30/23 0500  Phosphorus  Tomorrow morning,   R        04/29/23 0607   04/29/23 0608  Parathyroid hormone, intact (no Ca)  Add-on,   AD        04/29/23 0607   04/28/23 1915  Blood Culture (Routine X 2)  BLOOD CULTURE X 2,   R (with STAT occurrences)      04/28/23 1915            Vitals/Pain Today's Vitals    04/29/23 0535 04/29/23 0540 04/29/23 0625 04/29/23 0629  BP:   135/69   Pulse: 96 95 98   Resp: 20 14 16    Temp:    99.2 F (37.3 C)  TempSrc:    Oral  SpO2: 100% 100% 99%   Weight:      Height:      PainSc:        Isolation Precautions No active isolations  Medications Medications  acetaminophen (TYLENOL) tablet 650 mg (has no administration in time range)    Or  acetaminophen (TYLENOL) suppository 650 mg (has no administration in time range)  melatonin tablet 3 mg (has no administration in time range)  ondansetron (ZOFRAN) injection 4 mg (has no administration in time range)  dextrose 50 % solution 50 mL (has no administration in time range)  aspirin EC tablet 81 mg (has no  administration in time range)  carvedilol (COREG) tablet 3.125 mg (has no administration in time range)  famotidine (PEPCID) tablet 20 mg (has no administration in time range)  loratadine (CLARITIN) tablet 10 mg (has no administration in time range)  rosuvastatin (CRESTOR) tablet 40 mg (has no administration in time range)  dextrose 50 % solution 50 mL (50 mLs Intravenous Given 04/28/23 2142)    Mobility walks     Focused Assessments    R Recommendations: See Admitting Provider Note  Report given to:   Additional Notes:

## 2023-04-30 ENCOUNTER — Other Ambulatory Visit: Payer: Self-pay

## 2023-04-30 DIAGNOSIS — E119 Type 2 diabetes mellitus without complications: Secondary | ICD-10-CM

## 2023-04-30 DIAGNOSIS — E162 Hypoglycemia, unspecified: Secondary | ICD-10-CM | POA: Diagnosis not present

## 2023-04-30 LAB — BASIC METABOLIC PANEL
Anion gap: 6 (ref 5–15)
BUN: 29 mg/dL — ABNORMAL HIGH (ref 8–23)
CO2: 17 mmol/L — ABNORMAL LOW (ref 22–32)
Calcium: 9.1 mg/dL (ref 8.9–10.3)
Chloride: 114 mmol/L — ABNORMAL HIGH (ref 98–111)
Creatinine, Ser: 1.43 mg/dL — ABNORMAL HIGH (ref 0.44–1.00)
GFR, Estimated: 38 mL/min — ABNORMAL LOW (ref >60)
Glucose, Bld: 165 mg/dL — ABNORMAL HIGH (ref 70–99)
Potassium: 4.5 mmol/L (ref 3.5–5.1)
Sodium: 137 mmol/L (ref 135–145)

## 2023-04-30 LAB — CBC
HCT: 27.5 % — ABNORMAL LOW (ref 36.0–46.0)
Hemoglobin: 9.2 g/dL — ABNORMAL LOW (ref 12.0–15.0)
MCH: 28.7 pg (ref 26.0–34.0)
MCHC: 33.5 g/dL (ref 30.0–36.0)
MCV: 85.7 fL (ref 80.0–100.0)
Platelets: 175 10*3/uL (ref 150–400)
RBC: 3.21 MIL/uL — ABNORMAL LOW (ref 3.87–5.11)
RDW: 16.2 % — ABNORMAL HIGH (ref 11.5–15.5)
WBC: 7.3 10*3/uL (ref 4.0–10.5)
nRBC: 0 % (ref 0.0–0.2)

## 2023-04-30 LAB — PHOSPHORUS: Phosphorus: 3.6 mg/dL (ref 2.5–4.6)

## 2023-04-30 LAB — GLUCOSE, CAPILLARY
Glucose-Capillary: 150 mg/dL — ABNORMAL HIGH (ref 70–99)
Glucose-Capillary: 165 mg/dL — ABNORMAL HIGH (ref 70–99)
Glucose-Capillary: 247 mg/dL — ABNORMAL HIGH (ref 70–99)

## 2023-04-30 LAB — MAGNESIUM: Magnesium: 2.5 mg/dL — ABNORMAL HIGH (ref 1.7–2.4)

## 2023-04-30 LAB — PARATHYROID HORMONE, INTACT (NO CA): PTH: 16 pg/mL (ref 15–65)

## 2023-04-30 MED ORDER — LANCET DEVICE MISC: 1.0000 | Freq: Two times a day (BID) | 2 refills | Status: AC

## 2023-04-30 MED ORDER — BLOOD GLUCOSE MONITORING SUPPL DEVI: 1.0000 | 0 refills | Status: AC

## 2023-04-30 MED ORDER — BLOOD GLUCOSE TEST VI STRP: 1.0000 | ORAL_STRIP | Freq: Two times a day (BID) | 1 refills | Status: DC
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
## 2023-04-30 NOTE — Plan of Care (Signed)
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
## 2023-05-01 ENCOUNTER — Telehealth: Payer: Self-pay | Admitting: *Deleted
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
## 2023-05-01 NOTE — Patient Instructions (Incomplete)
Blood work was ordered.       Medications changes include :   glimepiride 1 mg daily with breakfast    A referral was ordered diabetic education and someone will call you to schedule an appointment.     Return in about 4 weeks (around 05/30/2023) for follow up.     Diabetes Mellitus and Nutrition, Adult When you have diabetes, or diabetes mellitus, it is very important to have healthy eating habits because your blood sugar (glucose) levels are greatly affected by what you eat and drink. Eating healthy foods in the right amounts, at about the same times every day, can help you: Manage your blood glucose. Lower your risk of heart disease. Improve your blood pressure. Reach or maintain a healthy weight. What can affect my meal plan? Every person with diabetes is different, and each person has different needs for a meal plan. Your health care provider may recommend that you work with a dietitian to make a meal plan that is best for you. Your meal plan may vary depending on factors such as: The calories you need. The medicines you take. Your weight. Your blood glucose, blood pressure, and cholesterol levels. Your activity level. Other health conditions you have, such as heart or kidney disease. How do carbohydrates affect me? Carbohydrates, also called carbs, affect your blood glucose level more than any other type of food. Eating carbs raises the amount of glucose in your blood. It is important to know how many carbs you can safely have in each meal. This is different for every person. Your dietitian can help you calculate how many carbs you should have at each meal and for each snack. How does alcohol affect me? Alcohol can cause a decrease in blood glucose (hypoglycemia), especially if you use insulin or take certain diabetes medicines by mouth. Hypoglycemia can be a life-threatening condition. Symptoms of hypoglycemia, such as sleepiness, dizziness, and confusion, are  similar to symptoms of having too much alcohol. Do not drink alcohol if: Your health care provider tells you not to drink. You are pregnant, may be pregnant, or are planning to become pregnant. If you drink alcohol: Limit how much you have to: 0-1 drink a day for women. 0-2 drinks a day for men. Know how much alcohol is in your drink. In the [place] one drink equals one 12 oz bottle of beer (355 mL), one 5 oz glass of wine (148 mL), or one 1 oz glass of hard liquor (44 mL). Keep yourself hydrated with water, diet soda, or unsweetened iced tea. Keep in mind that regular soda, juice, and other mixers may contain a lot of sugar and must be counted as carbs. What are tips for following this plan?  Reading food labels Start by checking the serving size on the Nutrition Facts label of packaged foods and drinks. The number of calories and the amount of carbs, fats, and other nutrients listed on the label are based on one serving of the item. Many items contain more than one serving per package. Check the total grams (g) of carbs in one serving. Check the number of grams of saturated fats and trans fats in one serving. Choose foods that have a low amount or none of these fats. Check the number of milligrams (mg) of salt (sodium) in one serving. Most people should limit total sodium intake to less than 2,300 mg per day. Always check the nutrition information of foods labeled as "low-fat" or "nonfat." These  foods may be higher in added sugar or refined carbs and should be avoided. Talk to your dietitian to identify your daily goals for nutrients listed on the label. Shopping Avoid buying canned, pre-made, or processed foods. These foods tend to be high in fat, sodium, and added sugar. Shop around the outside edge of the grocery store. This is where you will most often find fresh fruits and vegetables, bulk grains, fresh meats, and fresh dairy products. Cooking Use low-heat cooking methods, such as  baking, instead of high-heat cooking methods, such as deep frying. Cook using healthy oils, such as olive, canola, or sunflower oil. Avoid cooking with butter, cream, or high-fat meats. Meal planning Eat meals and snacks regularly, preferably at the same times every day. Avoid going long periods of time without eating. Eat foods that are high in fiber, such as fresh fruits, vegetables, beans, and whole grains. Eat 4-6 oz (112-168 g) of lean protein each day, such as lean meat, chicken, fish, eggs, or tofu. One ounce (oz) (28 g) of lean protein is equal to: 1 oz (28 g) of meat, chicken, or fish. 1 egg.  cup (62 g) of tofu. Eat some foods each day that contain healthy fats, such as avocado, nuts, seeds, and fish. What foods should I eat? Fruits Berries. Apples. Oranges. Peaches. Apricots. Plums. Grapes. Mangoes. Papayas. Pomegranates. Kiwi. Cherries. Vegetables Leafy greens, including lettuce, spinach, kale, chard, collard greens, mustard greens, and cabbage. Beets. Cauliflower. Broccoli. Carrots. Green beans. Tomatoes. Peppers. Onions. Cucumbers. Brussels sprouts. Grains Whole grains, such as whole-wheat or whole-grain bread, crackers, tortillas, cereal, and pasta. Unsweetened oatmeal. Quinoa. Brown or wild rice. Meats and other proteins Seafood. Poultry without skin. Lean cuts of poultry and beef. Tofu. Nuts. Seeds. Dairy Low-fat or fat-free dairy products such as milk, yogurt, and cheese. The items listed above may not be a complete list of foods and beverages you can eat and drink. Contact a dietitian for more information. What foods should I avoid? Fruits Fruits canned with syrup. Vegetables Canned vegetables. Frozen vegetables with butter or cream sauce. Grains Refined white flour and flour products such as bread, pasta, snack foods, and cereals. Avoid all processed foods. Meats and other proteins Fatty cuts of meat. Poultry with skin. Breaded or fried meats. Processed meat. Avoid  saturated fats. Dairy Full-fat yogurt, cheese, or milk. Beverages Sweetened drinks, such as soda or iced tea. The items listed above may not be a complete list of foods and beverages you should avoid. Contact a dietitian for more information. Questions to ask a health care provider Do I need to meet with a certified diabetes care and education specialist? Do I need to meet with a dietitian? What number can I call if I have questions? When are the best times to check my blood glucose? Where to find more information: American Diabetes Association: diabetes.org Academy of Nutrition and Dietetics: eatright.[employer] of Diabetes and Digestive and Kidney Diseases: StageSync.si Association of Diabetes Care & Education Specialists: diabeteseducator.org Summary It is important to have healthy eating habits because your blood sugar (glucose) levels are greatly affected by what you eat and drink. It is important to use alcohol carefully. A healthy meal plan will help you manage your blood glucose and lower your risk of heart disease. Your health care provider may recommend that you work with a dietitian to make a meal plan that is best for you. This information is not intended to replace advice given to you by your health care provider.  Make sure you discuss any questions you have with your health care provider. Document Revised: 12/03/2019 Document Reviewed: 12/03/2019 Elsevier Patient Education  2024 [employer].

## 2023-05-02 ENCOUNTER — Encounter: Payer: Self-pay | Admitting: Internal Medicine

## 2023-05-02 ENCOUNTER — Other Ambulatory Visit: Payer: Self-pay | Admitting: Internal Medicine

## 2023-05-02 ENCOUNTER — Ambulatory Visit (INDEPENDENT_AMBULATORY_CARE_PROVIDER_SITE_OTHER): Payer: Medicare Other | Admitting: Internal Medicine

## 2023-05-02 VITALS — BP 116/68 | HR 80 | Temp 98.1°F | Ht 63.0 in | Wt 113.0 lb

## 2023-05-02 DIAGNOSIS — S42111D Displaced fracture of body of scapula, right shoulder, subsequent encounter for fracture with routine healing: Secondary | ICD-10-CM | POA: Diagnosis not present

## 2023-05-02 DIAGNOSIS — I10 Essential (primary) hypertension: Secondary | ICD-10-CM

## 2023-05-02 DIAGNOSIS — Y92009 Unspecified place in unspecified non-institutional (private) residence as the place of occurrence of the external cause: Secondary | ICD-10-CM

## 2023-05-02 DIAGNOSIS — N1832 Chronic kidney disease, stage 3b: Secondary | ICD-10-CM | POA: Diagnosis not present

## 2023-05-02 DIAGNOSIS — Z7984 Long term (current) use of oral hypoglycemic drugs: Secondary | ICD-10-CM | POA: Diagnosis not present

## 2023-05-02 DIAGNOSIS — W19XXXA Unspecified fall, initial encounter: Secondary | ICD-10-CM

## 2023-05-02 DIAGNOSIS — E782 Mixed hyperlipidemia: Secondary | ICD-10-CM

## 2023-05-02 DIAGNOSIS — K219 Gastro-esophageal reflux disease without esophagitis: Secondary | ICD-10-CM

## 2023-05-02 DIAGNOSIS — E1122 Type 2 diabetes mellitus with diabetic chronic kidney disease: Secondary | ICD-10-CM

## 2023-05-02 DIAGNOSIS — N1831 Chronic kidney disease, stage 3a: Secondary | ICD-10-CM | POA: Diagnosis not present

## 2023-05-02 LAB — COMPREHENSIVE METABOLIC PANEL
ALT: 14 U/L (ref 0–35)
AST: 18 U/L (ref 0–37)
Albumin: 4.1 g/dL (ref 3.5–5.2)
Alkaline Phosphatase: 121 U/L — ABNORMAL HIGH (ref 39–117)
BUN: 41 mg/dL — ABNORMAL HIGH (ref 6–23)
CO2: 20 meq/L (ref 19–32)
Calcium: 10.6 mg/dL — ABNORMAL HIGH (ref 8.4–10.5)
Chloride: 109 meq/L (ref 96–112)
Creatinine, Ser: 1.51 mg/dL — ABNORMAL HIGH (ref 0.40–1.20)
GFR: 33.49 mL/min — ABNORMAL LOW (ref >60.00)
Glucose, Bld: 297 mg/dL — ABNORMAL HIGH (ref 70–99)
Potassium: 5 meq/L (ref 3.5–5.1)
Sodium: 139 meq/L (ref 135–145)
Total Bilirubin: 0.5 mg/dL (ref 0.2–1.2)
Total Protein: 7.8 g/dL (ref 6.0–8.3)

## 2023-05-02 LAB — CBC WITH DIFFERENTIAL/PLATELET
Basophils Absolute: 0.1 10*3/uL (ref 0.0–0.1)
Basophils Relative: 0.5 % (ref 0.0–3.0)
Eosinophils Absolute: 0.3 10*3/uL (ref 0.0–0.7)
Eosinophils Relative: 2.7 % (ref 0.0–5.0)
HCT: 33.6 % — ABNORMAL LOW (ref 36.0–46.0)
Hemoglobin: 11.1 g/dL — ABNORMAL LOW (ref 12.0–15.0)
Lymphocytes Relative: 18.1 % (ref 12.0–46.0)
Lymphs Abs: 2.1 10*3/uL (ref 0.7–4.0)
MCHC: 33 g/dL (ref 30.0–36.0)
MCV: 86.7 fL (ref 78.0–100.0)
Monocytes Absolute: 0.6 10*3/uL (ref 0.1–1.0)
Monocytes Relative: 5.4 % (ref 3.0–12.0)
Neutro Abs: 8.6 10*3/uL — ABNORMAL HIGH (ref 1.4–7.7)
Neutrophils Relative %: 73.3 % (ref 43.0–77.0)
Platelets: 276 10*3/uL (ref 150.0–400.0)
RBC: 3.88 Mil/uL (ref 3.87–5.11)
RDW: 17.3 % — ABNORMAL HIGH (ref 11.5–15.5)
WBC: 11.7 10*3/uL — ABNORMAL HIGH (ref 4.0–10.5)

## 2023-05-02 LAB — MICROALBUMIN / CREATININE URINE RATIO
Creatinine,U: 77.1 mg/dL
Microalb Creat Ratio: 6.5 mg/g (ref 0.0–30.0)
Microalb, Ur: 5 mg/dL — ABNORMAL HIGH (ref 0.0–1.9)

## 2023-05-02 MED ORDER — FAMOTIDINE 20 MG PO TABS: 20.0000 mg | ORAL_TABLET | Freq: Two times a day (BID) | ORAL | Status: DC

## 2023-05-02 MED ORDER — GLIMEPIRIDE 1 MG PO TABS: 1.0000 mg | ORAL_TABLET | Freq: Every day | ORAL | 1 refills | Status: DC
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
## 2023-05-02 NOTE — Assessment & Plan Note (Signed)
Chronic  Lab Results  Component Value Date   HGBA1C 7.2 (H) 04/29/2023   Sugars not controlled and variable Stressed compliance with a diabetic diet and eating regularly Check A1c today Discussed the difficulty with medication-did not want to decrease her appetite because of her significant weight loss, run the risk of decreasing sugars or injuring her kidneys more Start glimepiride 1 mg daily-May need to be titrated If her kidney function improves may be able to get her back on Farxiga Follow-up in 1 month Referral to diabetic education-she is not sure how she is supposed to be eating even though she has seen nutritionist in the past

## 2023-05-02 NOTE — Assessment & Plan Note (Signed)
Chronic Blood pressure well controlled CMP Continue Coreg 3.125 mg twice daily

## 2023-05-02 NOTE — Addendum Note (Signed)
Addended by: Pincus Sanes on: 05/02/2023 08:57 PM   Modules accepted: Orders

## 2023-05-02 NOTE — Assessment & Plan Note (Signed)
Chronic Continue rosuvastatin 40 mg daily

## 2023-05-02 NOTE — Assessment & Plan Note (Addendum)
Acute on chronic CMP today

## 2023-05-02 NOTE — Assessment & Plan Note (Signed)
Chronic Continue Pepcid 20 mg twice daily

## 2023-05-03 LAB — CULTURE, BLOOD (ROUTINE X 2)
Culture: NO GROWTH
Culture: NO GROWTH
Special Requests: ADEQUATE
Special Requests: ADEQUATE

## 2023-05-08 ENCOUNTER — Telehealth: Payer: Self-pay | Admitting: Radiology

## 2023-05-08 NOTE — Telephone Encounter (Signed)
Copied from CRM [id number]. Topic: General - Other >> May 08, 2023 11:15 AM Dimitri Ped wrote: Reason for CRM: Ms Kathryn Logan from [employer] billing center needing to know if patient is on insulin call back [phone] option 3

## 2023-05-10 ENCOUNTER — Other Ambulatory Visit: Payer: Self-pay | Admitting: *Deleted

## 2023-05-10 NOTE — Patient Instructions (Signed)
Visit Information  Thank you for taking time to visit with me today. Please don't hesitate to contact me if I can be of assistance to you before our next scheduled telephone appointment.  Our next appointment is by telephone on Thursday 05/17/23 at 2:00 pm  Please call the care guide team at [phone] if you need to cancel or reschedule your appointment.   Following are the goals we discussed today:  Patient Goals/Self-Care Activities: Participate in Transition of Care Program/Attend TOC scheduled calls Take all medications as prescribed Attend all scheduled provider appointments Call provider office for new concerns or questions  Continue monitoring and writing down on paper all of your blood sugars at home and take them to your doctor office appointments with you Continue pacing activity as your recuperation from recent hospital visits continues  If you are experiencing a Mental Health or Behavioral Health Crisis or need someone to talk to, please  call the Suicide and Crisis Lifeline: 988 call the [place] National Suicide Prevention Lifeline: 334-544-8660 or TTY: 312-106-6849 TTY 640 486 1647) to talk to a trained counselor call 1-800-273-TALK (toll free, 24 hour hotline) go to [facility] [address], [place] [phone]) call the [facility] Crisis Line: [phone] call 911   Patient verbalizes understanding of instructions and care plan provided today and agrees to view in MyChart. Active MyChart status and patient understanding of how to access instructions and care plan via MyChart confirmed with patient.     Caryl Pina, RN, BSN, Media planner  Transitions of Care  [facility] - [facility] [phone]: direct office
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
## 2023-05-14 NOTE — Telephone Encounter (Signed)
Spoke with Kathryn Logan today and info updated.

## 2023-05-15 NOTE — Progress Notes (Deleted)
  Cardiology Office Note:  .   Date:  05/15/2023  ID:  Kathryn Logan, DOB [birth year], MRN [id number] PCP: Geofm Glade PARAS, MD  [facility] Providers Cardiologist:  Dr. Mona Finn to update primary MD,subspecialty MD or APP then REFRESH:1}   }   History of Present Illness: .   Kathryn Logan is a 77 y.o. female with history of orthostatic hypotension, CAD status post CABG in 2015 (LIMA to LAD, SVG to OM2, SVG to distal RCA (, type 2 diabetes, dyslipidemia, and inappropriate sinus tachycardia.  Other history includes chronic anxiety.  She was admitted on 04/30/2019 for for hypoglycemia.  She has a history of labile glycemic control.  Metformin  was held along with Farxiga .  She was to follow-up with PCP.  ROS: ***  Studies Reviewed: .        *** EKG Interpretation Date/Time:    Ventricular Rate:    PR Interval:    QRS Duration:    QT Interval:    QTC Calculation:   R Axis:      Text Interpretation:      Physical Exam:   VS:  There were no vitals taken for this visit.   Wt Readings from Last 3 Encounters:  05/02/23 113 lb (51.3 kg)  04/28/23 117 lb (53.1 kg)  03/27/23 128 lb 12 oz (58.4 kg)    GEN: Well nourished, well developed in no acute distress NECK: No JVD; No carotid bruits CARDIAC: ***RRR, no murmurs, rubs, gallops RESPIRATORY:  Clear to auscultation without rales, wheezing or rhonchi  ABDOMEN: Soft, non-tender, non-distended EXTREMITIES:  No edema; No deformity   ASSESSMENT AND PLAN: .   ***    {Are you ordering a CV Procedure (e.g. stress test, cath, DCCV, TEE, etc)?   Press F2        :[id number]}    Signed, Lamarr CHRISTELLA. Jerilynn CHOL, ANP, AACC

## 2023-05-17 ENCOUNTER — Other Ambulatory Visit: Payer: Self-pay | Admitting: *Deleted
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
## 2023-05-18 ENCOUNTER — Ambulatory Visit: Payer: No Typology Code available for payment source | Admitting: Adult Health

## 2023-05-24 ENCOUNTER — Other Ambulatory Visit: Payer: Self-pay | Admitting: *Deleted

## 2023-05-24 NOTE — Patient Instructions (Signed)
 Visit Information  Thank you for taking time to visit with me today. Please don't hesitate to contact me if I can be of assistance to you before our next scheduled telephone appointment.  Our next appointment is by telephone on Friday 06/01/23 at 10:00 am  Please call the care guide team at [phone] if you need to cancel or reschedule your appointment.   Following are the goals we discussed today:  Patient Goals/Self-Care Activities: Participate in Transition of Care Program/Attend TOC scheduled calls Take all medications as prescribed Attend all scheduled provider appointments Call provider office for new concerns or questions  Continue monitoring and writing down on paper all of your blood sugars at home and take them to your doctor office appointments with you-- for the next few weeks, please check your blood sugars twice a day: first thing in the morning BEFORE you eat/ take medications, and then again later in the day 2 hours after eating  If you are experiencing a Mental Health or Behavioral Health Crisis or need someone to talk to, please call the Suicide and Crisis Lifeline: 988 call the [place]  National Suicide Prevention Lifeline: 905-508-1069 or TTY: 585 647 4300 TTY 432-150-9789) to talk to a trained counselor call 1-800-273-TALK (toll free, 24 hour hotline) go to [facility] [address], [place] [phone]) call the [facility] Crisis Line: ([phone] call 911   Patient verbalizes understanding of instructions and care plan provided today and agrees to view in MyChart. Active MyChart status and patient understanding of how to access instructions and care plan via MyChart confirmed with patient.     [place] Rollyson Mckinney Kishia Shackett, RN, BSN, Media Planner  Transitions of Care  [facility] - [facility] [phone]: direct office
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
## 2023-05-28 ENCOUNTER — Ambulatory Visit: Payer: Self-pay | Admitting: Internal Medicine

## 2023-05-28 NOTE — Progress Notes (Signed)
 Subjective:    Patient ID: Kathryn Logan, female    DOB: [birth year], 78 y.o.   MRN: [id number]      HPI Kathryn Logan is here for  Chief Complaint  Patient presents with   Recurrent Skin Infections    Boil on left inner groin area   Medical Management of Chronic Issues  She is here today alone.   Also follow up of diabetes in two days which we will address today  Boil in groin/vagina x 3 weeks -  she states pain, redness, warmth and it has been draining blood and pus.  She has been applying neosporin.  It is not improving  Sugar in morning <150.  After lunch - 2 hrs ago - 200-300 (eats salmon, clam chowder, chicken noodle soup, 3-4 crackers, minestone soup)  Denies low sugars.   Lowest sugar 145.  Appetite is okay-sometimes has to force herself to eat.  Typically eats breakfast and lunch and at snack later in the day.     Medications and allergies reviewed with patient and updated if appropriate.  Current Outpatient Medications on File Prior to Visit  Medication Sig Dispense Refill   Accu-Chek Softclix Lancets lancets 2 (two) times daily.     aspirin  EC 81 MG tablet Take 81 mg by mouth daily.      Blood Glucose Monitoring Suppl DEVI 1 each by Does not apply route as directed. May substitute to any manufacturer covered by patient's insurance. 1 kit 0   carvedilol  (COREG ) 3.125 MG tablet TAKE 1 TABLET(3.125 MG) BY MOUTH TWICE DAILY 180 tablet 0   famotidine  (PEPCID ) 20 MG tablet Take 1 tablet (20 mg total) by mouth 2 (two) times daily.     glimepiride  (AMARYL ) 1 MG tablet TAKE 1 TABLET(1 MG) BY MOUTH DAILY WITH BREAKFAST 90 tablet 0   Glucose Blood (BLOOD GLUCOSE TEST STRIPS) STRP 1 each by In Vitro route 2 (two) times daily. May substitute to any manufacturer covered by patient's insurance. 200 strip 1   Lancet Device MISC 1 each by Does not apply route 2 (two) times daily. May substitute to any manufacturer covered by patient's insurance. 1 each 2   loratadine  (CLARITIN ) 10  MG tablet Take 1 tablet (10 mg total) by mouth daily. 90 tablet 3   rosuvastatin  (CRESTOR ) 40 MG tablet TAKE 1 TABLET BY MOUTH EVERY DAY 90 tablet 2   Blood Glucose Monitoring Suppl (ACCU-CHEK GUIDE ME) w/Device KIT USE AS DIRECTED EVERY DAY (Patient not taking: Reported on 05/29/2023)     No current facility-administered medications on file prior to visit.    Review of Systems  Constitutional:  Negative for fever.  Respiratory:  Negative for cough, shortness of breath and wheezing.   Cardiovascular:  Negative for chest pain, palpitations and leg swelling.  Neurological:  Negative for light-headedness and headaches.       Objective:   Vitals:   05/29/23 1052  BP: 110/78  Pulse: 65  Temp: 97.9 F (36.6 C)  SpO2: 98%   BP Readings from Last 3 Encounters:  05/29/23 110/78  05/02/23 116/68  04/30/23 98/65   Wt Readings from Last 3 Encounters:  05/29/23 112 lb (50.8 kg)  05/02/23 113 lb (51.3 kg)  04/28/23 117 lb (53.1 kg)   Body mass index is 19.84 kg/m.    Physical Exam Constitutional:      General: She is not in acute distress.    Appearance: Normal appearance.  HENT:     Head: Normocephalic  and atraumatic.  Eyes:     Conjunctiva/sclera: Conjunctivae normal.  Cardiovascular:     Rate and Rhythm: Normal rate and regular rhythm.     Heart sounds: Normal heart sounds.  Pulmonary:     Effort: Pulmonary effort is normal. No respiratory distress.     Breath sounds: Normal breath sounds. No wheezing.  Musculoskeletal:     Cervical back: Neck supple.     Right lower leg: No edema.     Left lower leg: No edema.  Lymphadenopathy:     Cervical: No cervical adenopathy.  Skin:    General: Skin is warm and dry.     Findings: No rash.  Neurological:     Mental Status: She is alert. Mental status is at baseline.  Psychiatric:        Mood and Affect: Mood normal.        Behavior: Behavior normal.         CrCl cannot be calculated (Patient's most recent lab result  is older than the maximum 21 days allowed.).    Assessment & Plan:    See Problem List for Assessment and Plan of chronic medical problems.

## 2023-05-28 NOTE — Telephone Encounter (Signed)
 Copied from CRM [id number]. Topic: Clinical - Red Word Triage >> May 28, 2023 11:38 AM Leotis ORN wrote: [facility] that prompted transfer to Nurse Triage: terrible pain in a boil  in a terrible place redness and hot to touch white around it about 3 weeks total duration.   Chief Complaint: Boil on groin Symptoms: Boil to vagina, draining blood and pus Frequency: Constant x3 weeks Pertinent Negatives: Patient denies fever Disposition: [] ED /[] Urgent Care (no appt availability in office) / [x] Appointment(In office/virtual)/ []  [place] [facility]/ [] Home Care/ [] Refused Recommended Disposition /[] [place] [facility]/ []  Follow-up with PCP Additional Notes: Patient reports that she has a boil on her vagina that began 3 weeks ago. She states she has been experiencing pain to the area and that it is red and warm to touch. She states it has also been draining blood and pus. She states she has been keeping the area clean and applying Neosporin without any marked improvement. Patient believes she needs another medication to help get rid of the boil, but is unsure of what she can take due to there diabetes. Home care advice given to the patient and appointment made for tomorrow. Patient advised to call back for any new or worsening symptoms.     Reason for Disposition  [1] Boil AND [2] diabetes mellitus or weak immune system (e.g., HIV positive, cancer chemo, splenectomy, organ transplant, chronic steroids)  Answer Assessment - Initial Assessment Questions 1. APPEARANCE of BOIL: What does the boil look like?      Redness, hot to touch  2. LOCATION: Where is the boil located?      Vagina  3. NUMBER: How many boils are there?      1 4. SIZE: How big is the boil? (e.g., inches, cm; compare to size of a coin or other object)     1 inch 5. ONSET: When did the boil start?     3 weeks ago 6. PAIN: Is there any pain? If Yes, ask: How bad is the pain?   (Scale 1-10; or mild, moderate,  severe)     5/10 7. FEVER: Do you have a fever? If Yes, ask: What is it, how was it measured, and when did it start?      No 8. SOURCE: Have you been around anyone with boils or other Staph infections? Have you ever had boils before?     Unsure, never had one before  9. OTHER SYMPTOMS: Do you have any other symptoms? (e.g., shaking chills, weakness, rash elsewhere on body)      No 10. PREGNANCY: Is there any chance you are pregnant? When was your last menstrual period?       No  Protocols used: Boil (Skin Abscess)-A-AH

## 2023-05-28 NOTE — Patient Instructions (Addendum)
      Blood work was ordered.       Medications changes include :   doxycyline twice daily and augmentin twice daily       Return for cancel appt 1/16, followup in 10 days.

## 2023-05-29 ENCOUNTER — Encounter: Payer: Self-pay | Admitting: Internal Medicine

## 2023-05-29 ENCOUNTER — Ambulatory Visit (INDEPENDENT_AMBULATORY_CARE_PROVIDER_SITE_OTHER): Payer: Medicare Other | Admitting: Internal Medicine

## 2023-05-29 VITALS — BP 110/78 | HR 65 | Temp 97.9°F | Ht 63.0 in | Wt 112.0 lb

## 2023-05-29 DIAGNOSIS — Z7984 Long term (current) use of oral hypoglycemic drugs: Secondary | ICD-10-CM

## 2023-05-29 DIAGNOSIS — E782 Mixed hyperlipidemia: Secondary | ICD-10-CM | POA: Diagnosis not present

## 2023-05-29 DIAGNOSIS — K219 Gastro-esophageal reflux disease without esophagitis: Secondary | ICD-10-CM

## 2023-05-29 DIAGNOSIS — E1122 Type 2 diabetes mellitus with diabetic chronic kidney disease: Secondary | ICD-10-CM

## 2023-05-29 DIAGNOSIS — L02219 Cutaneous abscess of trunk, unspecified: Secondary | ICD-10-CM | POA: Diagnosis not present

## 2023-05-29 DIAGNOSIS — N1832 Chronic kidney disease, stage 3b: Secondary | ICD-10-CM | POA: Diagnosis not present

## 2023-05-29 DIAGNOSIS — I10 Essential (primary) hypertension: Secondary | ICD-10-CM | POA: Diagnosis not present

## 2023-05-29 LAB — LIPID PANEL
Cholesterol: 125 mg/dL (ref 0–200)
HDL: 30 mg/dL — ABNORMAL LOW (ref >39.00)
LDL Cholesterol: 59 mg/dL (ref 0–99)
NonHDL: 95.26
Total CHOL/HDL Ratio: 4
Triglycerides: 182 mg/dL — ABNORMAL HIGH (ref 0.0–149.0)
VLDL: 36.4 mg/dL (ref 0.0–40.0)

## 2023-05-29 LAB — COMPREHENSIVE METABOLIC PANEL
ALT: 9 U/L (ref 0–35)
AST: 15 U/L (ref 0–37)
Albumin: 4.2 g/dL (ref 3.5–5.2)
Alkaline Phosphatase: 100 U/L (ref 39–117)
BUN: 45 mg/dL — ABNORMAL HIGH (ref 6–23)
CO2: 20 meq/L (ref 19–32)
Calcium: 10.2 mg/dL (ref 8.4–10.5)
Chloride: 110 meq/L (ref 96–112)
Creatinine, Ser: 1.22 mg/dL — ABNORMAL HIGH (ref 0.40–1.20)
GFR: 43.24 mL/min — ABNORMAL LOW (ref >60.00)
Glucose, Bld: 234 mg/dL — ABNORMAL HIGH (ref 70–99)
Potassium: 3.9 meq/L (ref 3.5–5.1)
Sodium: 141 meq/L (ref 135–145)
Total Bilirubin: 0.5 mg/dL (ref 0.2–1.2)
Total Protein: 7.4 g/dL (ref 6.0–8.3)

## 2023-05-29 LAB — HEMOGLOBIN A1C: Hgb A1c MFr Bld: 7.6 % — ABNORMAL HIGH (ref 4.6–6.5)

## 2023-05-29 MED ORDER — AMOXICILLIN-POT CLAVULANATE 500-125 MG PO TABS: 1.0000 | ORAL_TABLET | Freq: Two times a day (BID) | ORAL | 0 refills | Status: AC

## 2023-05-29 MED ORDER — DOXYCYCLINE HYCLATE 100 MG PO TABS: 100.0000 mg | ORAL_TABLET | Freq: Two times a day (BID) | ORAL | 0 refills | Status: AC

## 2023-05-29 MED ORDER — MUPIROCIN 2 % EX OINT: 1.0000 | TOPICAL_OINTMENT | Freq: Two times a day (BID) | CUTANEOUS | 0 refills | Status: DC

## 2023-05-29 NOTE — Assessment & Plan Note (Signed)
 Chronic  Lab Results  Component Value Date   HGBA1C 7.2 (H) 04/29/2023   Sugars not controlled and variable-morning sugars sound to be controlled, but sugars increase after lunch more than ideal Stressed compliance with a diabetic diet and eating regularly Check A1c today Continue glimepiride  1 mg daily Based on and what A1c is we will plan on increasing glimepiride 

## 2023-05-29 NOTE — Assessment & Plan Note (Signed)
 Chronic Controlled Continue Pepcid 20 mg twice daily

## 2023-05-29 NOTE — Assessment & Plan Note (Signed)
 Acute She states symptoms started about 3 weeks ago Has swelling, redness, induration approximately the size of an orange with central area ulcerated and draining bloody pus Area is tender on exam Warm compresses Start doxycycline  100 mg twice daily x 10 days, Augmentin  500-125 mg twice daily x 10 days Bactroban  ointment twice daily Follow-up in 10 days, sooner if there is no improvement

## 2023-05-29 NOTE — Assessment & Plan Note (Signed)
Chronic Blood pressure well controlled CMP Continue Coreg 3.125 mg twice daily

## 2023-05-29 NOTE — Assessment & Plan Note (Signed)
 Chronic Check CMP, lipid panel Continue rosuvastatin 40 mg daily

## 2023-05-29 NOTE — Assessment & Plan Note (Signed)
 Chronic Encouraged increased fluids CMP

## 2023-05-30 MED ORDER — GLIMEPIRIDE 2 MG PO TABS: 2.0000 mg | ORAL_TABLET | Freq: Every day | ORAL | 5 refills | Status: DC

## 2023-05-30 NOTE — Addendum Note (Signed)
 Addended by: Colene Dauphin on: 05/30/2023 07:53 PM   Modules accepted: Orders

## 2023-05-31 ENCOUNTER — Ambulatory Visit: Payer: Medicare Other | Admitting: Internal Medicine

## 2023-06-01 ENCOUNTER — Other Ambulatory Visit: Payer: Self-pay | Admitting: *Deleted
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
## 2023-06-01 NOTE — Patient Instructions (Signed)
Visit Information  Thank you for taking time to visit with me today. Please don't hesitate to contact me if I can be of assistance to you before our next scheduled telephone appointment.  Your next appointment is by telephone on Wednesday 06/13/23 at 10:00 am with the new nurse care manager, Donn Pierini  Please call the care guide team at [phone] if you need to cancel or reschedule your appointment.   Following are the goals we discussed today:  Patient Goals/Self-Care Activities: Take all medications as prescribed Attend all scheduled provider appointments Call provider office for new concerns or questions  Continue monitoring and writing down on paper all of your blood sugars at home and take them to your doctor office appointments with you-- please continue to check your blood sugars twice a day: first thing in the morning BEFORE you eat/ take medications, and then again later in the day 2 hours after eating  If you are experiencing a Mental Health or Behavioral Health Crisis or need someone to talk to, please  call the Suicide and Crisis Lifeline: 988 call the [place] National Suicide Prevention Lifeline: 937-526-7829 or TTY: (506) 029-8921 TTY (808) 404-5410) to talk to a trained counselor call 1-800-273-TALK (toll free, 24 hour hotline) go to [facility] [address] [place] [phone]) call the [facility] Crisis Line: [phone] call 911   Patient verbalizes understanding of instructions and care plan provided today and agrees to view in MyChart. Active MyChart status and patient understanding of how to access instructions and care plan via MyChart confirmed with patient.     Caryl Pina, RN, BSN, Media planner  Transitions of Care  VBCI - [facility] [phone]: direct office

## 2023-06-05 NOTE — Progress Notes (Deleted)
  Cardiology Office Note:  .   Date:  06/05/2023  ID:  Keith Rake, DOB 01/21/1946, MRN [id number] PCP: Pincus Sanes, MD  [facility] Providers Cardiologist:  Dr. Antonieta Loveless to update primary MD,subspecialty MD or APP then REFRESH:1}   }   History of Present Illness: .   Kathryn Logan is a 78 y.o. female with history of orthostatic hypotension, CAD status post CABG in 2015 (LIMA to LAD, SVG to OM2, SVG to distal RCA (, type 2 diabetes, dyslipidemia, and inappropriate sinus tachycardia.  Other history includes chronic anxiety.  She was admitted on 04/30/2019 for for hypoglycemia.  She has a history of labile glycemic control.  Metformin was held along with [place].  She was to follow-up with PCP.  ROS: ***  Studies Reviewed: .        *** EKG Interpretation Date/Time:    Ventricular Rate:    PR Interval:    QRS Duration:    QT Interval:    QTC Calculation:   R Axis:      Text Interpretation:      Physical Exam:   VS:  There were no vitals taken for this visit.   Wt Readings from Last 3 Encounters:  05/29/23 112 lb (50.8 kg)  05/02/23 113 lb (51.3 kg)  04/28/23 117 lb (53.1 kg)    GEN: Well nourished, well developed in no acute distress NECK: No JVD; No carotid bruits CARDIAC: ***RRR, no murmurs, rubs, gallops RESPIRATORY:  Clear to auscultation without rales, wheezing or rhonchi  ABDOMEN: Soft, non-tender, non-distended EXTREMITIES:  No edema; No deformity   ASSESSMENT AND PLAN: .   ***    {Are you ordering a CV Procedure (e.g. stress test, cath, DCCV, TEE, etc)?   Press F2        :[id number]}    Signed, Bettey Mare. Liborio Nixon, ANP, AACC

## 2023-06-08 ENCOUNTER — Ambulatory Visit: Payer: Medicare Other | Admitting: Internal Medicine

## 2023-06-08 ENCOUNTER — Ambulatory Visit: Payer: No Typology Code available for payment source | Admitting: Adult Health
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
## 2023-06-13 ENCOUNTER — Ambulatory Visit: Payer: Medicare Other | Attending: Physician Assistant | Admitting: Physician Assistant

## 2023-06-13 ENCOUNTER — Encounter: Payer: Self-pay | Admitting: Physician Assistant

## 2023-06-13 ENCOUNTER — Ambulatory Visit: Payer: Self-pay

## 2023-06-13 VITALS — BP 114/82 | HR 105 | Ht 62.0 in | Wt 113.6 lb

## 2023-06-13 DIAGNOSIS — I2510 Atherosclerotic heart disease of native coronary artery without angina pectoris: Secondary | ICD-10-CM | POA: Diagnosis not present

## 2023-06-13 DIAGNOSIS — R002 Palpitations: Secondary | ICD-10-CM | POA: Insufficient documentation

## 2023-06-13 DIAGNOSIS — I4711 Inappropriate sinus tachycardia, so stated: Secondary | ICD-10-CM | POA: Insufficient documentation

## 2023-06-13 DIAGNOSIS — E782 Mixed hyperlipidemia: Secondary | ICD-10-CM | POA: Diagnosis not present

## 2023-06-13 DIAGNOSIS — Z951 Presence of aortocoronary bypass graft: Secondary | ICD-10-CM | POA: Diagnosis not present

## 2023-06-13 MED ORDER — CARVEDILOL 3.125 MG PO TABS: 3.1250 mg | ORAL_TABLET | Freq: Two times a day (BID) | ORAL | 3 refills | Status: DC

## 2023-06-13 MED ORDER — ROSUVASTATIN CALCIUM 40 MG PO TABS: ORAL_TABLET | ORAL | 3 refills | Status: DC

## 2023-06-13 NOTE — Patient Instructions (Signed)
Visit Information  Thank you for taking time to visit with me today. Please don't hesitate to contact me if I can be of assistance to you.   Following are the goals we discussed today:  Continue to take medications as prescribed. Continue to attend provider visits as scheduled Continue to eat healthy, vegetables, lean meats, fruits, avoid saturated and transfats Contact provider with health questions or concerns as needed Continue to check blood sugar as recommended and notify provider if questions or concerns  Our next appointment is by telephone on 06/26/23 at 10 am  Please call the care guide team at [phone] if you need to cancel or reschedule your appointment.   If you are experiencing a Mental Health or Behavioral Health Crisis or need someone to talk to, please call the Suicide and Crisis Lifeline: 988 call the [place] National Suicide Prevention Lifeline: (330) 660-6950 or TTY: (425)334-3843 TTY (680)404-8867) to talk to a trained counselor  [name], RN, MSN, BSN, CCM [place]  [facility], Population Health Case Manager Phone: [phone]

## 2023-06-13 NOTE — Patient Instructions (Signed)
Medication Instructions:  Your physician recommends that you continue on your current medications as directed. Please refer to the Current Medication list given to you today.  *If you need a refill on your cardiac medications before your next appointment, please call your pharmacy*   Follow-Up: At [facility], you and your health needs are our priority.  As part of our continuing mission to provide you with exceptional heart care, we have created designated Provider Care Teams.  These Care Teams include your primary Cardiologist (physician) and Advanced Practice Providers (APPs -  Physician Assistants and Nurse Practitioners) who all work together to provide you with the care you need, when you need it.    Your next appointment:   12 month(s)  Provider:   Chrystie Nose, MD     Other Instructions

## 2023-06-13 NOTE — Patient Outreach (Signed)
  Care Coordination   Initial Visit Note   06/13/2023 Name: Kathryn Logan MRN: [id number] DOB: August 10, 1945  Kathryn Logan is a 78 y.o. year old female who sees Burns, Bobette Mo, MD for primary care. I spoke with  Kathryn Logan by phone today.  What matters to the patients health and wellness today?  Transition from [facility] program. Patient was admitted 04/28/23-04/30/23 with hypoglycemia. Kathryn Logan reports she is doing well. She continues to check blood sugar daily. She has not checked blood sugar this morning, but denies any problems with low blood sugar since medication change. Kathryn Logan reports she has a follow up appointment with PCP on 06/15/23. She completed a cardiology visit today-Per patient no medications changes and will follow up with cardiology in a year. She denies any questions or problems today. She expresses excitement about going to get her hair done today.   Goals Addressed             This Visit's Progress    Assist with health management       Interventions Today    Flowsheet Row Most Recent Value  Chronic Disease   Chronic disease during today's visit Diabetes, Hypertension (HTN)  General Interventions   General Interventions Discussed/Reviewed General Interventions Discussed, Durable Medical Equipment (DME), Doctor Visits  [Evaluation of current treatment plan for health condition and patient's adherence to plan.  discussed blood sugars with patient, reviewed scheduled appointments with patient]  Doctor Visits Discussed/Reviewed Doctor Visits Discussed, PCP, Specialist  Durable Medical Equipment (DME) BP Cuff, Glucomoter  [patient reports has ordered a BP monitor and is awariting its arrival]  PCP/Specialist Visits Compliance with follow-up visit  [reviewed upcoming/scheduled appointments with patient.]  Education Interventions   Education Provided Provided Education  Provided Verbal Education On Blood Sugar Monitoring, Medication, When to see the doctor   [advised to continue to take medications as prescribed, attend provider visits as scheduled, check blood sugar as recommended]  Nutrition Interventions   Nutrition Discussed/Reviewed Nutrition Discussed  [upcoming appointment with nutrition/diabetes educator]  Pharmacy Interventions   Pharmacy Dicussed/Reviewed Pharmacy Topics Discussed            SDOH assessments and interventions completed:  No, recently completed.   Care Coordination Interventions:  Yes, provided   Follow up plan: Follow up call scheduled for 06/26/23    Encounter Outcome:  Patient Visit Completed   Kathryn Sheriff, RN, MSN, BSN, CCM [place]  [facility], Population Health Case Manager Phone: [phone]

## 2023-06-14 ENCOUNTER — Encounter: Payer: Self-pay | Admitting: Internal Medicine

## 2023-06-14 NOTE — Progress Notes (Signed)
Subjective:    Patient ID: Kathryn Logan, female    DOB: [birth year], 78 y.o.   MRN: [id number]     HPI Kathryn Logan is here for follow up of her chronic medical problems.  Here for follow up from 10 days ago for suprapubic abscess - finished doxycycline, augmentin yesterday.  She states the area is better.  She still has an open wound and there is a little bit of bleeding on her pad.  It is tender.  She denies any fevers.  She has not seen any pus come out of it.   Sugars at home - "pretty good" but not taking them often.  Between 90-120.  She is eating regularly.    Medications and allergies reviewed with patient and updated if appropriate.  Current Outpatient Medications on File Prior to Visit  Medication Sig Dispense Refill   Accu-Chek Softclix Lancets lancets 2 (two) times daily.     aspirin EC 81 MG tablet Take 81 mg by mouth daily.      Blood Glucose Monitoring Suppl (ACCU-CHEK GUIDE ME) w/Device KIT      Blood Glucose Monitoring Suppl DEVI 1 each by Does not apply route as directed. May substitute to any manufacturer covered by patient's insurance. 1 kit 0   carvedilol (COREG) 3.125 MG tablet Take 1 tablet (3.125 mg total) by mouth 2 (two) times daily with a meal. 180 tablet 3   famotidine (PEPCID) 20 MG tablet Take 1 tablet (20 mg total) by mouth 2 (two) times daily.     glimepiride (AMARYL) 2 MG tablet Take 1 tablet (2 mg total) by mouth daily before breakfast. 30 tablet 5   Glucose Blood (BLOOD GLUCOSE TEST STRIPS) STRP 1 each by In Vitro route 2 (two) times daily. May substitute to any manufacturer covered by patient's insurance. 200 strip 1   loratadine (CLARITIN) 10 MG tablet Take 1 tablet (10 mg total) by mouth daily. 90 tablet 3   mupirocin ointment (BACTROBAN) 2 % Apply 1 Application topically 2 (two) times daily. 30 g 0   rosuvastatin (CRESTOR) 40 MG tablet TAKE 1 TABLET BY MOUTH EVERY DAY 90 tablet 3   No current facility-administered medications on file prior  to visit.     Review of Systems     Objective:   Vitals:   06/15/23 0906  BP: 110/78  Pulse: 89  Temp: 98.2 F (36.8 C)  SpO2: 98%   BP Readings from Last 3 Encounters:  06/15/23 110/78  06/13/23 114/82  05/29/23 110/78   Wt Readings from Last 3 Encounters:  06/15/23 114 lb (51.7 kg)  06/13/23 113 lb 9.6 oz (51.5 kg)  05/29/23 112 lb (50.8 kg)   Body mass index is 20.85 kg/m.    Physical Exam Constitutional:      General: She is not in acute distress.    Appearance: Normal appearance. She is not ill-appearing.  HENT:     Head: Normocephalic and atraumatic.  Skin:    General: Skin is warm and dry.     Comments: Suprapubic region with with the area about the size of a small orange of swelling and mild erythema with a central ulcer with healthy granulation tissue and no bleeding or discharge.  Surrounding area is slightly tender slightly warm to palpation  Neurological:     Mental Status: She is alert.        Lab Results  Component Value Date   WBC 11.7 (H) 05/02/2023   HGB  11.1 (L) 05/02/2023   HCT 33.6 (L) 05/02/2023   PLT 276.0 05/02/2023   GLUCOSE 234 (H) 05/29/2023   CHOL 125 05/29/2023   TRIG 182.0 (H) 05/29/2023   HDL 30.00 (L) 05/29/2023   LDLDIRECT 53.0 11/10/2020   LDLCALC 59 05/29/2023   ALT 9 05/29/2023   AST 15 05/29/2023   NA 141 05/29/2023   K 3.9 05/29/2023   CL 110 05/29/2023   CREATININE 1.22 (H) 05/29/2023   BUN 45 (H) 05/29/2023   CO2 20 05/29/2023   TSH 0.657 04/28/2023   INR 1.38 12/01/2013   HGBA1C 7.6 (H) 05/29/2023   MICROALBUR 5.0 (H) 05/02/2023     Assessment & Plan:    See Problem List for Assessment and Plan of chronic medical problems.

## 2023-06-14 NOTE — Patient Instructions (Addendum)
        Medications changes include :   doxycycline 100 mg twice daily x 1 week       Return in about 3 months (around 09/12/2023) for follow up.

## 2023-06-15 ENCOUNTER — Ambulatory Visit (INDEPENDENT_AMBULATORY_CARE_PROVIDER_SITE_OTHER): Payer: Medicare Other | Admitting: Internal Medicine

## 2023-06-15 VITALS — BP 110/78 | HR 89 | Temp 98.2°F | Ht 62.0 in | Wt 114.0 lb

## 2023-06-15 DIAGNOSIS — Z7984 Long term (current) use of oral hypoglycemic drugs: Secondary | ICD-10-CM

## 2023-06-15 DIAGNOSIS — L02219 Cutaneous abscess of trunk, unspecified: Secondary | ICD-10-CM

## 2023-06-15 DIAGNOSIS — N1832 Chronic kidney disease, stage 3b: Secondary | ICD-10-CM | POA: Diagnosis not present

## 2023-06-15 DIAGNOSIS — E1122 Type 2 diabetes mellitus with diabetic chronic kidney disease: Secondary | ICD-10-CM | POA: Diagnosis not present

## 2023-06-15 DIAGNOSIS — I10 Essential (primary) hypertension: Secondary | ICD-10-CM | POA: Diagnosis not present

## 2023-06-15 MED ORDER — DOXYCYCLINE HYCLATE 100 MG PO TABS: 100.0000 mg | ORAL_TABLET | Freq: Two times a day (BID) | ORAL | 0 refills | Status: AC

## 2023-06-15 NOTE — Assessment & Plan Note (Signed)
Subacute Completed 10 days of Augmentin and doxycycline and there has been significant improvement, but am concerned there still is some residual infection Open ulcer looks good-there is good granulation tissue without bleeding or discharge Surrounding area does have some swelling and mild erythema Start doxycycline 100 mg twice daily x 7 days Advised her to monitor this closely If it does not completely resolve then she needs to come back in for evaluation

## 2023-06-15 NOTE — Assessment & Plan Note (Signed)
Chronic A1c 2 weeks ago 7.6 which is adequately controlled, but would like it a little bit lower Glimepiride increased to 2 mg daily which she seems to be doing okay with Sugars at home seem to be controlled-90-20 fasting Advised to occasionally check sugar 2 hours after eating Will hold off on making any changes at this time-depending on blood work at her next visit may revise medication-follow-up in 3 months, sooner if needed

## 2023-06-15 NOTE — Assessment & Plan Note (Signed)
Chronic Blood pressure well controlled Continue Coreg 3.125 mg twice daily 

## 2023-06-26 ENCOUNTER — Ambulatory Visit: Payer: Self-pay

## 2023-06-26 NOTE — Patient Instructions (Signed)
Visit Information  Thank you for taking time to visit with me today. Please don't hesitate to contact me if I can be of assistance to you.   Following are the goals we discussed today:  Continue to take medications as prescribed. Continue to attend provider visits as scheduled Continue to eat healthy, lean meats, vegetables, fruits, avoid saturated and transfats Contact provider with health questions or concerns as needed   If you are experiencing a Mental Health or Behavioral Health Crisis or need someone to talk to, please call the Suicide and Crisis Lifeline: 988 call the [place] National Suicide Prevention Lifeline: (508)024-5287 or TTY: 303-095-2051 TTY 5314533183) to talk to a trained counselor  [name], RN, MSN, BSN, CCM [place]  [facility], Population Health Case Manager Phone: [phone]

## 2023-06-26 NOTE — Patient Outreach (Signed)
  Care Coordination   Follow Up Visit Note   06/26/2023 Name: Kathryn Logan MRN: [id number] DOB: June 19, 1945  Kathryn Logan is a 78 y.o. year old female who sees Burns, Bobette Mo, MD for primary care. I spoke with  Kathryn Logan by phone today.  What matters to the patients health and wellness today?  [name] denies any needs at this time and declines any further participation. RNCM encouraged patient to contact RNCM or PCP if care management needs in the future.  Goals Addressed             This Visit's Progress    COMPLETED: Assist with health management       Interventions Today    Flowsheet Row Most Recent Value  Chronic Disease   Chronic disease during today's visit Diabetes, Hypertension (HTN)  General Interventions   General Interventions Discussed/Reviewed General Interventions Reviewed  [Reviewed care management program. encouraged patient to contact RNCM or PCP if care managment needs in the future.]  PCP/Specialist Visits Compliance with follow-up visit  [encouraged patient to follow up with providers as recommended]  Pharmacy Interventions   Pharmacy Dicussed/Reviewed Pharmacy Topics Reviewed  Earnest Bailey if any medications needs. patient denies]            SDOH assessments and interventions completed:  No  Care Coordination Interventions:  Yes, provided   Follow up plan: No further intervention required.   Encounter Outcome:  Patient Visit Completed   [name], RN, MSN, BSN, CCM [place]  [facility], Population Health Case Manager Phone: [phone]

## 2023-06-28 ENCOUNTER — Ambulatory Visit: Payer: Medicare Other | Admitting: Dietician

## 2023-07-28 ENCOUNTER — Other Ambulatory Visit: Payer: Self-pay | Admitting: Internal Medicine

## 2023-08-02 ENCOUNTER — Ambulatory Visit: Payer: Medicare Other | Admitting: Neurology

## 2023-09-04 ENCOUNTER — Ambulatory Visit (INDEPENDENT_AMBULATORY_CARE_PROVIDER_SITE_OTHER)

## 2023-09-04 ENCOUNTER — Telehealth: Payer: Self-pay | Admitting: Internal Medicine

## 2023-09-04 VITALS — Ht 62.0 in | Wt 113.0 lb

## 2023-09-04 DIAGNOSIS — M8580 Other specified disorders of bone density and structure, unspecified site: Secondary | ICD-10-CM | POA: Diagnosis not present

## 2023-09-04 DIAGNOSIS — Z780 Asymptomatic menopausal state: Secondary | ICD-10-CM

## 2023-09-04 DIAGNOSIS — Z0000 Encounter for general adult medical examination without abnormal findings: Secondary | ICD-10-CM

## 2023-09-04 NOTE — Patient Instructions (Addendum)
 Kathryn Logan , Thank you for taking time to come for your Medicare Wellness Visit. I appreciate your ongoing commitment to your health goals. Please review the following plan we discussed and let me know if I can assist you in the future.   Referrals/Orders/Follow-Ups/Clinician Recommendations: Aim for 30 minutes of exercise or brisk walking, 6-8 glasses of water, and 5 servings of fruits and vegetables each day. Ordered a DEXA Scan (Bone Density).    This is a list of the screening recommended for you and due dates:  Health Maintenance  Topic Date Due   DEXA scan (bone density measurement)  09/11/2014   Complete foot exam   01/02/2020   Eye exam for diabetics  02/16/2022   COVID-19 Vaccine (5 - 2024-25 season) 01/14/2023   Hemoglobin A1C  11/26/2023   Flu Shot  12/14/2023   Yearly kidney health urinalysis for diabetes  05/01/2024   Yearly kidney function blood test for diabetes  05/28/2024   Medicare Annual Wellness Visit  09/03/2024   DTaP/Tdap/Td vaccine (3 - Td or Tdap) 03/21/2033   Pneumonia Vaccine  Completed   Hepatitis C Screening  Completed   HPV Vaccine  Aged Out   Meningitis B Vaccine  Aged Out   Zoster (Shingles) Vaccine  Discontinued    Advanced directives: (Provided) Advance directive discussed with you today. I have provided a copy for you to complete at home and have notarized. Once this is complete, please bring a copy in to our office so we can scan it into your chart.   Next Medicare Annual Wellness Visit scheduled for next year: Yes

## 2023-09-04 NOTE — Progress Notes (Signed)
 Subjective:   Kathryn Logan is a 78 y.o. who presents for a Medicare Wellness preventive visit.  Visit Complete: Virtual I connected with  Kathryn Logan on 09/04/23 by a audio enabled telemedicine application and verified that I am speaking with the correct person using two identifiers.  Patient Location: Home  Provider Location: Office/Clinic  I discussed the limitations of evaluation and management by telemedicine. The patient expressed understanding and agreed to proceed.  Vital Signs: Because this visit was a virtual/telehealth visit, some criteria may be missing or patient reported. Any vitals not documented were not able to be obtained and vitals that have been documented are patient reported.  VideoDeclined- This patient declined Librarian, academic. Therefore the visit was completed with audio only.  Persons Participating in Visit: Patient.  AWV Questionnaire: No: Patient Medicare AWV questionnaire was not completed prior to this visit.  Cardiac Risk Factors include: advanced age (>78men, >54 women);diabetes mellitus;hypertension;dyslipidemia     Objective:    Today's Vitals   09/04/23 1009  Weight: 113 lb (51.3 kg)  Height: 5\' 2"  (1.575 m)   Body mass index is 20.67 kg/m.     09/04/2023   10:08 AM 04/29/2023    9:56 AM 04/28/2023    6:25 PM 03/23/2023   12:00 PM 10/02/2022   10:16 AM 06/09/2021   10:08 AM 09/24/2019    8:29 AM  Advanced Directives  Does Patient Have a Medical Advance Directive? No  No No No No No  Would patient like information on creating a medical advance directive? Yes (MAU/Ambulatory/Procedural Areas - Information given) No - Patient declined  No - Patient declined Yes (ED - Information included in AVS) No - Patient declined Yes (ED - Information included in AVS)    Current Medications (verified) Outpatient Encounter Medications as of 09/04/2023  Medication Sig   Accu-Chek Softclix Lancets lancets 2 (two)  times daily.   aspirin  EC 81 MG tablet Take 81 mg by mouth daily.    Blood Glucose Monitoring Suppl (ACCU-CHEK GUIDE ME) w/Device KIT    Blood Glucose Monitoring Suppl DEVI 1 each by Does not apply route as directed. May substitute to any manufacturer covered by patient's insurance.   carvedilol  (COREG ) 3.125 MG tablet Take 1 tablet (3.125 mg total) by mouth 2 (two) times daily with a meal.   famotidine  (PEPCID ) 20 MG tablet TAKE 1 TABLET BY MOUTH TWICE DAILY   glimepiride  (AMARYL ) 2 MG tablet Take 1 tablet (2 mg total) by mouth daily before breakfast.   Glucose Blood (BLOOD GLUCOSE TEST STRIPS) STRP 1 each by In Vitro route 2 (two) times daily. May substitute to any manufacturer covered by patient's insurance.   loratadine  (CLARITIN ) 10 MG tablet Take 1 tablet (10 mg total) by mouth daily.   mupirocin  ointment (BACTROBAN ) 2 % Apply 1 Application topically 2 (two) times daily.   rosuvastatin  (CRESTOR ) 40 MG tablet TAKE 1 TABLET BY MOUTH EVERY DAY   No facility-administered encounter medications on file as of 09/04/2023.    Allergies (verified) Patient has no known allergies.   History: Past Medical History:  Diagnosis Date   Compression fracture of L1 lumbar vertebra (HCC) 11/21/2017   Diabetes mellitus without complication (HCC)    Gallbladder sludge    GERD (gastroesophageal reflux disease)    Heart disease    Hyperlipidemia    NSTEMI (non-ST elevated myocardial infarction) (HCC) 11/2013   Osteopenia    Syncope 11/2017   Past Surgical History:  Procedure  Laterality Date   CORONARY ARTERY BYPASS GRAFT N/A 12/01/2013   Procedure: CORONARY ARTERY BYPASS GRAFTING (CABG) x three, using left internal mammary artery, and right leg saphenous vein harvested endoscopically (LIMA to LAD, SVG to OM2 , SVG to dRCA);  Surgeon: Norita Beauvais, MD;  Location: [facility] OR;  Service: Open Heart Surgery;  Laterality: N/A;   G 1 P 1     INTRAOPERATIVE TRANSESOPHAGEAL ECHOCARDIOGRAM N/A 12/01/2013    Procedure: INTRAOPERATIVE TRANSESOPHAGEAL ECHOCARDIOGRAM;  Surgeon: Norita Beauvais, MD;  Location: [facility] OR;  Service: Open Heart Surgery;  Laterality: N/A;   LEFT HEART CATHETERIZATION WITH CORONARY ANGIOGRAM N/A 11/27/2013   Procedure: LEFT HEART CATHETERIZATION WITH CORONARY ANGIOGRAM;  Surgeon: Mickiel Albany, MD;  Location: [facility] LAB;  Service: Cardiovascular;  Laterality: N/A;   no colonoscopy     SOC reviewed   TONSILLECTOMY AND ADENOIDECTOMY     Family History  Problem Relation Age of Onset   Heart attack Mother 80   Diabetes Mother    Stroke Mother    Hypertension Mother    Goiter Mother    Heart attack Father        >55   Bladder Cancer Father    Parkinson's disease Maternal Aunt    Osteoporosis Sister    Diabetes Brother    Social History   Socioeconomic History   Marital status: Married    Spouse name: Not on file   Number of children: Not on file   Years of education: Not on file   Highest education level: Not on file  Occupational History   Not on file  Tobacco Use   Smoking status: Never    Passive exposure: Never   Smokeless tobacco: Never  Vaping Use   Vaping status: Never Used  Substance and Sexual Activity   Alcohol use: Not Currently    Comment: Occasionally    Drug use: No   Sexual activity: Not on file  Other Topics Concern   Not on file  Social History Narrative   Married   Social Drivers of Health   Financial Resource Strain: Low Risk  (09/04/2023)   Overall Financial Resource Strain (CARDIA)    Difficulty of Paying Living Expenses: Not hard at all  Food Insecurity: No Food Insecurity (09/04/2023)   Hunger Vital Sign    Worried About Running Out of Food in the Last Year: Never true    Ran Out of Food in the Last Year: Never true  Transportation Needs: No Transportation Needs (09/04/2023)   PRAPARE - Administrator, Civil Service (Medical): No    Lack of Transportation (Non-Medical): No  Physical Activity: Insufficiently  Active (09/04/2023)   Exercise Vital Sign    Days of Exercise per Week: 2 days    Minutes of Exercise per Session: 20 min  Stress: No Stress Concern Present (09/04/2023)   [employer] of Occupational Health - Occupational Stress Questionnaire    Feeling of Stress : Not at all  Social Connections: Moderately Isolated (09/04/2023)   Social Connection and Isolation Panel [NHANES]    Frequency of Communication with Friends and Family: Three times a week    Frequency of Social Gatherings with Friends and Family: Three times a week    Attends Religious Services: Never    Active Member of Clubs or Organizations: No    Attends Banker Meetings: Never    Marital Status: Married    Tobacco Counseling Counseling given: No  Clinical Intake:  Pre-visit preparation completed: Yes  Pain : No/denies pain     BMI - recorded: 20.67 Nutritional Risks: None Diabetes: Yes CBG done?: Yes CBG resulted in Enter/ Edit results?: No Did pt. bring in CBG monitor from home?: No  Lab Results  Component Value Date   HGBA1C 7.6 (H) 05/29/2023   HGBA1C 7.2 (H) 04/29/2023   HGBA1C 9.4 (H) 03/23/2023     How often do you need to have someone help you when you read instructions, pamphlets, or other written materials from your doctor or pharmacy?: 1 - Never  Interpreter Needed?: No  Information entered by :: Kandy Orris, CMA   Activities of Daily Living     09/04/2023   10:16 AM 04/29/2023    9:48 AM  In your present state of health, do you have any difficulty performing the following activities:  Hearing? 0   Vision? 0   Difficulty concentrating or making decisions? 0   Walking or climbing stairs? 0   Dressing or bathing? 0   Doing errands, shopping? 0 1  Preparing Food and eating ? N   Using the Toilet? N   In the past six months, have you accidently leaked urine? Y   Comment wears a pantyline sometimes   Do you have problems with loss of bowel control? N    Managing your Medications? N   Managing your Finances? N   Housekeeping or managing your Housekeeping? N     Patient Care Team: Colene Dauphin, MD as PCP - General (Internal Medicine) Maximo Spar Aviva Lemmings, MD as PCP - Cardiology (Cardiology) Valarie Garner as Physician Assistant (Cardiology) Maximo Spar Aviva Lemmings, MD as Consulting Physician (Cardiology) Szabat, Tino Foreman, [facility] (Inactive) (Pharmacist) Dema Filler, MD as Consulting Physician (Ophthalmology)  Indicate any recent Medical Services you may have received from other than [facility] providers in the past year (date may be approximate).     Assessment:   This is a routine wellness examination for Sheilia.  Hearing/Vision screen Hearing Screening - Comments:: Denies hearing difficulties   Vision Screening - Comments:: Wears rx glasses - up to date with routine eye exams with Dr Dema Filler   Goals Addressed               This Visit's Progress     Patient Stated (pt-stated)        Patient stated she plans to stay active.       Depression Screen     09/04/2023   10:20 AM 06/15/2023    9:13 AM 10/02/2022   10:16 AM 04/11/2022   11:12 AM 06/09/2021   10:13 AM 11/10/2020    9:53 AM 09/24/2019    8:31 AM  PHQ 2/9 Scores  PHQ - 2 Score 0 0 0 0 0 0 0  PHQ- 9 Score 0          Fall Risk     09/04/2023   10:16 AM 06/15/2023    9:10 AM 10/02/2022   10:19 AM 04/11/2022   11:09 AM 06/09/2021   10:09 AM  Fall Risk   Falls in the past year? 0 0 0 0 0  Number falls in past yr: 0 0 0 0 0  Injury with Fall? 0 0 0 0 0  Risk for fall due to : No Fall Risks No Fall Risks No Fall Risks No Fall Risks No Fall Risks  Follow up Falls prevention discussed;Falls evaluation completed Falls evaluation completed Falls evaluation completed Falls  evaluation completed Falls evaluation completed    MEDICARE RISK AT HOME:  Medicare Risk at Home Any stairs in or around the home?: Yes (outside) If so, are there any without  handrails?: No Home free of loose throw rugs in walkways, pet beds, electrical cords, etc?: Yes Adequate lighting in your home to reduce risk of falls?: Yes Life alert?: No Use of a cane, walker or w/c?: No Grab bars in the bathroom?: Yes Shower chair or bench in shower?: Yes Elevated toilet seat or a handicapped toilet?: No  TIMED UP AND GO:  Was the test performed?  No  Cognitive Function: 6CIT completed        09/04/2023   10:19 AM 10/02/2022   10:20 AM 09/24/2019    8:33 AM  6CIT Screen  What Year? 0 points 0 points 0 points  What month? 0 points 0 points 0 points  What time? 0 points 0 points 0 points  Count back from 20 0 points 0 points 0 points  Months in reverse 2 points 0 points 0 points  Repeat phrase 0 points 0 points 0 points  Total Score 2 points 0 points 0 points    Immunizations Immunization History  Administered Date(s) Administered   Fluad Quad(high Dose 65+) 01/22/2019, 03/11/2020, 04/11/2022   Fluad Trivalent(High Dose 65+) 03/25/2023   Influenza, High Dose Seasonal PF 02/22/2016, 03/14/2017, 04/25/2018   Influenza,inj,Quad PF,6+ Mos 03/03/2014, 04/14/2015   Influenza-Unspecified 02/16/2017, 04/01/2021   PFIZER(Purple Top)SARS-COV-2 Vaccination 06/19/2019, 07/16/2019, 04/13/2020   PNEUMOCOCCAL CONJUGATE-20 04/11/2022   Pfizer Covid-19 Vaccine Bivalent Booster 42yrs & up 04/01/2021   Pneumococcal Conjugate-13 04/13/2017   Pneumococcal Polysaccharide-23 08/29/2012   Tdap 08/28/2012, 03/22/2023    Screening Tests Health Maintenance  Topic Date Due   DEXA SCAN  09/11/2014   FOOT EXAM  01/02/2020   OPHTHALMOLOGY EXAM  02/16/2022   COVID-19 Vaccine (5 - 2024-25 season) 01/14/2023   HEMOGLOBIN A1C  11/26/2023   INFLUENZA VACCINE  12/14/2023   Diabetic kidney evaluation - Urine ACR  05/01/2024   Diabetic kidney evaluation - eGFR measurement  05/28/2024   Medicare Annual Wellness (AWV)  09/03/2024   DTaP/Tdap/Td (3 - Td or Tdap) 03/21/2033    Pneumonia Vaccine 60+ Years old  Completed   Hepatitis C Screening  Completed   HPV VACCINES  Aged Out   Meningococcal B Vaccine  Aged Out   Zoster Vaccines- Shingrix  Discontinued    Health Maintenance  Health Maintenance Due  Topic Date Due   DEXA SCAN  09/11/2014   FOOT EXAM  01/02/2020   OPHTHALMOLOGY EXAM  02/16/2022   COVID-19 Vaccine (5 - 2024-25 season) 01/14/2023   Health Maintenance Items Addressed: DEXA ordered  Additional Screening:  Vision Screening: Recommended annual ophthalmology exams for early detection of glaucoma and other disorders of the eye.  Dental Screening: Recommended annual dental exams for proper oral hygiene  Community Resource Referral / Chronic Care Management: CRR required this visit?  No   CCM required this visit?  No     Plan:     I have personally reviewed and noted the following in the patient's chart:   Medical and social history Use of alcohol, tobacco or illicit drugs  Current medications and supplements including opioid prescriptions. Patient is not currently taking opioid prescriptions. Functional ability and status Nutritional status Physical activity Advanced directives List of other physicians Hospitalizations, surgeries, and ER visits in previous 12 months Vitals Screenings to include cognitive, depression, and falls Referrals and appointments  In addition, I have reviewed and discussed with patient certain preventive protocols, quality metrics, and best practice recommendations. A written personalized care plan for preventive services as well as general preventive health recommendations were provided to patient.     Patria Bookbinder, CMA   09/04/2023   After Visit Summary: (MyChart) Due to this being a telephonic visit, the after visit summary with patients personalized plan was offered to patient via MyChart   Notes: Nothing significant to report at this time.

## 2023-09-04 NOTE — Telephone Encounter (Signed)
 Copied from CRM [id number]. Topic: Clinical - Medication Question >> Sep 04, 2023 10:31 AM [place] La N wrote: Reason for CRM: Patient has a question about her glimepiride  (AMARYL ) 2 MG tablet, she stated Dr.Burns prescribed 4mg  and she stated the pharmacy gave her 2 mg and she is wanting to know why and want to make sure she is taking the right thing.

## 2023-09-04 NOTE — Telephone Encounter (Signed)
 Spoke with patient and she is to remain on the 2mg  until her next OV.

## 2023-09-11 NOTE — Progress Notes (Unsigned)
 Subjective:    Patient ID: Kathryn Logan, female    DOB: Jul 10, 1945, 78 y.o.   MRN: [id number]     HPI Kathryn Logan is here for follow up of her chronic medical problems.  She is taking 4mg  of the glimepiride .   A1c today is 9.1%.   she has not been checking her sugars.    Breakfast - protein shake Lunch - yesterday - fried chicken sandwich Dinner - did not eat   Last night had pops cereal for a snack  2-3 times a week - eats salmon, broccoli and mashed potatoes.  She eats salads.  She does not eat much.    No exercise.   Medications and allergies reviewed with patient and updated if appropriate.  Current Outpatient Medications on File Prior to Visit  Medication Sig Dispense Refill   Accu-Chek Softclix Lancets lancets 2 (two) times daily.     aspirin  EC 81 MG tablet Take 81 mg by mouth daily.      Blood Glucose Monitoring Suppl (ACCU-CHEK GUIDE ME) w/Device KIT      Blood Glucose Monitoring Suppl DEVI 1 each by Does not apply route as directed. May substitute to any manufacturer covered by patient's insurance. 1 kit 0   carvedilol  (COREG ) 3.125 MG tablet Take 1 tablet (3.125 mg total) by mouth 2 (two) times daily with a meal. 180 tablet 3   famotidine  (PEPCID ) 20 MG tablet TAKE 1 TABLET BY MOUTH TWICE DAILY 180 tablet 1   glimepiride  (AMARYL ) 4 MG tablet Take 4 mg by mouth daily with breakfast.     Glucose Blood (BLOOD GLUCOSE TEST STRIPS) STRP 1 each by In Vitro route 2 (two) times daily. May substitute to any manufacturer covered by patient's insurance. 200 strip 1   loratadine  (CLARITIN ) 10 MG tablet Take 1 tablet (10 mg total) by mouth daily. 90 tablet 3   mupirocin  ointment (BACTROBAN ) 2 % Apply 1 Application topically 2 (two) times daily. 30 g 0   rosuvastatin  (CRESTOR ) 40 MG tablet TAKE 1 TABLET BY MOUTH EVERY DAY 90 tablet 3   glimepiride  (AMARYL ) 2 MG tablet Take 1 tablet (2 mg total) by mouth daily before breakfast. (Patient not taking: Reported on 09/12/2023) 30  tablet 5   No current facility-administered medications on file prior to visit.     Review of Systems  Constitutional:  Negative for fever.  Respiratory:  Negative for cough, shortness of breath and wheezing.   Cardiovascular:  Negative for chest pain, palpitations and leg swelling.  Neurological:  Negative for light-headedness, numbness and headaches.       Objective:   Vitals:   09/12/23 0817  BP: 122/78  Pulse: 96  Temp: 98.2 F (36.8 C)  SpO2: 98%   BP Readings from Last 3 Encounters:  09/12/23 122/78  06/15/23 110/78  06/13/23 114/82   Wt Readings from Last 3 Encounters:  09/12/23 116 lb (52.6 kg)  09/04/23 113 lb (51.3 kg)  06/15/23 114 lb (51.7 kg)   Body mass index is 21.22 kg/m.    Physical Exam Constitutional:      General: She is not in acute distress.    Appearance: Normal appearance.  HENT:     Head: Normocephalic and atraumatic.  Eyes:     Conjunctiva/sclera: Conjunctivae normal.  Cardiovascular:     Rate and Rhythm: Normal rate and regular rhythm.     Heart sounds: Normal heart sounds.  Pulmonary:     Effort: Pulmonary effort is  normal. No respiratory distress.     Breath sounds: Normal breath sounds. No wheezing.  Musculoskeletal:     Cervical back: Neck supple.     Right lower leg: No edema.     Left lower leg: No edema.  Lymphadenopathy:     Cervical: No cervical adenopathy.  Skin:    General: Skin is warm and dry.     Findings: No rash.  Neurological:     Mental Status: She is alert. Mental status is at baseline.  Psychiatric:        Mood and Affect: Mood normal.        Behavior: Behavior normal.        Lab Results  Component Value Date   WBC 11.7 (H) 05/02/2023   HGB 11.1 (L) 05/02/2023   HCT 33.6 (L) 05/02/2023   PLT 276.0 05/02/2023   GLUCOSE 234 (H) 05/29/2023   CHOL 125 05/29/2023   TRIG 182.0 (H) 05/29/2023   HDL 30.00 (L) 05/29/2023   LDLDIRECT 53.0 11/10/2020   LDLCALC 59 05/29/2023   ALT 9 05/29/2023   AST  15 05/29/2023   NA 141 05/29/2023   K 3.9 05/29/2023   CL 110 05/29/2023   CREATININE 1.22 (H) 05/29/2023   BUN 45 (H) 05/29/2023   CO2 20 05/29/2023   TSH 0.657 04/28/2023   INR 1.38 12/01/2013   HGBA1C 9.1 (A) 09/12/2023   HGBA1C 9.1 09/12/2023   HGBA1C 9.1 (A) 09/12/2023   HGBA1C 9.1 (A) 09/12/2023   MICROALBUR 5.0 (H) 05/02/2023     Assessment & Plan:    See Problem List for Assessment and Plan of chronic medical problems.

## 2023-09-11 NOTE — Patient Instructions (Addendum)
      Blood work was ordered.       Medications changes include :   start jardiance 10 mg daily, continue glimepiride  4 mg daily      Return in about 3 months (around 12/12/2023) for follow up.

## 2023-09-12 ENCOUNTER — Ambulatory Visit (INDEPENDENT_AMBULATORY_CARE_PROVIDER_SITE_OTHER): Payer: Medicare Other | Admitting: Internal Medicine

## 2023-09-12 ENCOUNTER — Encounter: Payer: Self-pay | Admitting: Internal Medicine

## 2023-09-12 VITALS — BP 122/78 | HR 96 | Temp 98.2°F | Ht 62.0 in | Wt 116.0 lb

## 2023-09-12 DIAGNOSIS — I700 Atherosclerosis of aorta: Secondary | ICD-10-CM

## 2023-09-12 DIAGNOSIS — N1832 Chronic kidney disease, stage 3b: Secondary | ICD-10-CM

## 2023-09-12 DIAGNOSIS — E1122 Type 2 diabetes mellitus with diabetic chronic kidney disease: Secondary | ICD-10-CM

## 2023-09-12 DIAGNOSIS — Z7984 Long term (current) use of oral hypoglycemic drugs: Secondary | ICD-10-CM

## 2023-09-12 DIAGNOSIS — K219 Gastro-esophageal reflux disease without esophagitis: Secondary | ICD-10-CM

## 2023-09-12 DIAGNOSIS — I10 Essential (primary) hypertension: Secondary | ICD-10-CM | POA: Diagnosis not present

## 2023-09-12 DIAGNOSIS — I25119 Atherosclerotic heart disease of native coronary artery with unspecified angina pectoris: Secondary | ICD-10-CM

## 2023-09-12 DIAGNOSIS — E782 Mixed hyperlipidemia: Secondary | ICD-10-CM

## 2023-09-12 DIAGNOSIS — E559 Vitamin D deficiency, unspecified: Secondary | ICD-10-CM | POA: Diagnosis not present

## 2023-09-12 DIAGNOSIS — E038 Other specified hypothyroidism: Secondary | ICD-10-CM

## 2023-09-12 LAB — POCT GLYCOSYLATED HEMOGLOBIN (HGB A1C)
HbA1c POC (<> result, manual entry): 9.1 % (ref 4.0–5.6)
HbA1c, POC (controlled diabetic range): 9.1 % — AB (ref 0.0–7.0)
HbA1c, POC (prediabetic range): 9.1 % — AB (ref 5.7–6.4)
Hemoglobin A1C: 9.1 % — AB (ref 4.0–5.6)

## 2023-09-12 LAB — COMPREHENSIVE METABOLIC PANEL WITH GFR
ALT: 10 U/L (ref 0–35)
AST: 14 U/L (ref 0–37)
Albumin: 4.3 g/dL (ref 3.5–5.2)
Alkaline Phosphatase: 100 U/L (ref 39–117)
BUN: 24 mg/dL — ABNORMAL HIGH (ref 6–23)
CO2: 21 meq/L (ref 19–32)
Calcium: 9.9 mg/dL (ref 8.4–10.5)
Chloride: 109 meq/L (ref 96–112)
Creatinine, Ser: 1.68 mg/dL — ABNORMAL HIGH (ref 0.40–1.20)
GFR: 29.39 mL/min — ABNORMAL LOW (ref >60.00)
Glucose, Bld: 262 mg/dL — ABNORMAL HIGH (ref 70–99)
Potassium: 4.3 meq/L (ref 3.5–5.1)
Sodium: 140 meq/L (ref 135–145)
Total Bilirubin: 0.6 mg/dL (ref 0.2–1.2)
Total Protein: 7.4 g/dL (ref 6.0–8.3)

## 2023-09-12 LAB — CBC WITH DIFFERENTIAL/PLATELET
Basophils Absolute: 0.1 10*3/uL (ref 0.0–0.1)
Basophils Relative: 0.6 % (ref 0.0–3.0)
Eosinophils Absolute: 0.4 10*3/uL (ref 0.0–0.7)
Eosinophils Relative: 2.8 % (ref 0.0–5.0)
HCT: 35.5 % — ABNORMAL LOW (ref 36.0–46.0)
Hemoglobin: 12 g/dL (ref 12.0–15.0)
Lymphocytes Relative: 27.5 % (ref 12.0–46.0)
Lymphs Abs: 3.6 10*3/uL (ref 0.7–4.0)
MCHC: 33.7 g/dL (ref 30.0–36.0)
MCV: 84.7 fl (ref 78.0–100.0)
Monocytes Absolute: 0.6 10*3/uL (ref 0.1–1.0)
Monocytes Relative: 4.8 % (ref 3.0–12.0)
Neutro Abs: 8.4 10*3/uL — ABNORMAL HIGH (ref 1.4–7.7)
Neutrophils Relative %: 64.3 % (ref 43.0–77.0)
Platelets: 301 10*3/uL (ref 150.0–400.0)
RBC: 4.19 Mil/uL (ref 3.87–5.11)
RDW: 16.9 % — ABNORMAL HIGH (ref 11.5–15.5)
WBC: 13 10*3/uL — ABNORMAL HIGH (ref 4.0–10.5)

## 2023-09-12 LAB — VITAMIN D 25 HYDROXY (VIT D DEFICIENCY, FRACTURES): VITD: 47.78 ng/mL (ref 30.00–100.00)

## 2023-09-12 LAB — LIPID PANEL
Cholesterol: 124 mg/dL (ref 0–200)
HDL: 39 mg/dL — ABNORMAL LOW (ref >39.00)
LDL Cholesterol: 61 mg/dL (ref 0–99)
NonHDL: 85.47
Total CHOL/HDL Ratio: 3
Triglycerides: 121 mg/dL (ref 0.0–149.0)
VLDL: 24.2 mg/dL (ref 0.0–40.0)

## 2023-09-12 MED ORDER — EMPAGLIFLOZIN 10 MG PO TABS: 10.0000 mg | ORAL_TABLET | Freq: Every day | ORAL | 5 refills | Status: DC

## 2023-09-12 NOTE — Assessment & Plan Note (Signed)
 Chronic Blood pressure well controlled CBC, CMP Continue Coreg  3.125 mg twice daily

## 2023-09-12 NOTE — Assessment & Plan Note (Signed)
Chronic Continue Crestor 40 mg daily 

## 2023-09-12 NOTE — Assessment & Plan Note (Signed)
 Chronic Controlled Continue Pepcid 20 mg twice daily

## 2023-09-12 NOTE — Assessment & Plan Note (Addendum)
 Chronic Kidney function has improved Encouraged increased fluids Stressed importance of keeping sugars controlled Start jardiance 10 mg daily CMP, vitamin D  level

## 2023-09-12 NOTE — Assessment & Plan Note (Signed)
 Chronic Check CMP, lipid panel Continue rosuvastatin 40 mg daily

## 2023-09-12 NOTE — Assessment & Plan Note (Addendum)
 Chronic Lab Results  Component Value Date   HGBA1C 7.6 (H) 05/29/2023   Not controlled I have increased glimepiride  to 2 mg at her last visit, but she is taking 4mg  which she was on prior - will continue A1c today 9.1% Stressed diabetic diet Start jardiance 10 mg daily Monitor sugars closely at home - discussed risk of low sugars Deferred nutrition referral

## 2023-09-12 NOTE — Assessment & Plan Note (Signed)
Chronic Check vitamin D level 

## 2023-09-12 NOTE — Assessment & Plan Note (Signed)
 Chronic Denies any symptoms consistent with angina Continue aspirin  81 mg daily, carvedilol  3.125 mg twice daily, Crestor  40 mg daily

## 2023-09-12 NOTE — Addendum Note (Signed)
 Addended by: Colene Dauphin on: 09/12/2023 03:51 PM   Modules accepted: Orders

## 2023-09-17 ENCOUNTER — Other Ambulatory Visit

## 2023-09-20 ENCOUNTER — Other Ambulatory Visit

## 2023-09-21 ENCOUNTER — Other Ambulatory Visit

## 2023-09-23 ENCOUNTER — Other Ambulatory Visit: Payer: Self-pay | Admitting: Internal Medicine

## 2023-09-25 ENCOUNTER — Other Ambulatory Visit

## 2023-09-27 ENCOUNTER — Ambulatory Visit: Payer: Self-pay | Admitting: Internal Medicine

## 2023-09-27 ENCOUNTER — Other Ambulatory Visit

## 2023-09-27 DIAGNOSIS — I10 Essential (primary) hypertension: Secondary | ICD-10-CM | POA: Diagnosis not present

## 2023-09-27 DIAGNOSIS — N1832 Chronic kidney disease, stage 3b: Secondary | ICD-10-CM

## 2023-09-27 DIAGNOSIS — E119 Type 2 diabetes mellitus without complications: Secondary | ICD-10-CM

## 2023-09-27 DIAGNOSIS — E785 Hyperlipidemia, unspecified: Secondary | ICD-10-CM

## 2023-09-27 LAB — CBC WITH DIFFERENTIAL/PLATELET
Basophils Absolute: 0 10*3/uL (ref 0.0–0.1)
Basophils Relative: 0.6 % (ref 0.0–3.0)
Eosinophils Absolute: 0.3 10*3/uL (ref 0.0–0.7)
Eosinophils Relative: 3.4 % (ref 0.0–5.0)
HCT: 34 % — ABNORMAL LOW (ref 36.0–46.0)
Hemoglobin: 11.5 g/dL — ABNORMAL LOW (ref 12.0–15.0)
Lymphocytes Relative: 35.2 % (ref 12.0–46.0)
Lymphs Abs: 2.7 10*3/uL (ref 0.7–4.0)
MCHC: 33.9 g/dL (ref 30.0–36.0)
MCV: 84.3 fl (ref 78.0–100.0)
Monocytes Absolute: 0.4 10*3/uL (ref 0.1–1.0)
Monocytes Relative: 5.3 % (ref 3.0–12.0)
Neutro Abs: 4.3 10*3/uL (ref 1.4–7.7)
Neutrophils Relative %: 55.5 % (ref 43.0–77.0)
Platelets: 226 10*3/uL (ref 150.0–400.0)
RBC: 4.04 Mil/uL (ref 3.87–5.11)
RDW: 16.9 % — ABNORMAL HIGH (ref 11.5–15.5)
WBC: 7.7 10*3/uL (ref 4.0–10.5)

## 2023-09-27 LAB — COMPREHENSIVE METABOLIC PANEL WITH GFR
ALT: 14 U/L (ref 0–35)
AST: 19 U/L (ref 0–37)
Albumin: 4.3 g/dL (ref 3.5–5.2)
Alkaline Phosphatase: 84 U/L (ref 39–117)
BUN: 27 mg/dL — ABNORMAL HIGH (ref 6–23)
CO2: 21 meq/L (ref 19–32)
Calcium: 9.4 mg/dL (ref 8.4–10.5)
Chloride: 109 meq/L (ref 96–112)
Creatinine, Ser: 1.28 mg/dL — ABNORMAL HIGH (ref 0.40–1.20)
GFR: 40.72 mL/min — ABNORMAL LOW (ref >60.00)
Glucose, Bld: 127 mg/dL — ABNORMAL HIGH (ref 70–99)
Potassium: 4.1 meq/L (ref 3.5–5.1)
Sodium: 141 meq/L (ref 135–145)
Total Bilirubin: 0.5 mg/dL (ref 0.2–1.2)
Total Protein: 6.7 g/dL (ref 6.0–8.3)

## 2023-09-27 LAB — LIPID PANEL
Cholesterol: 140 mg/dL (ref 0–200)
HDL: 42.1 mg/dL (ref >39.00)
LDL Cholesterol: 77 mg/dL (ref 0–99)
NonHDL: 98.39
Total CHOL/HDL Ratio: 3
Triglycerides: 106 mg/dL (ref 0.0–149.0)
VLDL: 21.2 mg/dL (ref 0.0–40.0)

## 2023-09-27 LAB — BASIC METABOLIC PANEL WITH GFR
BUN: 27 mg/dL — ABNORMAL HIGH (ref 6–23)
CO2: 21 meq/L (ref 19–32)
Calcium: 9.4 mg/dL (ref 8.4–10.5)
Chloride: 109 meq/L (ref 96–112)
Creatinine, Ser: 1.28 mg/dL — ABNORMAL HIGH (ref 0.40–1.20)
GFR: 40.72 mL/min — ABNORMAL LOW (ref >60.00)
Glucose, Bld: 127 mg/dL — ABNORMAL HIGH (ref 70–99)
Potassium: 4.1 meq/L (ref 3.5–5.1)
Sodium: 141 meq/L (ref 135–145)

## 2023-09-27 LAB — HEMOGLOBIN A1C: Hgb A1c MFr Bld: 8.5 % — ABNORMAL HIGH (ref 4.6–6.5)

## 2023-10-02 MED ORDER — EMPAGLIFLOZIN 25 MG PO TABS: 25.0000 mg | ORAL_TABLET | Freq: Every day | ORAL | 5 refills | Status: AC

## 2023-10-02 MED ORDER — DEXCOM G7 SENSOR MISC: 5 refills | Status: DC

## 2023-10-02 MED ORDER — DEXCOM G7 RECEIVER DEVI: 0 refills | Status: DC

## 2023-11-02 ENCOUNTER — Other Ambulatory Visit: Payer: Self-pay | Admitting: Internal Medicine

## 2023-11-12 ENCOUNTER — Other Ambulatory Visit: Payer: Self-pay | Admitting: Internal Medicine

## 2023-11-12 MED ORDER — FAMOTIDINE 20 MG PO TABS: 20.0000 mg | ORAL_TABLET | Freq: Two times a day (BID) | ORAL | 1 refills | Status: AC

## 2023-11-12 NOTE — Telephone Encounter (Signed)
 Copied from CRM [id number]. Topic: Clinical - Medication Refill >> Nov 12, 2023 11:19 AM [place] A wrote: Medication: famotidine  (PEPCID ) 20 MG tablet; Sertraline  50 MG  Has the patient contacted their pharmacy? Yes (Agent: If no, request that the patient contact the pharmacy for the refill. If patient does not wish to contact the pharmacy document the reason why and proceed with request.) (Agent: If yes, when and what did the pharmacy advise?) Needs approval   This is the patient's preferred pharmacy:  [id number] [place] [address], [place] - [address] AT [facility] OF [facility] & [address] [place] [address] Phone: [phone] Fax: [phone]  Is this the correct pharmacy for this prescription? Yes If no, delete pharmacy and type the correct one.   Has the prescription been filled recently? No  Is the patient out of the medication? Yes  Has the patient been seen for an appointment in the last year OR does the patient have an upcoming appointment? Yes  Can we respond through MyChart? Yes  Agent: Please be advised that Rx refills may take up to 3 business days. We ask that you follow-up with your pharmacy.

## 2023-11-19 ENCOUNTER — Encounter: Payer: Self-pay | Admitting: Internal Medicine

## 2023-11-22 ENCOUNTER — Telehealth: Payer: Self-pay | Admitting: Internal Medicine

## 2023-11-22 ENCOUNTER — Other Ambulatory Visit: Payer: Self-pay | Admitting: Internal Medicine

## 2023-11-22 DIAGNOSIS — E119 Type 2 diabetes mellitus without complications: Secondary | ICD-10-CM

## 2023-11-22 NOTE — Telephone Encounter (Signed)
 Copied from CRM [id number]. Topic: Clinical - Medication Question >> Nov 22, 2023  3:36 PM Lavanda D wrote: Reason for CRM: Patient just started taking Setraline 50mg  again about 1 month ago, not currently on med list but she says it is prescribed by Dr. Geofm. She is scheduled July 28th and is wondering if it can be refilled for a supply between now and 28th.

## 2023-11-26 MED ORDER — SERTRALINE HCL 50 MG PO TABS: 50.0000 mg | ORAL_TABLET | Freq: Every day | ORAL | 0 refills | Status: DC

## 2023-11-26 NOTE — Telephone Encounter (Signed)
 Sent in #30 no refills and last prescribed 2023 by Burns.

## 2023-12-10 ENCOUNTER — Ambulatory Visit (INDEPENDENT_AMBULATORY_CARE_PROVIDER_SITE_OTHER): Admitting: Internal Medicine

## 2023-12-10 ENCOUNTER — Encounter: Payer: Self-pay | Admitting: Internal Medicine

## 2023-12-10 VITALS — BP 120/78 | HR 79 | Temp 98.4°F | Ht 62.0 in | Wt 119.0 lb

## 2023-12-10 DIAGNOSIS — F32A Depression, unspecified: Secondary | ICD-10-CM

## 2023-12-10 DIAGNOSIS — F419 Anxiety disorder, unspecified: Secondary | ICD-10-CM | POA: Diagnosis not present

## 2023-12-10 DIAGNOSIS — K219 Gastro-esophageal reflux disease without esophagitis: Secondary | ICD-10-CM

## 2023-12-10 DIAGNOSIS — Z7984 Long term (current) use of oral hypoglycemic drugs: Secondary | ICD-10-CM | POA: Diagnosis not present

## 2023-12-10 DIAGNOSIS — E2839 Other primary ovarian failure: Secondary | ICD-10-CM | POA: Diagnosis not present

## 2023-12-10 DIAGNOSIS — I10 Essential (primary) hypertension: Secondary | ICD-10-CM

## 2023-12-10 DIAGNOSIS — E1122 Type 2 diabetes mellitus with diabetic chronic kidney disease: Secondary | ICD-10-CM | POA: Diagnosis not present

## 2023-12-10 DIAGNOSIS — I25119 Atherosclerotic heart disease of native coronary artery with unspecified angina pectoris: Secondary | ICD-10-CM | POA: Diagnosis not present

## 2023-12-10 DIAGNOSIS — N1832 Chronic kidney disease, stage 3b: Secondary | ICD-10-CM

## 2023-12-10 DIAGNOSIS — E782 Mixed hyperlipidemia: Secondary | ICD-10-CM | POA: Diagnosis not present

## 2023-12-10 LAB — HEMOGLOBIN A1C: Hgb A1c MFr Bld: 9.2 % — ABNORMAL HIGH (ref 4.6–6.5)

## 2023-12-10 LAB — CBC WITH DIFFERENTIAL/PLATELET
Basophils Absolute: 0 K/uL (ref 0.0–0.1)
Basophils Relative: 0.5 % (ref 0.0–3.0)
Eosinophils Absolute: 0.3 K/uL (ref 0.0–0.7)
Eosinophils Relative: 3.1 % (ref 0.0–5.0)
HCT: 37.6 % (ref 36.0–46.0)
Hemoglobin: 12.4 g/dL (ref 12.0–15.0)
Lymphocytes Relative: 28.1 % (ref 12.0–46.0)
Lymphs Abs: 2.3 K/uL (ref 0.7–4.0)
MCHC: 32.8 g/dL (ref 30.0–36.0)
MCV: 85.3 fl (ref 78.0–100.0)
Monocytes Absolute: 0.5 K/uL (ref 0.1–1.0)
Monocytes Relative: 5.6 % (ref 3.0–12.0)
Neutro Abs: 5.2 K/uL (ref 1.4–7.7)
Neutrophils Relative %: 62.7 % (ref 43.0–77.0)
Platelets: 261 K/uL (ref 150.0–400.0)
RBC: 4.41 Mil/uL (ref 3.87–5.11)
RDW: 15.3 % (ref 11.5–15.5)
WBC: 8.3 K/uL (ref 4.0–10.5)

## 2023-12-10 LAB — COMPREHENSIVE METABOLIC PANEL WITH GFR
ALT: 13 U/L (ref 0–35)
AST: 19 U/L (ref 0–37)
Albumin: 4.5 g/dL (ref 3.5–5.2)
Alkaline Phosphatase: 85 U/L (ref 39–117)
BUN: 39 mg/dL — ABNORMAL HIGH (ref 6–23)
CO2: 21 meq/L (ref 19–32)
Calcium: 10.2 mg/dL (ref 8.4–10.5)
Chloride: 109 meq/L (ref 96–112)
Creatinine, Ser: 1.19 mg/dL (ref 0.40–1.20)
GFR: 44.38 mL/min — ABNORMAL LOW (ref >60.00)
Glucose, Bld: 123 mg/dL — ABNORMAL HIGH (ref 70–99)
Potassium: 4.5 meq/L (ref 3.5–5.1)
Sodium: 140 meq/L (ref 135–145)
Total Bilirubin: 0.6 mg/dL (ref 0.2–1.2)
Total Protein: 7.5 g/dL (ref 6.0–8.3)

## 2023-12-10 LAB — MICROALBUMIN / CREATININE URINE RATIO
Creatinine,U: 51.2 mg/dL
Microalb Creat Ratio: 22.5 mg/g (ref 0.0–30.0)
Microalb, Ur: 1.2 mg/dL (ref 0.0–1.9)

## 2023-12-10 NOTE — Assessment & Plan Note (Signed)
 Chronic Check CMP, lipid panel Continue rosuvastatin 40 mg daily

## 2023-12-10 NOTE — Assessment & Plan Note (Signed)
 Chronic Denies any symptoms consistent with angina Continue aspirin  81 mg daily, carvedilol  3.125 mg twice daily, Crestor  40 mg daily

## 2023-12-10 NOTE — Assessment & Plan Note (Signed)
 Chronic Lab Results  Component Value Date   HGBA1C 9.2 (H) 12/10/2023   Not controlled  Stressed diabetic diet Continue  jardiance  25 mg daily, glimepiride  4 mg daily

## 2023-12-10 NOTE — Assessment & Plan Note (Signed)
 Chronic Has many symptoms of depression and anxiety continue sertraline  50 mg daily

## 2023-12-10 NOTE — Assessment & Plan Note (Signed)
Chronic Blood pressure well controlled CMP Continue Coreg 3.125 mg twice daily

## 2023-12-10 NOTE — Assessment & Plan Note (Signed)
 Chronic Kidney function has improved Encouraged increased fluids Stressed importance of keeping sugars controlled Continue jardiance  25 mg daily cmp

## 2023-12-10 NOTE — Assessment & Plan Note (Signed)
 Chronic Controlled Continue Pepcid 20 mg twice daily

## 2023-12-10 NOTE — Patient Instructions (Addendum)
      Blood work was ordered.       Medications changes include :   None     Return in about 3 months (around 03/11/2024) for follow up.

## 2023-12-10 NOTE — Progress Notes (Signed)
 Subjective:    Patient ID: Kathryn Logan, female    DOB: Aug 23, 1945, 78 y.o.   MRN: [id number]     HPI Kathryn Logan is here for follow up of her chronic medical problems.  Sugars at home - 182, 242, 292, 180, 124, 130 in morning.  She stopped drinking lemonade.  She is only drinking water.  She stopped eating bread.    Has eye appt 8/28  Medications and allergies reviewed with patient and updated if appropriate.  Current Outpatient Medications on File Prior to Visit  Medication Sig Dispense Refill   ACCU-CHEK GUIDE TEST test strip USE AS DIRECTED TWICE DAILY 200 strip 1   Accu-Chek Softclix Lancets lancets 2 (two) times daily.     aspirin  EC 81 MG tablet Take 81 mg by mouth daily.      Biotin 5 MG TABS Take by mouth.     Blood Glucose Monitoring Suppl (ACCU-CHEK GUIDE ME) w/Device KIT      Blood Glucose Monitoring Suppl DEVI 1 each by Does not apply route as directed. May substitute to any manufacturer covered by patient's insurance. 1 kit 0   carvedilol  (COREG ) 3.125 MG tablet Take 1 tablet (3.125 mg total) by mouth 2 (two) times daily with a meal. 180 tablet 3   Continuous Glucose Receiver (DEXCOM G7 RECEIVER) DEVI UAD to monitor sugars   E11.9 1 each 0   Continuous Glucose Sensor (DEXCOM G7 SENSOR) MISC UAD to monitor sugars.   E11.9 3 each 5   empagliflozin  (JARDIANCE ) 25 MG TABS tablet Take 1 tablet (25 mg total) by mouth daily before breakfast. 30 tablet 5   famotidine  (PEPCID ) 20 MG tablet Take 1 tablet (20 mg total) by mouth 2 (two) times daily. 180 tablet 1   glimepiride  (AMARYL ) 4 MG tablet Take 4 mg by mouth daily with breakfast.     loratadine  (CLARITIN ) 10 MG tablet Take 1 tablet (10 mg total) by mouth daily. 90 tablet 3   mupirocin  ointment (BACTROBAN ) 2 % Apply 1 Application topically 2 (two) times daily. 30 g 0   rosuvastatin  (CRESTOR ) 40 MG tablet TAKE 1 TABLET BY MOUTH EVERY DAY 90 tablet 3   sertraline  (ZOLOFT ) 50 MG tablet Take 1 tablet (50 mg total) by  mouth daily. 30 tablet 0   No current facility-administered medications on file prior to visit.     Review of Systems  Constitutional:  Negative for fever.  Respiratory:  Negative for cough, shortness of breath and wheezing.   Cardiovascular:  Negative for chest pain, palpitations and leg swelling.  Neurological:  Negative for light-headedness and headaches.       Objective:   Vitals:   12/10/23 1403  BP: 120/78  Pulse: 79  Temp: 98.4 F (36.9 C)  SpO2: 98%   BP Readings from Last 3 Encounters:  12/10/23 120/78  09/12/23 122/78  06/15/23 110/78   Wt Readings from Last 3 Encounters:  12/10/23 119 lb (54 kg)  09/12/23 116 lb (52.6 kg)  09/04/23 113 lb (51.3 kg)   Body mass index is 21.77 kg/m.    Physical Exam Constitutional:      General: She is not in acute distress.    Appearance: Normal appearance.  HENT:     Head: Normocephalic and atraumatic.  Eyes:     Conjunctiva/sclera: Conjunctivae normal.  Cardiovascular:     Rate and Rhythm: Normal rate and regular rhythm.     Heart sounds: Normal heart sounds.  Pulmonary:  Effort: Pulmonary effort is normal. No respiratory distress.     Breath sounds: Normal breath sounds. No wheezing.  Musculoskeletal:     Cervical back: Neck supple.     Right lower leg: No edema.     Left lower leg: No edema.  Lymphadenopathy:     Cervical: No cervical adenopathy.  Skin:    General: Skin is warm and dry.     Findings: No rash.  Neurological:     Mental Status: She is alert. Mental status is at baseline.  Psychiatric:        Mood and Affect: Mood normal.        Behavior: Behavior normal.         Diabetic Foot Exam - Simple   Simple Foot Form Diabetic Foot exam was performed with the following findings: Yes 12/10/2023  2:54 PM  Visual Inspection No deformities, no ulcerations, no other skin breakdown bilaterally: Yes Sensation Testing Intact to touch and monofilament testing bilaterally: Yes Pulse  Check Posterior Tibialis and Dorsalis pulse intact bilaterally: Yes Comments      Lab Results  Component Value Date   WBC 13.0 (H) 09/12/2023   HGB 12.0 09/12/2023   HCT 35.5 (L) 09/12/2023   PLT 301.0 09/12/2023   GLUCOSE 127 (H) 09/27/2023   CHOL 124 09/12/2023   TRIG 121.0 09/12/2023   HDL 39.00 (L) 09/12/2023   LDLDIRECT 53.0 11/10/2020   LDLCALC 61 09/12/2023   ALT 10 09/12/2023   AST 14 09/12/2023   NA 141 09/27/2023   K 4.1 09/27/2023   CL 109 09/27/2023   CREATININE 1.28 (H) 09/27/2023   BUN 27 (H) 09/27/2023   CO2 21 09/27/2023   TSH 0.657 04/28/2023   INR 1.38 12/01/2013   HGBA1C 9.1 (A) 09/12/2023   HGBA1C 9.1 09/12/2023   HGBA1C 9.1 (A) 09/12/2023   HGBA1C 9.1 (A) 09/12/2023     Assessment & Plan:    See Problem List for Assessment and Plan of chronic medical problems.

## 2023-12-12 ENCOUNTER — Ambulatory Visit: Admitting: Internal Medicine

## 2023-12-14 ENCOUNTER — Ambulatory Visit: Payer: Self-pay | Admitting: Internal Medicine

## 2023-12-14 ENCOUNTER — Telehealth: Payer: Self-pay | Admitting: Internal Medicine

## 2023-12-14 MED ORDER — METFORMIN HCL ER 500 MG PO TB24: 500.0000 mg | ORAL_TABLET | Freq: Every day | ORAL | 1 refills | Status: DC

## 2023-12-14 NOTE — Telephone Encounter (Signed)
 Copied from CRM [id number]. Topic: Clinical - Medication Question >> Dec 14, 2023 10:09 AM Jasmin G wrote: Reason for CRM: Pt was recently prescribed  metFORMIN  (GLUCOPHAGE -XR) 500 MG 24 hr tablet and pt wanted to know if it's okay for her to take it since she's already taking glimepiride  (AMARYL ) 4 MG tablet, please call pt back ASAP to discuss, she stated that she would like to speak to Mrs. Tobias Sharps (CMA) if possible

## 2023-12-14 NOTE — Telephone Encounter (Signed)
 I just put metformin  on the list today.  She was not taking it as far as I now-we discontinued it when your kidney function got worse.

## 2023-12-14 NOTE — Telephone Encounter (Signed)
 Pt called back and stated that she hasn't been taking the Jardiance . As I was relaying the message that she should be taking all 3, the call disconnected.

## 2023-12-21 DIAGNOSIS — H0011 Chalazion right upper eyelid: Secondary | ICD-10-CM | POA: Diagnosis not present

## 2023-12-21 DIAGNOSIS — H2513 Age-related nuclear cataract, bilateral: Secondary | ICD-10-CM | POA: Diagnosis not present

## 2023-12-21 DIAGNOSIS — E119 Type 2 diabetes mellitus without complications: Secondary | ICD-10-CM | POA: Diagnosis not present

## 2023-12-21 DIAGNOSIS — H5213 Myopia, bilateral: Secondary | ICD-10-CM | POA: Diagnosis not present

## 2023-12-21 LAB — HM DIABETES EYE EXAM

## 2023-12-22 ENCOUNTER — Other Ambulatory Visit: Payer: Self-pay | Admitting: Internal Medicine

## 2023-12-25 ENCOUNTER — Inpatient Hospital Stay: Admission: RE | Admit: 2023-12-25 | Source: Ambulatory Visit

## 2023-12-27 ENCOUNTER — Ambulatory Visit: Payer: Self-pay

## 2023-12-27 NOTE — Telephone Encounter (Signed)
  FYI Only or Action Required?: FYI only for provider.  Patient was last seen in primary care on 12/10/2023 by Geofm Glade PARAS, MD.  Called Nurse Triage reporting Blood Sugar Problem.  Symptoms began today.  Interventions attempted: Prescription medications: dm medications.  Symptoms are: unchanged.  Triage Disposition: No disposition on file.  Patient/caregiver understands and will follow disposition?:     Copied from CRM [id number]. Topic: Clinical - Red Word Triage >> Dec 27, 2023  3:47 PM Robinson H wrote: [facility] that prompted transfer to Nurse Triage: High blood sugar this morning was 267 now after lunch it's 412, states she feels fine. Reason for Disposition  [1] Blood glucose > 300 mg/dL (83.2 mmol/L) AND [7] uses insulin  (e.g., insulin -dependent, all people with type 1 diabetes)  Answer Assessment - Initial Assessment Questions 1. BLOOD GLUCOSE: What is your blood glucose level?      267 this am and now 412 2. ONSET: When did you check the blood glucose?     This am 3. USUAL RANGE: What is your glucose level usually? (e.g., usual fasting morning value, usual evening value)   124 - 292 4. KETONES: Do you check for ketones (urine or blood test strips)? If Yes, ask: What does the test show now?      na 5. TYPE 1 or 2:  Do you know what type of diabetes you have?  (e.g., Type 1, Type 2, Gestational; doesn't know)      Type 2 6. INSULIN : Do you take insulin ? What type of insulin (s) do you use? What is the mode of delivery? (syringe, pen; injection or pump)?      no 7. DIABETES PILLS: Do you take any pills for your diabetes? If Yes, ask: Have you missed taking any pills recently?     Yes, denies missing doses 8. OTHER SYMPTOMS: Do you have any symptoms? (e.g., fever, frequent urination, difficulty breathing, dizziness, weakness, vomiting)     Frequent urination 9. PREGNANCY: Is there any chance you are pregnant? When was your last menstrual period?      na  Protocols used: Diabetes - High Blood Sugar-A-AH

## 2023-12-28 MED ORDER — METFORMIN HCL ER 500 MG PO TB24: 1000.0000 mg | ORAL_TABLET | Freq: Every day | ORAL | Status: DC

## 2023-12-28 NOTE — Telephone Encounter (Signed)
 Spoke with patient today and info given.

## 2023-12-28 NOTE — Telephone Encounter (Signed)
 Increase metformin  to 2 pills daily

## 2023-12-28 NOTE — Addendum Note (Signed)
 Addended by: GEOFM GLADE PARAS on: 12/28/2023 01:13 PM   Modules accepted: Orders

## 2024-01-02 ENCOUNTER — Inpatient Hospital Stay: Admission: RE | Admit: 2024-01-02 | Source: Ambulatory Visit

## 2024-01-03 ENCOUNTER — Ambulatory Visit: Admitting: Internal Medicine

## 2024-01-19 ENCOUNTER — Other Ambulatory Visit: Payer: Self-pay | Admitting: Internal Medicine

## 2024-01-20 NOTE — Patient Instructions (Incomplete)
      Blood work was ordered.       Medications changes include :   None    A referral was ordered and someone will call you to schedule an appointment.     No follow-ups on file.

## 2024-01-20 NOTE — Progress Notes (Deleted)
 Subjective:    Patient ID: Kathryn Logan, female    DOB: 17-Nov-1945, 78 y.o.   MRN: [id number]     HPI Kathryn Logan is here for follow up of her chronic medical problems.    Medications and allergies reviewed with patient and updated if appropriate.  Current Outpatient Medications on File Prior to Visit  Medication Sig Dispense Refill   ACCU-CHEK GUIDE TEST test strip USE AS DIRECTED TWICE DAILY 200 strip 1   Accu-Chek Softclix Lancets lancets 2 (two) times daily.     aspirin  EC 81 MG tablet Take 81 mg by mouth daily.      Biotin 5 MG TABS Take by mouth.     Blood Glucose Monitoring Suppl (ACCU-CHEK GUIDE ME) w/Device KIT      Blood Glucose Monitoring Suppl DEVI 1 each by Does not apply route as directed. May substitute to any manufacturer covered by patient's insurance. 1 kit 0   carvedilol  (COREG ) 3.125 MG tablet Take 1 tablet (3.125 mg total) by mouth 2 (two) times daily with a meal. 180 tablet 3   Continuous Glucose Receiver (DEXCOM G7 RECEIVER) DEVI UAD to monitor sugars   E11.9 1 each 0   Continuous Glucose Sensor (DEXCOM G7 SENSOR) MISC UAD to monitor sugars.   E11.9 3 each 5   empagliflozin  (JARDIANCE ) 25 MG TABS tablet Take 1 tablet (25 mg total) by mouth daily before breakfast. 30 tablet 5   famotidine  (PEPCID ) 20 MG tablet Take 1 tablet (20 mg total) by mouth 2 (two) times daily. 180 tablet 1   glimepiride  (AMARYL ) 4 MG tablet Take 4 mg by mouth daily with breakfast.     loratadine  (CLARITIN ) 10 MG tablet Take 1 tablet (10 mg total) by mouth daily. 90 tablet 3   metFORMIN  (GLUCOPHAGE -XR) 500 MG 24 hr tablet Take 2 tablets (1,000 mg total) by mouth daily with breakfast.     mupirocin  ointment (BACTROBAN ) 2 % Apply 1 Application topically 2 (two) times daily. 30 g 0   rosuvastatin  (CRESTOR ) 40 MG tablet TAKE 1 TABLET BY MOUTH EVERY DAY 90 tablet 3   sertraline  (ZOLOFT ) 50 MG tablet TAKE 1 TABLET(50 MG) BY MOUTH DAILY 30 tablet 0   No current facility-administered  medications on file prior to visit.     Review of Systems     Objective:  There were no vitals filed for this visit. BP Readings from Last 3 Encounters:  12/10/23 120/78  09/12/23 122/78  06/15/23 110/78   Wt Readings from Last 3 Encounters:  12/10/23 119 lb (54 kg)  09/12/23 116 lb (52.6 kg)  09/04/23 113 lb (51.3 kg)   There is no height or weight on file to calculate BMI.    Physical Exam     Lab Results  Component Value Date   WBC 8.3 12/10/2023   HGB 12.4 12/10/2023   HCT 37.6 12/10/2023   PLT 261.0 12/10/2023   GLUCOSE 123 (H) 12/10/2023   CHOL 124 09/12/2023   TRIG 121.0 09/12/2023   HDL 39.00 (L) 09/12/2023   LDLDIRECT 53.0 11/10/2020   LDLCALC 61 09/12/2023   ALT 13 12/10/2023   AST 19 12/10/2023   NA 140 12/10/2023   K 4.5 12/10/2023   CL 109 12/10/2023   CREATININE 1.19 12/10/2023   BUN 39 (H) 12/10/2023   CO2 21 12/10/2023   TSH 0.657 04/28/2023   INR 1.38 12/01/2013   HGBA1C 9.2 (H) 12/10/2023   MICROALBUR 1.2 12/10/2023  Assessment & Plan:    See Problem List for Assessment and Plan of chronic medical problems.

## 2024-01-22 ENCOUNTER — Ambulatory Visit: Admitting: Internal Medicine

## 2024-01-22 DIAGNOSIS — E1122 Type 2 diabetes mellitus with diabetic chronic kidney disease: Secondary | ICD-10-CM

## 2024-01-23 ENCOUNTER — Inpatient Hospital Stay: Admission: RE | Admit: 2024-01-23 | Source: Ambulatory Visit

## 2024-02-05 ENCOUNTER — Ambulatory Visit: Admitting: Internal Medicine

## 2024-02-06 ENCOUNTER — Inpatient Hospital Stay: Admission: RE | Admit: 2024-02-06 | Source: Ambulatory Visit

## 2024-02-21 ENCOUNTER — Other Ambulatory Visit: Payer: Self-pay | Admitting: Internal Medicine

## 2024-02-24 ENCOUNTER — Encounter: Payer: Self-pay | Admitting: Internal Medicine

## 2024-02-24 NOTE — Patient Instructions (Incomplete)
      Blood work was ordered.       Medications changes include :   None    A referral was ordered and someone will call you to schedule an appointment.     No follow-ups on file.

## 2024-02-24 NOTE — Progress Notes (Deleted)
 Subjective:    Patient ID: Kathryn Logan, female    DOB: 17-Nov-1945, 78 y.o.   MRN: [id number]     HPI Kathryn Logan is here for follow up of her chronic medical problems.    Medications and allergies reviewed with patient and updated if appropriate.  Current Outpatient Medications on File Prior to Visit  Medication Sig Dispense Refill   ACCU-CHEK GUIDE TEST test strip USE AS DIRECTED TWICE DAILY 200 strip 1   Accu-Chek Softclix Lancets lancets 2 (two) times daily.     aspirin  EC 81 MG tablet Take 81 mg by mouth daily.      Biotin 5 MG TABS Take by mouth.     Blood Glucose Monitoring Suppl (ACCU-CHEK GUIDE ME) w/Device KIT      Blood Glucose Monitoring Suppl DEVI 1 each by Does not apply route as directed. May substitute to any manufacturer covered by patient's insurance. 1 kit 0   carvedilol  (COREG ) 3.125 MG tablet Take 1 tablet (3.125 mg total) by mouth 2 (two) times daily with a meal. 180 tablet 3   Continuous Glucose Receiver (DEXCOM G7 RECEIVER) DEVI UAD to monitor sugars   E11.9 1 each 0   Continuous Glucose Sensor (DEXCOM G7 SENSOR) MISC UAD to monitor sugars.   E11.9 3 each 5   empagliflozin  (JARDIANCE ) 25 MG TABS tablet Take 1 tablet (25 mg total) by mouth daily before breakfast. 30 tablet 5   famotidine  (PEPCID ) 20 MG tablet Take 1 tablet (20 mg total) by mouth 2 (two) times daily. 180 tablet 1   glimepiride  (AMARYL ) 4 MG tablet Take 4 mg by mouth daily with breakfast.     loratadine  (CLARITIN ) 10 MG tablet Take 1 tablet (10 mg total) by mouth daily. 90 tablet 3   metFORMIN  (GLUCOPHAGE -XR) 500 MG 24 hr tablet Take 2 tablets (1,000 mg total) by mouth daily with breakfast.     mupirocin  ointment (BACTROBAN ) 2 % Apply 1 Application topically 2 (two) times daily. 30 g 0   rosuvastatin  (CRESTOR ) 40 MG tablet TAKE 1 TABLET BY MOUTH EVERY DAY 90 tablet 3   sertraline  (ZOLOFT ) 50 MG tablet TAKE 1 TABLET(50 MG) BY MOUTH DAILY 30 tablet 0   No current facility-administered  medications on file prior to visit.     Review of Systems     Objective:  There were no vitals filed for this visit. BP Readings from Last 3 Encounters:  12/10/23 120/78  09/12/23 122/78  06/15/23 110/78   Wt Readings from Last 3 Encounters:  12/10/23 119 lb (54 kg)  09/12/23 116 lb (52.6 kg)  09/04/23 113 lb (51.3 kg)   There is no height or weight on file to calculate BMI.    Physical Exam     Lab Results  Component Value Date   WBC 8.3 12/10/2023   HGB 12.4 12/10/2023   HCT 37.6 12/10/2023   PLT 261.0 12/10/2023   GLUCOSE 123 (H) 12/10/2023   CHOL 124 09/12/2023   TRIG 121.0 09/12/2023   HDL 39.00 (L) 09/12/2023   LDLDIRECT 53.0 11/10/2020   LDLCALC 61 09/12/2023   ALT 13 12/10/2023   AST 19 12/10/2023   NA 140 12/10/2023   K 4.5 12/10/2023   CL 109 12/10/2023   CREATININE 1.19 12/10/2023   BUN 39 (H) 12/10/2023   CO2 21 12/10/2023   TSH 0.657 04/28/2023   INR 1.38 12/01/2013   HGBA1C 9.2 (H) 12/10/2023   MICROALBUR 1.2 12/10/2023  Assessment & Plan:    See Problem List for Assessment and Plan of chronic medical problems.

## 2024-02-26 ENCOUNTER — Other Ambulatory Visit: Payer: Self-pay | Admitting: Internal Medicine

## 2024-02-26 ENCOUNTER — Ambulatory Visit: Admitting: Internal Medicine

## 2024-02-26 DIAGNOSIS — E1169 Type 2 diabetes mellitus with other specified complication: Secondary | ICD-10-CM

## 2024-02-26 DIAGNOSIS — I25119 Atherosclerotic heart disease of native coronary artery with unspecified angina pectoris: Secondary | ICD-10-CM

## 2024-02-26 DIAGNOSIS — F32A Depression, unspecified: Secondary | ICD-10-CM

## 2024-02-26 DIAGNOSIS — I152 Hypertension secondary to endocrine disorders: Secondary | ICD-10-CM

## 2024-02-26 DIAGNOSIS — N1832 Chronic kidney disease, stage 3b: Secondary | ICD-10-CM

## 2024-02-26 NOTE — Assessment & Plan Note (Deleted)
 Chronic Lab Results  Component Value Date   LDLCALC 61 09/12/2023    LDL well-controlled Continue rosuvastatin  40 mg daily

## 2024-02-26 NOTE — Assessment & Plan Note (Deleted)
Chronic Blood pressure well controlled CMP Continue Coreg 3.125 mg twice daily

## 2024-02-26 NOTE — Assessment & Plan Note (Deleted)
 Chronic Denies any symptoms consistent with angina Continue aspirin  81 mg daily, carvedilol  3.125 mg twice daily, Crestor  40 mg daily  Lab Results  Component Value Date   LDLCALC 61 09/12/2023

## 2024-02-26 NOTE — Assessment & Plan Note (Deleted)
 Chronic Associated with chronic kidney disease stage 3b  Lab Results  Component Value Date   HGBA1C 9.2 (H) 12/10/2023   Not controlled Check A1c today- Stressed diabetic diet Continue  jardiance  25 mg daily, glimepiride  4 mg daily, metformin  XR 1000 mg daily with breakfast

## 2024-02-26 NOTE — Assessment & Plan Note (Deleted)
 Chronic Has many symptoms of depression and anxiety continue sertraline  50 mg daily

## 2024-02-26 NOTE — Assessment & Plan Note (Deleted)
 Chronic Kidney function has improved Encouraged increased fluids Stressed importance of keeping sugars controlled Continue jardiance  25 mg daily cmp

## 2024-02-27 ENCOUNTER — Inpatient Hospital Stay: Admission: RE | Admit: 2024-02-27 | Source: Ambulatory Visit

## 2024-03-02 ENCOUNTER — Encounter: Payer: Self-pay | Admitting: Internal Medicine

## 2024-03-02 NOTE — Patient Instructions (Addendum)
      Blood work was ordered.       Medications changes include :   None    A referral was ordered and someone will call you to schedule an appointment.     Return for cancel 11/4 appt - follow up in .

## 2024-03-02 NOTE — Progress Notes (Signed)
      Subjective:    Patient ID: Kathryn Logan, female    DOB: 02/03/1946, 78 y.o.   MRN: [id number]     HPI Neola is here for follow up of her chronic medical problems.    Medications and allergies reviewed with patient and updated if appropriate.  Current Outpatient Medications on File Prior to Visit  Medication Sig Dispense Refill  . ACCU-CHEK GUIDE TEST test strip USE AS DIRECTED TWICE DAILY 200 strip 1  . Accu-Chek Softclix Lancets lancets 2 (two) times daily.    . aspirin  EC 81 MG tablet Take 81 mg by mouth daily.     . Biotin 5 MG TABS Take by mouth.    . Blood Glucose Monitoring Suppl (ACCU-CHEK GUIDE ME) w/Device KIT     . Blood Glucose Monitoring Suppl DEVI 1 each by Does not apply route as directed. May substitute to any manufacturer covered by patient's insurance. 1 kit 0  . carvedilol  (COREG ) 3.125 MG tablet Take 1 tablet (3.125 mg total) by mouth 2 (two) times daily with a meal. 180 tablet 3  . empagliflozin  (JARDIANCE ) 25 MG TABS tablet Take 1 tablet (25 mg total) by mouth daily before breakfast. (Patient not taking: Reported on 03/14/2024) 30 tablet 5  . famotidine  (PEPCID ) 20 MG tablet Take 1 tablet (20 mg total) by mouth 2 (two) times daily. 180 tablet 1  . glimepiride  (AMARYL ) 4 MG tablet TAKE 1 TABLET BY MOUTH EVERY MORNING BEFORE BREAKFAST (Patient not taking: Reported on 03/14/2024) 90 tablet 1  . loratadine  (CLARITIN ) 10 MG tablet Take 1 tablet (10 mg total) by mouth daily. 90 tablet 3  . rosuvastatin  (CRESTOR ) 40 MG tablet TAKE 1 TABLET BY MOUTH EVERY DAY 90 tablet 3   No current facility-administered medications on file prior to visit.     Review of Systems     Objective:  There were no vitals filed for this visit. BP Readings from Last 3 Encounters:  03/14/24 114/72  12/10/23 120/78  09/12/23 122/78   Wt Readings from Last 3 Encounters:  03/14/24 119 lb (54 kg)  12/10/23 119 lb (54 kg)  09/12/23 116 lb (52.6 kg)   There is no height or  weight on file to calculate BMI.    Physical Exam     Lab Results  Component Value Date   WBC 8.3 12/10/2023   HGB 12.4 12/10/2023   HCT 37.6 12/10/2023   PLT 261.0 12/10/2023   GLUCOSE 123 (H) 12/10/2023   CHOL 124 09/12/2023   TRIG 121.0 09/12/2023   HDL 39.00 (L) 09/12/2023   LDLDIRECT 53.0 11/10/2020   LDLCALC 61 09/12/2023   ALT 13 12/10/2023   AST 19 12/10/2023   NA 140 12/10/2023   K 4.5 12/10/2023   CL 109 12/10/2023   CREATININE 1.19 12/10/2023   BUN 39 (H) 12/10/2023   CO2 21 12/10/2023   TSH 0.657 04/28/2023   INR 1.38 12/01/2013   HGBA1C 9.2 (H) 12/10/2023   MICROALBUR 1.2 12/10/2023     Assessment & Plan:    See Problem List for Assessment and Plan of chronic medical problems.    This encounter was created in error - please disregard.

## 2024-03-04 ENCOUNTER — Encounter: Admitting: Internal Medicine

## 2024-03-04 DIAGNOSIS — F419 Anxiety disorder, unspecified: Secondary | ICD-10-CM

## 2024-03-04 DIAGNOSIS — E119 Type 2 diabetes mellitus without complications: Secondary | ICD-10-CM | POA: Insufficient documentation

## 2024-03-04 DIAGNOSIS — E1169 Type 2 diabetes mellitus with other specified complication: Secondary | ICD-10-CM

## 2024-03-04 DIAGNOSIS — K219 Gastro-esophageal reflux disease without esophagitis: Secondary | ICD-10-CM

## 2024-03-04 DIAGNOSIS — I495 Sick sinus syndrome: Secondary | ICD-10-CM | POA: Insufficient documentation

## 2024-03-04 DIAGNOSIS — E1159 Type 2 diabetes mellitus with other circulatory complications: Secondary | ICD-10-CM

## 2024-03-04 DIAGNOSIS — N1832 Chronic kidney disease, stage 3b: Secondary | ICD-10-CM

## 2024-03-04 NOTE — Assessment & Plan Note (Signed)
 Chronic Associated with diabetes Lab Results  Component Value Date   LDLCALC 61 09/12/2023    LDL well-controlled Continue rosuvastatin  40 mg daily

## 2024-03-04 NOTE — Assessment & Plan Note (Signed)
 Chronic Denies any symptoms consistent with angina Continue aspirin  81 mg daily, carvedilol  3.125 mg twice daily, Crestor  40 mg daily  Lab Results  Component Value Date   LDLCALC 61 09/12/2023

## 2024-03-04 NOTE — Assessment & Plan Note (Signed)
 Chronic Blood pressure well controlled BMP Continue Coreg  3.125 mg twice daily

## 2024-03-04 NOTE — Assessment & Plan Note (Signed)
 Chronic Controlled Continue Pepcid 20 mg twice daily

## 2024-03-04 NOTE — Assessment & Plan Note (Addendum)
 Chronic Stage IIIb Kidney function has improved Encouraged increased fluids Stressed importance of keeping sugars controlled Continue jardiance  25 mg daily BMP

## 2024-03-04 NOTE — Assessment & Plan Note (Signed)
 Chronic Inappropriate sinus tachycardia Following with cardiology Continue carvedilol  3.125 mg twice daily

## 2024-03-04 NOTE — Assessment & Plan Note (Signed)
 Chronic Associated with chronic kidney disease stage 3b  Lab Results  Component Value Date   HGBA1C 9.2 (H) 12/10/2023   Not controlled Check A1c today- Stressed diabetic diet Continue  jardiance  25 mg daily, glimepiride  4 mg daily, metformin  XR 1000 mg daily with breakfast

## 2024-03-04 NOTE — Assessment & Plan Note (Signed)
Chronic.  Controlled.  Continue sertraline 50 mg daily

## 2024-03-11 ENCOUNTER — Encounter: Payer: Self-pay | Admitting: Internal Medicine

## 2024-03-11 NOTE — Progress Notes (Deleted)
 Subjective:    Patient ID: Kathryn Logan, female    DOB: Nov 10, 1945, 78 y.o.   MRN: [id number]     HPI Tyger is here for follow up of her chronic medical problems.  Sugars -- jardiance  25 mg daily, glimepiride  4 mg daily, metformin  XR 1000 mg daily in the morning  ?ref to christy  Medications and allergies reviewed with patient and updated if appropriate.  Current Outpatient Medications on File Prior to Visit  Medication Sig Dispense Refill   ACCU-CHEK GUIDE TEST test strip USE AS DIRECTED TWICE DAILY 200 strip 1   Accu-Chek Softclix Lancets lancets 2 (two) times daily.     aspirin  EC 81 MG tablet Take 81 mg by mouth daily.      Biotin 5 MG TABS Take by mouth.     Blood Glucose Monitoring Suppl (ACCU-CHEK GUIDE ME) w/Device KIT      Blood Glucose Monitoring Suppl DEVI 1 each by Does not apply route as directed. May substitute to any manufacturer covered by patient's insurance. 1 kit 0   carvedilol  (COREG ) 3.125 MG tablet Take 1 tablet (3.125 mg total) by mouth 2 (two) times daily with a meal. 180 tablet 3   Continuous Glucose Receiver (DEXCOM G7 RECEIVER) DEVI UAD to monitor sugars   E11.9 1 each 0   Continuous Glucose Sensor (DEXCOM G7 SENSOR) MISC UAD to monitor sugars.   E11.9 3 each 5   empagliflozin  (JARDIANCE ) 25 MG TABS tablet Take 1 tablet (25 mg total) by mouth daily before breakfast. 30 tablet 5   famotidine  (PEPCID ) 20 MG tablet Take 1 tablet (20 mg total) by mouth 2 (two) times daily. 180 tablet 1   glimepiride  (AMARYL ) 4 MG tablet TAKE 1 TABLET BY MOUTH EVERY MORNING BEFORE BREAKFAST 90 tablet 1   loratadine  (CLARITIN ) 10 MG tablet Take 1 tablet (10 mg total) by mouth daily. 90 tablet 3   metFORMIN  (GLUCOPHAGE -XR) 500 MG 24 hr tablet Take 2 tablets (1,000 mg total) by mouth daily with breakfast.     mupirocin  ointment (BACTROBAN ) 2 % Apply 1 Application topically 2 (two) times daily. 30 g 0   rosuvastatin  (CRESTOR ) 40 MG tablet TAKE 1 TABLET BY MOUTH EVERY  DAY 90 tablet 3   sertraline  (ZOLOFT ) 50 MG tablet TAKE 1 TABLET(50 MG) BY MOUTH DAILY 30 tablet 0   No current facility-administered medications on file prior to visit.     Review of Systems     Objective:  There were no vitals filed for this visit. BP Readings from Last 3 Encounters:  12/10/23 120/78  09/12/23 122/78  06/15/23 110/78   Wt Readings from Last 3 Encounters:  12/10/23 119 lb (54 kg)  09/12/23 116 lb (52.6 kg)  09/04/23 113 lb (51.3 kg)   There is no height or weight on file to calculate BMI.    Physical Exam     Lab Results  Component Value Date   WBC 8.3 12/10/2023   HGB 12.4 12/10/2023   HCT 37.6 12/10/2023   PLT 261.0 12/10/2023   GLUCOSE 123 (H) 12/10/2023   CHOL 124 09/12/2023   TRIG 121.0 09/12/2023   HDL 39.00 (L) 09/12/2023   LDLDIRECT 53.0 11/10/2020   LDLCALC 61 09/12/2023   ALT 13 12/10/2023   AST 19 12/10/2023   NA 140 12/10/2023   K 4.5 12/10/2023   CL 109 12/10/2023   CREATININE 1.19 12/10/2023   BUN 39 (H) 12/10/2023   CO2 21 12/10/2023  TSH 0.657 04/28/2023   INR 1.38 12/01/2013   HGBA1C 9.2 (H) 12/10/2023   MICROALBUR 1.2 12/10/2023     Assessment & Plan:    See Problem List for Assessment and Plan of chronic medical problems.

## 2024-03-12 ENCOUNTER — Ambulatory Visit: Admitting: Internal Medicine

## 2024-03-12 DIAGNOSIS — I152 Hypertension secondary to endocrine disorders: Secondary | ICD-10-CM

## 2024-03-12 DIAGNOSIS — E1169 Type 2 diabetes mellitus with other specified complication: Secondary | ICD-10-CM

## 2024-03-12 DIAGNOSIS — E1122 Type 2 diabetes mellitus with diabetic chronic kidney disease: Secondary | ICD-10-CM

## 2024-03-12 DIAGNOSIS — N1832 Chronic kidney disease, stage 3b: Secondary | ICD-10-CM

## 2024-03-13 ENCOUNTER — Encounter: Payer: Self-pay | Admitting: Internal Medicine

## 2024-03-13 NOTE — Progress Notes (Signed)
 Subjective:    Patient ID: Kathryn Logan, female    DOB: [birth year], 78 y.o.   MRN: [id number]     HPI Kathryn Logan is here for follow up of her chronic medical problems.   Discussed the use of AI scribe software for clinical note transcription with the patient, who gave verbal consent to proceed.  History of Present Illness Kathryn Logan is a 78 year old female with type 2 diabetes who presents for follow-up of her blood sugar management.  She takes metformin  500 mg twice daily with breakfast and dinner. Her blood sugar levels have improved, with a recent morning reading of 134 mg/dL. She checks her blood sugar once daily, typically before breakfast. Her last A1c was 9.2% in July. She has not yet obtained a continuous glucose monitor, which was previously prescribed.  She discontinued Jardiance  and glimepiride  as her blood sugar levels improved. Her diet includes a protein shake for breakfast, and she often orders lunch via [employer], splitting the meal between lunch and dinner. She drinks diet Pepsi and water throughout the day. She remains active around the house but does not engage in formal exercise.  No symptoms of fever, cough, wheezing, shortness of breath, chest pain, palpitations, leg swelling, headaches, lightheadedness, depression, anxiety, or sleep disturbances. Her heartburn is well controlled with famotidine . She takes an over-the-counter allergy medication similar to Claritin .      Lab Results  Component Value Date   HGBA1C 9.2 (H) 12/10/2023     Medications and allergies reviewed with patient and updated if appropriate.  Current Outpatient Medications on File Prior to Visit  Medication Sig Dispense Refill   ACCU-CHEK GUIDE TEST test strip USE AS DIRECTED TWICE DAILY 200 strip 1   Accu-Chek Softclix Lancets lancets 2 (two) times daily.     aspirin  EC 81 MG tablet Take 81 mg by mouth daily.      Biotin 5 MG TABS Take by mouth.     Blood Glucose  Monitoring Suppl (ACCU-CHEK GUIDE ME) w/Device KIT      Blood Glucose Monitoring Suppl DEVI 1 each by Does not apply route as directed. May substitute to any manufacturer covered by patient's insurance. 1 kit 0   carvedilol  (COREG ) 3.125 MG tablet Take 1 tablet (3.125 mg total) by mouth 2 (two) times daily with a meal. 180 tablet 3   empagliflozin  (JARDIANCE ) 25 MG TABS tablet Take 1 tablet (25 mg total) by mouth daily before breakfast. 30 tablet 5   famotidine  (PEPCID ) 20 MG tablet Take 1 tablet (20 mg total) by mouth 2 (two) times daily. 180 tablet 1   glimepiride  (AMARYL ) 4 MG tablet TAKE 1 TABLET BY MOUTH EVERY MORNING BEFORE BREAKFAST 90 tablet 1   loratadine  (CLARITIN ) 10 MG tablet Take 1 tablet (10 mg total) by mouth daily. 90 tablet 3   metFORMIN  (GLUCOPHAGE -XR) 500 MG 24 hr tablet Take 2 tablets (1,000 mg total) by mouth daily with breakfast.     rosuvastatin  (CRESTOR ) 40 MG tablet TAKE 1 TABLET BY MOUTH EVERY DAY 90 tablet 3   sertraline  (ZOLOFT ) 50 MG tablet TAKE 1 TABLET(50 MG) BY MOUTH DAILY 30 tablet 0   No current facility-administered medications on file prior to visit.     Review of Systems  Constitutional:  Negative for fever.  Respiratory:  Negative for cough, shortness of breath and wheezing.   Cardiovascular:  Negative for chest pain, palpitations and leg swelling.  Gastrointestinal:  Gerd controlled  Neurological:  Negative for light-headedness and headaches.  Psychiatric/Behavioral:  Negative for dysphoric mood and sleep disturbance. The patient is not nervous/anxious.        Denies memory concerns       Objective:   Vitals:   03/14/24 1041  BP: 114/72  Pulse: 79  Temp: 98.1 F (36.7 C)  SpO2: 97%   BP Readings from Last 3 Encounters:  03/14/24 114/72  12/10/23 120/78  09/12/23 122/78   Wt Readings from Last 3 Encounters:  03/14/24 119 lb (54 kg)  12/10/23 119 lb (54 kg)  09/12/23 116 lb (52.6 kg)   Body mass index is 21.77 kg/m.     Physical Exam Constitutional:      General: She is not in acute distress.    Appearance: Normal appearance.  HENT:     Head: Normocephalic and atraumatic.  Eyes:     Conjunctiva/sclera: Conjunctivae normal.  Cardiovascular:     Rate and Rhythm: Normal rate and regular rhythm.     Heart sounds: Normal heart sounds.  Pulmonary:     Effort: Pulmonary effort is normal. No respiratory distress.     Breath sounds: Normal breath sounds. No wheezing.  Musculoskeletal:     Cervical back: Neck supple.     Right lower leg: No edema.     Left lower leg: No edema.  Lymphadenopathy:     Cervical: No cervical adenopathy.  Skin:    General: Skin is warm and dry.     Findings: No rash.  Neurological:     Mental Status: She is alert. Mental status is at baseline.  Psychiatric:        Mood and Affect: Mood normal.        Behavior: Behavior normal.        Lab Results  Component Value Date   WBC 8.3 12/10/2023   HGB 12.4 12/10/2023   HCT 37.6 12/10/2023   PLT 261.0 12/10/2023   GLUCOSE 123 (H) 12/10/2023   CHOL 124 09/12/2023   TRIG 121.0 09/12/2023   HDL 39.00 (L) 09/12/2023   LDLDIRECT 53.0 11/10/2020   LDLCALC 61 09/12/2023   ALT 13 12/10/2023   AST 19 12/10/2023   NA 140 12/10/2023   K 4.5 12/10/2023   CL 109 12/10/2023   CREATININE 1.19 12/10/2023   BUN 39 (H) 12/10/2023   CO2 21 12/10/2023   TSH 0.657 04/28/2023   INR 1.38 12/01/2013   HGBA1C 9.2 (H) 12/10/2023   MICROALBUR 1.2 12/10/2023     Assessment & Plan:    See Problem List for Assessment and Plan of chronic medical problems.

## 2024-03-14 ENCOUNTER — Ambulatory Visit (INDEPENDENT_AMBULATORY_CARE_PROVIDER_SITE_OTHER): Admitting: Internal Medicine

## 2024-03-14 VITALS — BP 114/72 | HR 79 | Temp 98.1°F | Ht 62.0 in | Wt 119.0 lb

## 2024-03-14 DIAGNOSIS — K219 Gastro-esophageal reflux disease without esophagitis: Secondary | ICD-10-CM

## 2024-03-14 DIAGNOSIS — E1159 Type 2 diabetes mellitus with other circulatory complications: Secondary | ICD-10-CM | POA: Diagnosis not present

## 2024-03-14 DIAGNOSIS — E785 Hyperlipidemia, unspecified: Secondary | ICD-10-CM

## 2024-03-14 DIAGNOSIS — N1832 Chronic kidney disease, stage 3b: Secondary | ICD-10-CM | POA: Diagnosis not present

## 2024-03-14 DIAGNOSIS — E1122 Type 2 diabetes mellitus with diabetic chronic kidney disease: Secondary | ICD-10-CM

## 2024-03-14 DIAGNOSIS — I152 Hypertension secondary to endocrine disorders: Secondary | ICD-10-CM

## 2024-03-14 DIAGNOSIS — Z23 Encounter for immunization: Secondary | ICD-10-CM | POA: Diagnosis not present

## 2024-03-14 DIAGNOSIS — I25119 Atherosclerotic heart disease of native coronary artery with unspecified angina pectoris: Secondary | ICD-10-CM | POA: Diagnosis not present

## 2024-03-14 DIAGNOSIS — I495 Sick sinus syndrome: Secondary | ICD-10-CM | POA: Diagnosis not present

## 2024-03-14 DIAGNOSIS — E1169 Type 2 diabetes mellitus with other specified complication: Secondary | ICD-10-CM | POA: Diagnosis not present

## 2024-03-14 DIAGNOSIS — F419 Anxiety disorder, unspecified: Secondary | ICD-10-CM | POA: Diagnosis not present

## 2024-03-14 DIAGNOSIS — F32A Depression, unspecified: Secondary | ICD-10-CM | POA: Diagnosis not present

## 2024-03-14 MED ORDER — DEXCOM G7 SENSOR MISC: 5 refills | Status: DC

## 2024-03-14 MED ORDER — METFORMIN HCL ER 500 MG PO TB24: 500.0000 mg | ORAL_TABLET | Freq: Two times a day (BID) | ORAL | 1 refills | Status: AC

## 2024-03-14 MED ORDER — SERTRALINE HCL 50 MG PO TABS: ORAL_TABLET | ORAL | 1 refills | Status: AC

## 2024-03-14 MED ORDER — DEXCOM G7 RECEIVER DEVI: 0 refills | Status: DC

## 2024-03-14 NOTE — Patient Instructions (Addendum)
   Flu immunization administered today.      Blood work was ordered.       Medications changes include :   None     Return in about 3 months (around 06/14/2024) for follow up.

## 2024-03-16 ENCOUNTER — Telehealth: Payer: Self-pay | Admitting: Internal Medicine

## 2024-03-16 NOTE — Assessment & Plan Note (Signed)
 Chronic Associated with diabetes Lab Results  Component Value Date   LDLCALC 61 09/12/2023    LDL well-controlled Continue rosuvastatin  40 mg daily

## 2024-03-16 NOTE — Assessment & Plan Note (Signed)
 Chronic Inappropriate sinus tachycardia Following with cardiology Continue carvedilol  3.125 mg twice daily

## 2024-03-16 NOTE — Assessment & Plan Note (Signed)
 Chronic Associated with chronic kidney disease stage 3b  Lab Results  Component Value Date   HGBA1C 9.2 (H) 12/10/2023   Not controlled Stressed diabetic diet Stopped jardiance  25 mg daily, glimepiride  4 mg daily because her sugars were good at home Taking metformin  XR 500 mg daily bid Will adjust medication based on A1c, cmp

## 2024-03-16 NOTE — Assessment & Plan Note (Signed)
Chronic.  Controlled.  Continue sertraline 50 mg daily

## 2024-03-16 NOTE — Assessment & Plan Note (Signed)
 Chronic Controlled Continue Pepcid 20 mg twice daily

## 2024-03-16 NOTE — Assessment & Plan Note (Signed)
 Chronic Blood pressure well controlled Cmp  Continue Coreg  3.125 mg twice daily

## 2024-03-16 NOTE — Assessment & Plan Note (Signed)
 Chronic Stage IIIb Kidney function has improved Encouraged increased fluids Stressed importance of keeping sugars controlled Not taking jardiance  25 mg daily - will likely restart but will check labs first cmp

## 2024-03-16 NOTE — Telephone Encounter (Signed)
 Please call her - I wanted her to have labs done when she was here last Friday - see if she can stop by the lab sometime the week.  We will adjust her medications based on her lab work.

## 2024-03-16 NOTE — Assessment & Plan Note (Signed)
 Chronic Denies any symptoms consistent with angina Continue aspirin  81 mg daily, carvedilol  3.125 mg twice daily, Crestor  40 mg daily  Lab Results  Component Value Date   LDLCALC 61 09/12/2023

## 2024-03-17 NOTE — Telephone Encounter (Signed)
 Attempted to reach patient but unable to leave VM.  My-chart sent

## 2024-03-18 ENCOUNTER — Ambulatory Visit: Admitting: Internal Medicine

## 2024-03-18 NOTE — Telephone Encounter (Signed)
 Attempted to reach patient again today and no way of leaving a VM.

## 2024-03-28 ENCOUNTER — Telehealth: Payer: Self-pay

## 2024-03-28 NOTE — Telephone Encounter (Signed)
 Spoke with patient today.  She was told to come back and get her labs done so medication adjustments could be made.  She will come by Tuesday to have them done.

## 2024-03-28 NOTE — Telephone Encounter (Signed)
 Copied from CRM #[id number]. Topic: Clinical - Medication Question >> Mar 28, 2024 11:00 AM Ashley R wrote: Reason for CRM: callback from [facility] - requested [phone] to clarify which medications should be continued after last visit. Not taking glimepiride  (AMARYL ) 4 MG tablet or empagliflozin  (JARDIANCE ) 25 MG TABS tablet - called pharmacy and stated no active rx for these.

## 2024-04-13 ENCOUNTER — Other Ambulatory Visit: Payer: Self-pay | Admitting: Internal Medicine

## 2024-04-13 DIAGNOSIS — E119 Type 2 diabetes mellitus without complications: Secondary | ICD-10-CM

## 2024-04-22 ENCOUNTER — Other Ambulatory Visit: Payer: Self-pay | Admitting: Internal Medicine

## 2024-04-24 MED ORDER — ROSUVASTATIN CALCIUM 40 MG PO TABS: ORAL_TABLET | ORAL | 0 refills | Status: AC

## 2024-04-29 ENCOUNTER — Other Ambulatory Visit: Payer: Self-pay | Admitting: Internal Medicine

## 2024-04-29 DIAGNOSIS — E1122 Type 2 diabetes mellitus with diabetic chronic kidney disease: Secondary | ICD-10-CM

## 2024-05-01 ENCOUNTER — Telehealth: Payer: Self-pay

## 2024-05-01 NOTE — Telephone Encounter (Signed)
 Copied from CRM #[id number]. Topic: General - Other >> May 01, 2024  4:18 PM Wess RAMAN wrote: Reason for CRM: [facility] would like the forms faxed back. They called to verify if the patient is on insulin  or nor. Forms have been faxed twice already.  Pharmacy: [id number] [place], [place] - [phone] AT [facility] OF [facility] & [address] [place] [address] Phone: [phone] Fax: [phone] Hours: Not open 24 hours

## 2024-05-02 ENCOUNTER — Telehealth: Payer: Self-pay | Admitting: *Deleted

## 2024-05-02 NOTE — Progress Notes (Signed)
 Care Guide Pharmacy Note  05/02/2024 Name: Kathryn Logan MRN: [id number] DOB: [birth year]  Referred By: Geofm Glade PARAS, MD Reason for referral: Complex Care Management (Outreach to schedule referral with pharmacist )   Kathryn Logan is a 78 y.o. year old female who is a primary care patient of Geofm Glade PARAS, MD.  Kathryn Logan was referred to the pharmacist for assistance related to: DMII  Successful contact was made with the patient to discuss pharmacy services including being ready for the pharmacist to call at least 5 minutes before the scheduled appointment time and to have medication bottles and any blood pressure readings ready for review. The patient agreed to meet with the pharmacist via telephone visit on 05/13/2024  Thedford Franks, CMA [facility], [facility] Guide Direct Dial: [phone]  Fax: [phone] Website: [email or web address]

## 2024-05-02 NOTE — Telephone Encounter (Signed)
 Form was refaxed to them again on yesterday with conformation received.

## 2024-05-13 ENCOUNTER — Other Ambulatory Visit (INDEPENDENT_AMBULATORY_CARE_PROVIDER_SITE_OTHER): Admitting: Pharmacist

## 2024-05-13 DIAGNOSIS — N1832 Chronic kidney disease, stage 3b: Secondary | ICD-10-CM

## 2024-05-13 DIAGNOSIS — E1122 Type 2 diabetes mellitus with diabetic chronic kidney disease: Secondary | ICD-10-CM

## 2024-05-13 NOTE — Progress Notes (Signed)
 "  05/13/2024 Name: Kathryn Logan MRN: [id number] DOB: Sep 19, 1945  Chief Complaint  Patient presents with   Diabetes   Medication Management    Kathryn Logan is a 78 y.o. year old female who presented for a telephone visit.   They were referred to the pharmacist by their PCP for assistance in managing diabetes.    Subjective:  Care Team: Primary Care Provider: Geofm Glade PARAS, MD ; Next Scheduled Visit: 06/11/24   Medication Access/Adherence  Current Pharmacy:  [facility] #[id number] [place], [place] - [address] AT [facility] OF [facility] & [address] [place] [address] Phone: [phone] Fax: [phone]   Patient reports affordability concerns with their medications: No  Patient reports access/transportation concerns to their pharmacy: No  Patient reports adherence concerns with their medications:  No     Diabetes:  Current medications: metformin  XR 500 mg twice daily Medications tried in the past: Jardiance  & glimepiride  (stopped due to BG looking better)  Current glucose readings: 163, 175, 189 fasting    Patient denies hypoglycemic s/sx including dizziness, shakiness, sweating. Patient denies hyperglycemic symptoms including polyuria, polydipsia, polyphagia, nocturia, neuropathy, blurred vision.  Current meal patterns:  - More sweet foods lately due to the holidays but has been trying to limit - Protein shake in the morning with meds  Macrovascular and Microvascular Risk Reduction:  Statin? yes (Rosuvastatin ); ACEi/ARB? no; therapy not indicated  Last urinary albumin /creatinine ratio:  Lab Results  Component Value Date   MICRALBCREAT 22.5 12/10/2023   Last eye exam:  Lab Results  Component Value Date   HMDIABEYEEXA No Retinopathy 12/21/2023   Last foot exam: 12/10/2023 Tobacco Use:  Tobacco Use: Low Risk (03/13/2024)   Patient History    Smoking Tobacco Use: Never    Smokeless Tobacco Use: Never     Passive Exposure: Never     Objective:  Lab Results  Component Value Date   HGBA1C 9.2 (H) 12/10/2023    Lab Results  Component Value Date   CREATININE 1.19 12/10/2023   BUN 39 (H) 12/10/2023   NA 140 12/10/2023   K 4.5 12/10/2023   CL 109 12/10/2023   CO2 21 12/10/2023    Lab Results  Component Value Date   CHOL 124 09/12/2023   HDL 39.00 (L) 09/12/2023   LDLCALC 61 09/12/2023   LDLDIRECT 53.0 11/10/2020   TRIG 121.0 09/12/2023   CHOLHDL 3 09/12/2023    Medications Reviewed Today     Reviewed by Merceda Lela SAUNDERS, RPH-CPP (Pharmacist) on 05/13/24 at [phone]  Med List Status: <None>   Medication Order Taking? Sig Documenting Provider Last Dose Status Informant  ACCU-CHEK GUIDE TEST test strip [id number]  USE AS DIRECTED TWICE DAILY Geofm Glade PARAS, MD  Active   Accu-Chek Softclix Lancets lancets [id number]  2 (two) times daily. [provider]  Active   aspirin  EC 81 MG tablet [id number]  Take 81 mg by mouth daily.  [provider]  Active Self, Pharmacy Records  Biotin 5 MG TABS [id number]  Take by mouth. [provider]  Active   Blood Glucose Monitoring Suppl (ACCU-CHEK GUIDE ME) w/Device KIT [id number]   [provider]  Active   Blood Glucose Monitoring Suppl DEVI [id number]  1 each by Does not apply route as directed. May substitute to any manufacturer covered by patient's insurance. Geofm Glade PARAS, MD  Active   carvedilol  (COREG ) 3.125 MG tablet [id number] Yes TAKE 1 TABLET(3.125  MG) BY MOUTH TWICE DAILY Mona Vinie BROCKS, MD  Active     Discontinued 05/13/24 [phone]     Discontinued 05/13/24 0923   empagliflozin  (JARDIANCE ) 25 MG TABS tablet [id number]  Take 1 tablet (25 mg total) by mouth daily before breakfast.  Patient not taking: Reported on 05/13/2024   Geofm Glade PARAS, MD  Active   famotidine  (PEPCID ) 20 MG tablet [id number]  Take 1 tablet (20 mg total) by mouth 2 (two) times daily. Geofm Glade PARAS, MD  Active   glimepiride  (AMARYL ) 4  MG tablet [id number]  TAKE 1 TABLET BY MOUTH EVERY MORNING BEFORE BREAKFAST  Patient not taking: Reported on 05/13/2024   Geofm Glade PARAS, MD  Active   loratadine  (CLARITIN ) 10 MG tablet [id number]  Take 1 tablet (10 mg total) by mouth daily. Geofm Glade PARAS, MD  Active Self, Pharmacy Records  metFORMIN  (GLUCOPHAGE -XR) 500 MG 24 hr tablet [id number] Yes Take 1 tablet (500 mg total) by mouth 2 (two) times daily with a meal. Geofm Glade PARAS, MD  Active   rosuvastatin  (CRESTOR ) 40 MG tablet [id number] Yes TAKE 1 TABLET BY MOUTH EVERY DAY Hilty, Vinie BROCKS, MD  Active   sertraline  (ZOLOFT ) 50 MG tablet [id number] Yes TAKE 1 TABLET(50 MG) BY MOUTH DAILY Burns, Glade PARAS, MD  Active               Assessment/Plan:   Diabetes: - Currently uncontrolled; goal A1c <8%. Cardiorenal risk reduction is optimized.. Blood pressure is at goal <130/80. LDL is at goal.  - Reviewed goal A1c, goal fasting (less than 150). Recommended to check glucose twice daily for 2 weeks - Recommend to continue metformin  XR 500 mg twice daily (unable to increase dose due to renal function). Will hold of on recommending Jaridance vs glimepiride  until review post-prandial BG readings.   Follow Up Plan: 1/13  Darrelyn Drum, PharmD, BCPS, CPP Clinical Pharmacist Practitioner [facility] [phone]    "

## 2024-05-13 NOTE — Patient Instructions (Signed)
 It was a pleasure speaking with you today!  Continue metformin . Check blood sugars in the morning and evening before dinner or bedtime for 2 weeks. We will review the readings and decide if medication changes are needed.  Feel free to call with any questions or concerns!  Darrelyn Drum, PharmD, BCPS, CPP Clinical Pharmacist Practitioner [place] [facility] [phone]

## 2024-05-16 ENCOUNTER — Emergency Department (HOSPITAL_COMMUNITY)
Admission: EM | Admit: 2024-05-16 | Discharge: 2024-05-17 | Disposition: A | Discharge status: Home / Self Care | Attending: Emergency Medicine | Admitting: Emergency Medicine

## 2024-05-16 ENCOUNTER — Other Ambulatory Visit: Payer: Self-pay

## 2024-05-16 ENCOUNTER — Encounter (HOSPITAL_COMMUNITY): Payer: Self-pay | Admitting: *Deleted

## 2024-05-16 DIAGNOSIS — Z79899 Other long term (current) drug therapy: Secondary | ICD-10-CM | POA: Diagnosis not present

## 2024-05-16 DIAGNOSIS — R748 Abnormal levels of other serum enzymes: Secondary | ICD-10-CM | POA: Diagnosis not present

## 2024-05-16 DIAGNOSIS — R739 Hyperglycemia, unspecified: Secondary | ICD-10-CM

## 2024-05-16 DIAGNOSIS — Z794 Long term (current) use of insulin: Secondary | ICD-10-CM | POA: Diagnosis not present

## 2024-05-16 DIAGNOSIS — Z7982 Long term (current) use of aspirin: Secondary | ICD-10-CM | POA: Insufficient documentation

## 2024-05-16 DIAGNOSIS — E1165 Type 2 diabetes mellitus with hyperglycemia: Secondary | ICD-10-CM | POA: Insufficient documentation

## 2024-05-16 DIAGNOSIS — Z7984 Long term (current) use of oral hypoglycemic drugs: Secondary | ICD-10-CM | POA: Insufficient documentation

## 2024-05-16 LAB — COMPREHENSIVE METABOLIC PANEL WITH GFR
ALT: 11 U/L (ref 0–44)
AST: 13 U/L — ABNORMAL LOW (ref 15–41)
Albumin: 4.3 g/dL (ref 3.5–5.0)
Alkaline Phosphatase: 85 U/L (ref 38–126)
Anion gap: 13 (ref 5–15)
BUN: 39 mg/dL — ABNORMAL HIGH (ref 8–23)
CO2: 21 mmol/L — ABNORMAL LOW (ref 22–32)
Calcium: 10.1 mg/dL (ref 8.9–10.3)
Chloride: 103 mmol/L (ref 98–111)
Creatinine, Ser: 1.28 mg/dL — ABNORMAL HIGH (ref 0.44–1.00)
GFR, Estimated: 43 mL/min — ABNORMAL LOW
Glucose, Bld: 412 mg/dL — ABNORMAL HIGH (ref 70–99)
Potassium: 4.4 mmol/L (ref 3.5–5.1)
Sodium: 136 mmol/L (ref 135–145)
Total Bilirubin: 0.4 mg/dL (ref 0.0–1.2)
Total Protein: 7.2 g/dL (ref 6.5–8.1)

## 2024-05-16 LAB — I-STAT VENOUS BLOOD GAS, ED
Acid-base deficit: 3 mmol/L — ABNORMAL HIGH (ref 0.0–2.0)
Bicarbonate: 19.6 mmol/L — ABNORMAL LOW (ref 20.0–28.0)
Calcium, Ion: 1.17 mmol/L (ref 1.15–1.40)
HCT: 34 % — ABNORMAL LOW (ref 36.0–46.0)
Hemoglobin: 11.6 g/dL — ABNORMAL LOW (ref 12.0–15.0)
O2 Saturation: 51 %
Potassium: 4.3 mmol/L (ref 3.5–5.1)
Sodium: 136 mmol/L (ref 135–145)
TCO2: 20 mmol/L — ABNORMAL LOW (ref 22–32)
pCO2, Ven: 28.2 mmHg — ABNORMAL LOW (ref 44–60)
pH, Ven: 7.449 — ABNORMAL HIGH (ref 7.25–7.43)
pO2, Ven: 25 mmHg — CL (ref 32–45)

## 2024-05-16 LAB — CBC WITH DIFFERENTIAL/PLATELET
Abs Immature Granulocytes: 0.04 K/uL (ref 0.00–0.07)
Basophils Absolute: 0 K/uL (ref 0.0–0.1)
Basophils Relative: 0 %
Eosinophils Absolute: 0.2 K/uL (ref 0.0–0.5)
Eosinophils Relative: 2 %
HCT: 35.8 % — ABNORMAL LOW (ref 36.0–46.0)
Hemoglobin: 11.8 g/dL — ABNORMAL LOW (ref 12.0–15.0)
Immature Granulocytes: 0 %
Lymphocytes Relative: 28 %
Lymphs Abs: 3 K/uL (ref 0.7–4.0)
MCH: 28.2 pg (ref 26.0–34.0)
MCHC: 33 g/dL (ref 30.0–36.0)
MCV: 85.6 fL (ref 80.0–100.0)
Monocytes Absolute: 0.7 K/uL (ref 0.1–1.0)
Monocytes Relative: 6 %
Neutro Abs: 6.7 K/uL (ref 1.7–7.7)
Neutrophils Relative %: 64 %
Platelets: 218 K/uL (ref 150–400)
RBC: 4.18 MIL/uL (ref 3.87–5.11)
RDW: 15.3 % (ref 11.5–15.5)
WBC: 10.7 K/uL — ABNORMAL HIGH (ref 4.0–10.5)
nRBC: 0 % (ref 0.0–0.2)

## 2024-05-16 LAB — CBG MONITORING, ED: Glucose-Capillary: 406 mg/dL — ABNORMAL HIGH (ref 70–99)

## 2024-05-16 LAB — BETA-HYDROXYBUTYRIC ACID: Beta-Hydroxybutyric Acid: 0.13 mmol/L (ref 0.05–0.27)

## 2024-05-16 LAB — LIPASE, BLOOD: Lipase: 60 U/L — ABNORMAL HIGH (ref 11–51)

## 2024-05-16 NOTE — ED Provider Triage Note (Signed)
 Emergency Medicine Provider Triage Evaluation Note  Kathryn Logan , a 79 y.o. female  was evaluated in triage.  Pt complains of hyperglycemia. Has been taking her metformin .  Blood sugars have been in the 400s in the last couple days usually in the low 100s.  Endorsing nausea and vomiting but no abdominal pain.  No cough or fever.  Review of Systems  Positive: See above Negative: See above  Physical Exam  BP (!) 151/85 (BP Location: Right Arm)   Pulse 98   Temp 98.2 F (36.8 C)   Resp 19   SpO2 98%  Gen:   Awake, no distress   Resp:  Normal effort  MSK:   Moves extremities without difficulty  Other:    Medical Decision Making  Medically screening exam initiated at 10:01 PM.  Appropriate orders placed.  MCKAELA HOWLEY was informed that the remainder of the evaluation will be completed by another provider, this initial triage assessment does not replace that evaluation, and the importance of remaining in the ED until their evaluation is complete.  Work up started   Lang Norleen POUR, PA-C 05/16/24 2202

## 2024-05-16 NOTE — ED Triage Notes (Signed)
 Pt arrive POV from home c/o having high blood sugar states she is taking a DM medication, but her blood sugar still spiking up with nausea and vomiting. Pt states her pcp encouraged her to come to ED.

## 2024-05-17 ENCOUNTER — Emergency Department (HOSPITAL_COMMUNITY)

## 2024-05-17 LAB — URINALYSIS, ROUTINE W REFLEX MICROSCOPIC
Bacteria, UA: NONE SEEN
Bilirubin Urine: NEGATIVE
Glucose, UA: >=500 mg/dL — AB
Hgb urine dipstick: NEGATIVE
Ketones, ur: NEGATIVE mg/dL
Leukocytes,Ua: NEGATIVE
Nitrite: NEGATIVE
Protein, ur: NEGATIVE mg/dL
Specific Gravity, Urine: 1.042 — ABNORMAL HIGH (ref 1.005–1.030)
pH: 5 (ref 5.0–8.0)

## 2024-05-17 LAB — CBG MONITORING, ED
Glucose-Capillary: 272 mg/dL — ABNORMAL HIGH (ref 70–99)
Glucose-Capillary: 330 mg/dL — ABNORMAL HIGH (ref 70–99)

## 2024-05-17 LAB — TROPONIN T, HIGH SENSITIVITY: Troponin T High Sensitivity: 19 ng/L (ref 0–19)

## 2024-05-17 MED ORDER — IOHEXOL 350 MG/ML SOLN
75.0000 mL | Freq: Once | INTRAVENOUS | Status: AC | PRN
  Administered 2024-05-17: 75 mL via INTRAVENOUS

## 2024-05-17 MED ORDER — LACTATED RINGERS IV BOLUS
1000.0000 mL | Freq: Once | INTRAVENOUS | Status: AC
  Administered 2024-05-17: 1000 mL via INTRAVENOUS

## 2024-05-17 NOTE — ED Provider Notes (Signed)
 " [place] EMERGENCY DEPARTMENT AT [facility] Provider Note   CSN: [id number] Arrival date & time: 05/16/24  2128     Patient presents with: Hyperglycemia   Kathryn Logan is a 79 y.o. female.   79 year old female with history of diabetes on metformin  presents ER today with hyperglycemia.  Patient has blood sugars normally run in the 100's and this morning hers was also around 140.  She states that her cat died today and she had to take it to the vet she is very stressed out and upset about it.  She states that she did not eat much all day but then she went home and her blood sugar was over 500 on the glucometer so she called her doctor's office who told her to come the ER for evaluation.  Denies any chest pain, back pain, abdominal pain, shortness of breath, fever, cough or any other recent illnesses.  No recent medication changes.   Hyperglycemia      Prior to Admission medications  Medication Sig Start Date End Date Taking? Authorizing Provider  ACCU-CHEK GUIDE TEST test strip USE AS DIRECTED TWICE DAILY 04/14/24   Geofm Glade PARAS, MD  Accu-Chek Softclix Lancets lancets 2 (two) times daily. 04/30/23   [provider]  aspirin  EC 81 MG tablet Take 81 mg by mouth daily.     [provider]  Biotin 5 MG TABS Take by mouth.    [provider]  Blood Glucose Monitoring Suppl (ACCU-CHEK GUIDE ME) w/Device KIT  04/30/23   [provider]  Blood Glucose Monitoring Suppl DEVI 1 each by Does not apply route as directed. May substitute to any manufacturer covered by patient's insurance. 04/30/23   Geofm Glade PARAS, MD  carvedilol  (COREG ) 3.125 MG tablet TAKE 1 TABLET(3.125 MG) BY MOUTH TWICE DAILY 04/17/24   Hilty, Vinie BROCKS, MD  empagliflozin  (JARDIANCE ) 25 MG TABS tablet Take 1 tablet (25 mg total) by mouth daily before breakfast. Patient not taking: Reported on 05/13/2024 10/02/23   Geofm Glade PARAS, MD  famotidine  (PEPCID ) 20 MG tablet Take 1  tablet (20 mg total) by mouth 2 (two) times daily. 11/12/23   Geofm Glade PARAS, MD  glimepiride  (AMARYL ) 4 MG tablet TAKE 1 TABLET BY MOUTH EVERY MORNING BEFORE BREAKFAST Patient not taking: Reported on 05/13/2024 02/27/24   Geofm Glade PARAS, MD  loratadine  (CLARITIN ) 10 MG tablet Take 1 tablet (10 mg total) by mouth daily. 02/22/16   Geofm Glade PARAS, MD  metFORMIN  (GLUCOPHAGE -XR) 500 MG 24 hr tablet Take 1 tablet (500 mg total) by mouth 2 (two) times daily with a meal. 03/14/24   Burns, Glade PARAS, MD  rosuvastatin  (CRESTOR ) 40 MG tablet TAKE 1 TABLET BY MOUTH EVERY DAY 04/24/24   Hilty, Vinie BROCKS, MD  sertraline  (ZOLOFT ) 50 MG tablet TAKE 1 TABLET(50 MG) BY MOUTH DAILY 03/14/24   Geofm Glade PARAS, MD    Allergies: Patient has no known allergies.    Review of Systems  Updated Vital Signs BP 126/66 (BP Location: Left Arm)   Pulse 88   Temp 98.4 F (36.9 C) (Oral)   Resp 16   Ht 5' 2 (1.575 m)   Wt 54 kg   SpO2 100%   BMI 21.77 kg/m   Physical Exam Vitals and nursing note reviewed.  Constitutional:      Appearance: She is well-developed.  HENT:     Head: Normocephalic and atraumatic.  Cardiovascular:     Rate and Rhythm: Normal  rate and regular rhythm.  Pulmonary:     Effort: No respiratory distress.     Breath sounds: No stridor.  Abdominal:     General: There is no distension.  Musculoskeletal:     Cervical back: Normal range of motion.  Neurological:     Mental Status: She is alert.     (all labs ordered are listed, but only abnormal results are displayed) Labs Reviewed  CBC WITH DIFFERENTIAL/PLATELET - Abnormal; Notable for the following components:      Result Value   WBC 10.7 (*)    Hemoglobin 11.8 (*)    HCT 35.8 (*)    All other components within normal limits  COMPREHENSIVE METABOLIC PANEL WITH GFR - Abnormal; Notable for the following components:   CO2 21 (*)    Glucose, Bld 412 (*)    BUN 39 (*)    Creatinine, Ser 1.28 (*)    AST 13 (*)    GFR, Estimated 43  (*)    All other components within normal limits  LIPASE, BLOOD - Abnormal; Notable for the following components:   Lipase 60 (*)    All other components within normal limits  URINALYSIS, ROUTINE W REFLEX MICROSCOPIC - Abnormal; Notable for the following components:   Specific Gravity, Urine 1.042 (*)    Glucose, UA >=500 (*)    All other components within normal limits  I-STAT VENOUS BLOOD GAS, ED - Abnormal; Notable for the following components:   pH, Ven 7.449 (*)    pCO2, Ven 28.2 (*)    pO2, Ven 25 (*)    Bicarbonate 19.6 (*)    TCO2 20 (*)    Acid-base deficit 3.0 (*)    HCT 34.0 (*)    Hemoglobin 11.6 (*)    All other components within normal limits  CBG MONITORING, ED - Abnormal; Notable for the following components:   Glucose-Capillary 406 (*)    All other components within normal limits  CBG MONITORING, ED - Abnormal; Notable for the following components:   Glucose-Capillary 330 (*)    All other components within normal limits  CBG MONITORING, ED - Abnormal; Notable for the following components:   Glucose-Capillary 272 (*)    All other components within normal limits  BETA-HYDROXYBUTYRIC ACID  TROPONIN T, HIGH SENSITIVITY    EKG: EKG Interpretation Date/Time:  Saturday May 17 2024 01:34:27 EST Ventricular Rate:  84 PR Interval:  131 QRS Duration:  94 QT Interval:  376 QTC Calculation: 445 R Axis:   44  Text Interpretation: Sinus rhythm Baseline wander Compared with prior EKG from 06/13/2023 Confirmed by Gennaro Bouchard ([id number]) on 05/18/2024 2:27:53 PM  Radiology: No results found.    Procedures   Medications Ordered in the ED  lactated ringers  bolus 1,000 mL (0 mLs Intravenous Stopped 05/17/24 0412)  iohexol  (OMNIPAQUE ) 350 MG/ML injection 75 mL (75 mLs Intravenous Contrast Given 05/17/24 0219)                                    Medical Decision Making Amount and/or Complexity of Data Reviewed Labs: ordered. Radiology: ordered. ECG/medicine tests:  ordered.  Risk Prescription drug management.   Overall suspect likely stress-induced hyperglycemia.  Low suspicion for ACS, CVA as causes.  She is normally pretty well-controlled so do not think she needs to be started on insulin .  Will give her some fluids.  Blood sugars improving but still has not  made urine since she has been here so we will give another liter of fluids.  CT scan done since her lipase is slightly elevated in the setting of hyperglycemia thought was prudent to rule out pancreatitis or any other abdominal pathology.  This was reassuring.  Still pending urine and then likely discharge.   Urine ok. Blood sugars improving. No DKA. Stable for d/c.   Final diagnoses:  Hyperglycemia    ED Discharge Orders     None          Mylea Roarty, Selinda, MD 05/22/24 0559  "

## 2024-05-21 ENCOUNTER — Inpatient Hospital Stay: Admission: RE | Admit: 2024-05-21 | Source: Ambulatory Visit

## 2024-05-27 ENCOUNTER — Other Ambulatory Visit: Admitting: Pharmacist

## 2024-05-27 DIAGNOSIS — N1832 Chronic kidney disease, stage 3b: Secondary | ICD-10-CM

## 2024-05-27 NOTE — Patient Instructions (Addendum)
 It was a pleasure speaking with you today!  Please check your blood glucose 2 hours after your biggest meal and maintain a log to discuss at our next visit on 1/27. Continue to also take your fasting glucose in the morning prior to your first meal.  Feel free to call with any questions or concerns!  Dionicia Canavan, PharmD, RPh PGY1 Acute Care Pharmacy Resident [facility]  Darrelyn Drum, PharmD, BCPS, CPP Clinical Pharmacist Practitioner [place] [facility] at [facility] [phone]

## 2024-05-27 NOTE — Progress Notes (Signed)
" ° °  05/27/2024 Name: Kathryn Logan MRN: [id number] DOB: 1945/10/17  Chief Complaint  Patient presents with   Diabetes   Medication Management    ROXANNA Logan is a 79 y.o. year old female who presented for a telephone visit.   They were referred to the pharmacist by their PCP for assistance in managing diabetes.    Subjective:  Care Team: Primary Care Provider: Geofm Glade PARAS, MD ; Next Scheduled Visit: 06/11/24   Medication Access/Adherence  Current Pharmacy:  [id number] [address], [place] - [address] AT [facility] OF [facility] & [address] [place] [address] Phone: [phone] Fax: [phone]   Patient reports affordability concerns with their medications: No  Patient reports access/transportation concerns to their pharmacy: No  Patient reports adherence concerns with their medications:  No     Diabetes:  Current medications: metformin  XR 500 mg twice daily Medications tried in the past: Jardiance  & glimepiride  (stopped due to BG looking better)  Current glucose readings: 130-150 fasting, no post-prandial BG readings  Patient reports stress-induced hyperglycemia event on 1/3 requiring ED visit. She reports her glucose running high around 400s for a few days following this event, but has since stabilized back to baseline.    Macrovascular and Microvascular Risk Reduction:  Statin? yes (Rosuvastatin ); ACEi/ARB? no; therapy not indicated  Last urinary albumin /creatinine ratio:  Lab Results  Component Value Date   MICRALBCREAT 22.5 12/10/2023   Last eye exam:  Lab Results  Component Value Date   HMDIABEYEEXA No Retinopathy 12/21/2023   Last foot exam: 12/10/2023 Tobacco Use:  Tobacco Use: Low Risk (05/16/2024)   Patient History    Smoking Tobacco Use: Never    Smokeless Tobacco Use: Never    Passive Exposure: Never     Objective:  Lab Results  Component Value Date   HGBA1C 9.2 (H) 12/10/2023     Lab Results  Component Value Date   CREATININE 1.28 (H) 05/16/2024   BUN 39 (H) 05/16/2024   NA 136 05/16/2024   K 4.3 05/16/2024   CL 103 05/16/2024   CO2 21 (L) 05/16/2024    Lab Results  Component Value Date   CHOL 124 09/12/2023   HDL 39.00 (L) 09/12/2023   LDLCALC 61 09/12/2023   LDLDIRECT 53.0 11/10/2020   TRIG 121.0 09/12/2023   CHOLHDL 3 09/12/2023    Medications Reviewed Today   Medications were not reviewed in this encounter       Assessment/Plan:   Diabetes: - Currently uncontrolled; goal A1c <8%. Cardiorenal risk reduction is optimized. Blood pressure is at goal <130/80. LDL is at goal.  - Reviewed goal A1c, goal fasting (less than 150). Recommended to also check glucose 2 hours after largest meal of the day - Recommend to continue metformin  XR 500 mg twice daily (unable to increase dose due to renal function).  - Waiting to review post-prandial readings to determine best option for pharmacotherapy, if needed  Follow Up Plan: 1/27  Darrelyn Drum, PharmD, BCPS, CPP Clinical Pharmacist Practitioner [facility] [phone]    "

## 2024-06-02 ENCOUNTER — Other Ambulatory Visit: Payer: Self-pay | Admitting: Internal Medicine

## 2024-06-08 ENCOUNTER — Encounter: Payer: Self-pay | Admitting: Internal Medicine

## 2024-06-08 NOTE — Patient Instructions (Addendum)
" ° ° ° ° °  Blood work was ordered.       Medications changes include :   None    A referral was ordered and someone will call you to schedule an appointment.     Return in about 3 months (around 09/09/2024) for follow up.  "

## 2024-06-08 NOTE — Progress Notes (Unsigned)
 "     Subjective:    Patient ID: Kathryn Logan, female    DOB: Apr 07, 1946, 79 y.o.   MRN: [id number]     HPI [name] is here for follow up of her chronic medical problems.  ED 1/2 with hyperglycemia - sugars at home in the 500's.   Trop negative.  Ct ab/p negative ( done for elevated lipase).  Low suspicion of ACS, CVA.  Dx with stress induced hyperglycemia.      Medications and allergies reviewed with patient and updated if appropriate.  Medications Ordered Prior to Encounter[1]   Review of Systems     Objective:  There were no vitals filed for this visit. BP Readings from Last 3 Encounters:  05/17/24 126/66  03/14/24 114/72  12/10/23 120/78   Wt Readings from Last 3 Encounters:  05/16/24 119 lb 0.8 oz (54 kg)  03/14/24 119 lb (54 kg)  12/10/23 119 lb (54 kg)   There is no height or weight on file to calculate BMI.    Physical Exam     Lab Results  Component Value Date   WBC 10.7 (H) 05/16/2024   HGB 11.6 (L) 05/16/2024   HCT 34.0 (L) 05/16/2024   PLT 218 05/16/2024   GLUCOSE 412 (H) 05/16/2024   CHOL 124 09/12/2023   TRIG 121.0 09/12/2023   HDL 39.00 (L) 09/12/2023   LDLDIRECT 53.0 11/10/2020   LDLCALC 61 09/12/2023   ALT 11 05/16/2024   AST 13 (L) 05/16/2024   NA 136 05/16/2024   K 4.3 05/16/2024   CL 103 05/16/2024   CREATININE 1.28 (H) 05/16/2024   BUN 39 (H) 05/16/2024   CO2 21 (L) 05/16/2024   TSH 0.657 04/28/2023   INR 1.38 12/01/2013   HGBA1C 9.2 (H) 12/10/2023   MICROALBUR 1.2 12/10/2023     Assessment & Plan:    See Problem List for Assessment and Plan of chronic medical problems.       [1]  Current Outpatient Medications on File Prior to Visit  Medication Sig Dispense Refill   ACCU-CHEK GUIDE TEST test strip USE AS DIRECTED TWICE DAILY 200 strip 1   Accu-Chek Softclix Lancets lancets USE AS DIRECTED TWICE DAILY 100 each 5   aspirin  EC 81 MG tablet Take 81 mg by mouth daily.      Biotin 5 MG TABS Take by mouth.      Blood Glucose Monitoring Suppl (ACCU-CHEK GUIDE ME) w/Device KIT      Blood Glucose Monitoring Suppl DEVI 1 each by Does not apply route as directed. May substitute to any manufacturer covered by patient's insurance. 1 kit 0   carvedilol  (COREG ) 3.125 MG tablet TAKE 1 TABLET(3.125 MG) BY MOUTH TWICE DAILY 180 tablet 0   empagliflozin  (JARDIANCE ) 25 MG TABS tablet Take 1 tablet (25 mg total) by mouth daily before breakfast. (Patient not taking: Reported on 05/13/2024) 30 tablet 5   famotidine  (PEPCID ) 20 MG tablet Take 1 tablet (20 mg total) by mouth 2 (two) times daily. 180 tablet 1   glimepiride  (AMARYL ) 4 MG tablet TAKE 1 TABLET BY MOUTH EVERY MORNING BEFORE BREAKFAST (Patient not taking: Reported on 05/13/2024) 90 tablet 1   loratadine  (CLARITIN ) 10 MG tablet Take 1 tablet (10 mg total) by mouth daily. 90 tablet 3   metFORMIN  (GLUCOPHAGE -XR) 500 MG 24 hr tablet Take 1 tablet (500 mg total) by mouth 2 (two) times daily with a meal. 180 tablet 1   rosuvastatin  (CRESTOR ) 40 MG tablet TAKE 1 TABLET  BY MOUTH EVERY DAY 90 tablet 0   sertraline  (ZOLOFT ) 50 MG tablet TAKE 1 TABLET(50 MG) BY MOUTH DAILY 90 tablet 1   No current facility-administered medications on file prior to visit.   "

## 2024-06-10 ENCOUNTER — Other Ambulatory Visit

## 2024-06-11 ENCOUNTER — Ambulatory Visit: Admitting: Internal Medicine

## 2024-06-11 DIAGNOSIS — F419 Anxiety disorder, unspecified: Secondary | ICD-10-CM

## 2024-06-11 DIAGNOSIS — E1169 Type 2 diabetes mellitus with other specified complication: Secondary | ICD-10-CM

## 2024-06-11 DIAGNOSIS — E1122 Type 2 diabetes mellitus with diabetic chronic kidney disease: Secondary | ICD-10-CM

## 2024-06-11 DIAGNOSIS — I25119 Atherosclerotic heart disease of native coronary artery with unspecified angina pectoris: Secondary | ICD-10-CM

## 2024-06-11 DIAGNOSIS — N1832 Chronic kidney disease, stage 3b: Secondary | ICD-10-CM

## 2024-06-11 DIAGNOSIS — E559 Vitamin D deficiency, unspecified: Secondary | ICD-10-CM

## 2024-06-11 DIAGNOSIS — I152 Hypertension secondary to endocrine disorders: Secondary | ICD-10-CM

## 2024-06-18 ENCOUNTER — Other Ambulatory Visit: Admitting: Pharmacist

## 2024-06-18 DIAGNOSIS — E1122 Type 2 diabetes mellitus with diabetic chronic kidney disease: Secondary | ICD-10-CM

## 2024-06-18 MED ORDER — GLIMEPIRIDE 4 MG PO TABS: 4.0000 mg | ORAL_TABLET | Freq: Every day | ORAL | 0 refills | Status: AC

## 2024-06-18 NOTE — Progress Notes (Signed)
 "  06/18/2024 Name: Kathryn Logan MRN: [id number] DOB: 1945/07/22  Chief Complaint  Patient presents with   Diabetes   Medication Management    Kathryn Logan is a 79 y.o. year old female who presented for a telephone visit.   They were referred to the pharmacist by their PCP for assistance in managing diabetes.    Subjective:  Care Team: Primary Care Provider: Geofm Glade PARAS, MD ; Next Scheduled Visit: 06/25/24   Medication Access/Adherence  Current Pharmacy:  [facility] #[id number] [address], [place] - [address] AT [facility] OF [facility] & [address] [place] [address] Phone: [phone] Fax: [phone]   Patient reports affordability concerns with their medications: No  Patient reports access/transportation concerns to their pharmacy: No  Patient reports adherence concerns with their medications:  No     Diabetes:  Current medications: metformin  XR 500 mg twice daily Medications tried in the past: Jardiance  (unaffordable) & glimepiride  (stopped due to BG looking better)  Current glucose readings: 218 this morning, typically over 200s lately fasting. Has not check post-prandial  Patient reports stress-induced hyperglycemia event on 1/3 requiring ED visit. She reports her glucose running high around 400s for a few days following this event, but has since stabilized back to baseline.    Macrovascular and Microvascular Risk Reduction:  Statin? yes (Rosuvastatin ); ACEi/ARB? no; therapy not indicated  Last urinary albumin /creatinine ratio:  Lab Results  Component Value Date   MICRALBCREAT 22.5 12/10/2023   Last eye exam:  Lab Results  Component Value Date   HMDIABEYEEXA No Retinopathy 12/21/2023   Last foot exam: 12/10/2023 Tobacco Use:  Tobacco Use: Low Risk (06/08/2024)   Patient History    Smoking Tobacco Use: Never    Smokeless Tobacco Use: Never    Passive Exposure: Never     Objective:  Lab Results   Component Value Date   HGBA1C 9.2 (H) 12/10/2023    Lab Results  Component Value Date   CREATININE 1.28 (H) 05/16/2024   BUN 39 (H) 05/16/2024   NA 136 05/16/2024   K 4.3 05/16/2024   CL 103 05/16/2024   CO2 21 (L) 05/16/2024    Lab Results  Component Value Date   CHOL 124 09/12/2023   HDL 39.00 (L) 09/12/2023   LDLCALC 61 09/12/2023   LDLDIRECT 53.0 11/10/2020   TRIG 121.0 09/12/2023   CHOLHDL 3 09/12/2023    Medications Reviewed Today     Reviewed by Merceda Lela SAUNDERS, RPH-CPP (Pharmacist) on 06/18/24 at 1008  Med List Status: <None>   Medication Order Taking? Sig Documenting Provider Last Dose Status Informant  ACCU-CHEK GUIDE TEST test strip [id number]  USE AS DIRECTED TWICE DAILY Geofm Glade PARAS, MD  Active   Accu-Chek Softclix Lancets lancets [id number]  USE AS DIRECTED TWICE DAILY Geofm Glade PARAS, MD  Active   aspirin  EC 81 MG tablet [id number]  Take 81 mg by mouth daily.  [provider]  Active Self, Pharmacy Records  Biotin 5 MG TABS [id number]  Take by mouth. [provider]  Active   Blood Glucose Monitoring Suppl (ACCU-CHEK GUIDE ME) w/Device KIT [id number]   [provider]  Active   Blood Glucose Monitoring Suppl DEVI [id number]  1 each by Does not apply route as directed. May substitute to any manufacturer covered by patient's insurance. Geofm Glade PARAS, MD  Active   carvedilol  (COREG ) 3.125 MG tablet [id number]  TAKE 1 TABLET(3.125 MG) BY MOUTH  TWICE DAILY Hilty, Vinie BROCKS, MD  Active   empagliflozin  (JARDIANCE ) 25 MG TABS tablet [id number]  Take 1 tablet (25 mg total) by mouth daily before breakfast.  Patient not taking: Reported on 06/18/2024   Geofm Glade PARAS, MD  Active   famotidine  (PEPCID ) 20 MG tablet [id number]  Take 1 tablet (20 mg total) by mouth 2 (two) times daily. Geofm Glade PARAS, MD  Active   glimepiride  (AMARYL ) 4 MG tablet [id number]  TAKE 1 TABLET BY MOUTH EVERY MORNING BEFORE BREAKFAST  Patient not taking: Reported on 06/18/2024    Geofm Glade PARAS, MD  Active   loratadine  (CLARITIN ) 10 MG tablet [id number]  Take 1 tablet (10 mg total) by mouth daily. Geofm Glade PARAS, MD  Active Self, Pharmacy Records  metFORMIN  (GLUCOPHAGE -XR) 500 MG 24 hr tablet [id number] Yes Take 1 tablet (500 mg total) by mouth 2 (two) times daily with a meal. Geofm Glade PARAS, MD  Active   rosuvastatin  (CRESTOR ) 40 MG tablet [id number]  TAKE 1 TABLET BY MOUTH EVERY DAY Hilty, Vinie BROCKS, MD  Active   sertraline  (ZOLOFT ) 50 MG tablet [id number]  TAKE 1 TABLET(50 MG) BY MOUTH DAILY Burns, Glade PARAS, MD  Active               Assessment/Plan:   Diabetes: - Currently uncontrolled; goal A1c <8%. Cardiorenal risk reduction is optimized. Blood pressure is at goal <130/80. LDL is at goal.  - Reviewed goal A1c, goal fasting (less than 150). Recommended to also check glucose 2 hours after largest meal of the day - Recommend to continue metformin  XR 500 mg twice daily (unable to increase dose due to renal function).  - Since fasting have been higher, recommended patient restart glimepiride  4 mg daily - May need to apply for PAP if needing to add SGLT2  Follow Up Plan: 2/18  Darrelyn Drum, PharmD, BCPS, CPP Clinical Pharmacist Practitioner [facility] [phone]    "

## 2024-06-18 NOTE — Patient Instructions (Signed)
 It was a pleasure speaking with you today!  Restart glimepiride  4 mg daily with breakfast.  Check blood sugars in the morning and in the afternoon, at least 2 hours after lunch.  I will check back in with you in 2 weeks to review blood sugar readings.  Feel free to call with any questions or concerns!  Darrelyn Drum, PharmD, BCPS, CPP Clinical Pharmacist Practitioner [facility] [phone]

## 2024-06-25 ENCOUNTER — Ambulatory Visit: Admitting: Internal Medicine

## 2024-06-27 ENCOUNTER — Ambulatory Visit: Admitting: Internal Medicine

## 2024-07-02 ENCOUNTER — Other Ambulatory Visit

## 2024-09-12 ENCOUNTER — Ambulatory Visit

## 2024-09-12 ENCOUNTER — Encounter: Admitting: Internal Medicine
